# Patient Record
Sex: Female | Born: 1935 | Race: White | Hispanic: No | Marital: Married | Smoking: Former smoker
Health system: Southern US, Community
[De-identification: ages and names within clinical notes are randomized; demographics above are authoritative.]

## PROBLEM LIST (undated history)

## (undated) DIAGNOSIS — K589 Irritable bowel syndrome without diarrhea: Secondary | ICD-10-CM

## (undated) DIAGNOSIS — M353 Polymyalgia rheumatica: Secondary | ICD-10-CM

## (undated) DIAGNOSIS — R002 Palpitations: Secondary | ICD-10-CM

## (undated) DIAGNOSIS — G8929 Other chronic pain: Secondary | ICD-10-CM

## (undated) DIAGNOSIS — I493 Ventricular premature depolarization: Secondary | ICD-10-CM

## (undated) DIAGNOSIS — K5792 Diverticulitis of intestine, part unspecified, without perforation or abscess without bleeding: Secondary | ICD-10-CM

## (undated) DIAGNOSIS — F17200 Nicotine dependence, unspecified, uncomplicated: Secondary | ICD-10-CM

## (undated) DIAGNOSIS — E785 Hyperlipidemia, unspecified: Secondary | ICD-10-CM

## (undated) DIAGNOSIS — I471 Supraventricular tachycardia, unspecified: Secondary | ICD-10-CM

## (undated) DIAGNOSIS — I10 Essential (primary) hypertension: Secondary | ICD-10-CM

## (undated) HISTORY — DX: Irritable bowel syndrome, unspecified: K58.9

## (undated) HISTORY — DX: Palpitations: R00.2

## (undated) HISTORY — PX: REPLACEMENT TOTAL KNEE: SUR1224

## (undated) HISTORY — DX: Polymyalgia rheumatica: M35.3

## (undated) HISTORY — PX: TONSILLECTOMY: SUR1361

## (undated) HISTORY — DX: Essential (primary) hypertension: I10

## (undated) HISTORY — DX: Hyperlipidemia, unspecified: E78.5

## (undated) HISTORY — DX: Nicotine dependence, unspecified, uncomplicated: F17.200

## (undated) HISTORY — DX: Supraventricular tachycardia: I47.1

## (undated) HISTORY — DX: Diverticulitis of intestine, part unspecified, without perforation or abscess without bleeding: K57.92

## (undated) HISTORY — PX: WRIST FRACTURE SURGERY: SHX121

## (undated) HISTORY — DX: Supraventricular tachycardia, unspecified: I47.10

## (undated) HISTORY — PX: NASAL SINUS SURGERY: SHX719

## (undated) HISTORY — DX: Other chronic pain: G89.29

## (undated) HISTORY — DX: Ventricular premature depolarization: I49.3

## (undated) HISTORY — PX: ABDOMINAL HYSTERECTOMY: SHX81

---

## 1999-06-12 ENCOUNTER — Other Ambulatory Visit: Admission: RE | Admit: 1999-06-12 | Discharge: 1999-06-12 | Payer: Self-pay | Admitting: Family Medicine

## 1999-06-15 ENCOUNTER — Encounter: Payer: Self-pay | Admitting: Family Medicine

## 1999-06-15 LAB — CONVERTED CEMR LAB: Pap Smear: NORMAL

## 1999-06-23 ENCOUNTER — Encounter: Payer: Self-pay | Admitting: Family Medicine

## 1999-06-23 ENCOUNTER — Encounter: Admission: RE | Admit: 1999-06-23 | Discharge: 1999-06-23 | Payer: Self-pay | Admitting: Family Medicine

## 2000-06-24 ENCOUNTER — Encounter: Payer: Self-pay | Admitting: Family Medicine

## 2000-06-24 ENCOUNTER — Encounter: Admission: RE | Admit: 2000-06-24 | Discharge: 2000-06-24 | Payer: Self-pay | Admitting: Family Medicine

## 2000-09-21 ENCOUNTER — Encounter: Payer: Self-pay | Admitting: Family Medicine

## 2000-09-21 ENCOUNTER — Encounter: Admission: RE | Admit: 2000-09-21 | Discharge: 2000-09-21 | Payer: Self-pay | Admitting: Family Medicine

## 2001-02-15 ENCOUNTER — Emergency Department (HOSPITAL_COMMUNITY): Admission: EM | Admit: 2001-02-15 | Discharge: 2001-02-15 | Payer: Self-pay | Admitting: *Deleted

## 2001-06-30 ENCOUNTER — Encounter: Admission: RE | Admit: 2001-06-30 | Discharge: 2001-06-30 | Payer: Self-pay | Admitting: Family Medicine

## 2001-06-30 ENCOUNTER — Encounter: Payer: Self-pay | Admitting: Family Medicine

## 2002-07-05 ENCOUNTER — Encounter: Admission: RE | Admit: 2002-07-05 | Discharge: 2002-07-05 | Payer: Self-pay | Admitting: Family Medicine

## 2002-07-05 ENCOUNTER — Encounter: Payer: Self-pay | Admitting: Family Medicine

## 2003-07-09 ENCOUNTER — Encounter: Admission: RE | Admit: 2003-07-09 | Discharge: 2003-07-09 | Payer: Self-pay | Admitting: Family Medicine

## 2003-11-29 ENCOUNTER — Encounter: Admission: RE | Admit: 2003-11-29 | Discharge: 2003-11-29 | Payer: Self-pay | Admitting: Gastroenterology

## 2004-01-08 ENCOUNTER — Encounter: Admission: RE | Admit: 2004-01-08 | Discharge: 2004-01-08 | Payer: Self-pay | Admitting: Family Medicine

## 2004-05-30 ENCOUNTER — Ambulatory Visit (HOSPITAL_COMMUNITY): Admission: RE | Admit: 2004-05-30 | Discharge: 2004-05-30 | Payer: Self-pay | Admitting: Orthopaedic Surgery

## 2004-07-08 ENCOUNTER — Ambulatory Visit: Payer: Self-pay | Admitting: Family Medicine

## 2004-09-02 ENCOUNTER — Encounter: Admission: RE | Admit: 2004-09-02 | Discharge: 2004-09-02 | Payer: Self-pay | Admitting: Family Medicine

## 2004-09-02 ENCOUNTER — Ambulatory Visit: Payer: Self-pay | Admitting: Family Medicine

## 2005-09-30 ENCOUNTER — Emergency Department (HOSPITAL_COMMUNITY): Admission: EM | Admit: 2005-09-30 | Discharge: 2005-09-30 | Payer: Self-pay | Admitting: Family Medicine

## 2005-10-06 ENCOUNTER — Ambulatory Visit: Payer: Self-pay | Admitting: Family Medicine

## 2005-11-24 ENCOUNTER — Ambulatory Visit: Payer: Self-pay | Admitting: Family Medicine

## 2005-12-13 ENCOUNTER — Ambulatory Visit: Payer: Self-pay | Admitting: Gastroenterology

## 2006-01-26 ENCOUNTER — Ambulatory Visit (HOSPITAL_COMMUNITY): Admission: RE | Admit: 2006-01-26 | Discharge: 2006-01-27 | Payer: Self-pay | Admitting: General Surgery

## 2006-03-01 ENCOUNTER — Ambulatory Visit: Payer: Self-pay | Admitting: Family Medicine

## 2006-04-12 ENCOUNTER — Ambulatory Visit: Payer: Self-pay | Admitting: Family Medicine

## 2006-05-02 ENCOUNTER — Encounter: Admission: RE | Admit: 2006-05-02 | Discharge: 2006-05-02 | Payer: Self-pay | Admitting: General Surgery

## 2006-05-16 ENCOUNTER — Ambulatory Visit: Payer: Self-pay | Admitting: Family Medicine

## 2006-05-18 ENCOUNTER — Ambulatory Visit: Payer: Self-pay | Admitting: Family Medicine

## 2006-06-21 ENCOUNTER — Inpatient Hospital Stay (HOSPITAL_COMMUNITY): Admission: RE | Admit: 2006-06-21 | Discharge: 2006-06-24 | Payer: Self-pay | Admitting: Orthopedic Surgery

## 2006-06-22 ENCOUNTER — Ambulatory Visit: Payer: Self-pay | Admitting: Physical Medicine & Rehabilitation

## 2006-09-07 ENCOUNTER — Ambulatory Visit: Payer: Self-pay | Admitting: Family Medicine

## 2006-10-17 ENCOUNTER — Ambulatory Visit: Payer: Self-pay | Admitting: Family Medicine

## 2006-12-27 ENCOUNTER — Ambulatory Visit: Payer: Self-pay | Admitting: Family Medicine

## 2006-12-27 ENCOUNTER — Encounter: Payer: Self-pay | Admitting: Family Medicine

## 2006-12-27 DIAGNOSIS — I10 Essential (primary) hypertension: Secondary | ICD-10-CM

## 2006-12-27 DIAGNOSIS — F17200 Nicotine dependence, unspecified, uncomplicated: Secondary | ICD-10-CM

## 2006-12-27 LAB — CONVERTED CEMR LAB
Ketones, urine, test strip: NEGATIVE
Nitrite: NEGATIVE
Urobilinogen, UA: 0.2

## 2007-01-04 ENCOUNTER — Telehealth: Payer: Self-pay | Admitting: Family Medicine

## 2007-01-06 ENCOUNTER — Encounter: Admission: RE | Admit: 2007-01-06 | Discharge: 2007-01-06 | Payer: Self-pay | Admitting: Family Medicine

## 2007-01-18 ENCOUNTER — Ambulatory Visit: Payer: Self-pay | Admitting: Family Medicine

## 2007-01-19 LAB — CONVERTED CEMR LAB: Creatinine, Ser: 0.8 mg/dL (ref 0.4–1.2)

## 2007-01-23 ENCOUNTER — Ambulatory Visit: Payer: Self-pay | Admitting: Cardiovascular Disease

## 2007-04-06 ENCOUNTER — Telehealth (INDEPENDENT_AMBULATORY_CARE_PROVIDER_SITE_OTHER): Payer: Self-pay | Admitting: *Deleted

## 2007-04-11 ENCOUNTER — Ambulatory Visit: Payer: Self-pay | Admitting: Family Medicine

## 2007-04-12 LAB — CONVERTED CEMR LAB: TSH: 0.47 microintl units/mL (ref 0.35–5.50)

## 2007-05-02 ENCOUNTER — Ambulatory Visit: Payer: Self-pay | Admitting: Family Medicine

## 2007-05-15 ENCOUNTER — Encounter (INDEPENDENT_AMBULATORY_CARE_PROVIDER_SITE_OTHER): Payer: Self-pay | Admitting: *Deleted

## 2007-05-18 ENCOUNTER — Encounter: Payer: Self-pay | Admitting: Family Medicine

## 2007-06-01 ENCOUNTER — Telehealth: Payer: Self-pay | Admitting: Family Medicine

## 2007-06-21 ENCOUNTER — Ambulatory Visit: Payer: Self-pay | Admitting: Family Medicine

## 2007-08-03 ENCOUNTER — Telehealth: Payer: Self-pay | Admitting: Family Medicine

## 2007-09-13 ENCOUNTER — Telehealth: Payer: Self-pay | Admitting: Family Medicine

## 2007-10-03 ENCOUNTER — Ambulatory Visit: Payer: Self-pay | Admitting: Family Medicine

## 2007-10-12 ENCOUNTER — Telehealth: Payer: Self-pay | Admitting: Family Medicine

## 2007-10-13 ENCOUNTER — Ambulatory Visit: Payer: Self-pay | Admitting: Family Medicine

## 2007-10-16 LAB — CONVERTED CEMR LAB
Basophils Relative: 0.3 % (ref 0.0–1.0)
Eosinophils Absolute: 0.1 10*3/uL (ref 0.0–0.6)
Eosinophils Relative: 1 % (ref 0.0–5.0)
HCT: 39.2 % (ref 36.0–46.0)
Lymphocytes Relative: 41.3 % (ref 12.0–46.0)
MCV: 94.6 fL (ref 78.0–100.0)
Neutro Abs: 3 10*3/uL (ref 1.4–7.7)
WBC: 6.2 10*3/uL (ref 4.5–10.5)

## 2007-10-25 ENCOUNTER — Telehealth: Payer: Self-pay | Admitting: Family Medicine

## 2007-11-07 ENCOUNTER — Encounter: Admission: RE | Admit: 2007-11-07 | Discharge: 2007-11-07 | Payer: Self-pay | Admitting: Family Medicine

## 2007-11-07 ENCOUNTER — Ambulatory Visit: Payer: Self-pay | Admitting: Family Medicine

## 2007-11-09 LAB — CONVERTED CEMR LAB
Free T4: 0.6 ng/dL (ref 0.6–1.6)
TSH: 4.76 microintl units/mL (ref 0.35–5.50)

## 2007-12-13 ENCOUNTER — Telehealth: Payer: Self-pay | Admitting: Family Medicine

## 2007-12-26 ENCOUNTER — Ambulatory Visit: Payer: Self-pay | Admitting: Family Medicine

## 2007-12-28 ENCOUNTER — Telehealth: Payer: Self-pay | Admitting: Family Medicine

## 2008-01-26 ENCOUNTER — Telehealth: Payer: Self-pay | Admitting: Family Medicine

## 2008-01-26 ENCOUNTER — Emergency Department (HOSPITAL_COMMUNITY): Admission: EM | Admit: 2008-01-26 | Discharge: 2008-01-26 | Payer: Self-pay | Admitting: Emergency Medicine

## 2008-01-26 ENCOUNTER — Encounter: Payer: Self-pay | Admitting: Family Medicine

## 2008-01-30 ENCOUNTER — Encounter: Payer: Self-pay | Admitting: Family Medicine

## 2008-02-13 ENCOUNTER — Telehealth (INDEPENDENT_AMBULATORY_CARE_PROVIDER_SITE_OTHER): Payer: Self-pay | Admitting: Internal Medicine

## 2008-02-19 ENCOUNTER — Encounter: Payer: Self-pay | Admitting: Family Medicine

## 2008-02-20 ENCOUNTER — Telehealth (INDEPENDENT_AMBULATORY_CARE_PROVIDER_SITE_OTHER): Payer: Self-pay | Admitting: *Deleted

## 2008-02-29 ENCOUNTER — Encounter: Payer: Self-pay | Admitting: Family Medicine

## 2008-03-28 ENCOUNTER — Telehealth (INDEPENDENT_AMBULATORY_CARE_PROVIDER_SITE_OTHER): Payer: Self-pay | Admitting: *Deleted

## 2008-04-11 ENCOUNTER — Telehealth (INDEPENDENT_AMBULATORY_CARE_PROVIDER_SITE_OTHER): Payer: Self-pay | Admitting: *Deleted

## 2008-05-31 ENCOUNTER — Ambulatory Visit: Payer: Self-pay | Admitting: Family Medicine

## 2008-06-17 ENCOUNTER — Telehealth: Payer: Self-pay | Admitting: Family Medicine

## 2008-06-20 ENCOUNTER — Telehealth: Payer: Self-pay | Admitting: Family Medicine

## 2008-07-03 ENCOUNTER — Telehealth: Payer: Self-pay | Admitting: Family Medicine

## 2008-07-10 ENCOUNTER — Telehealth: Payer: Self-pay | Admitting: Family Medicine

## 2008-08-05 ENCOUNTER — Telehealth: Payer: Self-pay | Admitting: Family Medicine

## 2008-10-02 ENCOUNTER — Telehealth (INDEPENDENT_AMBULATORY_CARE_PROVIDER_SITE_OTHER): Payer: Self-pay | Admitting: Internal Medicine

## 2008-10-10 ENCOUNTER — Ambulatory Visit: Payer: Self-pay | Admitting: Family Medicine

## 2008-10-11 ENCOUNTER — Telehealth (INDEPENDENT_AMBULATORY_CARE_PROVIDER_SITE_OTHER): Payer: Self-pay | Admitting: Internal Medicine

## 2008-10-22 ENCOUNTER — Telehealth: Payer: Self-pay | Admitting: Family Medicine

## 2008-11-07 ENCOUNTER — Telehealth: Payer: Self-pay | Admitting: Family Medicine

## 2008-11-11 ENCOUNTER — Ambulatory Visit: Payer: Self-pay | Admitting: Family Medicine

## 2008-11-12 ENCOUNTER — Telehealth: Payer: Self-pay | Admitting: Family Medicine

## 2008-11-12 LAB — CONVERTED CEMR LAB
Free T4: 0.5 ng/dL — ABNORMAL LOW (ref 0.6–1.6)
Glucose, Bld: 92 mg/dL (ref 70–99)
Potassium: 4.3 meq/L (ref 3.5–5.1)
Sodium: 143 meq/L (ref 135–145)
TSH: 3.59 microintl units/mL (ref 0.35–5.50)

## 2008-11-25 ENCOUNTER — Telehealth: Payer: Self-pay | Admitting: Family Medicine

## 2008-12-10 ENCOUNTER — Ambulatory Visit: Payer: Self-pay | Admitting: Family Medicine

## 2008-12-20 ENCOUNTER — Encounter: Payer: Self-pay | Admitting: Family Medicine

## 2008-12-31 ENCOUNTER — Ambulatory Visit: Payer: Self-pay | Admitting: Family Medicine

## 2009-01-04 ENCOUNTER — Encounter: Payer: Self-pay | Admitting: Family Medicine

## 2009-01-09 ENCOUNTER — Telehealth: Payer: Self-pay | Admitting: Family Medicine

## 2009-01-13 ENCOUNTER — Ambulatory Visit: Payer: Self-pay | Admitting: Family Medicine

## 2009-01-14 ENCOUNTER — Telehealth: Payer: Self-pay | Admitting: Family Medicine

## 2009-01-22 ENCOUNTER — Ambulatory Visit: Payer: Self-pay | Admitting: Family Medicine

## 2009-02-17 ENCOUNTER — Telehealth: Payer: Self-pay | Admitting: Family Medicine

## 2009-03-19 ENCOUNTER — Telehealth: Payer: Self-pay | Admitting: Family Medicine

## 2009-04-18 ENCOUNTER — Telehealth: Payer: Self-pay | Admitting: Family Medicine

## 2009-04-23 ENCOUNTER — Telehealth: Payer: Self-pay | Admitting: Family Medicine

## 2009-05-20 ENCOUNTER — Telehealth: Payer: Self-pay | Admitting: Family Medicine

## 2009-05-26 ENCOUNTER — Telehealth: Payer: Self-pay | Admitting: Family Medicine

## 2009-06-04 ENCOUNTER — Ambulatory Visit: Payer: Self-pay | Admitting: Family Medicine

## 2009-06-20 ENCOUNTER — Encounter: Payer: Self-pay | Admitting: Family Medicine

## 2009-06-24 ENCOUNTER — Telehealth: Payer: Self-pay | Admitting: Family Medicine

## 2009-07-07 ENCOUNTER — Ambulatory Visit: Payer: Self-pay | Admitting: Family Medicine

## 2009-08-01 ENCOUNTER — Telehealth: Payer: Self-pay | Admitting: Family Medicine

## 2009-08-08 ENCOUNTER — Telehealth: Payer: Self-pay | Admitting: Family Medicine

## 2009-08-26 ENCOUNTER — Ambulatory Visit: Payer: Self-pay | Admitting: Family Medicine

## 2009-09-10 ENCOUNTER — Ambulatory Visit: Payer: Self-pay | Admitting: Family Medicine

## 2009-09-11 LAB — CONVERTED CEMR LAB
Albumin: 3.9 g/dL (ref 3.5–5.2)
Alkaline Phosphatase: 59 units/L (ref 39–117)
Bilirubin, Direct: 0 mg/dL (ref 0.0–0.3)
Calcium: 9.4 mg/dL (ref 8.4–10.5)
Cholesterol: 207 mg/dL — ABNORMAL HIGH (ref 0–200)
Creatinine, Ser: 0.9 mg/dL (ref 0.4–1.2)
Glucose, Bld: 87 mg/dL (ref 70–99)
HDL: 50.7 mg/dL (ref >39.00)
Phosphorus: 3.8 mg/dL (ref 2.3–4.6)
TSH: 0.11 microintl units/mL — ABNORMAL LOW (ref 0.35–5.50)
Triglycerides: 151 mg/dL — ABNORMAL HIGH (ref 0.0–149.0)

## 2009-09-12 ENCOUNTER — Telehealth: Payer: Self-pay | Admitting: Family Medicine

## 2009-10-01 ENCOUNTER — Telehealth: Payer: Self-pay | Admitting: Family Medicine

## 2009-10-07 ENCOUNTER — Ambulatory Visit: Payer: Self-pay | Admitting: Family Medicine

## 2009-10-10 LAB — CONVERTED CEMR LAB
Free T4: 0.6 ng/dL (ref 0.6–1.6)
TSH: 0.83 microintl units/mL (ref 0.35–5.50)

## 2009-10-27 ENCOUNTER — Telehealth: Payer: Self-pay | Admitting: Family Medicine

## 2009-11-25 ENCOUNTER — Telehealth: Payer: Self-pay | Admitting: Family Medicine

## 2009-12-23 ENCOUNTER — Emergency Department (HOSPITAL_COMMUNITY): Admission: EM | Admit: 2009-12-23 | Discharge: 2009-12-23 | Payer: Self-pay | Admitting: Emergency Medicine

## 2009-12-24 ENCOUNTER — Telehealth: Payer: Self-pay | Admitting: Family Medicine

## 2010-01-02 ENCOUNTER — Encounter: Payer: Self-pay | Admitting: Family Medicine

## 2010-01-15 ENCOUNTER — Telehealth: Payer: Self-pay | Admitting: Family Medicine

## 2010-01-20 ENCOUNTER — Telehealth: Payer: Self-pay | Admitting: Family Medicine

## 2010-02-02 ENCOUNTER — Telehealth: Payer: Self-pay | Admitting: Family Medicine

## 2010-02-03 ENCOUNTER — Telehealth: Payer: Self-pay | Admitting: Family Medicine

## 2010-02-12 ENCOUNTER — Telehealth: Payer: Self-pay | Admitting: Family Medicine

## 2010-03-04 ENCOUNTER — Telehealth: Payer: Self-pay | Admitting: Family Medicine

## 2010-03-24 ENCOUNTER — Telehealth: Payer: Self-pay | Admitting: Family Medicine

## 2010-04-03 ENCOUNTER — Telehealth: Payer: Self-pay | Admitting: Family Medicine

## 2010-04-06 ENCOUNTER — Ambulatory Visit: Payer: Self-pay | Admitting: Family Medicine

## 2010-04-21 ENCOUNTER — Inpatient Hospital Stay (HOSPITAL_COMMUNITY): Admission: RE | Admit: 2010-04-21 | Discharge: 2010-04-23 | Payer: Self-pay | Admitting: Orthopedic Surgery

## 2010-07-06 ENCOUNTER — Ambulatory Visit: Payer: Self-pay | Admitting: Cardiology

## 2010-07-30 ENCOUNTER — Encounter
Admission: RE | Admit: 2010-07-30 | Discharge: 2010-07-30 | Payer: Self-pay | Source: Home / Self Care | Admitting: Orthopedic Surgery

## 2010-08-19 ENCOUNTER — Encounter: Payer: Self-pay | Admitting: Family Medicine

## 2010-08-28 ENCOUNTER — Ambulatory Visit
Admission: RE | Admit: 2010-08-28 | Discharge: 2010-08-28 | Payer: Self-pay | Source: Home / Self Care | Attending: Family Medicine | Admitting: Family Medicine

## 2010-08-28 ENCOUNTER — Other Ambulatory Visit: Payer: Self-pay | Admitting: Family Medicine

## 2010-08-28 DIAGNOSIS — M79609 Pain in unspecified limb: Secondary | ICD-10-CM | POA: Insufficient documentation

## 2010-08-28 LAB — RENAL FUNCTION PANEL
Albumin: 3.8 g/dL (ref 3.5–5.2)
BUN: 16 mg/dL (ref 6–23)
CO2: 29 mEq/L (ref 19–32)
Calcium: 9.6 mg/dL (ref 8.4–10.5)
Chloride: 105 mEq/L (ref 96–112)
Creatinine, Ser: 0.9 mg/dL (ref 0.4–1.2)
GFR: 64.54 mL/min (ref >60.00)
Glucose, Bld: 94 mg/dL (ref 70–99)
Phosphorus: 3.8 mg/dL (ref 2.3–4.6)
Potassium: 5 mEq/L (ref 3.5–5.1)
Sodium: 140 mEq/L (ref 135–145)

## 2010-08-28 LAB — LIPID PANEL
Cholesterol: 250 mg/dL — ABNORMAL HIGH (ref 0–200)
HDL: 55.8 mg/dL (ref >39.00)
Total CHOL/HDL Ratio: 4
Triglycerides: 139 mg/dL (ref 0.0–149.0)
VLDL: 27.8 mg/dL (ref 0.0–40.0)

## 2010-08-28 LAB — T4, FREE: Free T4: 0.54 ng/dL — ABNORMAL LOW (ref 0.60–1.60)

## 2010-08-28 LAB — TSH: TSH: 1.22 u[IU]/mL (ref 0.35–5.50)

## 2010-08-28 LAB — LDL CHOLESTEROL, DIRECT: Direct LDL: 169 mg/dL

## 2010-08-28 LAB — ALT: ALT: 22 U/L (ref 0–35)

## 2010-08-28 LAB — T3, FREE: T3, Free: 3.1 pg/mL (ref 2.3–4.2)

## 2010-08-28 LAB — AST: AST: 16 U/L (ref 0–37)

## 2010-09-22 NOTE — Progress Notes (Signed)
Summary: refill request for meclizine  Phone Note Refill Request Message from:  Fax from Pharmacy  Refills Requested: Medication #1:  ANTIVERT 25 MG  TABS 1/2 to 1 by mouth three times a day as needed dizziness   Last Refilled: 04/23/2009 Faxed request from [address] [phone]  Initial call taken by: Lowella Petties CMA,  October 01, 2009 8:57 AM  Follow-up for Phone Call        px written on EMR for call in  Follow-up by: Judith Part MD,  October 01, 2009 9:20 AM  Additional Follow-up for Phone Call Additional follow up Details #1::        Called to [facility] [address]. Additional Follow-up by: Lowella Petties CMA,  October 01, 2009 11:10 AM    Prescriptions: ANTIVERT 25 MG  TABS (MECLIZINE HCL) 1/2 to 1 by mouth three times a day as needed dizziness  #30 x 5   Entered and Authorized by:   Judith Part MD   Signed by:   Judith Part MD on 10/01/2009   Method used:   Telephoned to ...       [facility]* (retail)       [address]       [place], [place]  [address]       Ph: [phone]       Fax: [phone]   RxID:   [id number]

## 2010-09-22 NOTE — Progress Notes (Signed)
Summary: Hydrocodone APAP 5-500 rx  Phone Note Refill Request Call back at [phone] Message from:  [facility] [phone] on April 03, 2010 12:41 PM  Refills Requested: Medication #1:  VICODIN 5-500 MG TABS 1 -2 by mouth up to three times a day as needed pain.   Last Refilled: 03/04/2010 [facility] electronically request refill on Hydrocodone-APAP 5-500. Last refill date 03/04/10. Dr Milinda Antis is out of office today. Please advise.    Method Requested: Telephone to Pharmacy Initial call taken by: Lewanda Rife LPN,  April 03, 2010 12:42 PM  Follow-up for Phone Call        Rx called to pharmacy, [facility] Follow-up by: Linde Gillis CMA Duncan Dull),  April 03, 2010 1:47 PM    Prescriptions: VICODIN 5-500 MG TABS (HYDROCODONE-ACETAMINOPHEN) 1 -2 by mouth up to three times a day as needed pain  #180 x 0   Entered and Authorized by:   Ruthe Mannan MD   Signed by:   Ruthe Mannan MD on 04/03/2010   Method used:   Telephoned to ...       [facility] (retail)       [address]       [place], [place]  [address]       Ph: [phone]       Fax: [phone]   RxID:   [id number]

## 2010-09-22 NOTE — Progress Notes (Signed)
Summary: Vicodin  Phone Note Refill Request Message from:  [facility] on February 02, 2010 2:36 PM  Refills Requested: Medication #1:  VICODIN 5-500 MG TABS 1 -2 by mouth up to three times a day as needed pain. [facility]  [address] [phone]*   Last Lenox Ahr Date:  01/15/2010   Pharmacy Phone:  [phone]     Method Requested: Telephone to Pharmacy Initial call taken by: Delilah Shan CMA Duncan Dull),  February 02, 2010 2:36 PM

## 2010-09-22 NOTE — Assessment & Plan Note (Signed)
Summary: FEET ARE SWELLING   Vital Signs:  Patient profile:   75 year old female Weight:      239 pounds BMI:     41.17 Temp:     97.5 degrees F oral Pulse rate:   64 / minute Pulse rhythm:   regular BP sitting:   158 / 90  (left arm) Cuff size:   large  Vitals Entered By: Lowella Petties CMA (August 26, 2009 10:10 AM) CC: Feet are swelling   History of Present Illness: here for new leg and foot swelling they started on christmas day  ? what caused it  not eating more salt or salty foods  did eat a lot of sweets   wt is up 14 lb from last visit   bp high at 158/90 today   thyroid has been stable -- no changes in skin or hair or energy level   was stung by a bee 2 weeks ago- on the hand - this is better   no shortness of breath at all and no chest pain   occ palpitation - from anxiety  had a good holiday overall      Allergies: 1)  ! Prilosec 2)  ! * Vicodin  5-500 3)  ! * Hctz 4)  Codeine 5)  Cortisone 6)  * Aleve 7)  Accupril 8)  * Vytorin 9)  Codeine Phosphate (Codeine Phosphate) 10)  Vioxx 11)  Lipitor 12)  * Synthroid 13)  Soma 14)  Augmentin  Past History:  Past Medical History: Last updated: 01/22/2009 Allergic rhinitis COPD Headache Hypothyroidism Osteoarthritis- knees Hypertension obesity smoker intermittent vertigo GERD/dyspepsia pneumonia  - treated outpt 5/10  ortho-- Dr Charlann Boxer cardiol --Dr [place] ENT-- neurol-- Dr Terrace Arabia  Past Surgical History: Last updated: 2007/01/24 Hemorrhoidectomy Hysterectomy Sinus surgery EGD- neg, (10/2000) Colonoscopy-diverticulosis, int. hemorrhoids (10/2000) Abd/ pelvic CT- neg.: cardiomegaly (11/2003) Adenosine cardiolite (12/2003) 2D Echo- inferior wall hypokinesia (12/2003) Deg. disk changes -LS Dexa- osteopenic hip, normal spine (05/2004) Right abd. 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 Eyes:  Denies blurring and eye pain. CV:  Denies chest pain or discomfort, difficulty breathing while lying down, leg cramps with exertion, lightheadness, and palpitations. Resp:  Denies cough and shortness of breath. GI:  Denies abdominal pain and change in bowel habits. MS:  Complains of joint pain and stiffness; denies joint redness and joint swelling. Derm:  Denies itching, lesion(s), poor wound healing, and rash. Neuro:  Denies tingling. Endo:  Denies cold intolerance, excessive thirst, excessive urination, and heat intolerance. Heme:  Denies abnormal bruising and bleeding.  Physical Exam  General:  overweight but generally well appearing  Head:  normocephalic, atraumatic, and no abnormalities observed.   Eyes:  vision grossly intact, pupils equal, pupils round, pupils reactive to light, and no injection.   Neck:  supple with full rom and no masses or thyromegally, no JVD or carotid bruit  Lungs:  diffusely distant bs without crackles or rales   Heart:  Normal rate and regular rhythm. S1 and S2 normal without gallop, murmur, click, rub or other extra sounds. Abdomen:  soft and non-tender.  no renal bruits  Msk:  prominent medial bunions bilat Extremities:  1 plus edema to mid shin bilaterally  no tenderness or palp cords Neurologic:  sensation intact to light touch, gait normal, and DTRs symmetrical and normal.   Skin:  Intact without suspicious lesions or rashes Cervical Nodes:  No lymphadenopathy noted Psych:  normal affect, talkative and pleasant    Impression & Recommendations:  Problem # 1:  ANKLE EDEMA (ICD-782.3) Assessment  New worsened recently without other symptoms , and inc bp  disc watching sodium in diet and leg elevation  will add aldactone to meds -- disc poss side eff f/u 2 weeks for visit and labs  Her updated medication list for this problem includes:    Aldactone 25 Mg Tabs (Spironolactone) .[name]... 1 by mouth each am  Problem # 2:  HYPERTENSION (ICD-401.9) Assessment: Deteriorated worse today- also with swelling  adding aldactone- see above f/u 2 wk rev good lifestyle habits  Her updated medication list for this problem includes:    Bystolic 10 Mg Tabs (Nebivolol hcl) .[name]... Take one tablet by mouth once daily    Aldactone 25 Mg Tabs (Spironolactone) .[name]... 1 by mouth each am  Complete Medication List: 1)  Zoloft 50 Mg Tabs (Sertraline hcl) .... Take one by mouth daily 2)  Benefiber Tabs (Wheat dextrin) .... Take as directed as needed 3)  Tums Chew (Calcium carbonate antacid chew) .... Take 2-4 by mouth daily as needed 4)  Antivert 25 Mg Tabs (Meclizine hcl) .... 1/2 to 1 by mouth three times a day as needed dizziness 5)  Bystolic 10 Mg Tabs (Nebivolol hcl) .... Take one tablet by mouth once daily 6)  Armour Thyroid 60 Mg Tabs (Thyroid) .... Take 1 tablet by mouth every morning 30 minutes before breakfast with 30mg  tablet 7)  Protonix 40 Mg Tbec (Pantoprazole sodium) .[name].. 1 by mouth each am 8)  Armour Thyroid 30 Mg Tabs (Thyroid) .... Take 1 tablet by mouth every morning 30 minutes before breakfast-take with 60mg  tab 9)  Ultram 50 Mg Tabs (Tramadol hcl) .[name].. 1 by mouth up to three times a day as needed pain 10)  Aldactone 25 Mg Tabs (Spironolactone) .[name].. 1 by mouth each am  Patient Instructions: 1)  start aldactone (spironolactone) 1 pill daily in am- this is for swelling and high blood pressure 2)  if any side effects - stop it and update me  3)  watch salt in diet and make sure to drink enough water  4)  elevate your feet when ever you can  5)  follow up with me in 2 weeks (visit and labs )   Prescriptions: ALDACTONE 25 MG TABS (SPIRONOLACTONE) 1 by mouth each am  #30 x 5   Entered and Authorized by:   Judith Part MD   Signed by:   Judith Part MD on 08/26/2009   Method used:   Print then Give to Patient   RxID:   [id number]   Prior Medications (reviewed today): ZOLOFT 50 MG TABS (SERTRALINE HCL) Take one by mouth daily BENEFIBER  TABS (WHEAT DEXTRIN) Take as directed as needed TUMS  CHEW (CALCIUM CARBONATE ANTACID CHEW) Take 2-4 by mouth daily as needed ANTIVERT 25 MG  TABS (MECLIZINE HCL) 1/2 to 1 by mouth three times a day as needed dizziness BYSTOLIC 10 MG TABS (NEBIVOLOL HCL) take one tablet by mouth once daily ARMOUR THYROID 60 MG TABS (THYROID) TAKE 1 TABLET BY MOUTH EVERY MORNING 30 MINUTES BEFORE BREAKFAST WITH 30MG  TABLET PROTONIX  40 MG TBEC (PANTOPRAZOLE SODIUM) 1 by mouth each am ARMOUR THYROID 30 MG TABS (THYROID) TAKE 1 TABLET BY MOUTH EVERY MORNING 30 MINUTES BEFORE BREAKFAST-TAKE WITH 60MG  TAB ULTRAM 50 MG TABS (TRAMADOL HCL) 1 by mouth up to three times a day as needed pain ALDACTONE 25 MG TABS (SPIRONOLACTONE) 1 by mouth each am Current Allergies (reviewed today): ! PRILOSEC ! * VICODIN  5-500 ! * HCTZ CODEINE CORTISONE * ALEVE ACCUPRIL * VYTORIN CODEINE PHOSPHATE (CODEINE PHOSPHATE) VIOXX LIPITOR * SYNTHROID SOMA AUGMENTIN

## 2010-09-22 NOTE — Progress Notes (Signed)
Summary: needs new scripts  Phone Note Refill Request Call back at Home Phone [phone] Message from:  Patient on February 12, 2010 8:37 AM  Refills Requested: Medication #1:  ARMOUR THYROID 60 MG TABS TAKE 1 TABLET BY MOUTH EVERY MORNING 30 MINUTES BEFORE BREAKFAST WITH 30MG  TABLET  Medication #2:  ALDACTONE 25 MG TABS 1 by mouth each am  Medication #3:  PROTONIX 40 MG TBEC 1 by mouth each am Patient says that she does not need refill at this time, but needs new script [facility] [address] w/ quantity of 90. Her insurance is requiring her to do this.   Initial call taken by: Melody Comas,  February 12, 2010 8:40 AM    Prescriptions: ALDACTONE 25 MG TABS (SPIRONOLACTONE) 1 by mouth each am  #90 x 4   Entered by:   Delilah Shan CMA (AAMA)   Authorized by:   Judith Part MD   Signed by:   Delilah Shan CMA (AAMA) on 02/13/2010   Method used:   Electronically to        [facility] [address] [id number] (retail)       [address]       [place], [place]  [address]       Ph: [phone] or [phone]       Fax: [phone]   RxID:   [id number] PROTONIX 40 MG TBEC (PANTOPRAZOLE SODIUM) 1 by mouth each am  #90 x 4   Entered by:   Delilah Shan CMA (AAMA)   Authorized by:   Judith Part MD   Signed by:   Delilah Shan CMA (AAMA) on 02/13/2010   Method used:   Electronically to        [facility] [address] [id number] (retail)       [address]       [place], [place]  [address]       Ph: [phone] or [phone]       Fax: [phone]   RxID:   [id number] ARMOUR THYROID 60 MG TABS (THYROID) TAKE 1 TABLET BY MOUTH EVERY MORNING 30 MINUTES BEFORE BREAKFAST WITH 30MG  TABLET  #90 tablets x 4   Entered by:   Delilah Shan CMA (AAMA)   Authorized by:   Judith Part MD   Signed by:   Delilah Shan CMA (AAMA) on 02/13/2010   Method used:   Electronically to        [id number] (retail)       [address]       [place], [place]  [address]       Ph: [phone] or [phone]  Fax: [phone]   RxID:   [id number]

## 2010-09-22 NOTE — Progress Notes (Signed)
Summary: refill request for tramadol  Phone Note Refill Request Message from:  [facility]  Refills Requested: Medication #1:  ULTRAM 50 MG TABS 2 by mouth up to three times a day as needed pain   Last Refilled: 10/27/2009 Electronic refill [facility] [address]  Initial call taken by: Lowella Petties CMA,  November 25, 2009 12:53 PM  Follow-up for Phone Call        px written on EMR for call in  Follow-up by: Judith Part MD,  November 25, 2009 1:34 PM  Additional Follow-up for Phone Call Additional follow up Details #1::        Sent electronically. Additional Follow-up by: Lowella Petties CMA,  November 25, 2009 2:24 PM    New/Updated Medications: ULTRAM 50 MG TABS (TRAMADOL HCL) 2 by mouth up to three times a day as needed pain Prescriptions: ULTRAM 50 MG TABS (TRAMADOL HCL) 2 by mouth up to three times a day as needed pain  #180 x 0   Entered and Authorized by:   Judith Part MD   Signed by:   Lowella Petties CMA on 11/25/2009   Method used:   Telephoned to ...       [facility] (retail)       [address]       [place], [place]  [address]       Ph: [phone]       Fax: [phone]   RxID:   [id number]

## 2010-09-22 NOTE — Progress Notes (Signed)
Summary: refill request for vicodin  Phone Note Refill Request Message from:  Fax from Pharmacy  Refills Requested: Medication #1:  VICODIN 5-500 MG TABS 1 -2 by mouth up to three times a day as needed pain.   Last Refilled: 01/15/2010 [facility]ed [facility] [address], phone [phone].  This was requested yesterday but not completed.  Initial call taken by: Lowella Petties CMA,  February 03, 2010 12:36 PM  Follow-up for Phone Call        px written on EMR for call in I increased monthly amount due to dosage change   Follow-up by: Judith Part MD,  February 03, 2010 1:18 PM  Additional Follow-up for Phone Call Additional follow up Details #1::        Rx called to pharmacy Additional Follow-up by: Linde Gillis CMA Duncan Dull),  February 03, 2010 2:04 PM    Prescriptions: VICODIN 5-500 MG TABS (HYDROCODONE-ACETAMINOPHEN) 1 -2 by mouth up to three times a day as needed pain  #180 x 0   Entered and Authorized by:   Judith Part MD   Signed by:   Linde Gillis CMA (AAMA) on 02/03/2010   Method used:   Telephoned to ...       [facility] (retail)       [address]       [place], [place]  [address]       Ph: [phone]       Fax: [phone]   RxID:   [id number]

## 2010-09-22 NOTE — Progress Notes (Signed)
Summary: Fx wrist  Phone Note Call from Patient Call back at Home Phone [phone]   Caller: Patient Call For: Dr.Abagale Boulos Summary of Call: Pt called to cancel her f/u appt. w/ you on Monday.  She fell and broke her wrist yesterday and Dr.Ortmann will be doing surgery on Monday.  She'll call back to r/s her appt..  She was suppose to have knee replacement surgery done by Dr.Olin the end of this month,but she'll have to put that off until her wrist heals. Initial call taken by: Beau Fanny,  Dec 24, 2009 4:20 PM  Follow-up for Phone Call        I'm so sorry to hear that- good luck with the surgery and I will see her after  Follow-up by: Judith Part MD,  Dec 24, 2009 5:04 PM  Additional Follow-up for Phone Call Additional follow up Details #1::        Patient notified as instructed by telephone. Lewanda Rife LPN  Dec 26, 9379 10:55 AM

## 2010-09-22 NOTE — Assessment & Plan Note (Signed)
Summary: MEDICAL CLEARANCE FOR SURGERY/RI   Vital Signs:  Patient profile:   75 year old female Weight:      199 pounds BMI:     34.28 Temp:     98.0 degrees F oral Pulse rate:   60 / minute Pulse rhythm:   regular BP sitting:   124 / 80  (right arm) Cuff size:   large  Vitals Entered By: Sydell Axon LPN (April 06, 2010 12:06 PM) CC: Medical clearance for surgery/knee replacement   History of Present Illness: here for a visit for med clearance for upcoming knee surgery - L TKA- med and lat with patella resurfacing-- Dr Charlann Boxer  august 30th  is excited to get it over with - is in a lot of pain   wt is down 4 lb--is working on it   HTN in good control 124/80- spironolactone helping this and swelling   cardiol visit in may - all was well hx of palpitations   copd - is about the same is back to smoking 1 pack per day  will be having general anethesia-- did fine with that in may with wrist surg   breathing is ok  has not used inhaler at all in 1-2 months   no anethesia problems --or nausea  has had R knee replaced before - knows what to expect  will be going back there for rehab -- at therapy dept  sister will help her at home     some trouble with artifact on EKG machine today- but not acute changes     Allergies: 1)  ! Prilosec 2)  ! * Hctz 3)  Codeine 4)  Cortisone 5)  * Aleve 6)  Accupril 7)  Vioxx 8)  * Vytorin 9)  Codeine Phosphate (Codeine Phosphate) 10)  Lipitor 11)  * Synthroid 12)  Soma 13)  Augmentin  Past History:  Past Medical History: Last updated: 01/22/2009 Allergic rhinitis COPD Headache Hypothyroidism Osteoarthritis- knees Hypertension obesity smoker intermittent vertigo GERD/dyspepsia pneumonia  - treated outpt 5/10  ortho-- Dr Charlann Boxer cardiol --Dr [place] ENT-- neurol-- Dr Terrace Arabia  Past Surgical History: Last updated: Jan 16, 2007 Hemorrhoidectomy Hysterectomy Sinus surgery EGD- neg, (10/2000) Colonoscopy-diverticulosis,  int. hemorrhoids (10/2000) Abd/ pelvic CT- neg.: cardiomegaly (11/2003) Adenosine cardiolite (12/2003) 2D Echo- inferior wall hypokinesia (12/2003) Deg. disk changes -LS Dexa- osteopenic hip, normal spine (05/2004) Right abd. 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 Eyes:  Denies blurring and eye irritation. ENT:  Denies nasal congestion and sore throat. CV:  Denies chest pain or discomfort and palpitations. Resp:  Denies cough, shortness of breath, and wheezing. GI:  Denies abdominal pain and change in bowel habits. GU:  Denies dysuria and urinary frequency. MS:  Complains of joint pain and stiffness; denies muscle weakness. Derm:  Denies itching, lesion(s), poor wound healing, and rash. Neuro:  Denies numbness and tingling. Psych:  mood is ok . Endo:  Denies cold intolerance, excessive thirst, excessive urination, and heat intolerance. Heme:  Denies abnormal bruising and bleeding.  Physical Exam  General:  overweight but generally well appearing  Head:  normocephalic, atraumatic, and no abnormalities observed.   Eyes:  vision grossly intact, pupils equal, pupils round, and pupils reactive to light.  no conjunctival pallor, injection or icterus  Ears:  R ear normal and L ear normal.   Nose:  nares boggy but clear Mouth:  pharynx pink and moist.   Neck:  supple with full rom and no masses or thyromegally, no JVD or carotid bruit  Lungs:  diffusely distant bs - baseline no wheeze- even on forced exp  no sob  Heart:  Normal rate and regular rhythm. S1  and S2 normal without gallop, murmur, click, rub or other extra sounds. Abdomen:  Bowel sounds positive,abdomen soft and non-tender without masses, organomegaly or hernias noted. no renal bruits Msk:  No deformity or scoliosis noted of thoracic or lumbar spine.  no acute joint changes  Pulses:  R and L carotid,radial,femoral,dorsalis pedis and posterior tibial pulses are full and equal bilaterally Extremities:  no cce Neurologic:  sensation intact to light touch, gait normal, and DTRs symmetrical and normal.   Skin:  Intact without suspicious lesions or rashes Cervical Nodes:  No lymphadenopathy noted Inguinal Nodes:  No significant adenopathy Psych:  normal affect, talkative and pleasant    Impression & Recommendations:  Problem # 1:  PREOPERATIVE EXAMINATION (ICD-V72.84) Assessment Comment Only  in pt with hx of copd and pvcs- both stable no prior anesthesia problems (as early as this may-- general anesthesia for wrist surgery) had another long discussion about smoking and risks for surgery/ healing with that  pt is aware this inc her risks and is trying to quit  reviewed cardiology note from may  some artifact on EKGs but overall stable - will send several  do recommend she has cxr baseline at her hosp pre op visit   Orders: EKG w/ Interpretation (93000)  Problem # 2:  HYPERTENSION (ICD-401.9) Assessment: Unchanged  good control on current meds  Her updated medication list for this problem includes:    Bystolic 10 Mg Tabs (Nebivolol hcl) .[name]... Take one tablet by mouth once daily    Aldactone 25 Mg Tabs (Spironolactone) .[name]... 1 by mouth each am  BP today: 124/80 Prior BP: 128/80 (10/07/2009)  Labs Reviewed: K+: 3.9 (09/10/2009) Creat: : 0.9 (09/10/2009)   Chol: 207 (09/10/2009)   HDL: 50.70 (09/10/2009)   TG: 151.0 (09/10/2009)  Orders: EKG w/ Interpretation (93000)  Problem # 3:  COPD (ICD-496) Assessment: Unchanged overall in good control with infrequent need for  proair  again adv strongly to quit smoking  Her updated medication list for this problem includes:    Proair Hfa 108 (90 Base) Mcg/act Aers (Albuterol sulfate) .[name]... 2 puffs up to every 4 hours as needed wheezing  Complete Medication List: 1)  Zoloft 50 Mg Tabs (Sertraline hcl) .... Take one by mouth daily 2)  Benefiber Tabs (Wheat dextrin) .... Take as directed as needed 3)  Tums Chew (Calcium carbonate antacid chew) .... Take 2-4 by mouth daily as needed 4)  Antivert 25 Mg Tabs (Meclizine hcl) .... 1/2 to 1 by mouth three times a day as needed dizziness 5)  Bystolic 10 Mg Tabs (Nebivolol hcl) .... Take one tablet by mouth once daily 6)  Armour Thyroid 60 Mg Tabs (Thyroid) .... Take 1 tablet by mouth every morning 30 minutes before breakfast with 30mg  tablet 7)  Protonix 40 Mg Tbec (Pantoprazole sodium) .[name].. 1 by mouth each am 8)  Aldactone 25 Mg Tabs (Spironolactone) .[name].. 1 by mouth each am 9)  Proair Hfa 108 (90 Base) Mcg/act Aers (Albuterol sulfate) .[name] KitchenMarland KitchenMarland Kitchen  2 puffs up to every 4 hours as needed wheezing 10)  Vicodin 5-500 Mg Tabs (Hydrocodone-acetaminophen) .[name].. 1 -2 by mouth up to three times a day as needed pain  Patient Instructions: 1)  go ahead with your surgery  2)  I will send info to Dr Charlann Boxer along with [facility] 3)  please work as hard as you can to quit smoking  4)  I do not think celebrex is a safe medication for you  5)  I do want them to do a chest x ray on you at preop visit at the hospital  Prescriptions: ANTIVERT 25 MG  TABS (MECLIZINE HCL) 1/2 to 1 by mouth three times a day as needed dizziness  #30 x 5   Entered and Authorized by:   Judith Part MD   Signed by:   Judith Part MD on 04/06/2010   Method used:   Print then Give to Patient   RxID:   [id number]   Current Allergies (reviewed today): ! PRILOSEC ! * HCTZ CODEINE CORTISONE * ALEVE ACCUPRIL VIOXX * VYTORIN CODEINE PHOSPHATE (CODEINE PHOSPHATE) LIPITOR * SYNTHROID SOMA AUGMENTIN

## 2010-09-22 NOTE — Progress Notes (Signed)
Summary: Scratchy throat and runny nose  Phone Note Call from Patient Call back at [phone]   Caller: Patient Call For: Judith Part MD Summary of Call: Pt has scratchy throat and runny nose. No fever, or chest congestion, no cough. Pt wondered if Dr. Milinda Antis could suggest and OTC that would be OK to take with pt's other meds or could she call something in. Pt uses [facility] [address] [phone]  if pharmacy is needed. Pt can be reached at [phone]. Please advise.  Initial call taken by: Lewanda Rife LPN,  September 12, 2009 9:40 AM  Follow-up for Phone Call        chloraseptic throat spray as needed  fluids/ salt water gargle  claritin 10 mg for runny nose f/u if not imp Follow-up by: Judith Part MD,  September 12, 2009 10:09 AM  Additional Follow-up for Phone Call Additional follow up Details #1::        Advised pt. Additional Follow-up by: Lowella Petties CMA,  September 12, 2009 11:06 AM

## 2010-09-22 NOTE — Letter (Signed)
Summary: [facility]   Imported By: Lanelle Bal 01/14/2010 13:12:00  _____________________________________________________________________  External Attachment:    Type:   Image     Comment:   External Document

## 2010-09-22 NOTE — Progress Notes (Signed)
Summary: darvocet  Phone Note From Pharmacy   Caller: [facility] Call For: dr Casten Floren  Summary of Call: electronic refill request for darvocet Initial call taken by: Lowella Petties,  September 13, 2007 11:01 AM  Follow-up for Phone Call        px written on EMR for call in  Follow-up by: Judith Part MD,  September 13, 2007 12:56 PM  Additional Follow-up for Phone Call Additional follow up Details #1::        [facility] Additional Follow-up by: Lowella Petties,  September 13, 2007 2:04 PM    New/Updated Medications: DARVOCET-N 100  TABS (PROPOXYPHENE N-APAP TABS) take 1 by mouth q 6 hours as needed pain   Prescriptions: DARVOCET-N 100  TABS (PROPOXYPHENE N-APAP TABS) take 1 by mouth q 6 hours as needed pain  #60 x 0   Entered and Authorized by:   Judith Part MD   Signed by:   Lowella Petties on 09/13/2007   Method used:   Telephoned to ...         RxID:   [id number]

## 2010-09-22 NOTE — Assessment & Plan Note (Signed)
Summary: ROA BLOOD PRESSURE AND FEET SWELLING CYD   Vital Signs:  Patient profile:   75 year old female Height:      64 inches Weight:      223.50 pounds BMI:     38.50 Temp:     98.7 degrees F oral Pulse rate:   64 / minute Pulse rhythm:   regular BP sitting:   128 / 80  (left arm) Cuff size:   large  Vitals Entered By: Lewanda Rife LPN (October 07, 2009 12:15 PM)  History of Present Illness: here for f/u of HTN and edema and hypothyroidism   swelling continues to be in good control   did dec her armor thyroid for very low tsh checking that today feeling a lot better overall- will check that today   is doing good with her breathing  has cut down a lot overall with smoking -- about 8-9 per day  has not set a quit date    wt is down 4 lb  bp 128/80- very good today   ultram is not working well - and wants to know if she can increase dose  is thinking about knee replacement this summer  knows she will have to quit smoking      Allergies: 1)  ! Prilosec 2)  ! * Vicodin  5-500 3)  ! * Hctz 4)  Codeine 5)  Cortisone 6)  * Aleve 7)  Accupril 8)  * Vytorin 9)  Codeine Phosphate (Codeine Phosphate) 10)  Vioxx 11)  Lipitor 12)  * Synthroid 13)  Soma 14)  Augmentin  Past History:  Past Medical History: Last updated: 01/22/2009 Allergic rhinitis COPD Headache Hypothyroidism Osteoarthritis- knees Hypertension obesity smoker intermittent vertigo GERD/dyspepsia pneumonia  - treated outpt 5/10  ortho-- Dr Charlann Boxer cardiol --Dr [place] [facility]-- neurol-- Dr Terrace Arabia  Past Surgical History: Last updated: Jan 23, 2007 Hemorrhoidectomy Hysterectomy Sinus surgery EGD- neg, (10/2000) Colonoscopy-diverticulosis, int. hemorrhoids (10/2000) Abd/ pelvic CT- neg.: cardiomegaly (11/2003) Adenosine cardiolite (12/2003) 2D Echo- inferior wall hypokinesia (12/2003) Deg. disk changes -LS Dexa- osteopenic hip, normal spine (05/2004) Right abd. 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 Eyes:  Denies blurring and eye irritation. CV:  Denies chest pain or discomfort and palpitations. Resp:  Denies chest pain with inspiration, pleuritic, shortness of breath, and wheezing. GI:  Denies abdominal pain, bloody stools, change in bowel habits, and indigestion. GU:  Complains of incontinence; denies dysuria. MS:  Complains of joint pain, low back pain, mid back pain, and stiffness; denies muscle aches, cramps, and muscle weakness. Derm:  Denies itching, lesion(s), poor wound healing, and rash. Neuro:  Denies numbness and tingling. Psych:  mood is fair. Endo:  Denies cold intolerance, excessive thirst, excessive urination, and heat intolerance. Heme:  Denies abnormal bruising, bleeding, and enlarge lymph nodes.  Physical Exam  General:  overweight but generally well appearing  Head:  normocephalic, atraumatic, and no abnormalities observed.   Eyes:  vision grossly intact, pupils equal, pupils round, and pupils reactive to light.  no conjunctival pallor, injection or icterus  Mouth:  pharynx pink and moist.   Neck:  supple with  full rom and no masses or thyromegally, no JVD or carotid bruit  Lungs:  diffusely distant bs without crackles or rales  scant wheeze on forced exp only  Heart:  Normal rate and regular rhythm. S1 and S2 normal without gallop, murmur, click, rub or other extra sounds. Abdomen:  soft, non-tender, and normal bowel sounds.  no renal bruits  Msk:  prominent medial bunions bilat poor rom  spine and knees with labored gait Pulses:  R and L carotid,radial,femoral,dorsalis pedis and posterior tibial pulses are full and equal bilaterally Extremities:  much imp in edema- none at all today   Neurologic:  sensation intact to light touch, gait normal, and DTRs symmetrical and normal.   Skin:  Intact without suspicious lesions or rashes Cervical Nodes:  No lymphadenopathy noted Psych:  normal affect, talkative and pleasant    Impression & Recommendations:  Problem # 1:  ANKLE EDEMA (ICD-782.3) Assessment Unchanged overall much improved with aldactone - rev labs today commended on good diet and low sodium as well  f/u may as planned  Her updated medication list for this problem includes:    Aldactone 25 Mg Tabs (Spironolactone) .[name]... 1 by mouth each am  Problem # 2:  HYPERTENSION (ICD-401.9) Assessment: Improved  bp in very good control now with addn of aldactone  urged to keep watching salt in diet f/u ma Her updated medication list for this problem includes:    Bystolic 10 Mg Tabs (Nebivolol hcl) .[name]... Take one tablet by mouth once daily    Aldactone 25 Mg Tabs (Spironolactone) .[name]... 1 by mouth each am  BP today: 128/80 Prior BP: 140/92 (09/10/2009)  Labs Reviewed: K+: 3.9 (09/10/2009) Creat: : 0.9 (09/10/2009)   Chol: 207 (09/10/2009)   HDL: 50.70 (09/10/2009)   TG: 151.0 (09/10/2009)  Problem # 3:  HYPOTHYROIDISM (ICD-244.9) Assessment: Improved  feels better after cutting dose of armor thyroid recently lab today and advise  The following medications were removed from the medication list:    Armour Thyroid 30 Mg Tabs (Thyroid) .[name]... Take 1 tablet by mouth every morning 30 minutes before breakfast-take with 60mg  tab Her updated medication list for this problem includes:    Armour Thyroid 60 Mg Tabs (Thyroid) .[name]... Take 1 tablet by mouth every morning 30 minutes before breakfast with 30mg  tablet  Orders: Venipuncture (78295) TLB-TSH (Thyroid Stimulating  Hormone) (84443-TSH) TLB-T3 Uptake (84479-T3UP) TLB-T4 (Thyrox), Free [phone])  Labs Reviewed: TSH: 0.11 (09/10/2009)    Chol: 207 (09/10/2009)   HDL: 50.70 (09/10/2009)   TG: 151.0 (09/10/2009)  Problem # 4:  OSTEOARTHRITIS (ICD-715.90) Assessment: Deteriorated severe chronic pain - in need of knee repl  was ok on darvocet until removed from market  now on ultram - not as effective  will inc dose to 2 three times a day if needed - watching for sedation for plan to have surg this summer Her updated medication list for this problem includes:    Ultram 50 Mg Tabs (Tramadol hcl) .[name]... 1 by mouth up to three times a day as needed pain  Problem # 5:  Hx of TOBACCO ABUSE (ICD-305.1) Assessment: Improved discussed in detail risks of smoking, and possible outcomes including COPD, vascular dz, cancer and also respiratory infections/sinus problems  pt aware and voiced understanding  has done well cutting down and plans to quit before summer/ knee repl  Complete Medication List: 1)  Zoloft 50 Mg Tabs (Sertraline hcl) .... Take one by mouth daily 2)  Benefiber Tabs (Wheat dextrin) .... Take  as directed as needed 3)  Tums Chew (Calcium carbonate antacid chew) .... Take 2-4 by mouth daily as needed 4)  Antivert 25 Mg Tabs (Meclizine hcl) .... 1/2 to 1 by mouth three times a day as needed dizziness 5)  Bystolic 10 Mg Tabs (Nebivolol hcl) .... Take one tablet by mouth once daily 6)  Armour Thyroid 60 Mg Tabs (Thyroid) .... Take 1 tablet by mouth every morning 30 minutes before breakfast with 30mg  tablet 7)  Protonix 40 Mg Tbec (Pantoprazole sodium) .[name].. 1 by mouth each am 8)  Ultram 50 Mg Tabs (Tramadol hcl) .[name].. 1 by mouth up to three times a day as needed pain 9)  Aldactone 25 Mg Tabs (Spironolactone) .[name].. 1 by mouth each am 10)  Proair Hfa 108 (90 Base) Mcg/act Aers (Albuterol sulfate) .... 2 puffs up to every 4 hours as needed wheezing  Patient Instructions: 1)  you can increase the  ultram (tramadol) pain medicine to 2 pills up to three times a day as needed pain  2)  be careful of sedation with this  3)  please use only the amount you need  4)  keep working on quitting smoking 5)  labs today for thyroid - will advise you furthe when labs return  6)  keep appt with me in may Prescriptions: ULTRAM 50 MG TABS (TRAMADOL HCL) 1 by mouth up to three times a day as needed pain  #180 x 3   Entered and Authorized by:   Judith Part MD   Signed by:   Judith Part MD on 10/07/2009   Method used:   Print then Give to Patient   RxID:   [id number]   Current Allergies (reviewed today): ! PRILOSEC ! * VICODIN  5-500 ! * HCTZ CODEINE CORTISONE * ALEVE ACCUPRIL * VYTORIN CODEINE PHOSPHATE (CODEINE PHOSPHATE) VIOXX LIPITOR * SYNTHROID SOMA AUGMENTIN

## 2010-09-22 NOTE — Progress Notes (Signed)
Summary: vicodin dose isnt helping  Phone Note Call from Patient Call back at Home Phone [phone]   Caller: Patient Call For: Judith Part MD Summary of Call: Pt was given vicodin last week for leg pain and she says this is not helping.  She is asking if she can increase the dose.  If not, can she take the tramadol and stop the zoloft?   Initial call taken by: Lowella Petties CMA,  Jan 20, 2010 9:56 AM  Follow-up for Phone Call        can increase the vicodin from 1-2 pills per dose -- but use great caution-- I do not want to make her dizzy or woozy update me on how this works and I wll be aware will need refil earlier  Follow-up by: Judith Part MD,  Jan 20, 2010 11:20 AM  Additional Follow-up for Phone Call Additional follow up Details #1::        Patient notified as instructed by telephone. Lewanda Rife LPN  Jan 20, 2010 1:14 PM     New/Updated Medications: VICODIN 5-500 MG TABS (HYDROCODONE-ACETAMINOPHEN) 1 -2 by mouth up to three times a day as needed pain

## 2010-09-22 NOTE — Assessment & Plan Note (Signed)
Summary: 2 WK FU/CLE   Vital Signs:  Patient profile:   75 year old female Weight:      227 pounds BMI:     39.11 Temp:     97.7 degrees F oral Pulse rate:   60 / minute Pulse rhythm:   regular BP sitting:   140 / 92  (left arm) Cuff size:   large  Vitals Entered By: Lowella Petties CMA (September 10, 2009 11:47 AM)  Serial Vital Signs/Assessments:  Time      Position  BP       Pulse  Resp  Temp     By                     135/80                         Judith Part MD  CC: 2 week follow up   History of Present Illness: here for f/u of edema and HTN  started aldactone last visit  is overall much better than it was (still has a bit of it)  lost 12 lb   is having some more leg cramps since starting the medicine - need to check K   feels a little wheezy / tight  - several hours after she takes it (this is brief) no rash or anything else  it is brief and tolerable   breathing is generally stable - does not generally need an inhaler  had cut down smoking- now depends on the day wants to quit but cannot seem to do it   wt is down 12 lb in 2 weeks   bp better today also 140.92 on first check  thyroid stable in spring  due for check  flu shot--already had one   Allergies: 1)  ! Prilosec 2)  ! * Vicodin  5-500 3)  ! * Hctz 4)  Codeine 5)  Cortisone 6)  * Aleve 7)  Accupril 8)  * Vytorin 9)  Codeine Phosphate (Codeine Phosphate) 10)  Vioxx 11)  Lipitor 12)  * Synthroid 13)  Soma 14)  Augmentin  Past History:  Past Medical History: Last updated: 01/22/2009 Allergic rhinitis COPD Headache Hypothyroidism Osteoarthritis- knees Hypertension obesity smoker intermittent vertigo GERD/dyspepsia pneumonia  - treated outpt 5/10  ortho-- Dr Charlann Boxer cardiol --Dr [place] ENT-- neurol-- Dr Terrace Arabia  Past Surgical History: Last updated: 01/25/2007 Hemorrhoidectomy Hysterectomy Sinus surgery EGD- neg, (10/2000) Colonoscopy-diverticulosis, int. hemorrhoids  (10/2000) Abd/ pelvic CT- neg.: cardiomegaly (11/2003) Adenosine cardiolite (12/2003) 2D Echo- inferior wall hypokinesia (12/2003) Deg. disk changes -LS Dexa- osteopenic hip, normal spine (05/2004) Right abd. 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 Eyes:  Denies blurring and eye irritation. CV:  Denies chest pain or discomfort, lightheadness, palpitations, and shortness of breath with exertion. Resp:  Complains of wheezing; denies cough, pleuritic, and sputum productive. GI:  Denies abdominal pain and change in bowel habits. MS:  Complains of joint pain and stiffness; denies joint redness and joint swelling. Derm:  Denies itching, lesion(s), poor wound healing, and rash. Neuro:  Denies numbness and tingling. Endo:  Denies cold  intolerance, excessive thirst, excessive urination, and heat intolerance. Heme:  Denies abnormal bruising and bleeding.  Physical Exam  General:  overweight but generally well appearing  Head:  normocephalic, atraumatic, and no abnormalities observed.   Eyes:  vision grossly intact, pupils equal, pupils round, pupils reactive to light, and no injection.   Mouth:  pharynx pink and moist.   Neck:  supple with full rom and no masses or thyromegally, no JVD or carotid bruit  Lungs:  diffusely distant bs without crackles or rales  scant wheeze on forced exp only  Heart:  Normal rate and regular rhythm. S1 and S2 normal without gallop, murmur, click, rub or other extra sounds. Abdomen:  soft and non-tender.  no renal bruits  Msk:  prominent  medial bunions bilat Extremities:  much improved edema -- is trace to none today Neurologic:  sensation intact to light touch, gait normal, and DTRs symmetrical and normal.   Skin:  Intact without suspicious lesions or rashes mild exzema in patches on arms and legs - baseline Cervical Nodes:  No lymphadenopathy noted Psych:  normal affect, talkative and pleasant    Impression & Recommendations:  Problem # 1:  ANKLE EDEMA (ICD-782.3) Assessment Improved  much imp with 12 lb wt loss on aldactone  more wheeze lately- ? if related- will watch this carefully  also lab incl renal panel today f/u in 3 mo or earlier if needed  Her updated medication list for this problem includes:    Aldactone 25 Mg Tabs (Spironolactone) .[name]... 1 by mouth each am  Orders: Venipuncture (16109) TLB-Renal Function Panel (80069-RENAL) TLB-Lipid Panel (80061-LIPID) TLB-Hepatic/Liver Function Pnl (80076-HEPATIC) TLB-TSH (Thyroid Stimulating Hormone) (84443-TSH) TLB-BNP (B-Natriuretic Peptide) (83880-BNPR)  Discussed elevation of the legs, use of compression stockings, sodium restiction, and medication use.   Problem # 2:  HYPERTENSION (ICD-401.9) Assessment: Improved  bp significantly imp with addn of aldactone lab today disc low sodium diet and exercise  f/u in 3 mo  Her updated medication list for this problem includes:    Bystolic 10 Mg Tabs (Nebivolol hcl) .[name]... Take one tablet by mouth once daily    Aldactone 25 Mg Tabs (Spironolactone) .[name]... 1 by mouth each am  Orders: Venipuncture (60454) TLB-Renal Function Panel (80069-RENAL) TLB-Lipid Panel (80061-LIPID) TLB-Hepatic/Liver Function Pnl (80076-HEPATIC) TLB-TSH (Thyroid Stimulating Hormone) (84443-TSH)  BP today: 140/92-- re check 135/80 Prior BP: 158/90 (08/26/2009)  Labs Reviewed: K+: 4.3 (11/11/2008) Creat: : 0.8 (11/11/2008)     Problem # 3:  COPD (ICD-496) Assessment: Deteriorated more wheezing lately- will use proair as needed  and keep updated  talked about cutting back down on smoking and eventual quitting  urged to update if worse sob or cough  Her updated medication list for this problem includes:    Proair Hfa 108 (90 Base) Mcg/act Aers (Albuterol sulfate) .[name]... 2 puffs up to every 4 hours as needed wheezing  Complete Medication List: 1)  Zoloft 50 Mg Tabs (Sertraline hcl) .... Take one by mouth daily 2)  Benefiber Tabs (Wheat dextrin) .... Take as directed as needed 3)  Tums Chew (Calcium carbonate antacid chew) .... Take 2-4 by mouth daily as needed 4)  Antivert 25 Mg Tabs (Meclizine hcl) .... 1/2 to 1 by mouth three times a day as needed dizziness 5)  Bystolic 10 Mg Tabs (Nebivolol hcl) .... Take one tablet by mouth once daily 6)  Armour Thyroid 60 Mg Tabs (Thyroid) .... Take 1 tablet by mouth every morning 30 minutes before breakfast with 30mg  tablet  7)  Protonix 40 Mg Tbec (Pantoprazole sodium) .[name].. 1 by mouth each am 8)  Armour Thyroid 30 Mg Tabs (Thyroid) .... Take 1 tablet by mouth every morning 30 minutes before breakfast-take with 60mg  tab 9)  Ultram 50 Mg Tabs (Tramadol hcl) .[name].. 1 by mouth up to three times a day as needed pain 10)  Aldactone 25 Mg Tabs (Spironolactone) .[name].. 1 by mouth each am 11)  Proair Hfa 108 (90 Base) Mcg/act Aers (Albuterol sulfate) .... 2 puffs up to every 4 hours as needed wheezing  Patient Instructions: 1)  continue the aldactone  2)  update me if wheezing increases - and keep working on quitting smoking  3)  use the pro air inhaler as needed for wheezing or shortness of breath  4)  labs today  5)  follow up in about 3 months  Prescriptions: PROAIR HFA 108 (90 BASE) MCG/ACT AERS (ALBUTEROL SULFATE) 2 puffs up to every 4 hours as needed wheezing  #1 mdi x 1   Entered and Authorized by:   Judith Part MD   Signed by:   Judith Part MD on 09/10/2009   Method used:   Print then Give to Patient   RxID:   [id number]

## 2010-09-22 NOTE — Progress Notes (Signed)
Summary: ??? about appt.   Phone Note Call from Patient Call back at Home Phone [phone]   Caller: Patient Call For: Judith Part MD Summary of Call: Patient is having surgery on 04-21-10 and she is asking if she needs to come in for a office visit before her surgery. Please advise.  Initial call taken by: Melody Comas,  March 24, 2010 1:51 PM  Follow-up for Phone Call        I think that is a good idea  bring any papers from her surgeon reqiring medical clearance please Follow-up by: Judith Part MD,  March 24, 2010 2:00 PM  Additional Follow-up for Phone Call Additional follow up Details #1::        I called [phone] (only contact # for pt) no answer and no v/m. Will try again.Lewanda Rife LPN  March 24, 2010 4:58 PM   Patient notified as instructed by telephone. Pt has a 30 min appt for medical clearance on 04/06/10 at 12:15pm. Clydie Braun from Surgeons office is to fax necessary paperwork to Dr Milinda Antis per pt. Pt will ck with Clydie Braun to make sure she sends info. Pt to call back to make sure we received faxed info also.Lewanda Rife LPN  March 25, 2010 9:42 AM

## 2010-09-22 NOTE — Progress Notes (Signed)
Summary: Hydrocodone/Acetaminophen 5/500mg  refill  Phone Note Refill Request Call back at [phone] Message from:  [facility] on March 04, 2010 1:12 PM  Refills Requested: Medication #1:  VICODIN 5-500 MG TABS 1 -2 by mouth up to three times a day as needed pain.   Last Refilled: 02/03/2010 [facility] [phone] electronically request refill for Hydrocodone-Acetaminophen 5-500. Last date refilled was 02/03/10.Please advise.    Method Requested: Telephone to Pharmacy Initial call taken by: Lewanda Rife LPN,  March 04, 2010 1:17 PM  Follow-up for Phone Call        px written on EMR for call in  Follow-up by: Judith Part MD,  March 04, 2010 1:57 PM  Additional Follow-up for Phone Call Additional follow up Details #1::        Rx called to pharmacy, [facility] [address]. Additional Follow-up by: Linde Gillis CMA Duncan Dull),  March 04, 2010 3:20 PM    Prescriptions: VICODIN 5-500 MG TABS (HYDROCODONE-ACETAMINOPHEN) 1 -2 by mouth up to three times a day as needed pain  #180 x 0   Entered and Authorized by:   Judith Part MD   Signed by:   Judith Part MD on 03/04/2010   Method used:   Telephoned to ...       [facility] (retail)       [address]       [place], [place]  [address]       Ph: [phone]       Fax: [phone]   RxID:   [id number]

## 2010-09-22 NOTE — Progress Notes (Signed)
Summary: Needs new script for tramadol  Phone Note Refill Request Call back at Home Phone [phone] Message from:  Patient  Refills Requested: Medication #1:  ULTRAM 50 MG TABS 1 by mouth up to three times a day as needed pain Pt needs new script called to [facility] [address], [phone], saying that pt is to take 2 three times a day.  This was changed at her last office visit.  Initial call taken by: Lowella Petties CMA,  October 27, 2009 10:03 AM  Follow-up for Phone Call        px written on EMR for call in be careful of sedation with this   Follow-up by: Judith Part MD,  October 27, 2009 10:23 AM  Additional Follow-up for Phone Call Additional follow up Details #1::        Patient notified as instructed by telephone. [facility] as instructed. Lewanda Rife LPN  October 27, 4780 1:01 PM     New/Updated Medications: ULTRAM 50 MG TABS (TRAMADOL HCL) 2 by mouth up to three times a day as needed pain Prescriptions: ULTRAM 50 MG TABS (TRAMADOL HCL) 2 by mouth up to three times a day as needed pain  #180 x 0   Entered and Authorized by:   Judith Part MD   Signed by:   Mervin Hack CMA (AAMA) on 10/27/2009   Method used:   Telephoned to ...       [facility] (retail)       [address]       [place], [place]  [address]       Ph: [phone]       Fax: [phone]   RxID:   [id number]

## 2010-09-22 NOTE — Progress Notes (Signed)
Summary: Tramadol with anti-depressant  Phone Note Call from Patient Call back at Home Phone [phone]   Caller: Patient Call For: Judith Part MD Summary of Call: Patient says she has been on Tramadol in the past but has not taken it in some time.  She thinks she is going to have to start taking it again because of her pain level but she is concerned about the warnings of taking Tramadol with an anti-depressant.  Should she be worried or does her medication need to be changed because of the warning? Initial call taken by: Delilah Shan CMA Duncan Dull),  Jan 15, 2010 10:28 AM  Follow-up for Phone Call        the reaction from ultram (tramadol ) and zoloft is very rare (called seretonin syndrome )  is optimal not to take them together - but she has so many med intolerances - I do not know what else to give for pain is there anything else that she knows that works for pain with her? Follow-up by: Judith Part MD,  Jan 15, 2010 12:26 PM  Additional Follow-up for Phone Call Additional follow up Details #1::        Patient Advised.   She says you have given her Vicodin in the past.  However, she will do whatever you think is best.  Delilah Shan CMA Duncan Dull)  Jan 15, 2010 2:11 PM     Additional Follow-up for Phone Call Additional follow up Details #2::    we can try vidocin again -- careful of sedation and habit forming potential  px written on EMR for call in  Follow-up by: Judith Part MD,  Jan 15, 2010 2:17 PM  Additional Follow-up for Phone Call Additional follow up Details #3:: Details for Additional Follow-up Action Taken: Medication phoned to pharmacy. Patient Advised.  Additional Follow-up by: Delilah Shan CMA (AAMA),  Jan 15, 2010 2:18 PM  New/Updated Medications: VICODIN 5-500 MG TABS (HYDROCODONE-ACETAMINOPHEN) 1 by mouth up to three times a day as needed pain Prescriptions: VICODIN 5-500 MG TABS (HYDROCODONE-ACETAMINOPHEN) 1 by mouth up to three times a day as needed  pain  #90 x 0   Entered and Authorized by:   Judith Part MD   Signed by:   Delilah Shan CMA (AAMA) on 01/15/2010   Method used:   Telephoned to ...       [facility]* (retail)       [address]       [place], [place]  [address]       Ph: [phone]       Fax: [phone]   RxID:   [id number]

## 2010-09-24 NOTE — Assessment & Plan Note (Signed)
Summary: FILL OUT FORM FOR INSURANCE/CLE   Vital Signs:  Patient profile:   75 year old female Weight:      198 pounds Temp:     98 degrees F oral Pulse rate:   68 / minute Pulse rhythm:   regular BP sitting:   120 / 70  (right arm) Cuff size:   large  Vitals Entered By: Lowella Petties CMA, AAMA (August 28, 2010 12:13 PM) CC: Complete insurance form   History of Present Illness: here to fill out insurance form  also some questions  has problem with pain in side of leg -- did not imp after knee surgery  Dr Ethelene Hal is trying shots in her back to see if it is radiculopathy will be sending me some records soon   wants me to check her circulation   form is for life insurance -- too fill out  unfortunately - she started smoking again -lightly  her way of dealing with pain and stress    Allergies: 1)  ! Prilosec 2)  ! * Hctz 3)  Codeine 4)  Cortisone 5)  * Aleve 6)  Accupril 7)  Vioxx 8)  * Vytorin 9)  Codeine Phosphate (Codeine Phosphate) 10)  Lipitor 11)  * Synthroid 12)  Soma 13)  Augmentin  Past History:  Past Surgical History: Last updated: 01/25/2007 Hemorrhoidectomy Hysterectomy Sinus surgery EGD- neg, (10/2000) Colonoscopy-diverticulosis, int. hemorrhoids (10/2000) Abd/ pelvic CT- neg.: cardiomegaly (11/2003) Adenosine cardiolite (12/2003) 2D Echo- inferior wall hypokinesia (12/2003) Deg. disk changes -LS Dexa- osteopenic hip, normal spine (05/2004) Right abd. wall hernia- surgery (01/2006)  Family History: Last updated: January 25, 2007 Father: Deceased, MI age 2 Mother:  biological mother unknown Siblings: Half sister with heart problems  Social History: Last updated: 11/11/2008 Marital Status: Married Children: 2 Occupation: Futures trader current smoker  cares for husb- poor health/ amputations  Risk Factors: Smoking Status: current (01/25/07)  Past Medical History: Allergic rhinitis COPD Headache Hypothyroidism Osteoarthritis-  knees Hypertension obesity smoker intermittent vertigo GERD/dyspepsia pneumonia  - treated outpt 5/10 OA-- Dr Charlann Boxer px pain med now 2012  ortho-- Dr Charlann Boxer cardiol --Dr [place] [facility]-- neurol-- Dr Terrace Arabia  Review of Systems General:  Denies fatigue and malaise. Eyes:  Denies blurring and eye irritation. CV:  Denies chest pain or discomfort, palpitations, shortness of breath with exertion, and swelling of feet. Resp:  Denies cough and shortness of breath. GI:  Denies abdominal pain, change in bowel habits, and nausea. GU:  Denies dysuria and urinary frequency. MS:  Complains of joint pain, low back pain, and stiffness; denies joint redness and joint swelling. Derm:  Denies itching, lesion(s), poor wound healing, and rash. Neuro:  Denies numbness and tingling. Psych:  mood is fairly good . Endo:  Denies cold intolerance, excessive thirst, and heat intolerance. Heme:  Denies abnormal bruising and bleeding.  Physical Exam  General:  overweight but generally well appearing  Head:  normocephalic, atraumatic, and no abnormalities observed.   Eyes:  vision grossly intact, pupils equal, pupils round, and pupils reactive to light.  no conjunctival pallor, injection or icterus  Mouth:  pharynx pink and moist.   Neck:  supple with full rom and no masses or thyromegally, no JVD or carotid bruit  Chest Wall:  No deformities, masses, or tenderness noted. Lungs:  diffusely distant bs - baseline no wheeze- even on forced exp  no sob  Heart:  Normal rate and regular rhythm. S1 and S2 normal without gallop, murmur, click, rub or other extra  sounds. Abdomen:  Bowel sounds positive,abdomen soft and non-tender without masses, organomegaly or hernias noted. no renal bruits Msk:  tender over L3 to S1 today with poor lumbar rom  no leg or knee tenderness knee incision healing well  gait is ok  Pulses:  R and L carotid,radial,femoral,dorsalis pedis and posterior tibial pulses are full and equal  bilaterally Extremities:  some varicosities no cce  Neurologic:  sensation intact to light touch, gait normal, and DTRs symmetrical and normal.   Skin:  Intact without suspicious lesions or rashes Cervical Nodes:  No lymphadenopathy noted Inguinal Nodes:  No significant adenopathy Psych:  normal affect, talkative and pleasant    Impression & Recommendations:  Problem # 1:  HYPERTENSION (ICD-401.9)  Her updated medication list for this problem includes:    Bystolic 10 Mg Tabs (Nebivolol hcl) .[name]... Take one tablet by mouth once daily    Aldactone 25 Mg Tabs (Spironolactone) .[name]... 1 by mouth each am  Orders: Venipuncture (16109) TLB-Lipid Panel (80061-LIPID) TLB-Renal Function Panel (80069-RENAL) TLB-TSH (Thyroid Stimulating Hormone) (84443-TSH) TLB-ALT (SGPT) (84460-ALT) TLB-AST (SGOT) (84450-SGOT) TLB-T3, Free (Triiodothyronine) (84481-T3FREE) TLB-T4 (Thyrox), Free [phone])  Problem # 2:  Hx of TOBACCO ABUSE (ICD-305.1) Assessment: Deteriorated unfortunately back to smoking disc strategy for quitting again  disc need for other outlet during stress and coping techniques   Problem # 3:  Hx of HYPERCHOLESTEROLEMIA (ICD-272.0) Assessment: Unchanged  checking that today disc low sat fat diet- and imp of good control  Orders: Venipuncture (19147) TLB-Lipid Panel (80061-LIPID) TLB-Renal Function Panel (80069-RENAL) TLB-TSH (Thyroid Stimulating Hormone) (84443-TSH) TLB-ALT (SGPT) (84460-ALT) TLB-AST (SGOT) (84450-SGOT) TLB-T3, Free (Triiodothyronine) (84481-T3FREE) TLB-T4 (Thyrox), Free [phone])  Labs Reviewed: SGOT: 22 (09/10/2009)   SGPT: 15 (09/10/2009)   HDL:50.70 (09/10/2009)  Chol:207 (09/10/2009)  Trig:151.0 (09/10/2009)  Problem # 4:  LEG PAIN, LEFT (ICD-729.5) Assessment: New chronic L leg pain -- ? poss from radiculopathy or tendonitis not helped by knee replacement  will f/u ortho soon  arterial circulaiton intact some varicosities noted    Complete Medication List: 1)  Zoloft 50 Mg Tabs (Sertraline hcl) .... Take one by mouth daily 2)  Benefiber Tabs (Wheat dextrin) .... Take as directed as needed 3)  Tums Chew (Calcium carbonate antacid chew) .... Take 2-4 by mouth daily as needed 4)  Antivert 25 Mg Tabs (Meclizine hcl) .... 1/2 to 1 by mouth three times a day as needed dizziness 5)  Bystolic 10 Mg Tabs (Nebivolol hcl) .... Take one tablet by mouth once daily 6)  Armour Thyroid 60 Mg Tabs (Thyroid) .... Take 1 tablet by mouth every morning 30 minutes before breakfast 7)  Protonix 40 Mg Tbec (Pantoprazole sodium) .[name].. 1 by mouth each am 8)  Aldactone 25 Mg Tabs (Spironolactone) .[name].. 1 by mouth each am 9)  Proair Hfa 108 (90 Base) Mcg/act Aers (Albuterol sulfate) .... 2 puffs up to every 4 hours as needed wheezing 10)  Vicodin 5-500 Mg Tabs (Hydrocodone-acetaminophen) .[name].. 1 -2 by mouth up to three times a day as needed pain  Patient Instructions: 1)  follow up with Dr Charlann Boxer for leg pain/ tendonitis  2)  I'm glad knee is doing well  3)  keep working on quitting smoking  4)  I will fax your insurance form in  5)  labs today 6)  follow up with me in july    Orders Added: 1)  Venipuncture [36415] 2)  TLB-Lipid Panel [80061-LIPID] 3)  TLB-Renal Function Panel [80069-RENAL] 4)  TLB-TSH (Thyroid Stimulating Hormone) [84443-TSH] 5)  TLB-ALT (SGPT) [84460-ALT] 6)  TLB-AST (SGOT) [84450-SGOT] 7)  TLB-T3, Free (Triiodothyronine) [16109-U0AVWU] 8)  TLB-T4 (Thyrox), Free [98119-JY7W] 9)  Est. Patient Level IV [29562]    Prior Medications: ZOLOFT 50 MG TABS (SERTRALINE HCL) Take one by mouth daily BENEFIBER  TABS (WHEAT DEXTRIN) Take as directed as needed TUMS  CHEW (CALCIUM CARBONATE ANTACID CHEW) Take 2-4 by mouth daily as needed ANTIVERT 25 MG  TABS (MECLIZINE HCL) 1/2 to 1 by mouth three times a day as needed dizziness BYSTOLIC 10 MG TABS (NEBIVOLOL HCL) take one tablet by mouth once daily ARMOUR THYROID 60 MG TABS  (THYROID) TAKE 1 TABLET BY MOUTH EVERY MORNING 30 MINUTES BEFORE BREAKFAST PROTONIX 40 MG TBEC (PANTOPRAZOLE SODIUM) 1 by mouth each am ALDACTONE 25 MG TABS (SPIRONOLACTONE) 1 by mouth each am PROAIR HFA 108 (90 BASE) MCG/ACT AERS (ALBUTEROL SULFATE) 2 puffs up to every 4 hours as needed wheezing VICODIN 5-500 MG TABS (HYDROCODONE-ACETAMINOPHEN) 1 -2 by mouth up to three times a day as needed pain Current Allergies: ! PRILOSEC ! * HCTZ CODEINE CORTISONE * ALEVE ACCUPRIL VIOXX * VYTORIN CODEINE PHOSPHATE (CODEINE PHOSPHATE) LIPITOR * SYNTHROID SOMA AUGMENTIN

## 2010-09-24 NOTE — Op Note (Signed)
Summary: Left L4, L5 Selective nerve root block/[facility]  Left L4, L5 Selective nerve root block/[facility]   Imported By: Maryln Gottron 08/26/2010 09:06:36  _____________________________________________________________________  External Attachment:    Type:   Image     Comment:   External Document

## 2010-09-25 NOTE — Letter (Signed)
Summary: [facility] Pre-op Clearance Form  [facility] Pre-op Clearance Form   Imported By: Beau Fanny 04/06/2010 15:24:38  _____________________________________________________________________  External Attachment:    Type:   Image     Comment:   External Document

## 2010-11-05 LAB — BASIC METABOLIC PANEL
CO2: 24 mEq/L (ref 19–32)
Calcium: 8.4 mg/dL (ref 8.4–10.5)
Creatinine, Ser: 0.64 mg/dL (ref 0.4–1.2)
GFR calc Af Amer: >60 mL/min (ref >60)
GFR calc non Af Amer: >60 mL/min (ref >60)
Glucose, Bld: 111 mg/dL — ABNORMAL HIGH (ref 70–99)

## 2010-11-05 LAB — CBC
MCH: 32 pg (ref 26.0–34.0)
MCHC: 34.3 g/dL (ref 30.0–36.0)
Platelets: 166 10*3/uL (ref 150–400)

## 2010-11-06 LAB — URINALYSIS, ROUTINE W REFLEX MICROSCOPIC
Ketones, ur: NEGATIVE mg/dL
Nitrite: NEGATIVE
Protein, ur: NEGATIVE mg/dL
Urobilinogen, UA: 1 mg/dL (ref 0.0–1.0)

## 2010-11-06 LAB — CBC
HCT: 37.1 % (ref 36.0–46.0)
Hemoglobin: 12.5 g/dL (ref 12.0–15.0)
MCV: 93.2 fL (ref 78.0–100.0)
MCV: 93.7 fL (ref 78.0–100.0)
Platelets: 180 10*3/uL (ref 150–400)
RBC: 3.96 MIL/uL (ref 3.87–5.11)
RDW: 15.5 % (ref 11.5–15.5)
WBC: 6.8 10*3/uL (ref 4.0–10.5)
WBC: 7.3 10*3/uL (ref 4.0–10.5)

## 2010-11-06 LAB — DIFFERENTIAL
Eosinophils Relative: 1 % (ref 0–5)
Lymphocytes Relative: 29 % (ref 12–46)
Lymphs Abs: 2.1 10*3/uL (ref 0.7–4.0)
Monocytes Absolute: 0.4 10*3/uL (ref 0.1–1.0)
Monocytes Relative: 6 % (ref 3–12)

## 2010-11-06 LAB — SURGICAL PCR SCREEN
MRSA, PCR: NEGATIVE
Staphylococcus aureus: NEGATIVE

## 2010-11-06 LAB — BASIC METABOLIC PANEL
BUN: 11 mg/dL (ref 6–23)
BUN: 16 mg/dL (ref 6–23)
Calcium: 9.6 mg/dL (ref 8.4–10.5)
Chloride: 109 mEq/L (ref 96–112)
Creatinine, Ser: 0.6 mg/dL (ref 0.4–1.2)
Creatinine, Ser: 0.61 mg/dL (ref 0.4–1.2)
GFR calc non Af Amer: >60 mL/min (ref >60)

## 2010-11-06 LAB — TYPE AND SCREEN
ABO/RH(D): O NEG
Antibody Screen: NEGATIVE

## 2010-11-06 LAB — PROTIME-INR: Prothrombin Time: 12.7 seconds (ref 11.6–15.2)

## 2010-11-10 LAB — CBC
HCT: 40.3 % (ref 36.0–46.0)
Platelets: 207 10*3/uL (ref 150–400)
WBC: 7.5 10*3/uL (ref 4.0–10.5)

## 2010-11-10 LAB — BASIC METABOLIC PANEL
BUN: 11 mg/dL (ref 6–23)
Creatinine, Ser: 0.9 mg/dL (ref 0.4–1.2)
GFR calc non Af Amer: >60 mL/min (ref >60)
Potassium: 4 mEq/L (ref 3.5–5.1)

## 2010-11-10 LAB — DIFFERENTIAL
Lymphocytes Relative: 27 % (ref 12–46)
Lymphs Abs: 2 10*3/uL (ref 0.7–4.0)
Neutrophils Relative %: 68 % (ref 43–77)

## 2010-12-10 ENCOUNTER — Telehealth: Payer: Self-pay

## 2010-12-10 ENCOUNTER — Telehealth: Payer: Self-pay | Admitting: Cardiology

## 2010-12-10 ENCOUNTER — Inpatient Hospital Stay (HOSPITAL_COMMUNITY)
Admission: AD | Admit: 2010-12-10 | Discharge: 2010-12-11 | DRG: 572 | Disposition: A | Discharge status: Home / Self Care | Payer: 59 | Source: Ambulatory Visit | Attending: Orthopedic Surgery | Admitting: Orthopedic Surgery

## 2010-12-10 DIAGNOSIS — Y92009 Unspecified place in unspecified non-institutional (private) residence as the place of occurrence of the external cause: Secondary | ICD-10-CM

## 2010-12-10 DIAGNOSIS — L02419 Cutaneous abscess of limb, unspecified: Principal | ICD-10-CM | POA: Diagnosis present

## 2010-12-10 DIAGNOSIS — L03119 Cellulitis of unspecified part of limb: Principal | ICD-10-CM | POA: Diagnosis present

## 2010-12-10 DIAGNOSIS — W010XXA Fall on same level from slipping, tripping and stumbling without subsequent striking against object, initial encounter: Secondary | ICD-10-CM | POA: Diagnosis present

## 2010-12-10 DIAGNOSIS — I10 Essential (primary) hypertension: Secondary | ICD-10-CM | POA: Diagnosis present

## 2010-12-10 DIAGNOSIS — IMO0002 Reserved for concepts with insufficient information to code with codable children: Secondary | ICD-10-CM | POA: Diagnosis present

## 2010-12-10 DIAGNOSIS — Z96649 Presence of unspecified artificial hip joint: Secondary | ICD-10-CM

## 2010-12-10 DIAGNOSIS — K219 Gastro-esophageal reflux disease without esophagitis: Secondary | ICD-10-CM | POA: Diagnosis present

## 2010-12-10 LAB — BASIC METABOLIC PANEL
CO2: 24 mEq/L (ref 19–32)
Calcium: 9.5 mg/dL (ref 8.4–10.5)
Chloride: 105 mEq/L (ref 96–112)
GFR calc Af Amer: >60 mL/min (ref >60)
Sodium: 138 mEq/L (ref 135–145)

## 2010-12-10 LAB — SURGICAL PCR SCREEN: MRSA, PCR: NEGATIVE

## 2010-12-10 LAB — CBC
HCT: 40.5 % (ref 36.0–46.0)
MCV: 90 fL (ref 78.0–100.0)
RBC: 4.5 MIL/uL (ref 3.87–5.11)
WBC: 8.7 10*3/uL (ref 4.0–10.5)

## 2010-12-10 NOTE — Telephone Encounter (Signed)
Pam with short stay surgery at [facility] called to get last EKG and chest xray done. Faxed EKG done 04/06/10 and chest xray done08/22/11 to [phone]. Pt having surgery today.

## 2010-12-10 NOTE — Telephone Encounter (Signed)
Needs office note ekg chest xray stress test  Surg.   Fax [phone]

## 2010-12-11 LAB — BASIC METABOLIC PANEL
GFR calc Af Amer: >60 mL/min (ref >60)
GFR calc non Af Amer: >60 mL/min (ref >60)
Glucose, Bld: 89 mg/dL (ref 70–99)
Potassium: 3.5 mEq/L (ref 3.5–5.1)
Sodium: 140 mEq/L (ref 135–145)

## 2010-12-11 LAB — CBC
HCT: 35.9 % — ABNORMAL LOW (ref 36.0–46.0)
Hemoglobin: 11.8 g/dL — ABNORMAL LOW (ref 12.0–15.0)
RDW: 13.6 % (ref 11.5–15.5)
WBC: 8.9 10*3/uL (ref 4.0–10.5)

## 2010-12-11 NOTE — H&P (Signed)
NAMEPALMER, SHOREY             ACCOUNT NO.:  [id number]  MEDICAL RECORD NO.:  [id number]           PATIENT TYPE:  O  LOCATION:  1615                         FACILITY:  [facility]  PHYSICIAN:  Madlyn Frankel. Charlann Boxer, M.D.  DATE OF BIRTH:  01-Aug-1936  DATE OF ADMISSION:  12/10/2010 DATE OF DISCHARGE:                             HISTORY & PHYSICAL   ADMISSION DIAGNOSIS:  Right knee abrasion with prepatellar swelling and cellulitis.  HISTORY OF PRESENT ILLNESS:  Ms. Pernice is seen in the office today. This is a 75 year old female with a history of bilateral total knee replacements.  She had a right knee replaced a while ago.  She is currently about 7 months out from left total knee replacement.  She unfortunately stumbled and fell on her front porch landed on her right knee predominantly and had an abrasion last Friday prior to admission. She did not notice any significant swelling or redness until about Tuesday but came into the office today for evaluation.  She has had no fevers, chills, or night sweats.  She is here mainly to make certain that things were okay.  She has had no other upper extremity trauma or injuries with a fall and no left knee issues, no real pain in the knee, just the swelling and erythema noted.  PAST MEDICAL HISTORY: 1. History of bilateral total knee replacements as noted the HPI. 2. She does have some reflux issues and does not take any anti-     inflammatories. 3. Hypertension. 4. Hypothyroidism. 5. Some depression.  DRUG ALLERGIES:  Sensitivity to the ANTI-INFLAMMATORIES.  CURRENT MEDICATIONS: 1. Protonix 40 mg daily. 2. Aldactone 25 mg daily. 3. Zoloft 50 mg daily. 4. Armour 60 mg daily for thyroid issues. 5. Bystolic 10 mg daily.  SOCIAL HISTORY:  She is married and retired.  Smokes about a pack a day for over 30 years.  She does not use alcoholic beverages.  FAMILY HISTORY:  Coronary artery disease and Alzheimer's.  PHYSICAL EXAMINATION:   GENERAL:  Ms. Heidel was seen and evaluated in the office today.  She walks in today without assistive device.  No wheezing and no shortness of breath on exertion. VITAL SIGNS:  Stable and she did not have a temperature. EXTREMITIES:  Examination of her right knee revealed no swelling in the knee joint but the anterior distal aspect of incision had abrasion with about 3-cm area of erythema around it.  There is no expressible purulence but definitely a widened area about 1 cm x 1 cm in diameter. This area had some exudative types eschar over top of it.  Again, no expressible purulence at this point.  There is a minor warmth around the knee.  She still has full active knee extension.  No palpable defects, full flexion.  Stable ligaments on bilateral knees.  No bruising on the left knee at this point.  RADIOLOGY DATA:  Radiographs in the office, AP standing and standing lateral both of her knees reveal stable implants bilaterally.  No evidence of any patella fracture or injury.  ASSESSMENT:  Status post fall with a right knee abrasion and surrounding erythema with exudative  eschar over top of it.  PLAN:  After reviewing Ms. Jeffreys's current situation, I am going to admit her to hospital where she will get lab workup, EKG, chest x-ray. She is going to be taken to the operating room tonight for an I and D of this right knee.  I discussed the procedure with her.  I will extend her incision proximally and distally and try to close her predominantly after I and D and I will have her stay in the hospital for 1-2 days for IV antibiotics and I will keep her on antibiotics orally until her wound heals.  This is not only goal to clear this current condition but also protect underlying knee replacement.  Risks and benefits and proposed admission were all discussed with her and consent will be obtained based on her discussion today.     Madlyn Frankel Charlann Boxer, M.D.     MDO/MEDQ  D:   12/10/2010  T:  12/10/2010  Job:  [id number]  Electronically Signed by Durene Romans M.D. on 12/11/2010 03:44:43 PM

## 2010-12-11 NOTE — Op Note (Signed)
[name], [name]             ACCOUNT NO.:  [id number]  MEDICAL RECORD NO.:  [id number]           PATIENT TYPE:  O  LOCATION:  1615                         FACILITY[facility]  [facility]  PHYSICIAN:  Madlyn Frankel. Charlann Boxer, M.D.  DATE OF BIRTH:  09/16/1935  DATE OF PROCEDURE:  12/10/2010 DATE OF DISCHARGE:                              OPERATIVE REPORT   PREOPERATIVE DIAGNOSIS:  Status post fall with a right knee abrasion that has resulted in a large anterior knee wound with some associated cellulitis, no evidence of obvious purulence or any infection.  POSTOPERATIVE DIAGNOSIS:  Status post fall with a right knee abrasion that has resulted in a large anterior knee wound with some associated cellulitis, no evidence of obvious purulence or any infection.  FINDINGS: 1. I and D of right knee with sharp incisional debridement of the skin     and subcutaneous tissue followed by a pulse lavage, debridement of     the subcutaneous tissues. 2. Reapproximation of right knee wound primarily.  SURGEON:  Madlyn Frankel. Charlann Boxer, MD  ASSISTANT:  Surgical team.  ANESTHESIA:  General.  SPECIMENS:  None.  COMPLICATIONS:  None.  DRAINS:  None.  TOURNIQUET TIME:  20 minutes at 250 mmHg.  INDICATIONS FOR PROCEDURE:  [name] is a 75 year old patient of mine with a history of bilateral total knee replacement, the most recent on the left knee, who stumbled and fell over a matt on her front portion and landed on her right knee, predominantly she had no other associated injury.  She was seen and evaluated a week after this injury with a 3 x 1.5 cm area over the anterior aspect of the knee with an eschar with minor exudate and associated cellulitis and some underlying swelling versus hematoma.  Given the fact, she had a right total knee replacement.  I felt the best course of action was to address this surgically as opposed to continue to observe it with a concern for nonhealing wound and  subsequent infection leading to infection in her right knee.  After reviewing these risks and benefits and indications of procedure, consent was obtained for above.  PROCEDURE IN DETAIL:  The patient was brought to the operative theater. Once adequate anesthesia was established, she was positioned supine with the right thigh tourniquet placed, preoperative antibiotics, 2 g of Ancef administered, the right lower extremity was prepped and draped in sterile fashion with DuraPrep.  The leg was exsanguinated, tourniquet elevated to 250 mmHg.  Time-out was performed identifying the patient, planned procedure, and extremity.  I identified her previously used anterior midline incision which was a little bit just to lateral side of this larger lesion.  Given the mobility of her soft tissues, I was able to make a elliptical incision over this area and was able to then excise the entire portion of the eschar abraded area.  Once this large skin area was removed, I sharply debrided some of the soft tissues of this area and evacuated underlying hematoma.  There was no signs of infection deep.  At this point, about 1 L to 1.5 L of fluid was pulse lavaged in the  wound further adding to the debridement of the soft tissues and evacuation of hematoma.  Once this was completed, the wound edges appeared to be very stable and clean.  I, thus used 3-0 Vicryl and reapproximated the subcutaneous tissues.  I then used 2-0 nylon on the central area where the skin had been involved with the abrasion and then used staples on the remainder of her skin due to the continued laxity.  The wound was then cleaned, dried, and dressed sterilely with Xeroform and a bulky sterile wrap.  She was then brought to recovery room in stable condition, extubated, tolerating the procedure well.     Madlyn Frankel Charlann Boxer, M.D.     MDO/MEDQ  D:  12/10/2010  T:  12/11/2010  Job:  [id number]  Electronically Signed by Durene Romans M.D.  on 12/11/2010 03:44:47 PM

## 2010-12-14 NOTE — Discharge Summary (Signed)
[name], [name]             ACCOUNT NO.:  [id number]  MEDICAL RECORD NO.:  [id number]           PATIENT TYPE:  O  LOCATION:  1615                         FACILITY:  [facility]  PHYSICIAN:  Madlyn Frankel. Charlann Boxer, M.D.  DATE OF BIRTH:  05-Nov-1935  DATE OF ADMISSION:  12/10/2010 DATE OF DISCHARGE:  12/11/2010                              DISCHARGE SUMMARY   ADMITTING DIAGNOSIS:  Right knee abrasion with surrounding cellulitis with underlying total knee replacement with concern for infection superficially.  DISCHARGE DIAGNOSES: 1. Right knee superficial abrasion with nonhealing wound with concern     for underlying infection based on some surrounding erythema. 2. Status post bilateral total hip replacement. 3. History of hypothyroidism. 4. Hypertension. 5. History of some depression. 6. Reflux disease.  HISTORY OF PRESENT ILLNESS:  [name] is a 75 year old patient of mine who has now had a history of bilateral total hip replacement, the most recent one being her left knee about 7 months out.  She presented after a fall on a brick patio, landing on her right knee.  She had had small abrasion and was watching this in the house for few days but by Tuesday began to notice increase swelling in this larger eschar over the anterior aspect of the knee.  She presented to the office now about a week out from the injury with a 4 cm area of surrounding erythema which appeared to be more of that of an underlying hematoma as well as a 5 x 1.5 cm type abrasion, nonhealing with eschar over top of it.  After reviewing with her the concerns of underlying arthroplasty, concern that the wound continue to break down and if she would develop infection this could inseed into the knee implant and cause significant complications. After reviewing this, I suggested and recommended that she consider I and D of the right knee.  She was subsequently admitted to the hospital.  HOSPITAL COURSE:  She had  measures on December 10, 2010 and subsequently taken to the operating room that afternoon.  She underwent an I and D of the right knee.  Please see dictated operative note for details of the procedure.  At the time the operation, she was noted to have no signs of infection at all.  I had hoped for her to be in the hospital for least 24 hours.  She was transferred to the orthopedic ward where she remained on 24 hours of antibiotics.  Her wound and knee were clean, dry, intact on day #1 with an Ace wrap for pressure.  By postop day #1, she was ready to be discharged home.  DISCHARGE INSTRUCTION:  She is stable at time of discharge.  Discharge instructions written on the pink sheet for dressing change.  She is to keep her Ace wrap on until Sunday and then cover wound with dry gauze. She may shower but she needs to keep her wound completely dry.  She will return to see Dr. Durene Romans at [facility] at [phone] in a 2-week period of time for wound evaluation.  DISCHARGE MEDICATIONS:  As for her discharge medications, she will go home basically  on her home medications of: 1. Aldactone 25 mg q.a.m. 2. Armour Thyroid 60 mg q.a.m. 3. Bystolic 10 mg q.a.m. 4. Colace 100 mg p.o. b.i.d. as needed for constipation. 5. Lidoderm patches as needed. 6. Meclizine 25 mg daily as needed. 7. Protonix 40 mg q.a.m. 8. Tramadol 50 mg 1 to 2 tablets every 6 hours as needed for pain. 9. Zoloft 50 mg q.a.m.  In addition, she is going to go home on Keflex 500 mg q.6 h x10 days for a total of #40.  I will see her back in the office to reassess her wound in a 2-week time period.     Madlyn Frankel Charlann Boxer, M.D.     MDO/MEDQ  D:  12/11/2010  T:  12/12/2010  Job:  [id number]  Electronically Signed by Durene Romans M.D. on 12/14/2010 09:54:56 AM

## 2010-12-15 NOTE — Op Note (Signed)
  NAMETAMBER, BURTCH             ACCOUNT NO.:  [id number]  MEDICAL RECORD NO.:  [id number]           PATIENT TYPE:  I  LOCATION:  1615                         FACILITY:  [facility]  PHYSICIAN:  Madlyn Frankel. Charlann Boxer, M.D.  DATE OF BIRTH:  03-10-1936  DATE OF PROCEDURE:  12/10/2010 DATE OF DISCHARGE:  12/11/2010                              OPERATIVE REPORT   Operative Clarification Note  Dictation is clarification, it is regarding the primary wound closure length.  In order to perform the procedure as outlined above and to excise the noted eschar over the anterior aspect of knee, an incision was made that was 6 cm to 8 cm in length.  Following the excision of the overlying eschar and debridement sharply of the skin and subcutaneous tissues, the 6 cm to 8 cm incision was closed primarily without any remaining openings.     Madlyn Frankel Charlann Boxer, M.D.     MDO/MEDQ  D:  12/14/2010  T:  12/15/2010  Job:  [id number]  Electronically Signed by Durene Romans M.D. on 12/15/2010 04:48:53 PM

## 2010-12-18 ENCOUNTER — Telehealth: Payer: Self-pay | Admitting: *Deleted

## 2010-12-18 MED ORDER — FLUCONAZOLE 150 MG PO TABS: 150.0000 mg | ORAL_TABLET | Freq: Once | ORAL | Status: AC

## 2010-12-18 NOTE — Telephone Encounter (Signed)
Patient says that she fell and hurt her leg and has been taking an antibiotic and she says that now she has a yeast infection. She is asking if she can get something called in to [facility] on [address].

## 2010-12-18 NOTE — Telephone Encounter (Signed)
Patient notified as instructed by telephone. Medication phoned to [facility] [address] [phone] pharmacy as instructed.

## 2010-12-18 NOTE — Telephone Encounter (Signed)
Px written for call in  Please f/u if not improved

## 2011-01-08 NOTE — Op Note (Signed)
[name], Lynn             ACCOUNT NO.:  [id number]   MEDICAL RECORD NO.:  [id number]          PATIENT TYPE:  OIB   LOCATION:  1302                         FACILITY:  [facility]   PHYSICIAN:  Sharlet Salina T. Hoxworth, M.D.DATE OF BIRTH:  [birth year]   DATE OF PROCEDURE:  01/26/2006  DATE OF DISCHARGE:                                 OPERATIVE REPORT   PREOPERATIVE DIAGNOSIS:  Right lower quadrant primary ventral hernia.   POSTOPERATIVE DIAGNOSIS:  Right lower quadrant primary ventral hernia.   SURGICAL PROCEDURE:  Laparoscopy and open repair of right lower quadrant  ventral hernia with mesh.   SURGEON:  Lorne Skeens. Hoxworth, M.D.   ASSISTANT:  None.   ANESTHESIA:  General.   BRIEF HISTORY:  [name] is a 75 year old white female who presents  with an increasingly painful and enlarging bulge in her lower right side of  her abdomen.  CT scan was obtained by her primary physician which revealed a  ventral hernia which was felt likely to be spigelian presenting in the right  lower quadrant containing terminal ileum.  Examination reveals a reducible  hernia in the right lower quadrant just above the inguinal area that did  contain bowel but was reducible.  I have recommended proceeding with repair.  Laparoscopic repair was discussed and accepted.  The indications for the  procedure, its nature, the risks of bleeding, infection, recurrence and  possible need for open procedure were discussed and understood.  She is now  brought to the operating room for this procedure.   DESCRIPTION OF PROCEDURE:  The patient was taken to the operating room and  placed in the supine position on the operating table.  General endotracheal  anesthesia was induced.  She received broad spectrum antibiotics  preoperatively.  The abdomen was widely sterilely prepped and draped.  The  correct patient and procedure were verified preoperatively.  Abdominal  access was obtained with an 11 mm OptiVu trocar  in the left upper quadrant  without difficulty and pneumoperitoneum established.  Under direct vision, 5  mm trocars were placed in the left mid abdomen and left lower quadrant.  With the patient in Trendelenburg and rotated toward the left, the hernia  defect was obvious in the right lower quadrant.  It actually was quite low  and lateral in the abdomen.  There were some adhesions of the cecum up to  the edge of the hernia which were filmy and easily taken down with sharp  dissection.  The hernia laterally was seen to extend quite lateral in the  abdomen really running up adjacent to the external iliac vessels.  I did  clear some of the peritoneum and retroperitoneal fascia off the lateral and  inferior hernia margin and examination of these margins and tried to palpate  a trocar through the abdominal wall lateral and inferior to the defect.  It  was apparent that this was really down so close to the inguinal area that  there was no opportunity for a transabdominal fixation laterally or  inferiorly.  I felt that without this, we really could not securely affix a  piece of mesh to this good sized hernia defect and that open repair was much  more likely to be successful.  Therefore, the CO2 was evacuated and I made  an oblique incision along the right groin extending over the inguinal area  and then out more superior and lateral.  The hernia defect intra-abdominal  laparoscopically could be seen to be just superior and lateral to the  internal ring which was clearly identified and was separate from the  internal ring which was just slightly dilated.  Dissection was carried down  through subcutaneous tissue and the external oblique was identified and  divided along the lines of its fibers through the external ring and then up  toward the iliac crest.  At this point, there was a large hernia sac which  was distended from the previous laparoscopy.  The hernia sac was dissected  free from  surrounding structures.  I opened the hernia sac and it contained  no adherent bowel as had been confirmed laparoscopically.  The round  ligament was identified at the pubic tubercle and it was divided at this  point between clamps and tied with 2-0 Vicryl.  The remnant was then traced  back to the internal ring which was actually separate from the hernia defect  and was just medial and inferior to the large hernia defect but clearly the  defect was separate from the internal ring.  The round ligament was clamped,  divided and ligated at the internal ring.  The hernia sac was then opened,  was excised down to the level of the internal oblique, and then was closed  with a running 2-0 Prolene suture.  I chose a piece of Prolene mesh, 15 x 15  cm, then trimmed this to about 8-10 x 15 cm and this was used as an onlay  over the defect deep to the external oblique similar to typical repair for  an inguinal hernia but this extended quite a bit the superior and lateral to  this all the way out to the anterior superior iliac spine to allow coverage  of the defect.  The mesh was sutured initially to the tubercle and then to  the inguinal ligament with running 2-0 Prolene working this all the way out  to the anterior superior iliac spine.  The medial and superior borders of  the mesh were then sutured with interrupted 2-0 Prolene to the edge of the  rectus sheath and internal oblique, apparent nice broad coverage of the  defect.  The wound was irrigated and complete hemostasis assured.  The  external oblique was closed over the mesh with running 3-0 Vicryl.  Scarpa's  fascia was closed with running 3-0 Vicryl.  The skin was closed with running  subcuticular 4-0 Monocryl and Steri-Strips.  The trocar sites were closed  with subcuticular 4-0 Monocryl and Steri-Strips.  Sponge and needle counts were correct.  Dry, sterile dressings were applied.  The patient was taken  to the recovery room in stable  condition.      Lorne Skeens. Hoxworth, M.D.  Electronically Signed     BTH/MEDQ  D:  01/26/2006  T:  01/26/2006  Job:  [id number]

## 2011-01-08 NOTE — H&P (Signed)
[name], [name]             ACCOUNT NO.:  [id number]   MEDICAL RECORD NO.:  [id number]          PATIENT TYPE:  INP   LOCATION:  NA                           FACILITY:  [facility]   PHYSICIAN:  Madlyn Frankel. Charlann Boxer, M.D.  DATE OF BIRTH:  [birth year]   DATE OF ADMISSION:  06/21/2006  DATE OF DISCHARGE:                                HISTORY & PHYSICAL   CHIEF COMPLAINT:  Right knee pain.   HISTORY OF PRESENT ILLNESS:  The patient is a 75 year old female who has  pain in both knees.  The pain has been greater in the right versus the left  and states that she has had the problem over the past two years.  She has  had increasing discomfort and has found that her knee is beginning to give  out, lock, and catch.  She has a difficult time straightening her knee.  She  has tried some anti-inflammatories without significant relief and cannot  tolerate a lot due to stomach sensitivity.   PAST MEDICAL HISTORY:  1. Osteoarthritis.  2. Hypertension.  3. Hypothyroidism.  4. Reflux disease.   PAST SURGICAL HISTORY:  Hysterectomy in 1970, tonsillectomy in 1980, and  inguinal hernia repair in June 2007.   SOCIAL HISTORY:  The patient is married to husband, [name].  She states she  smokes one pack cigarettes per day.   ALLERGIES:  She is allergic to CODEINE.  The patient cannot take ASPIRIN.   MEDICATIONS:  Atenolol 25 mg, Zoloft 50 mg, HCTZ 12.5 mg, Omeprazole 20 mg,  and Armour Thyroid 90 mg.   REVIEW OF SYSTEMS:  In the past two weeks, the patient has had no new onset  of any chest pain, shortness of breath, abdominal pain, diarrhea,  constipation, genitourinary symptoms, headache, syncope, or musculoskeletal  pains.   PHYSICAL EXAMINATION:  VITAL SIGNS:  Temperature 98.1, pulse 76, respirations 18, blood pressure  128/92.  GENERAL:  The patient is awake, alert, oriented, uses a walker for  ambulation.  She is well developed, well nourished.  NECK:  Supple, no lymphadenopathy, no carotid  bruits noted.  CHEST:  Lungs clear to auscultation bilaterally.  No rhonchi, rales, or  wheezes.  BREASTS:  Deferred.  HEART:  Regular rate and rhythm, no gallops, clicks, rubs, or murmurs.  ABDOMEN:  Soft, nontender, nondistended, bowel sounds present in all four  quadrants.  GENITOURINARY:  Deferred.  EXTREMITIES:  The patient uses a walker.  She has a valgus deformity and an  antalgic gait.  Limited range of motion due to bony deformity.  It is  globally tender.  SKIN:  No rashes, no sores, no open wounds.  NEUROLOGICAL:  Distal sensibility is intact as well as dorsalis pedes pulse  positive.   LABORATORY DATA:  Labs at preoperative appointment at [facility] on October  25.  X-rays have been taken of the right knee, AP and lateral.   IMPRESSION:  She has right knee end stage osteoarthritis with bone on bone  deformity, lateral greater than medial.   PLAN:  Right total knee arthroplasty to be performed by Dr. Durene Romans.  The risks and complications were discussed and reviewed with the patient.  Questions were encouraged, answered, and reviewed with the patient.  The  patient is planning on returning to her home after surgery and will require  home health care at that time.     ______________________________  [name] [address], [place]      Madlyn Frankel. Charlann Boxer, M.D.  Electronically Signed    BLM/MEDQ  D:  06/08/2006  T:  06/09/2006  Job:  [id number]

## 2011-01-08 NOTE — Discharge Summary (Signed)
[name], [name]             ACCOUNT NO.:  [id number]   MEDICAL RECORD NO.:  [id number]          PATIENT TYPE:  INP   LOCATION:  1508                         FACILITY:  [facility]   PHYSICIAN:  Madlyn Frankel. Charlann Boxer, M.D.  DATE OF BIRTH:  02/05/1936   DATE OF ADMISSION:  06/21/2006  DATE OF DISCHARGE:  06/24/2006                               DISCHARGE SUMMARY   ADMITTING DIAGNOSES:  1. Osteoarthritis.  2. Hypertension.  3. Hypothyroidism.  4. Hypokalemia.   DISCHARGE DIAGNOSES:  1. Osteoarthritis.  2. Hypertension.  3. Hypothyroidism.  4. Hypokalemia.   PROCEDURE:  Right total knee replacement by surgeon, Dr. Durene Romans.   FIRST ASSISTANT:  Dwyane Luo, PA-C.   CONSULTS:  None.   BRIEF HISTORY:  [name] is a very pleasant 75 year old female who  is well-known in our office, seen for end-stage degenerative changes in  her right knee.  She has failed conservative measures of injections,  anti-inflammatories, activity modification, and her quality of life has  significantly diminished.  She has elected to go ahead with a total knee  replacement.   LABS:  Admission labs show a complete blood count on October 25:  hemoglobin 14.4, hematocrit 43.1.  October 31 hemoglobin 10.5,  hematocrit 30.8.  On November 1 hemoglobin 9.9, hematocrit 29.5.  White  cell differential preadmission within normal limits, upon discharge  within normal limits.  Preadmission COAG tests within normal limits.  Routine chemistries preadmission on October 25 showed sodium 138,  potassium 4, glucose 109 tracked serially throughout her course  prior  to discharge June 24, 2006, her sodium was 133, her potassium was  2.9, glucose 105.  Preadmission urinalysis negative.   CHEST X-RAY:  Chest 2-view shows one minimal linear atelectasis.  There  is scar in left lung base.  Otherwise no active disease.   EKG shows a borderline ECG.   HOSPITAL COURSE:  Patient tolerated procedure well.  Was admitted  to  orthopedic floor.  patient was well controlled with analgesia.  The  patient remained neuromuscularly and vascularly intact throughout the  course of stay.  Hemovac was pulled on day 1.  Dressing was changed on  postop day 2.  Patient was able to ambulate with use of a rolling walker  by postop day 1, at which point she was able to walk 150 feet.  She  progressed nicely throughout her stay.  Was able to walk over 200 feet  by postop day number 2.  During her course of stay, she did have a  course of hypotension, at which time we withheld her blood pressure  medication so her systolic rose up above 100.  Which it did so by postop  day number 2.  She did have a low potassium reading which we replenished  with K-Dur 40 mEq.  We continued this course throughout her stay.  Upon  discharge she was stable, ready to go home, and we did ask that  __________ recheck her potassium 7 days from her date of discharge.   DISCHARGE/DISPOSITION:  Stable, improved.  Discharged home.   DISCHARGE DIET:  Regular as  tolerated by patient.   DISCHARGE MEDICATIONS:  1. Atenolol 25 mg p.o. q.a.m.  2. Hydrochlorothiazide 12.5 mg p.o. q.a.m.  3. Sertraline 50 mg p.o. q.a.m.  4. Omeprazole 20 mg p.o. q.a.m.  5. Armour Thyroid 60 mg 1-1/2 tabs q.a.m.  6. Lovenox 30 mg 1 subcu q.12 x11 days.  7. Robaxin 500 mg 1 to 2 p.o. q.4 to 6 p.r.n. muscle spasm pain.  8. Percocet 5/325 one to two p.o. q.4 to 6 p.r.n. pain.  9. Norco 5/325 one to two p.o. q.4 to 6 p.r.n. pain after course of      Percocet runs out.  10.Colace 100 mg 1 p.o. b.i.d.  11.Iron 325 x3 daily x3 weeks as tolerated by patient.  12.Reglan 10 mg 1 p.o. q.30 minutes before meals q.i.d.  13.K-Dur 40 mEq p.o. daily x7 days.   DISCHARGE PLAN:  1. Physical therapy.  Please continue physical therapy to maximize      range of motion, minimize pain, improve muscle strength __________      perform activities of daily living.  We will have to make sure  we      help work on upper body strength, proprioception as well as gait      training.  2. Wound care.  Keep wound dry.  May shower, however, keep it      bandaged.  Replace the dressing on a daily basis as needed.  3. In the event of acute shortness of breath and severe calf pain,      please call emergency services immediately.  4. Followup appointment with Dr. Charlann Boxer, [phone], in approximately 10      days for standard postop check.     ______________________________  [name]. [address], [place]      Madlyn Frankel. Charlann Boxer, M.D.  Electronically Signed    BLM/MEDQ  D:  07/21/2006  T:  07/21/2006  Job:  [id number]

## 2011-01-08 NOTE — Op Note (Signed)
[name], [name]             ACCOUNT NO.:  [id number]   MEDICAL RECORD NO.:  [id number]          PATIENT TYPE:  INP   LOCATION:  1508                         FACILITY:  [facility]   PHYSICIAN:  [name], M.D.  DATE OF BIRTH:  1935/10/02   DATE OF PROCEDURE:  06/21/2006  DATE OF DISCHARGE:                                 OPERATIVE REPORT   PREOPERATIVE DIAGNOSIS:  Right knee end-stage osteoarthritis.   POSTOPERATIVE DIAGNOSIS:  Right knee end-stage osteoarthritis.   PROCEDURE:  Right total knee replacement.   COMPONENTS USED:  A DePuy rotating platform, posterior stabilized knee  system with a size 3 femur, 2-1/2 tibia, size 10 x 3 rotating platform  posterior stabilized insert and size 35 patellar button.   SURGEON:  [name], M.D.   ASSISTANT:  Dwyane Luo, PA-C   ANESTHESIA:  Duramorph spinal.   DRAINS:  Times one.   TOURNIQUET TIME:  50 minutes at 250 mmHg.   COMPLICATIONS:  None.   INDICATIONS:  [name] is a very pleasant 75 year old female seen in  the office for end-stage degenerative changes.  She had failed conservative  measures of injections, anti-inflammatories, activity modification, quality  of life had significantly diminished.  Reviewed the risks and benefits of  infection, DVT, component failure, need for revision, instability.  She  wished to proceed with the right total knee replacement first given the fact  that both knees were very bad.  Consent was obtained.   PROCEDURE IN DETAIL:  The patient was brought to the operative theater.  Once adequate anesthesia and preoperative antibiotics, 1 gram of Ancef, were  administered the patient was positioned supine with a proximal thigh  tourniquet placed on the right thigh.  The right lower extremity prepped and  draped in sterile fashion following pre scrub.  Midline incision was made  followed by median parapatellar arthrotomy.  This allowed for patella  subluxation rather than  eversion.   Knee exposure obtained.  The patient noted to have a valgus knee.  She had  about a 15 degree valgus, 5 degrees flexion deformity.   Following exposure of the knee which was relatively easy due the soft tissue  laxity and mobility, attention was directed to the femur.  Intramedullary  canal was opened with the drill, irrigated, and suctioned dried in order to  prevent fat emboli.  Intramedullary rod was then passed and at 5 degrees of  valgus I resected 11 mm of bone off the distal femur.  This did allow for a  small cut on the distal femur.  No augments necessary.   I then sized the femur to be a size three.  It was slightly hypoplastic  posterior lateral and for that reason there was slight internal rotation, so  I externally rotated by dropping lateral hole down posterior little bit.   The anterior-posterior and chamfer cuts were then made.  This was done  without complication.  No notching.   The trochlear box cut was then made based off the lateral aspect the distal  femur.  Attention now was directed the tibia.  Tibia  exposure was obtained  with tibial subluxation not dislocation.  Meniscectomies carried out.   Given the patient's laxity and the valgus nature of her knee, I chose to  resect only 10 mm of bone off the medial proximal tibia as her tibia was  relatively symmetric with no significant deficit.  This cut was made without  difficulty, debridement carried out as necessary.   A size 2-1/2 tibial tray fit best in order to externally rotate the  component on the surface of the tibia to match the medial third of the  tibial tubercle.   An alignment rod was passed and passed to the center of the ankle in AP and  lateral planes.  Given this cut I was satisfied, I went ahead and tried the  spacer block.  Spacer block with 10 mm spacer block came out with the knee  in full extension with no laxity medially.   Given these findings, I went ahead and prepared the  proximal tibia with the  size 2-1/2  tibia.  The tibia was drilled, keel punched in the correct  orientation.   Trial components were then carried out with 2-1/2 tibia, 3 femur and 10  spacer.  The knee came out to full extension, was able in extension and  flexion even on the medial side with a size 10 spacer.  Trial components  were left in place, attention was directed to the patella.  Precut  measurement patella was 21 mm.  I resected down to 14 mm and used a 35  patella button.  Post cutting it was about 24 mm.   The patella tracked without application of any thumb pressure.  The knee was  stable.  At this point trial components were removed.  The knee was further  debrided, particularly on the posterior lateral aspect the knee with some  remaining osteophytes and loose bodies with the use of laminar spreaders.  The knee was irrigated normal saline solution pulse lavage, injected with 60  mL of Marcaine with epinephrine and Toradol 1 mL.   Following this, the final components were opened brought to the field  holding insert.   The cement was prepared and when it was ready, the components were cemented  in position with a 2 mm spacer placed.  I was very happy the knee stability  so we went ahead and opened a 10 spacer.   Once cement had cured, excess cement was debrided around the knee  components.   I then placed onto the clean and dried tray the final posterior stabilized  insert.  The knee was reduced and came out to full extension, was stable.  The MCL, LCL were intact and taut with what appeared to be in good balance  the knee.   At this point the wound was irrigated again.  A medium Hemovac drain was  placed deep.  I reapproximated the extensor mechanism with a #1 Vicryl.  I  then reapproximated and the remaining portion of the knee with 2-0 Vicryl  and staples.  I chose to use staples on the skin based on the fact that the  skin was very thin, epidermal layer was very  thin.   The knee was cleaned, dried and dressed sterilely with Adaptic dressing  sponge, tapes and then a bulky dressing.  She was brought to recovery in  stable condition.      Lynn Frankel Charlann Hunter, M.D.  Electronically Signed     MDO/MEDQ  D:  06/21/2006  T:  06/22/2006  Job:  [id number]

## 2011-02-24 ENCOUNTER — Other Ambulatory Visit: Payer: Self-pay | Admitting: Family Medicine

## 2011-02-25 ENCOUNTER — Telehealth: Payer: Self-pay | Admitting: Cardiology

## 2011-02-25 NOTE — Telephone Encounter (Signed)
Received call from patient, she has appt with pain mgmt clinic on 03/22/2011.  Stated she is having palpitations from time to time, but not bad, but would like to come in sooner to see Dr. Swaziland and go over her medications to see if any of them could be interferring with her heart.  Please call.

## 2011-02-26 ENCOUNTER — Telehealth: Payer: Self-pay | Admitting: *Deleted

## 2011-02-26 ENCOUNTER — Other Ambulatory Visit: Payer: Self-pay | Admitting: *Deleted

## 2011-02-26 NOTE — Telephone Encounter (Signed)
Called stating has been having more palp recently. Has started taking oxycodone. Offered her an app w/Lori when Dr. Swaziland is here but says she feels better now and will wait until OV 8/1. Will call us back if has persistent palp.

## 2011-03-08 ENCOUNTER — Encounter: Payer: Self-pay | Admitting: Nurse Practitioner

## 2011-03-08 ENCOUNTER — Ambulatory Visit (INDEPENDENT_AMBULATORY_CARE_PROVIDER_SITE_OTHER): Payer: 59 | Admitting: Nurse Practitioner

## 2011-03-08 ENCOUNTER — Telehealth: Payer: Self-pay | Admitting: Nurse Practitioner

## 2011-03-08 VITALS — BP 106/82 | HR 72 | Ht 67.0 in | Wt 199.4 lb

## 2011-03-08 DIAGNOSIS — R55 Syncope and collapse: Secondary | ICD-10-CM

## 2011-03-08 LAB — BASIC METABOLIC PANEL
BUN: 18 mg/dL (ref 6–23)
CO2: 23 mEq/L (ref 19–32)
Calcium: 9.6 mg/dL (ref 8.4–10.5)
Chloride: 99 mEq/L (ref 96–112)
Creatinine, Ser: 0.9 mg/dL (ref 0.4–1.2)
GFR: 67.88 mL/min (ref >60.00)
Glucose, Bld: 93 mg/dL (ref 70–99)
Potassium: 3.9 mEq/L (ref 3.5–5.1)
Sodium: 134 mEq/L — ABNORMAL LOW (ref 135–145)

## 2011-03-08 LAB — CBC WITH DIFFERENTIAL/PLATELET
Basophils Absolute: 0 K/uL (ref 0.0–0.1)
Basophils Relative: 0.2 % (ref 0.0–3.0)
Eosinophils Absolute: 0 K/uL (ref 0.0–0.7)
Eosinophils Relative: 0.2 % (ref 0.0–5.0)
HCT: 43.7 % (ref 36.0–46.0)
Hemoglobin: 15 g/dL (ref 12.0–15.0)
Lymphocytes Relative: 27.4 % (ref 12.0–46.0)
Lymphs Abs: 1.9 K/uL (ref 0.7–4.0)
MCHC: 34.3 g/dL (ref 30.0–36.0)
MCV: 88.9 fl (ref 78.0–100.0)
Monocytes Absolute: 0.3 K/uL (ref 0.1–1.0)
Monocytes Relative: 4.8 % (ref 3.0–12.0)
Neutro Abs: 4.6 K/uL (ref 1.4–7.7)
Neutrophils Relative %: 67.4 % (ref 43.0–77.0)
Platelets: 230 K/uL (ref 150.0–400.0)
RBC: 4.91 Mil/uL (ref 3.87–5.11)
RDW: 15.6 % — ABNORMAL HIGH (ref 11.5–14.6)
WBC: 6.9 K/uL (ref 4.5–10.5)

## 2011-03-08 LAB — TSH: TSH: 0.53 u[IU]/mL (ref 0.35–5.50)

## 2011-03-08 NOTE — Patient Instructions (Addendum)
We are going to place a monitor to look at your heart rhythm for the next 4 weeks We are going to check your labs today Call for any problems. Call and give Synetta Fail an update on how you are doing before August 1st. We may need to reschedule your appointment with Dr. Swaziland

## 2011-03-08 NOTE — Assessment & Plan Note (Signed)
Her spells are not every day. I have placed a 30 day event monitor. We will also check some labs on her. She has an appointment with Dr. Swaziland for August 1st. If her monitor does not demonstrate any abnormality by then, we will move her appointment out. I explained to her that we need for her to have a recurrent "spell" in order for the monitor to catch it. I am not convinced this is from the Oxycontin. I think she would then be having more spells every day since she takes the drug BID. We will see what the monitor shows.

## 2011-03-08 NOTE — Progress Notes (Signed)
[name] Date of Birth: 01-29-1936   History of Present Illness: [name] is seen today for a work in visit. She is seen for Dr. Swaziland. She is have presyncopal spells for the past 2 weeks. She has a history of PVC's but says this is different. She will feel a flutter in her chest that goes to her head and makes her feel hot and dizzy. She feels like she is going to pass out. She has not had syncope. She was started on OxyContin about 3 weeks ago for her chronic pain. She has "muscle damage" in her left leg. She is cared for by Dr. Vear Clock. These presyncopal spells started 2 weeks ago. She does not have them every day but every few days. They get more intense but not more frequent. She is worried that is is the narcotic causing it but it does not happen with her regular administration of the narcotic. She does continue to smoke but says she is cutting back.   Current Outpatient Prescriptions on File Prior to Visit  Medication Sig Dispense Refill  . ARMOUR THYROID 60 MG tablet TAKE 1 TABLET BY MOUTH EVERY MORNING 30 MINUTES BEFORE BREAKFAST WITH 30MG  TABLET  90 tablet  0  . nebivolol (BYSTOLIC) 10 MG tablet Take 10 mg by mouth daily.        [name] oxyCODONE (OXYCONTIN) 15 MG TB12 Take 15 mg by mouth every 12 (twelve) hours.        . pantoprazole (PROTONIX) 40 MG tablet Take 40 mg by mouth daily.        . sertraline (ZOLOFT) 50 MG tablet Take 50 mg by mouth daily.        [name] spironolactone (ALDACTONE) 25 MG tablet Take 25 mg by mouth daily.        [name] oxycodone-acetaminophen (ROXICET) 5-500 MG per tablet Take 1 tablet by mouth every 4 (four) hours as needed.          Allergies  Allergen Reactions  . Aspirin   . Atorvastatin     REACTION: feel weak  . Carisoprodol     REACTION: intolerant  . Codeine     REACTION: pt. reports allergy to codeine, reaction not known  . Codeine Phosphate     REACTION: UNKNOWN  . Cortisone     REACTION: pt. reports allergy, reaction not known  .  Ezetimibe-Simvastatin     REACTION: feels weak  . NWG:NFAOZHYQMVH+QIONGEXBM+WUXLKGMWNU Acid+Aspartame     REACTION: diarrhea  . Levothyroxine Sodium     REACTION: reaction not known  . Naproxen Sodium     REACTION: GI symptoms  . Omeprazole     REACTION: abd pain  . Quinapril Hcl     REACTION: reports allergy, reaction not known  . Rofecoxib     REACTION: reports allergy, reaction not known    Past Medical History  Diagnosis Date  . Hypertension   . Hyperlipidemia   . PVC's (premature ventricular contractions)   . Tobacco dependence   . IBS (irritable bowel syndrome)   . Diverticulitis   . Palpitations   . Chronic pain     Managed by Dr. Vear Clock    Past Surgical History  Procedure Date  . Tonsillectomy   . Nasal sinus surgery   . Wrist fracture surgery   . Replacement total knee     History  Smoking status  . Current Everyday Smoker -- 0.5 packs/day  Smokeless tobacco  . Not on file  History  Alcohol Use No    Family History  Problem Relation Age of Onset  . Diabetes Mother   . Heart attack Father   . Heart disease Sister     Review of Systems: The review of systems is as above. She also has chills and diarrhea. No actual chest pain. Last nuclear in 2005.  All other systems were reviewed and are negative.  Physical Exam: BP 106/82  Pulse 72  Ht 5\' 7"  (1.702 m)  Wt 199 lb 6.4 oz (90.447 kg)  BMI 31.23 kg/m2 Patient is in no acute distress. She looks chronically ill to me. She is obese. Skin is warm and dry. Color is normal.  HEENT is unremarkable. Normocephalic/atraumatic. PERRL. Sclera are nonicteric. Neck is supple. No masses. No JVD. Lungs are clear. Cardiac exam shows a regular rate and rhythm. She does have an occasional ectopic.  Abdomen is soft. Extremities are without edema. Gait and ROM are intact. No gross neurologic deficits noted.  LABORATORY DATA:  EKG shows sinus with nonspecific ST and T wave changes.   Assessment / Plan:

## 2011-03-08 NOTE — Telephone Encounter (Signed)
Patient requests last visit notes be sent to Dr. Vear Clock at the pain management center.  Dr. Vear Clock [phone], fax [phone].

## 2011-03-09 ENCOUNTER — Encounter: Payer: Self-pay | Admitting: Nurse Practitioner

## 2011-03-11 ENCOUNTER — Encounter: Payer: Self-pay | Admitting: Cardiology

## 2011-03-11 ENCOUNTER — Telehealth: Payer: Self-pay | Admitting: *Deleted

## 2011-03-11 NOTE — Telephone Encounter (Signed)
Notified of lab results. She states she still wants to see Dr. Swaziland 8/1. Will send results to Dr. Milinda Antis.

## 2011-03-11 NOTE — Telephone Encounter (Signed)
Message copied by Lorayne Bender on Thu Mar 11, 2011  4:49 PM ------      Message from: Rosalio Macadamia      Created: Tue Mar 09, 2011 11:09 AM       Ok to report. Labs are satisfactory.

## 2011-03-12 ENCOUNTER — Other Ambulatory Visit: Payer: Self-pay | Admitting: Family Medicine

## 2011-03-15 NOTE — Telephone Encounter (Signed)
[facility] [address] electronically request refill on Protonix. #90 x1 with note pt needs to call for appt.

## 2011-03-23 ENCOUNTER — Telehealth: Payer: Self-pay | Admitting: Cardiology

## 2011-03-23 NOTE — Telephone Encounter (Signed)
Calling about an abnormal EKG, please call Mehgan back

## 2011-03-24 ENCOUNTER — Encounter: Payer: Self-pay | Admitting: Cardiology

## 2011-03-24 ENCOUNTER — Ambulatory Visit (INDEPENDENT_AMBULATORY_CARE_PROVIDER_SITE_OTHER): Payer: 59 | Admitting: Cardiology

## 2011-03-24 DIAGNOSIS — I498 Other specified cardiac arrhythmias: Secondary | ICD-10-CM

## 2011-03-24 MED ORDER — METOPROLOL TARTRATE 25 MG PO TABS: 25.0000 mg | ORAL_TABLET | Freq: Two times a day (BID) | ORAL | Status: DC

## 2011-03-24 NOTE — Progress Notes (Signed)
[name] Date of Birth: April 13, 1936   History of Present Illness: Cora is seen today for followup after her event monitor. She has had recurrent symptoms of palpitation not associated with near syncope. Her event monitor yesterday showed episodes of supraventricular tachycardia initiated by [facility] with heart rate of 160-170 beats per minute. She notes that eating chocolate seems to stimulate these episodes. She avoids caffeine. She does continue to smoke.  Current Outpatient Prescriptions on File Prior to Visit  Medication Sig Dispense Refill  . ARMOUR THYROID 60 MG tablet TAKE 1 TABLET BY MOUTH EVERY MORNING 30 MINUTES BEFORE BREAKFAST WITH 30MG  TABLET  90 tablet  0  . oxyCODONE (OXYCONTIN) 15 MG TB12 Take 15 mg by mouth every 12 (twelve) hours.        . pantoprazole (PROTONIX) 40 MG tablet TAKE 1 TABLET EVERY MORNING  90 tablet  1  . sertraline (ZOLOFT) 50 MG tablet Take 50 mg by mouth daily.        [name] spironolactone (ALDACTONE) 25 MG tablet Take 25 mg by mouth daily.          Allergies  Allergen Reactions  . Aspirin   . Atorvastatin     REACTION: feel weak  . Carisoprodol     REACTION: intolerant  . Codeine     REACTION: pt. reports allergy to codeine, reaction not known  . Codeine Phosphate     REACTION: UNKNOWN  . Cortisone     REACTION: pt. reports allergy, reaction not known  . Ezetimibe-Simvastatin     REACTION: feels weak  . YQM:VHQIONGEXBM+WUXLKGMWN+UUVOZDGUYQ Acid+Aspartame     REACTION: diarrhea  . Levothyroxine Sodium     REACTION: reaction not known  . Naproxen Sodium     REACTION: GI symptoms  . Omeprazole     REACTION: abd pain  . Quinapril Hcl     REACTION: reports allergy, reaction not known  . Rofecoxib     REACTION: reports allergy, reaction not known    Past Medical History  Diagnosis Date  . Hypertension   . Hyperlipidemia   . PVC's (premature ventricular contractions)   . Tobacco dependence   . IBS (irritable bowel syndrome)   .  Diverticulitis   . Palpitations   . Chronic pain     Managed by Dr. Vear Clock    Past Surgical History  Procedure Date  . Tonsillectomy   . Nasal sinus surgery   . Wrist fracture surgery   . Replacement total knee     History  Smoking status  . Current Everyday Smoker -- 0.5 packs/day  Smokeless tobacco  . Not on file    History  Alcohol Use No    Family History  Problem Relation Age of Onset  . Diabetes Mother   . Heart attack Father   . Heart disease Sister     Review of Systems: The review of systems is as above.   All other systems were reviewed and are negative.  Physical Exam: BP 128/80  Pulse 64  Ht 5\' 7"  (1.702 m)  Wt 204 lb 12.8 oz (92.897 kg)  BMI 32.08 kg/m2 Patient is in no acute distress. She looks chronically ill to me. She is obese. Skin is warm and dry. Color is normal.  HEENT is unremarkable. Normocephalic/atraumatic. PERRL. Sclera are nonicteric. Neck is supple. No masses. No JVD. Lungs are clear. Cardiac exam shows a regular rate and rhythm. She does have an occasional ectopic.  Abdomen is soft. Extremities are  without edema. Gait and ROM are intact. No gross neurologic deficits noted.  LABORATORY DATA:  EKG shows sinus with nonspecific ST and T wave changes. Event monitor is as noted with episodes of supraventricular tachycardia. Recent basic metabolic panel was normal except for a sodium level of 134. TSH and CBC were normal.  Assessment / Plan:

## 2011-03-24 NOTE — Assessment & Plan Note (Addendum)
Documented SVT by event monitor. I have recommended stopping her bystolic. We will start her on metoprolol 25 mg b.i.d. If she continues to have episodes we will increase his dose as tolerated. I've recommended that she avoid caffeine and chocolate. I have strongly recommended smoking cessation. She has been able to quit in the past cold [place]. She reports intolerance to Wellbutrin in the past. If she continues to have breakthrough despite optimal medical therapy radiofrequency ablation can be considered. Depending on her response to therapy we'll consider an echocardiogram on her next visit. I will see her back again in one month.

## 2011-03-24 NOTE — Patient Instructions (Addendum)
Stop Bystolic   Take Metoprolol 25 mg twice a day.  I will see you again in one month.  Avoid caffeine and chocholate.  Quit smoking!

## 2011-03-25 ENCOUNTER — Telehealth: Payer: Self-pay | Admitting: Cardiology

## 2011-03-25 NOTE — Telephone Encounter (Signed)
Was started on Metoprolol yest and wanted to know if could take with other meds. Advised was ok to take both aldactone and Metoprolol in am together but she will need to take Metoprolol also at HS. States she is afraid to take the med before going to bed. Advised was OK to take.

## 2011-03-25 NOTE — Telephone Encounter (Signed)
Pt is now taking 2 blood pressure meds She wants to know how far apart to take them

## 2011-03-26 ENCOUNTER — Encounter: Payer: Self-pay | Admitting: Cardiology

## 2011-03-31 ENCOUNTER — Telehealth: Payer: Self-pay | Admitting: Cardiology

## 2011-03-31 NOTE — Telephone Encounter (Signed)
Pt called saying her ankles are swelling and shes been having headache a lot. She is thinking it might be one of her meds. Please call

## 2011-03-31 NOTE — Telephone Encounter (Signed)
Will call back after talking to Dr. Swaziland

## 2011-04-01 MED ORDER — FUROSEMIDE 20 MG PO TABS: ORAL_TABLET | ORAL | Status: DC

## 2011-04-01 NOTE — Telephone Encounter (Signed)
Called stating she has had some swelling in ankles x 3-4 days. Dr. Swaziland started her on Metoprolol and wondered if that was causing. BP is 126/88. Per Dr. Swaziland does not feel like medication causing swelling. She is watching her salt intake. Per Dr. Sw[place]iland will give some Lasix 20 mg to take prn when has swelling. Advised to keep legs elevated, also c/o headaches but that maybe allergy related. Again Dr. Swaziland does not feel that is meds. Advised to continue to monitor BP and call if elevated.

## 2011-04-12 ENCOUNTER — Telehealth: Payer: Self-pay | Admitting: *Deleted

## 2011-04-12 MED ORDER — MECLIZINE HCL 25 MG PO TABS: 25.0000 mg | ORAL_TABLET | Freq: Three times a day (TID) | ORAL | Status: DC | PRN

## 2011-04-12 NOTE — Telephone Encounter (Signed)
Refill request from [facility] [address] for meclizine 25 mg's.  Last filled on 05/06/1938.  This is no longer on med list.

## 2011-04-12 NOTE — Telephone Encounter (Signed)
She has taken this in past refil is ok  I will send electronically to pharmacy

## 2011-04-14 ENCOUNTER — Encounter: Payer: Self-pay | Admitting: Cardiology

## 2011-04-19 ENCOUNTER — Ambulatory Visit (INDEPENDENT_AMBULATORY_CARE_PROVIDER_SITE_OTHER): Payer: 59 | Admitting: Family Medicine

## 2011-04-19 ENCOUNTER — Ambulatory Visit (INDEPENDENT_AMBULATORY_CARE_PROVIDER_SITE_OTHER)
Admission: RE | Admit: 2011-04-19 | Discharge: 2011-04-19 | Disposition: A | Discharge status: Home / Self Care | Payer: 59 | Source: Ambulatory Visit | Attending: Family Medicine | Admitting: Family Medicine

## 2011-04-19 ENCOUNTER — Encounter: Payer: Self-pay | Admitting: Family Medicine

## 2011-04-19 ENCOUNTER — Encounter: Payer: Self-pay | Admitting: Cardiology

## 2011-04-19 VITALS — BP 120/64 | HR 60 | Temp 98.5°F | Ht 67.0 in | Wt 207.2 lb

## 2011-04-19 DIAGNOSIS — I10 Essential (primary) hypertension: Secondary | ICD-10-CM

## 2011-04-19 DIAGNOSIS — M79672 Pain in left foot: Secondary | ICD-10-CM

## 2011-04-19 DIAGNOSIS — F329 Major depressive disorder, single episode, unspecified: Secondary | ICD-10-CM

## 2011-04-19 DIAGNOSIS — F32A Depression, unspecified: Secondary | ICD-10-CM | POA: Insufficient documentation

## 2011-04-19 DIAGNOSIS — M79609 Pain in unspecified limb: Secondary | ICD-10-CM

## 2011-04-19 MED ORDER — SERTRALINE HCL 50 MG PO TABS: 50.0000 mg | ORAL_TABLET | Freq: Every day | ORAL | Status: DC

## 2011-04-19 NOTE — Assessment & Plan Note (Signed)
New L foot pain with lateral swelling under malleolus  Is tender, but with nl rom and gait / no redness or heat  No hx of trauma- but looks sprained X ray today Pt also sees Dr Vear Clock today about L leg pain - chronic

## 2011-04-19 NOTE — Progress Notes (Signed)
Subjective:    Patient ID: [name], female    DOB: Oct 23, 1935, 75 y.o.   MRN: [id number]  HPI Here for pain and swelling in foot L  Does not think she injured it  Started hurting about a week ago  Swelling then too  Redness - at lateral malleolus and down into foot - and achilles tendon  The swelling is improved and pain improved also at this time  Took her pain pills that she usually takes for her leg  Hurts whether she walks on it or not  Not severe pain   Otherwise doing about the same  Is taking shots in her leg in her calf muscule -- thinks it is a damaged muscle  Last shot was 3-4 weeks ago -- goes back today and sees Dr Vear Clock  Did not try ice or heat   Labs in July were ok  bp good at 120/64 No cp or palpitations   Is up 3 lb  Needs refil of her zoloft - it works well for depression and anx  Has been on it for a while  No side effects or problems   Patient Active Problem List  Diagnoses  . HYPOTHYROIDISM  . HYPERCHOLESTEROLEMIA  . OBESITY  . TOBACCO ABUSE  . HYPERTENSION  . SUPRAVENTRICULAR TACHYCARDIA  . ALLERGIC RHINITIS  . COPD  . OSTEOARTHRITIS  . BACK PAIN, CHRONIC  . VERTIGO  . LEG PAIN, LEFT  . Pre-syncope  . Left foot pain  . Depression   Past Medical History  Diagnosis Date  . Hypertension   . Hyperlipidemia   . PVC's (premature ventricular contractions)   . Tobacco dependence   . IBS (irritable bowel syndrome)   . Diverticulitis   . Palpitations   . Chronic pain     Managed by Dr. Vear Clock   Past Surgical History  Procedure Date  . Tonsillectomy   . Nasal sinus surgery   . Wrist fracture surgery   . Replacement total knee    History  Substance Use Topics  . Smoking status: Current Everyday Smoker -- 0.5 packs/day  . Smokeless tobacco: Not on file  . Alcohol Use: No   Family History  Problem Relation Age of Onset  . Diabetes Mother   . Heart attack Father   . Heart disease Sister    Allergies  Allergen  Reactions  . Aspirin   . Atorvastatin     REACTION: feel weak  . Carisoprodol     REACTION: intolerant  . Codeine     REACTION: pt. reports allergy to codeine, reaction not known  . Codeine Phosphate     REACTION: UNKNOWN  . Cortisone     REACTION: pt. reports allergy, reaction not known  . Ezetimibe-Simvastatin     REACTION: feels weak  . JYN:WGNFAOZHYQM+VHQIONGEX+BMWUXLKGMW Acid+Aspartame     REACTION: diarrhea  . Levothyroxine Sodium     REACTION: reaction not known  . Naproxen Sodium     REACTION: GI symptoms  . Omeprazole     REACTION: abd pain  . Quinapril Hcl     REACTION: reports allergy, reaction not known  . Rofecoxib     REACTION: reports allergy, reaction not known   Current Outpatient Prescriptions on File Prior to Visit  Medication Sig Dispense Refill  . furosemide (LASIX) 20 MG tablet Take one tablet daily as needed for swelling in ankles  30 tablet  3  . meclizine (ANTIVERT) 25 MG tablet Take 1 tablet (25 mg total)  by mouth 3 (three) times daily as needed for dizziness or nausea (warning- can sedate).  30 tablet  0  . metoprolol tartrate (LOPRESSOR) 25 MG tablet Take 1 tablet (25 mg total) by mouth 2 (two) times daily.  60 tablet  11  . oxyCODONE (OXYCONTIN) 15 MG TB12 Take 15 mg by mouth every 12 (twelve) hours.        . pantoprazole (PROTONIX) 40 MG tablet TAKE 1 TABLET EVERY MORNING  90 tablet  1  . spironolactone (ALDACTONE) 25 MG tablet Take 25 mg by mouth daily.        [name] DISCONTD: ARMOUR THYROID 60 MG tablet TAKE 1 TABLET BY MOUTH EVERY MORNING 30 MINUTES BEFORE BREAKFAST WITH 30MG  TABLET  90 tablet  0  . DISCONTD: sertraline (ZOLOFT) 50 MG tablet Take 50 mg by mouth daily.           Review of Systems Review of Systems  Constitutional: Negative for fever, appetite change, fatigue and unexpected weight change.  Eyes: Negative for pain and visual disturbance.  Respiratory: Negative for cough and shortness of breath.   Cardiovascular: Negative.  for cp  or palpitations  Gastrointestinal: Negative for nausea, diarrhea and constipation.  Genitourinary: Negative for urgency and frequency.  Skin: Negative for pallor.or rash  MSK pos for chronic L leg pain , pos for edema   Neurological: Negative for weakness, light-headedness, numbness and headaches.  Hematological: Negative for adenopathy. Does not bruise/bleed easily.  Psychiatric/Behavioral: Negative for dysphoric mood. The patient is not nervous/anxious.          Objective:   Physical Exam  Constitutional: She appears well-developed and well-nourished. No distress.       overwt and well appearing   HENT:  Head: Normocephalic and atraumatic.  Mouth/Throat: Oropharynx is clear and moist.  Eyes: Conjunctivae and EOM are normal. Pupils are equal, round, and reactive to light.  Neck: Normal range of motion. Neck supple. No JVD present. Carotid bruit is not present. No thyromegaly present.  Cardiovascular: Normal rate, regular rhythm and normal heart sounds.   Pulmonary/Chest: Effort normal and breath sounds normal. No respiratory distress. She has no wheezes.  Abdominal: Soft. Bowel sounds are normal. She exhibits no distension, no abdominal bruit and no mass. There is no tenderness.  Musculoskeletal: Normal range of motion. She exhibits edema and tenderness.       Focal edema around L malleolus and under it with some tenderness  No bruising  Some pain on plantar flexion of foot and also inversion  Gait is unaffected Calf is not swollen , no palpable cords  Nl perfusion    Lymphadenopathy:    She has no cervical adenopathy.  Neurological: She is alert. She has normal reflexes. No cranial nerve deficit. Coordination normal.  Skin: Skin is warm and dry. No rash noted. No erythema. No pallor.  Psychiatric: She has a normal mood and affect.          Assessment & Plan:

## 2011-04-19 NOTE — Assessment & Plan Note (Signed)
Is newly on aldactone from Dr Swaziland Doing well/ no problems Takes lasix very infrequently

## 2011-04-19 NOTE — Patient Instructions (Signed)
X rays today We will update you tomorrow with results Elevate foot and use warm compress if it helps Will make plan after we gets results

## 2011-04-30 ENCOUNTER — Ambulatory Visit (INDEPENDENT_AMBULATORY_CARE_PROVIDER_SITE_OTHER): Payer: 59 | Admitting: Cardiology

## 2011-04-30 ENCOUNTER — Encounter: Payer: Self-pay | Admitting: Cardiology

## 2011-04-30 DIAGNOSIS — I498 Other specified cardiac arrhythmias: Secondary | ICD-10-CM

## 2011-04-30 NOTE — Assessment & Plan Note (Signed)
Her SVT is very well controlled on a low dose of metoprolol. I recommended continued avoidance of stimulants particularly caffeine. I've recommended that she stop smoking. She may take an extra metoprolol at needed for breakthrough symptoms. Otherwise I will followup again in 6 months.

## 2011-04-30 NOTE — Progress Notes (Signed)
[name] Date of Birth: March 08, 1936   History of Present Illness: [name] is seen today for followup today. She has had only one episode of tachycardia since August 1. This lasts only 5 seconds. She is tolerating her metoprolol well. She denies any fatigue or dizziness. She avoids caffeine. She does continue to smoke.  Current Outpatient Prescriptions on File Prior to Visit  Medication Sig Dispense Refill  . meclizine (ANTIVERT) 25 MG tablet Take 1 tablet (25 mg total) by mouth 3 (three) times daily as needed for dizziness or nausea (warning- can sedate).  30 tablet  0  . metoprolol tartrate (LOPRESSOR) 25 MG tablet Take 1 tablet (25 mg total) by mouth 2 (two) times daily.  60 tablet  11  . oxyCODONE (OXYCONTIN) 15 MG TB12 Take 15 mg by mouth every 12 (twelve) hours.        . pantoprazole (PROTONIX) 40 MG tablet TAKE 1 TABLET EVERY MORNING  90 tablet  1  . sertraline (ZOLOFT) 50 MG tablet Take 1 tablet (50 mg total) by mouth daily.  90 tablet  3  . spironolactone (ALDACTONE) 25 MG tablet Take 25 mg by mouth daily.        [name] thyroid (ARMOUR THYROID) 60 MG tablet          Allergies  Allergen Reactions  . Aspirin   . Atorvastatin     REACTION: feel weak  . Carisoprodol     REACTION: intolerant  . Codeine     REACTION: pt. reports allergy to codeine, reaction not known  . Codeine Phosphate     REACTION: UNKNOWN  . Cortisone     REACTION: pt. reports allergy, reaction not known  . Ezetimibe-Simvastatin     REACTION: feels weak  . WUJ:WJXBJYNWGNF+AOZHYQMVH+QIONGEXBMW Acid+Aspartame     REACTION: diarrhea  . Levothyroxine Sodium     REACTION: reaction not known  . Naproxen Sodium     REACTION: GI symptoms  . Omeprazole     REACTION: abd pain  . Quinapril Hcl     REACTION: reports allergy, reaction not known  . Rofecoxib     REACTION: reports allergy, reaction not known    Past Medical History  Diagnosis Date  . Hypertension   . Hyperlipidemia   . PVC's (premature  ventricular contractions)   . Tobacco dependence   . IBS (irritable bowel syndrome)   . Diverticulitis   . Palpitations   . Chronic pain     Managed by Dr. Vear Clock  . SVT (supraventricular tachycardia)     Past Surgical History  Procedure Date  . Tonsillectomy   . Nasal sinus surgery   . Wrist fracture surgery   . Replacement total knee     History  Smoking status  . Current Everyday Smoker -- 0.5 packs/day  Smokeless tobacco  . Not on file    History  Alcohol Use No    Family History  Problem Relation Age of Onset  . Diabetes Mother   . Heart attack Father   . Heart disease Sister     Review of Systems: The review of systems is as above.   All other systems were reviewed and are negative.  Physical Exam: BP 120/80  Pulse 62  Ht 5\' 7"  (1.702 m)  Wt 208 lb 6.4 oz (94.53 kg)  BMI 32.64 kg/m2 Patient is in no acute distress.  She is obese. Skin is warm and dry. Color is normal.  HEENT is unremarkable. Normocephalic/atraumatic. PERRL. Sclera  are nonicteric. Neck is supple. No masses. No JVD. Lungs are clear. Cardiac exam shows a regular rate and rhythm. She does have an occasional ectopic.  Abdomen is soft. Extremities are without edema. Gait and ROM are intact. No gross neurologic deficits noted.  LABORATORY DATA:  EKG shows sinus with nonspecific ST and T wave changes.  Assessment / Plan:

## 2011-04-30 NOTE — Patient Instructions (Signed)
Continue your current medication.  Stop smoking.  I will see you again in 6 months.

## 2011-05-11 ENCOUNTER — Other Ambulatory Visit: Payer: Self-pay | Admitting: *Deleted

## 2011-05-11 MED ORDER — SPIRONOLACTONE 25 MG PO TABS: 25.0000 mg | ORAL_TABLET | Freq: Every day | ORAL | Status: DC

## 2011-05-20 LAB — DIFFERENTIAL
Basophils Absolute: 0
Basophils Relative: 1
Eosinophils Absolute: 0
Monocytes Absolute: 0.6
Monocytes Relative: 8
Neutro Abs: 3.9
Neutrophils Relative %: 47

## 2011-05-20 LAB — CBC
Hemoglobin: 14.6
MCHC: 34
MCV: 94
RBC: 4.57
RDW: 14.8

## 2011-05-20 LAB — POCT I-STAT, CHEM 8
Calcium, Ion: 1.11 — ABNORMAL LOW
Glucose, Bld: 108 — ABNORMAL HIGH
HCT: 45
Hemoglobin: 15.3 — ABNORMAL HIGH

## 2011-05-20 LAB — POCT CARDIAC MARKERS: Operator id: 264031

## 2011-05-24 ENCOUNTER — Telehealth: Payer: Self-pay | Admitting: Cardiology

## 2011-05-24 NOTE — Telephone Encounter (Signed)
Called wanting to know if increase dose of pain medication could cause heart to flutter. Per Dr. [place] should not cause increase of fluttering. States she did have some caffeine yest. Advised that probably triggered the episodes. Will try to avoid all caffeine. States it just comes and goes. Will call if continues to bother her.

## 2011-05-24 NOTE — Telephone Encounter (Signed)
Pt calling needing to speak w/ Synetta Fail. Dr. Vear Clock pt pain management MD went up on pt pain medication, oxycontin from 15mg  up to 20 mg;   and pt wants to know if it could cause pt to have heart flutters. Please return pt call to discuss further.

## 2011-05-25 ENCOUNTER — Other Ambulatory Visit: Payer: Self-pay | Admitting: Family Medicine

## 2011-05-27 NOTE — Telephone Encounter (Signed)
Will refill electronically  

## 2011-06-11 ENCOUNTER — Other Ambulatory Visit: Payer: Self-pay | Admitting: *Deleted

## 2011-06-11 MED ORDER — SERTRALINE HCL 50 MG PO TABS: 50.0000 mg | ORAL_TABLET | Freq: Every day | ORAL | Status: DC

## 2011-06-11 NOTE — Telephone Encounter (Signed)
Will refill electronically  

## 2011-09-07 ENCOUNTER — Other Ambulatory Visit: Payer: Self-pay | Admitting: Family Medicine

## 2011-11-09 ENCOUNTER — Ambulatory Visit (INDEPENDENT_AMBULATORY_CARE_PROVIDER_SITE_OTHER): Payer: 59 | Admitting: Cardiology

## 2011-11-09 ENCOUNTER — Encounter: Payer: Self-pay | Admitting: Cardiology

## 2011-11-09 VITALS — BP 132/88 | HR 64 | Ht 67.0 in | Wt 216.0 lb

## 2011-11-09 DIAGNOSIS — E785 Hyperlipidemia, unspecified: Secondary | ICD-10-CM | POA: Diagnosis not present

## 2011-11-09 DIAGNOSIS — I4949 Other premature depolarization: Secondary | ICD-10-CM

## 2011-11-09 DIAGNOSIS — I10 Essential (primary) hypertension: Secondary | ICD-10-CM | POA: Diagnosis not present

## 2011-11-09 DIAGNOSIS — E7800 Pure hypercholesterolemia, unspecified: Secondary | ICD-10-CM

## 2011-11-09 DIAGNOSIS — I493 Ventricular premature depolarization: Secondary | ICD-10-CM

## 2011-11-09 DIAGNOSIS — I498 Other specified cardiac arrhythmias: Secondary | ICD-10-CM

## 2011-11-09 DIAGNOSIS — M1990 Unspecified osteoarthritis, unspecified site: Secondary | ICD-10-CM

## 2011-11-09 DIAGNOSIS — F17200 Nicotine dependence, unspecified, uncomplicated: Secondary | ICD-10-CM

## 2011-11-09 DIAGNOSIS — I471 Supraventricular tachycardia: Secondary | ICD-10-CM

## 2011-11-09 MED ORDER — METOPROLOL TARTRATE 25 MG PO TABS: 25.0000 mg | ORAL_TABLET | Freq: Two times a day (BID) | ORAL | Status: DC

## 2011-11-09 NOTE — Progress Notes (Signed)
[name] Date of Birth: 08/28/1935   History of Present Illness: [name] is seen today for followup today. She denies any recurrent symptoms of palpitations. She continues to smoke. She denies any shortness of breath or chest pain. She did suffer a fall after tripping but suffered no fractures. She complains of left shoulder pain and immobility.  Current Outpatient Prescriptions on File Prior to Visit  Medication Sig Dispense Refill  . ARMOUR THYROID 60 MG tablet TAKE 1 TABLET BY MOUTH EVERY MORNING 30 MINUTES BEFORE BREAKFAST WITH 30MG  TABLET  90 tablet  2  . lidocaine (LIDODERM) 5 % Place 1 patch onto the skin daily. Remove & Discard patch within 12 hours or as directed by MD       . meclizine (ANTIVERT) 25 MG tablet TAKE 1 TABLET 3 TIMES A DAY AS NEEDED FOR DIZZINESS OR NAUSEA  30 tablet  3  . oxyCODONE (OXYCONTIN) 15 MG TB12 Take 20 mg by mouth every 12 (twelve) hours.       . pantoprazole (PROTONIX) 40 MG tablet TAKE 1 TABLET EVERY MORNING  90 tablet  1  . sertraline (ZOLOFT) 50 MG tablet Take 1 tablet (50 mg total) by mouth daily.  90 tablet  3  . spironolactone (ALDACTONE) 25 MG tablet Take 1 tablet (25 mg total) by mouth daily.  90 tablet  4  . DISCONTD: metoprolol tartrate (LOPRESSOR) 25 MG tablet Take 1 tablet (25 mg total) by mouth 2 (two) times daily.  60 tablet  11    Allergies  Allergen Reactions  . Aspirin   . Atorvastatin     REACTION: feel weak  . Carisoprodol     REACTION: intolerant  . Codeine     REACTION: pt. reports allergy to codeine, reaction not known  . Codeine Phosphate     REACTION: UNKNOWN  . Cortisone     REACTION: pt. reports allergy, reaction not known  . Ezetimibe-Simvastatin     REACTION: feels weak  . WUJ:[id number]+AOZHYQMVH+QIONGEXBMW Acid+Aspartame     REACTION: diarrhea  . Levothyroxine Sodium     REACTION: reaction not known  . Naproxen Sodium     REACTION: GI symptoms  . Omeprazole     REACTION: abd pain  . Quinapril Hcl      REACTION: reports allergy, reaction not known  . Rofecoxib     REACTION: reports allergy, reaction not known    Past Medical History  Diagnosis Date  . Hypertension   . Hyperlipidemia   . PVC's (premature ventricular contractions)   . Tobacco dependence   . IBS (irritable bowel syndrome)   . Diverticulitis   . Palpitations   . Chronic pain     Managed by Dr. Vear Clock  . SVT (supraventricular tachycardia)     Past Surgical History  Procedure Date  . Tonsillectomy   . Nasal sinus surgery   . Wrist fracture surgery   . Replacement total knee     History  Smoking status  . Current Everyday Smoker -- 0.5 packs/day  Smokeless tobacco  . Not on file    History  Alcohol Use No    Family History  Problem Relation Age of Onset  . Diabetes Mother   . Heart attack Father   . Heart disease Sister     Review of Systems: The review of systems is as above.   All other systems were reviewed and are negative.  Physical Exam: BP 132/88  Pulse 64  Ht 5\' 7"  (  1.702 m)  Wt 97.977 kg (216 lb)  BMI 33.83 kg/m2 Patient is in no acute distress.  She is obese. Skin is warm and dry. Color is normal.  HEENT is unremarkable. Normocephalic/atraumatic. PERRL. Sclera are nonicteric. Neck is supple. No masses. No JVD. Lungs are clear. Cardiac exam shows a regular rate and rhythm.  Abdomen is soft. Extremities are without edema. She walks with a cane. Her left shoulder demonstrates decreased range of motion in rotation and abduction. No gross neurologic deficits noted.  LABORATORY DATA:    Assessment / Plan:

## 2011-11-09 NOTE — Patient Instructions (Signed)
Continue your current medications  I will see you again in 6 month with fasting lab

## 2011-11-09 NOTE — Assessment & Plan Note (Signed)
She has had no recurrent tachycardia with adjustment in her beta blocker dose. We will continue on her current dose of metoprolol.

## 2011-11-09 NOTE — Assessment & Plan Note (Signed)
She has significant pain and limitation of motion in her left shoulder. She has been offered a steroid injection at the pain clinic. I recommended she have it evaluated with her orthopedic surgeon first. She sees Dr. Charlann Boxer regularly.

## 2011-11-09 NOTE — Assessment & Plan Note (Signed)
I recommended smoking cessation. Patient states she is trying to quit but doesn't really have a plan. We discussed what was available for help with smoking cessation.

## 2011-11-09 NOTE — Assessment & Plan Note (Signed)
Her blood pressure is acceptable today

## 2011-11-09 NOTE — Assessment & Plan Note (Signed)
She has multiple drug intolerances. We will plan on checking fasting lab work on her followup visit in 6 months.

## 2011-11-12 ENCOUNTER — Other Ambulatory Visit: Payer: Self-pay | Admitting: Dermatology

## 2011-11-12 DIAGNOSIS — M1710 Unilateral primary osteoarthritis, unspecified knee: Secondary | ICD-10-CM | POA: Diagnosis not present

## 2011-11-12 DIAGNOSIS — D485 Neoplasm of uncertain behavior of skin: Secondary | ICD-10-CM | POA: Diagnosis not present

## 2011-11-26 DIAGNOSIS — M1710 Unilateral primary osteoarthritis, unspecified knee: Secondary | ICD-10-CM | POA: Diagnosis not present

## 2011-12-07 ENCOUNTER — Ambulatory Visit (INDEPENDENT_AMBULATORY_CARE_PROVIDER_SITE_OTHER): Payer: 59 | Admitting: Family Medicine

## 2011-12-07 ENCOUNTER — Encounter: Payer: Self-pay | Admitting: Family Medicine

## 2011-12-07 VITALS — BP 120/60 | HR 88 | Temp 98.0°F | Wt 212.0 lb

## 2011-12-07 DIAGNOSIS — J449 Chronic obstructive pulmonary disease, unspecified: Secondary | ICD-10-CM

## 2011-12-07 DIAGNOSIS — J069 Acute upper respiratory infection, unspecified: Secondary | ICD-10-CM

## 2011-12-07 MED ORDER — ALBUTEROL SULFATE HFA 108 (90 BASE) MCG/ACT IN AERS: 2.0000 | INHALATION_SPRAY | Freq: Four times a day (QID) | RESPIRATORY_TRACT | Status: DC | PRN

## 2011-12-07 MED ORDER — DOXYCYCLINE HYCLATE 100 MG PO TABS: 100.0000 mg | ORAL_TABLET | Freq: Two times a day (BID) | ORAL | Status: DC

## 2011-12-07 NOTE — Patient Instructions (Addendum)
It was good to see you. Please take antibiotic (doxycycline) if your symptoms do not improve over next few days. Ok to take over the counter Mucinex, claritin or Careers adviser. Use proair inhaler as needed for shortness of breath. Please call us if your symptoms worsen or you develop a fever.

## 2011-12-07 NOTE — Progress Notes (Signed)
SUBJECTIVE:  [name] is a 76 y.o. female who complains of coryza, congestion and myalgias for 3 days. She denies a history of anorexia, chest pain, vomiting, weakness and weight loss and admits to a history of asthma. Patient denies smoke cigarettes.  Recently quit smoking (11/22/2011).   Patient Active Problem List  Diagnoses  . HYPOTHYROIDISM  . HYPERCHOLESTEROLEMIA  . OBESITY  . TOBACCO ABUSE  . HYPERTENSION  . SUPRAVENTRICULAR TACHYCARDIA  . ALLERGIC RHINITIS  . COPD  . OSTEOARTHRITIS  . BACK PAIN, CHRONIC  . VERTIGO  . LEG PAIN, LEFT  . Pre-syncope  . Left foot pain  . Depression   Past Medical History  Diagnosis Date  . Hypertension   . Hyperlipidemia   . PVC's (premature ventricular contractions)   . Tobacco dependence   . IBS (irritable bowel syndrome)   . Diverticulitis   . Palpitations   . Chronic pain     Managed by Dr. Vear Clock  . SVT (supraventricular tachycardia)    Past Surgical History  Procedure Date  . Tonsillectomy   . Nasal sinus surgery   . Wrist fracture surgery   . Replacement total knee    History  Substance Use Topics  . Smoking status: Former Smoker -- 0.5 packs/day  . Smokeless tobacco: Not on file   Comment: Quit 11/22/11.  [name] Alcohol Use: No   Family History  Problem Relation Age of Onset  . Diabetes Mother   . Heart attack Father   . Heart disease Sister    Allergies  Allergen Reactions  . Aspirin   . Atorvastatin     REACTION: feel weak  . Carisoprodol     REACTION: intolerant  . Codeine     REACTION: pt. reports allergy to codeine, reaction not known  . Codeine Phosphate     REACTION: UNKNOWN  . Cortisone     REACTION: pt. reports allergy, reaction not known  . Ezetimibe-Simvastatin     REACTION: feels weak  . ZOX:WRUEAVWUJWJ+XBJYNWGNF+AOZHYQMVHQ Acid+Aspartame     REACTION: diarrhea  . Levothyroxine Sodium     REACTION: reaction not known  . Naproxen Sodium     REACTION: GI symptoms  . Omeprazole    REACTION: abd pain  . Quinapril Hcl     REACTION: reports allergy, reaction not known  . Rofecoxib     REACTION: reports allergy, reaction not known   Current Outpatient Prescriptions on File Prior to Visit  Medication Sig Dispense Refill  . ARMOUR THYROID 60 MG tablet TAKE 1 TABLET BY MOUTH EVERY MORNING 30 MINUTES BEFORE BREAKFAST WITH 30MG  TABLET  90 tablet  2  . lidocaine (LIDODERM) 5 % Place 1 patch onto the skin daily. Remove & Discard patch within 12 hours or as directed by MD       . meclizine (ANTIVERT) 25 MG tablet TAKE 1 TABLET 3 TIMES A DAY AS NEEDED FOR DIZZINESS OR NAUSEA  30 tablet  3  . metoprolol tartrate (LOPRESSOR) 25 MG tablet Take 1 tablet (25 mg total) by mouth 2 (two) times daily.  180 tablet  3  . pantoprazole (PROTONIX) 40 MG tablet TAKE 1 TABLET EVERY MORNING  90 tablet  1  . sertraline (ZOLOFT) 50 MG tablet Take 1 tablet (50 mg total) by mouth daily.  90 tablet  3  . spironolactone (ALDACTONE) 25 MG tablet Take 1 tablet (25 mg total) by mouth daily.  90 tablet  4   The PMH, PSH, Social History, Family History, Medications,  and allergies have been reviewed in [facility], and have been updated if relevant.  OBJECTIVE: BP 120/60  Pulse 88  Temp(Src) 98 F (36.7 C) (Oral)  Wt 212 lb (96.163 kg)  SpO2 96%  She appears well, vital signs are as noted. Ears normal.  Throat and pharynx normal.  Neck supple. No adenopathy in the neck. Nose is congested. Sinuses non tender. No rales, faint, scattered exp wheeze with prolonged exp phase, right >left.  ASSESSMENT:  viral upper respiratory illness  PLAN: Symptomatic therapy suggested: push fluids, rest and return office visit prn if symptoms persist or worsen. Lack of antibiotic effectiveness discussed with her but rx given for doxycycline to fill if symptoms progress.  Sample of proair given. Call or return to clinic prn if these symptoms worsen or fail to improve as anticipated.

## 2011-12-09 ENCOUNTER — Telehealth: Payer: Self-pay

## 2011-12-09 NOTE — Telephone Encounter (Signed)
Like MSK strain from cough- she is on Doxy and proair ( I saw her two days ago).  Needs to follow up with Dr. Milinda Antis if symptoms do not improve but it takes time.

## 2011-12-09 NOTE — Telephone Encounter (Signed)
Patient notified as instructed by telephone. Pt scheduled appt with Dr Milinda Antis 12/10/11 at 2:15pm. Pt instructed if condition changes or worsens pt will call back or if at night go to [facility] or ER.

## 2011-12-09 NOTE — Telephone Encounter (Signed)
Pt seen 12/07/11 condition same as when seen; productive cough with clear phlegm, wheezing, SOB.; except pt developed pain right side under breast when coughs(pt not sure when started). No fever. Pt still taking Doxycycline and Proair. Pt does not feel like coming to office wants med to help with wheezing. Pt uses [facility] [address] and can be reached at [phone].

## 2011-12-10 ENCOUNTER — Encounter: Payer: Self-pay | Admitting: Family Medicine

## 2011-12-10 ENCOUNTER — Ambulatory Visit (INDEPENDENT_AMBULATORY_CARE_PROVIDER_SITE_OTHER): Payer: 59 | Admitting: Family Medicine

## 2011-12-10 VITALS — BP 110/70 | HR 60 | Temp 98.0°F | Wt 213.0 lb

## 2011-12-10 DIAGNOSIS — J209 Acute bronchitis, unspecified: Secondary | ICD-10-CM

## 2011-12-10 DIAGNOSIS — J449 Chronic obstructive pulmonary disease, unspecified: Secondary | ICD-10-CM | POA: Insufficient documentation

## 2011-12-10 MED ORDER — BENZONATATE 200 MG PO CAPS: 200.0000 mg | ORAL_CAPSULE | Freq: Three times a day (TID) | ORAL | Status: AC | PRN

## 2011-12-10 MED ORDER — PREDNISONE 10 MG PO TABS: ORAL_TABLET | ORAL | Status: DC

## 2011-12-10 MED ORDER — ALBUTEROL SULFATE HFA 108 (90 BASE) MCG/ACT IN AERS: 2.0000 | INHALATION_SPRAY | RESPIRATORY_TRACT | Status: DC | PRN

## 2011-12-10 NOTE — Progress Notes (Signed)
Subjective:    Patient ID: [name], female    DOB: Feb 17, 1936, 76 y.o.   MRN: [id number]  HPI Is coughing a lot  Hurts under her breast when she coughs  Does not hurt in between  No rash   Getting over bronchitis- still finishing doxycycline   All clear mucous so far  Wheezing on and off   Quit smoking on the first of the month  Not craving really bad  Has a proventil inhaler --needs a refil  Does help   No fever  No ear pain or throat pain   Runny nose too - is allergy season   Patient Active Problem List  Diagnoses  . HYPOTHYROIDISM  . HYPERCHOLESTEROLEMIA  . OBESITY  . HYPERTENSION  . SUPRAVENTRICULAR TACHYCARDIA  . ALLERGIC RHINITIS  . COPD  . OSTEOARTHRITIS  . BACK PAIN, CHRONIC  . VERTIGO  . LEG PAIN, LEFT  . Pre-syncope  . Left foot pain  . Depression  . Bronchitis with obstruction   Past Medical History  Diagnosis Date  . Hypertension   . Hyperlipidemia   . PVC's (premature ventricular contractions)   . Tobacco dependence   . IBS (irritable bowel syndrome)   . Diverticulitis   . Palpitations   . Chronic pain     Managed by Dr. Vear Clock  . SVT (supraventricular tachycardia)    Past Surgical History  Procedure Date  . Tonsillectomy   . Nasal sinus surgery   . Wrist fracture surgery   . Replacement total knee    History  Substance Use Topics  . Smoking status: Former Smoker -- 0.5 packs/day  . Smokeless tobacco: Never Used   Comment: Quit 11/22/11.  [name] Alcohol Use: No   Family History  Problem Relation Age of Onset  . Diabetes Mother   . Heart attack Father   . Heart disease Sister    Allergies  Allergen Reactions  . Aspirin   . Atorvastatin     REACTION: feel weak  . Carisoprodol     REACTION: intolerant  . Codeine     REACTION: pt. reports allergy to codeine, reaction not known  . Codeine Phosphate     REACTION: UNKNOWN  . Cortisone     REACTION: pt. reports allergy, reaction not known  . Ezetimibe-Simvastatin    REACTION: feels weak  . QMV:HQIONGEXBMW+UXLKGMWNU+UVOZDGUYQI Acid+Aspartame     REACTION: diarrhea  . Levothyroxine Sodium     REACTION: reaction not known  . Naproxen Sodium     REACTION: GI symptoms  . Omeprazole     REACTION: abd pain  . Quinapril Hcl     REACTION: reports allergy, reaction not known  . Rofecoxib     REACTION: reports allergy, reaction not known   Current Outpatient Prescriptions on File Prior to Visit  Medication Sig Dispense Refill  . albuterol (PROVENTIL HFA;VENTOLIN HFA) 108 (90 BASE) MCG/ACT inhaler Inhale 2 puffs into the lungs every 4 (four) hours as needed for wheezing.  1 Inhaler  1  . ARMOUR THYROID 60 MG tablet TAKE 1 TABLET BY MOUTH EVERY MORNING 30 MINUTES BEFORE BREAKFAST WITH 30MG  TABLET  90 tablet  2  . doxycycline (VIBRA-TABS) 100 MG tablet Take 1 tablet (100 mg total) by mouth 2 (two) times daily.  20 tablet  0  . lidocaine (LIDODERM) 5 % Place 1 patch onto the skin daily. Remove & Discard patch within 12 hours or as directed by MD       . meclizine (ANTIVERT)  25 MG tablet TAKE 1 TABLET 3 TIMES A DAY AS NEEDED FOR DIZZINESS OR NAUSEA  30 tablet  3  . metoprolol tartrate (LOPRESSOR) 25 MG tablet Take 1 tablet (25 mg total) by mouth 2 (two) times daily.  180 tablet  3  . Oxycodone HCl 20 MG TABS Take by mouth 2 (two) times daily.      . pantoprazole (PROTONIX) 40 MG tablet TAKE 1 TABLET EVERY MORNING  90 tablet  1  . sertraline (ZOLOFT) 50 MG tablet Take 1 tablet (50 mg total) by mouth daily.  90 tablet  3  . spironolactone (ALDACTONE) 25 MG tablet Take 1 tablet (25 mg total) by mouth daily.  90 tablet  4         Review of Systems Review of Systems  Constitutional: Negative for fever, appetite change, and unexpected weight change.  Eyes: Negative for pain and visual disturbance.  ENT pos for runny and stuffy nose , neg for ST Respiratory: pos for cough and wheeze, neg for purulent sputum  Cardiovascular: Negative for cp or palpitations      Gastrointestinal: Negative for nausea, diarrhea and constipation.  Genitourinary: Negative for urgency and frequency.  Skin: Negative for pallor or rash   Neurological: Negative for weakness, light-headedness, numbness and headaches.  Hematological: Negative for adenopathy. Does not bruise/bleed easily.  Psychiatric/Behavioral: Negative for dysphoric mood. The patient is not nervous/anxious.         Objective:   Physical Exam  Constitutional: She appears well-developed and well-nourished. No distress.       overwt and nl appearing   HENT:  Head: Normocephalic and atraumatic.  Right Ear: External ear normal.  Left Ear: External ear normal.  Mouth/Throat: Oropharynx is clear and moist. No oropharyngeal exudate.       Nares are injected and congested  No sinus tenderness  Eyes: Conjunctivae and EOM are normal. Pupils are equal, round, and reactive to light. Right eye exhibits no discharge. Left eye exhibits no discharge.  Neck: Normal range of motion. Neck supple. No thyromegaly present.  Cardiovascular: Normal rate, regular rhythm and normal heart sounds.  Exam reveals no gallop.   Pulmonary/Chest: Effort normal. No respiratory distress. She has wheezes. She has no rales. She exhibits tenderness.       Mid/ L / lateral chest tenderness without crepitus or skin change Diffuse exp wheeze Fair air exch- slt prolonged exp phase Overall distant bs No rales or rhonchi  Lymphadenopathy:    She has no cervical adenopathy.  Neurological: She is alert.  Skin: Skin is warm and dry. No rash noted. No erythema. No pallor.  Psychiatric: She has a normal mood and affect.          Assessment & Plan:

## 2011-12-10 NOTE — Patient Instructions (Signed)
Take the tessalon as directed for cough (swallow whole)  Take the prednisone as directed Finish your antibiotic  Get lots of fluids and rest Update if not starting to improve in a week or if worsening   If worse , especially if short of breath- call or seek care immediately

## 2011-12-10 NOTE — Assessment & Plan Note (Signed)
In former smoker with copd   (clear mucous and no fever at this time )  Also chest wall pain from coughing  Will tx with prednisone for bronchoconstriction  Finish doxycycline  Albuterol prn  Disc symptomatic care - see instructions on AVS  Update if not starting to improve in a week or if worsening

## 2012-01-27 DIAGNOSIS — G5730 Lesion of lateral popliteal nerve, unspecified lower limb: Secondary | ICD-10-CM | POA: Diagnosis not present

## 2012-01-27 DIAGNOSIS — M25519 Pain in unspecified shoulder: Secondary | ICD-10-CM | POA: Diagnosis not present

## 2012-01-27 DIAGNOSIS — M47817 Spondylosis without myelopathy or radiculopathy, lumbosacral region: Secondary | ICD-10-CM | POA: Diagnosis not present

## 2012-02-21 ENCOUNTER — Other Ambulatory Visit: Payer: Self-pay | Admitting: Family Medicine

## 2012-02-21 NOTE — Telephone Encounter (Signed)
OK to refill? No recent labs. 

## 2012-02-21 NOTE — Telephone Encounter (Signed)
Is due for visit -please schedule  Will refill electronically

## 2012-02-22 NOTE — Telephone Encounter (Signed)
Called in Rx as directed.  

## 2012-02-28 DIAGNOSIS — M47817 Spondylosis without myelopathy or radiculopathy, lumbosacral region: Secondary | ICD-10-CM | POA: Diagnosis not present

## 2012-02-28 DIAGNOSIS — M1990 Unspecified osteoarthritis, unspecified site: Secondary | ICD-10-CM | POA: Diagnosis not present

## 2012-02-28 DIAGNOSIS — G5730 Lesion of lateral popliteal nerve, unspecified lower limb: Secondary | ICD-10-CM | POA: Diagnosis not present

## 2012-02-28 DIAGNOSIS — M25519 Pain in unspecified shoulder: Secondary | ICD-10-CM | POA: Diagnosis not present

## 2012-03-14 ENCOUNTER — Other Ambulatory Visit: Payer: Self-pay | Admitting: Family Medicine

## 2012-03-14 NOTE — Telephone Encounter (Signed)
Will refill electronically  

## 2012-03-14 NOTE — Telephone Encounter (Signed)
Received refill request electronically from pharmacy. See allergy/contraindication. Is it okay to refill medication? 

## 2012-03-27 DIAGNOSIS — M47817 Spondylosis without myelopathy or radiculopathy, lumbosacral region: Secondary | ICD-10-CM | POA: Diagnosis not present

## 2012-03-27 DIAGNOSIS — M25519 Pain in unspecified shoulder: Secondary | ICD-10-CM | POA: Diagnosis not present

## 2012-03-27 DIAGNOSIS — G5730 Lesion of lateral popliteal nerve, unspecified lower limb: Secondary | ICD-10-CM | POA: Diagnosis not present

## 2012-04-10 ENCOUNTER — Other Ambulatory Visit: Payer: Self-pay

## 2012-04-10 MED ORDER — METOPROLOL TARTRATE 25 MG PO TABS: 25.0000 mg | ORAL_TABLET | Freq: Two times a day (BID) | ORAL | Status: DC

## 2012-04-25 DIAGNOSIS — M1990 Unspecified osteoarthritis, unspecified site: Secondary | ICD-10-CM | POA: Diagnosis not present

## 2012-04-25 DIAGNOSIS — G5730 Lesion of lateral popliteal nerve, unspecified lower limb: Secondary | ICD-10-CM | POA: Diagnosis not present

## 2012-04-25 DIAGNOSIS — M47817 Spondylosis without myelopathy or radiculopathy, lumbosacral region: Secondary | ICD-10-CM | POA: Diagnosis not present

## 2012-04-25 DIAGNOSIS — M25519 Pain in unspecified shoulder: Secondary | ICD-10-CM | POA: Diagnosis not present

## 2012-05-16 ENCOUNTER — Other Ambulatory Visit: Payer: Self-pay | Admitting: Family Medicine

## 2012-05-24 ENCOUNTER — Ambulatory Visit (INDEPENDENT_AMBULATORY_CARE_PROVIDER_SITE_OTHER): Payer: 59 | Admitting: Cardiology

## 2012-05-24 ENCOUNTER — Encounter: Payer: Self-pay | Admitting: Cardiology

## 2012-05-24 VITALS — BP 112/76 | HR 72 | Ht 67.0 in | Wt 234.8 lb

## 2012-05-24 DIAGNOSIS — I10 Essential (primary) hypertension: Secondary | ICD-10-CM

## 2012-05-24 DIAGNOSIS — E7800 Pure hypercholesterolemia, unspecified: Secondary | ICD-10-CM

## 2012-05-24 DIAGNOSIS — I498 Other specified cardiac arrhythmias: Secondary | ICD-10-CM | POA: Diagnosis not present

## 2012-05-24 DIAGNOSIS — G5730 Lesion of lateral popliteal nerve, unspecified lower limb: Secondary | ICD-10-CM | POA: Diagnosis not present

## 2012-05-24 DIAGNOSIS — E039 Hypothyroidism, unspecified: Secondary | ICD-10-CM

## 2012-05-24 DIAGNOSIS — M47817 Spondylosis without myelopathy or radiculopathy, lumbosacral region: Secondary | ICD-10-CM | POA: Diagnosis not present

## 2012-05-24 DIAGNOSIS — M1990 Unspecified osteoarthritis, unspecified site: Secondary | ICD-10-CM | POA: Diagnosis not present

## 2012-05-24 LAB — CBC WITH DIFFERENTIAL/PLATELET
Basophils Absolute: 0 10*3/uL (ref 0.0–0.1)
Eosinophils Absolute: 0.2 10*3/uL (ref 0.0–0.7)
HCT: 38 % (ref 36.0–46.0)
Hemoglobin: 12.5 g/dL (ref 12.0–15.0)
Lymphs Abs: 2.3 10*3/uL (ref 0.7–4.0)
MCHC: 32.8 g/dL (ref 30.0–36.0)
MCV: 92 fl (ref 78.0–100.0)
Monocytes Absolute: 0.6 10*3/uL (ref 0.1–1.0)
Neutro Abs: 3.6 10*3/uL (ref 1.4–7.7)
Platelets: 203 10*3/uL (ref 150.0–400.0)
RDW: 13.9 % (ref 11.5–14.6)

## 2012-05-24 LAB — BASIC METABOLIC PANEL
BUN: 21 mg/dL (ref 6–23)
Creatinine, Ser: 0.9 mg/dL (ref 0.4–1.2)
GFR: 66.76 mL/min (ref >60.00)
Glucose, Bld: 97 mg/dL (ref 70–99)
Potassium: 3.9 mEq/L (ref 3.5–5.1)

## 2012-05-24 LAB — HEPATIC FUNCTION PANEL
Albumin: 3.7 g/dL (ref 3.5–5.2)
Alkaline Phosphatase: 66 U/L (ref 39–117)
Total Protein: 7 g/dL (ref 6.0–8.3)

## 2012-05-24 LAB — LIPID PANEL
Cholesterol: 213 mg/dL — ABNORMAL HIGH (ref 0–200)
HDL: 50.5 mg/dL (ref >39.00)
Triglycerides: 158 mg/dL — ABNORMAL HIGH (ref 0.0–149.0)

## 2012-05-24 NOTE — Progress Notes (Signed)
[name] Date of Birth: [birth year]   History of Present Illness: [name] is seen today for followup today. She reports that about 2 weeks ago she had symptoms of palpitations off and on for 2-3 days. The symptoms then resolved. There were no clear aggravating factors. She remains on her metoprolol. She does report quitting smoking in March. She denies any chest pain or shortness of breath.  Current Outpatient Prescriptions on File Prior to Visit  Medication Sig Dispense Refill  . ARMOUR THYROID 60 MG tablet TAKE 1 TABLET BY MOUTH EVERY MORNING 30 MINUTES BEFORE BREAKFAST WITH 30MG  TABLET  90 tablet  0  . meclizine (ANTIVERT) 25 MG tablet TAKE 1 TABLET 3 TIMES A DAY AS NEEDED FOR DIZZINESS OR NAUSEA  30 tablet  3  . metoprolol tartrate (LOPRESSOR) 25 MG tablet Take 1 tablet (25 mg total) by mouth 2 (two) times daily.  60 tablet  5  . Oxycodone HCl 20 MG TABS Take by mouth 2 (two) times daily.      . sertraline (ZOLOFT) 50 MG tablet Take 1 tablet (50 mg total) by mouth daily.  90 tablet  3  . spironolactone (ALDACTONE) 25 MG tablet Take 1 tablet (25 mg total) by mouth daily.  90 tablet  4    Allergies  Allergen Reactions  . Amoxicillin-Pot Clavulanate     REACTION: diarrhea  . Aspirin   . Atorvastatin     REACTION: feel weak  . Carisoprodol     REACTION: intolerant  . Codeine     REACTION: pt. reports allergy to codeine, reaction not known  . Codeine Phosphate     REACTION: UNKNOWN  . Cortisone     REACTION: pt. reports allergy, reaction not known  . Ezetimibe-Simvastatin     REACTION: feels weak  . Levothyroxine Sodium     REACTION: reaction not known  . Naproxen Sodium     REACTION: GI symptoms  . Omeprazole     REACTION: abd pain  . Quinapril Hcl     REACTION: reports allergy, reaction not known  . Rofecoxib     REACTION: reports allergy, reaction not known    Past Medical History  Diagnosis Date  . Hypertension   . Hyperlipidemia   . PVC's (premature  ventricular contractions)   . Tobacco dependence   . IBS (irritable bowel syndrome)   . Diverticulitis   . Palpitations   . Chronic pain     Managed by Dr. Vear Clock  . SVT (supraventricular tachycardia)     Past Surgical History  Procedure Date  . Tonsillectomy   . Nasal sinus surgery   . Wrist fracture surgery   . Replacement total knee     History  Smoking status  . Former Smoker -- 0.5 packs/day  . Quit date: 11/11/2011  Smokeless tobacco  . Never Used  Comment: Quit 11/22/11.    History  Alcohol Use No    Family History  Problem Relation Age of Onset  . Diabetes Mother   . Heart attack Father   . Heart disease Sister     Review of Systems: The review of systems is as above.   All other systems were reviewed and are negative.  Physical Exam: BP 112/76  Pulse 72  Ht 5\' 7"  (1.702 m)  Wt 234 lb 12.8 oz (106.505 kg)  BMI 36.78 kg/m2 Patient is in no acute distress.  She is obese. Skin is warm and dry. Color is normal.  HEENT  is unremarkable. Normocephalic/atraumatic. PERRL. Sclera are nonicteric. Neck is supple. No masses. No JVD. Lungs are clear. Cardiac exam shows a regular rate and rhythm.  Abdomen is soft and obese. Extremities are without edema. She walks with a cane.  No gross neurologic deficits noted.  LABORATORY DATA:   ECG today demonstrates normal sinus rhythm with a normal ECG.  Assessment / Plan: 1. Supraventricular tachycardia. I think this is been fairly well controlled on her current dose of metoprolol. We will continue same dose unless she were to have more frequent breakthrough. We will check lab work today including chemistries, CBC, and TSH.  2. PVCs.  3. Hypertension, controlled.  4. History of tobacco abuse now quit.  5. Hypothyroidism

## 2012-05-24 NOTE — Patient Instructions (Signed)
Continue your current medication  We will check lab work today.  I will see you in 6 months.

## 2012-06-14 ENCOUNTER — Other Ambulatory Visit: Payer: Self-pay | Admitting: Family Medicine

## 2012-06-14 NOTE — Telephone Encounter (Signed)
Ok to refill 

## 2012-06-14 NOTE — Telephone Encounter (Signed)
Please give her refils for 12 months, thanks

## 2012-07-12 ENCOUNTER — Ambulatory Visit (INDEPENDENT_AMBULATORY_CARE_PROVIDER_SITE_OTHER): Payer: 59 | Admitting: Family Medicine

## 2012-07-12 ENCOUNTER — Encounter: Payer: Self-pay | Admitting: Family Medicine

## 2012-07-12 VITALS — BP 126/74 | HR 66 | Temp 97.7°F | Ht 67.0 in | Wt 235.5 lb

## 2012-07-12 DIAGNOSIS — R252 Cramp and spasm: Secondary | ICD-10-CM

## 2012-07-12 DIAGNOSIS — Z23 Encounter for immunization: Secondary | ICD-10-CM

## 2012-07-12 NOTE — Assessment & Plan Note (Signed)
Recurrent in thighs and hands  Reassuring exam today- likely multifactorial  Will try inc her tonic water - see AVS Also magnesium supplement may help Discussed staying active and demonstrated some stretches also today

## 2012-07-12 NOTE — Progress Notes (Signed)
Subjective:    Patient ID: [name], female    DOB: [birth year], 76 y.o.   MRN: [id number]  HPI Here with cramps in her legs and hands lately   Gets them both day and night  Is drinking tonic water with quinine - helped for a while  Drinks about 2 oz once per day   In thighs - a cramp will last a minute or less - and then back up - then leaves them sore  In her fingers - about the same  2-3 times per week  ? If differs with activity - perhaps worse after activity  Can walk for exercise  Does not do any strething     Chemistry      Component Value Date/Time   NA 139 05/24/2012 0954   K 3.9 05/24/2012 0954   CL 105 05/24/2012 0954   CO2 26 05/24/2012 0954   BUN 21 05/24/2012 0954   CREATININE 0.9 05/24/2012 0954      Component Value Date/Time   CALCIUM 8.9 05/24/2012 0954   ALKPHOS 66 05/24/2012 0954   AST 22 05/24/2012 0954   ALT 17 05/24/2012 0954   BILITOT 0.4 05/24/2012 0954      No reason for cramps   Lab Results  Component Value Date   TSH 0.78 05/24/2012    Patient Active Problem List  Diagnosis  . HYPOTHYROIDISM  . HYPERCHOLESTEROLEMIA  . OBESITY  . HYPERTENSION  . SUPRAVENTRICULAR TACHYCARDIA  . ALLERGIC RHINITIS  . COPD  . OSTEOARTHRITIS  . BACK PAIN, CHRONIC  . VERTIGO  . LEG PAIN, LEFT  . Pre-syncope  . Left foot pain  . Depression  . Bronchitis with obstruction   Past Medical History  Diagnosis Date  . Hypertension   . Hyperlipidemia   . PVC's (premature ventricular contractions)   . Tobacco dependence   . IBS (irritable bowel syndrome)   . Diverticulitis   . Palpitations   . Chronic pain     Managed by Dr. Vear Clock  . SVT (supraventricular tachycardia)    Past Surgical History  Procedure Date  . Tonsillectomy   . Nasal sinus surgery   . Wrist fracture surgery   . Replacement total knee    History  Substance Use Topics  . Smoking status: Former Smoker -- 0.5 packs/day    Quit date: 11/11/2011  . Smokeless tobacco: Never Used       Comment: Quit 11/22/11.  [name] Alcohol Use: No   Family History  Problem Relation Age of Onset  . Diabetes Mother   . Heart attack Father   . Heart disease Sister    Allergies  Allergen Reactions  . Amoxicillin-Pot Clavulanate     REACTION: diarrhea  . Aspirin   . Atorvastatin     REACTION: feel weak  . Carisoprodol     REACTION: intolerant  . Codeine     REACTION: pt. reports allergy to codeine, reaction not known  . Codeine Phosphate     REACTION: UNKNOWN  . Cortisone     REACTION: pt. reports allergy, reaction not known  . Ezetimibe-Simvastatin     REACTION: feels weak  . Levothyroxine Sodium     REACTION: reaction not known  . Naproxen Sodium     REACTION: GI symptoms  . Omeprazole     REACTION: abd pain  . Quinapril Hcl     REACTION: reports allergy, reaction not known  . Rofecoxib     REACTION: reports allergy, reaction not known  Current Outpatient Prescriptions on File Prior to Visit  Medication Sig Dispense Refill  . ARMOUR THYROID 60 MG tablet TAKE 1 TABLET BY MOUTH EVERY MORNING 30 MINUTES BEFORE BREAKFAST WITH 30MG  TABLET  90 tablet  0  . meclizine (ANTIVERT) 25 MG tablet TAKE 1 TABLET 3 TIMES A DAY AS NEEDED FOR DIZZINESS OR NAUSEA  30 tablet  3  . metoprolol tartrate (LOPRESSOR) 25 MG tablet Take 1 tablet (25 mg total) by mouth 2 (two) times daily.  60 tablet  5  . Oxycodone HCl 20 MG TABS Take by mouth 2 (two) times daily.      . sertraline (ZOLOFT) 50 MG tablet TAKE 1 TABLET BY MOUTH EVERY DAY  90 tablet  3  . spironolactone (ALDACTONE) 25 MG tablet Take 1 tablet (25 mg total) by mouth daily.  90 tablet  4    Review of Systems Review of Systems  Constitutional: Negative for fever, appetite change, fatigue and unexpected weight change.  Eyes: Negative for pain and visual disturbance.  Respiratory: Negative for cough and shortness of breath.   Cardiovascular: Negative for cp or palpitations    Gastrointestinal: Negative for nausea, diarrhea and  constipation.  Genitourinary: Negative for urgency and frequency.  Skin: Negative for pallor or rash   MSK pos for cramps in hands and feet  Neurological: Negative for weakness, light-headedness, numbness and headaches.  Hematological: Negative for adenopathy. Does not bruise/bleed easily.  Psychiatric/Behavioral: Negative for dysphoric mood. The patient is not nervous/anxious.         Objective:   Physical Exam  Constitutional: She appears well-developed and well-nourished. No distress.       obese and well appearing   HENT:  Head: Normocephalic and atraumatic.  Mouth/Throat: Oropharynx is clear and moist.  Eyes: Conjunctivae normal and EOM are normal. Pupils are equal, round, and reactive to light.  Neck: Normal range of motion. Neck supple. No thyromegaly present.  Cardiovascular: Normal rate, regular rhythm and intact distal pulses.  Exam reveals no gallop.   Pulmonary/Chest: Effort normal and breath sounds normal. No respiratory distress. She has no wheezes.  Abdominal: Soft. Bowel sounds are normal. She exhibits no distension and no mass. There is no tenderness.  Musculoskeletal: She exhibits no edema and no tenderness.       Changes of OA noted  No muscle atrophy  Lymphadenopathy:    She has no cervical adenopathy.  Neurological: She is alert. She has normal reflexes. She displays no atrophy and no tremor. No cranial nerve deficit. She exhibits normal muscle tone. Coordination normal.  Skin: Skin is warm and dry. No rash noted. No erythema. No pallor.  Psychiatric: She has a normal mood and affect.          Assessment & Plan:

## 2012-07-12 NOTE — Patient Instructions (Addendum)
For muscle cramps in hands and thighs try to keep your muscles warm and also stretch-- try bending knees and bringing leg to the body to stretch your thigh and for hands- keep very busy - (keep them moving)  Increase your tonic water to 4 ounces in am and pm (8 oz total) - if this does not help, do not continue it  Magnesium helps some people - you can buy a supplement over the counter and take it daily  Flu shot today  Follow up in 6 months for annual exam with labs prior

## 2012-07-19 DIAGNOSIS — M47817 Spondylosis without myelopathy or radiculopathy, lumbosacral region: Secondary | ICD-10-CM | POA: Diagnosis not present

## 2012-07-24 ENCOUNTER — Other Ambulatory Visit: Payer: Self-pay | Admitting: Family Medicine

## 2012-08-03 ENCOUNTER — Other Ambulatory Visit: Payer: Self-pay | Admitting: Family Medicine

## 2012-08-14 DIAGNOSIS — M1990 Unspecified osteoarthritis, unspecified site: Secondary | ICD-10-CM | POA: Diagnosis not present

## 2012-08-14 DIAGNOSIS — G5730 Lesion of lateral popliteal nerve, unspecified lower limb: Secondary | ICD-10-CM | POA: Diagnosis not present

## 2012-08-14 DIAGNOSIS — M47817 Spondylosis without myelopathy or radiculopathy, lumbosacral region: Secondary | ICD-10-CM | POA: Diagnosis not present

## 2012-09-11 DIAGNOSIS — M1990 Unspecified osteoarthritis, unspecified site: Secondary | ICD-10-CM | POA: Diagnosis not present

## 2012-09-11 DIAGNOSIS — M47817 Spondylosis without myelopathy or radiculopathy, lumbosacral region: Secondary | ICD-10-CM | POA: Diagnosis not present

## 2012-09-18 ENCOUNTER — Other Ambulatory Visit: Payer: Self-pay | Admitting: Family Medicine

## 2012-10-02 ENCOUNTER — Telehealth: Payer: Self-pay

## 2012-10-02 NOTE — Telephone Encounter (Signed)
Pt received letter from [facility] that pt will need a prior auth for Armour thyroid before can be refilled again. Refill not due until March; Pt has misplaced the letter and pharmacy said they will not get a letter and cannot get PA info until send in refill in March. Pt wanted to know if Dr Milinda Antis received the same letter pt received and if so please call # on letter to begin PA process. If Dr Milinda Antis did not receive letter from [facility] pt will wait until refill in March to get PA.Please advise.

## 2012-10-02 NOTE — Telephone Encounter (Signed)
I did not get the letter - so let us know when you need to initiate the prior auth

## 2012-10-03 NOTE — Telephone Encounter (Signed)
Pt.notified

## 2012-10-13 ENCOUNTER — Other Ambulatory Visit: Payer: Self-pay

## 2012-10-13 MED ORDER — METOPROLOL TARTRATE 25 MG PO TABS: 25.0000 mg | ORAL_TABLET | Freq: Two times a day (BID) | ORAL | Status: DC

## 2012-11-16 ENCOUNTER — Ambulatory Visit: Payer: 59 | Admitting: Cardiology

## 2012-11-16 DIAGNOSIS — M47817 Spondylosis without myelopathy or radiculopathy, lumbosacral region: Secondary | ICD-10-CM | POA: Diagnosis not present

## 2012-11-16 DIAGNOSIS — Z79899 Other long term (current) drug therapy: Secondary | ICD-10-CM | POA: Diagnosis not present

## 2012-11-16 DIAGNOSIS — M25519 Pain in unspecified shoulder: Secondary | ICD-10-CM | POA: Diagnosis not present

## 2012-11-16 DIAGNOSIS — G5730 Lesion of lateral popliteal nerve, unspecified lower limb: Secondary | ICD-10-CM | POA: Diagnosis not present

## 2012-11-23 ENCOUNTER — Telehealth: Payer: Self-pay | Admitting: *Deleted

## 2012-11-23 NOTE — Telephone Encounter (Signed)
Ok- sounds good - will do when back in the office

## 2012-11-23 NOTE — Telephone Encounter (Signed)
We received letter with PA form saying they are no longer covering pt's thyroid med, you left note on form requesting pt to come in for an appt to discuss alt. medications, when I called pt she refused to scheduled an appt and said she doesn't want to change medication. Pt said that she has tried a few thyroid medications before starting the armour thyroid med and she doesn't want to change it, pt said if she has too she will pay out of pocket if insurance will not cover it but would like [place] to try to do a Prio Auth on med, form placed back in your inbox

## 2012-11-29 NOTE — Telephone Encounter (Signed)
She will have to pay out of pocket-I will not be able to get it covered for her

## 2012-11-29 NOTE — Telephone Encounter (Signed)
Received letter from ins. Saying they need additional info before they would approve pt's med, form placed in your inbox

## 2012-11-30 DIAGNOSIS — M25519 Pain in unspecified shoulder: Secondary | ICD-10-CM | POA: Diagnosis not present

## 2012-11-30 NOTE — Telephone Encounter (Signed)
Left voicemail letting pt know PA was declined and will have to pay out of pocket and if she changes her mind and wants to discuss alt meds then give [place] a call and scheduled f/u with Dr. Milinda Antis

## 2012-12-07 ENCOUNTER — Telehealth: Payer: Self-pay | Admitting: Family Medicine

## 2012-12-07 DIAGNOSIS — Z87891 Personal history of nicotine dependence: Secondary | ICD-10-CM

## 2012-12-07 NOTE — Telephone Encounter (Signed)
I don't remember seeing it - but if she quit that is great!

## 2012-12-07 NOTE — Telephone Encounter (Signed)
Pt said her insurance faxed over a form verifying that she has stopped smoking, I didn't see anything documented in her chart, did you received a form?  *I already advise pt to have ins re-fax form and gave her my direct fax #, I just wanted to make sure you didn't already have it

## 2012-12-12 NOTE — Telephone Encounter (Signed)
Letter in IN box-thanks

## 2012-12-12 NOTE — Telephone Encounter (Signed)
Letter faxed.

## 2012-12-12 NOTE — Telephone Encounter (Signed)
Pt quit smoking on 11/11/11, which is the date that we have in our system, pt request [place] to fax over the letter to the insurance company so they can get that info back asap, letter placed back in your inbox

## 2012-12-14 DIAGNOSIS — M25519 Pain in unspecified shoulder: Secondary | ICD-10-CM | POA: Diagnosis not present

## 2012-12-14 DIAGNOSIS — M47817 Spondylosis without myelopathy or radiculopathy, lumbosacral region: Secondary | ICD-10-CM | POA: Diagnosis not present

## 2012-12-14 DIAGNOSIS — G5730 Lesion of lateral popliteal nerve, unspecified lower limb: Secondary | ICD-10-CM | POA: Diagnosis not present

## 2012-12-18 ENCOUNTER — Telehealth: Payer: Self-pay | Admitting: Family Medicine

## 2012-12-18 ENCOUNTER — Encounter: Payer: Self-pay | Admitting: *Deleted

## 2012-12-18 NOTE — Telephone Encounter (Signed)
Pt's insurance declined her non-tobacco discount because they said we put the wrong quit date on her form, we put 11/10/12 and it should have been 11/11/11, please re-write letter and i will re-fax it to [phone], insurance said we just need to put on cover sheet that it was our error and the 11/11/11 date is correct and put her policy # [id number] on the cover sheet as well, I notified pt what insurance said and advise her we will re-send letter

## 2012-12-18 NOTE — Telephone Encounter (Signed)
Thanks- I will put the letter in the IN box

## 2012-12-19 NOTE — Telephone Encounter (Signed)
New letter faxed

## 2013-01-11 ENCOUNTER — Ambulatory Visit (INDEPENDENT_AMBULATORY_CARE_PROVIDER_SITE_OTHER): Payer: 59 | Admitting: Cardiology

## 2013-01-11 ENCOUNTER — Encounter: Payer: Self-pay | Admitting: Cardiology

## 2013-01-11 VITALS — BP 126/62 | HR 58 | Ht 67.0 in | Wt 235.8 lb

## 2013-01-11 DIAGNOSIS — I498 Other specified cardiac arrhythmias: Secondary | ICD-10-CM | POA: Diagnosis not present

## 2013-01-11 DIAGNOSIS — E039 Hypothyroidism, unspecified: Secondary | ICD-10-CM

## 2013-01-11 DIAGNOSIS — E7800 Pure hypercholesterolemia, unspecified: Secondary | ICD-10-CM

## 2013-01-11 DIAGNOSIS — I10 Essential (primary) hypertension: Secondary | ICD-10-CM | POA: Diagnosis not present

## 2013-01-11 NOTE — Patient Instructions (Signed)
Continue your current therapy  I will see you in 6 months.   

## 2013-01-11 NOTE — Progress Notes (Signed)
[name] Date of Birth: Apr 03, 1936   History of Present Illness: [name] is seen today for followup today. She is doing very well from a cardiac standpoint. She denies any symptoms of chest pain, palpitations, or shortness of breath. She quit smoking in March 2013. She does complain of some cramps in her hands, feet, and legs.  Current Outpatient Prescriptions on File Prior to Visit  Medication Sig Dispense Refill  . ARMOUR THYROID 60 MG tablet TAKE 1 TABLET BY MOUTH EVERY MORNING 30 MINUTES BEFORE BREAKFAST WITH 30MG  TABLET  90 tablet  1  . meclizine (ANTIVERT) 25 MG tablet TAKE 1 TABLET 3 TIMES A DAY AS NEEDED FOR DIZZINESS OR NAUSEA  30 tablet  2  . metoprolol tartrate (LOPRESSOR) 25 MG tablet Take 1 tablet (25 mg total) by mouth 2 (two) times daily.  60 tablet  5  . Oxycodone HCl 20 MG TABS Take by mouth 2 (two) times daily.      . pantoprazole (PROTONIX) 40 MG tablet daily.       . sertraline (ZOLOFT) 50 MG tablet TAKE 1 TABLET BY MOUTH EVERY DAY  90 tablet  3  . spironolactone (ALDACTONE) 25 MG tablet TAKE 1 TABLET EVERY DAY  90 tablet  1   No current facility-administered medications on file prior to visit.    Allergies  Allergen Reactions  . Amoxicillin-Pot Clavulanate     REACTION: diarrhea  . Aspirin   . Atorvastatin     REACTION: feel weak  . Carisoprodol     REACTION: intolerant  . Codeine     REACTION: pt. reports allergy to codeine, reaction not known  . Codeine Phosphate     REACTION: UNKNOWN  . Cortisone     REACTION: pt. reports allergy, reaction not known  . Ezetimibe-Simvastatin     REACTION: feels weak  . Levothyroxine Sodium     REACTION: reaction not known  . Naproxen Sodium     REACTION: GI symptoms  . Omeprazole     REACTION: abd pain  . Quinapril Hcl     REACTION: reports allergy, reaction not known  . Rofecoxib     REACTION: reports allergy, reaction not known    Past Medical History  Diagnosis Date  . Hypertension   .  Hyperlipidemia   . PVC's (premature ventricular contractions)   . Tobacco dependence   . IBS (irritable bowel syndrome)   . Diverticulitis   . Palpitations   . Chronic pain     Managed by Dr. Vear Clock  . SVT (supraventricular tachycardia)     Past Surgical History  Procedure Laterality Date  . Tonsillectomy    . Nasal sinus surgery    . Wrist fracture surgery    . Replacement total knee      History  Smoking status  . Former Smoker -- 0.50 packs/day  . Quit date: 11/11/2011  Smokeless tobacco  . Never Used    Comment: Quit 11/22/11.    History  Alcohol Use No    Family History  Problem Relation Age of Onset  . Diabetes Mother   . Heart attack Father   . Heart disease Sister     Review of Systems: The review of systems is as above.   All other systems were reviewed and are negative.  Physical Exam: BP 126/62  Pulse 58  Ht 5\' 7"  (1.702 m)  Wt 235 lb 12.8 oz (106.958 kg)  BMI 36.92 kg/m2  SpO2 96% Patient is  in no acute distress.  She is obese. Skin is warm and dry. Color is normal.  HEENT is unremarkable. Normocephalic/atraumatic. PERRL. Sclera are nonicteric. Neck is supple. No masses. No JVD. Lungs are clear. Cardiac exam shows a regular rate and rhythm.  Abdomen is soft and obese. Extremities are without edema. She walks with a cane.  No gross neurologic deficits noted.  LABORATORY DATA:   Lab Results  Component Value Date   WBC 6.6 05/24/2012   HGB 12.5 05/24/2012   HCT 38.0 05/24/2012   PLT 203.0 05/24/2012   GLUCOSE 97 05/24/2012   CHOL 213* 05/24/2012   TRIG 158.0* 05/24/2012   HDL 50.50 05/24/2012   LDLDIRECT 136.4 05/24/2012   ALT 17 05/24/2012   AST 22 05/24/2012   NA 139 05/24/2012   K 3.9 05/24/2012   CL 105 05/24/2012   CREATININE 0.9 05/24/2012   BUN 21 05/24/2012   CO2 26 05/24/2012   TSH 0.78 05/24/2012   INR 1.00 12/10/2010     Assessment / Plan: 1. Supraventricular tachycardia. Well controlled on her current dose of metoprolol. We will continue  same dose unless she were to have more frequent breakthrough.   2. PVCs.  3. Hypertension, controlled.  4. History of tobacco abuse now quit.  5. Hypothyroidism  6. Cramps. I think this is mostly related to her arthritis. Previous lab work was unremarkable.

## 2013-01-29 ENCOUNTER — Other Ambulatory Visit: Payer: Self-pay | Admitting: Family Medicine

## 2013-01-29 NOTE — Telephone Encounter (Signed)
Letter mailed with pt's appt time per pt request

## 2013-01-29 NOTE — Telephone Encounter (Signed)
Please schedule f/u appt for November and refill until then

## 2013-01-29 NOTE — Telephone Encounter (Signed)
Electronic refill request, no recent appt and no future appt, please advise 

## 2013-01-29 NOTE — Telephone Encounter (Signed)
appt scheduled and med refilled 

## 2013-01-30 DIAGNOSIS — B351 Tinea unguium: Secondary | ICD-10-CM | POA: Diagnosis not present

## 2013-01-30 DIAGNOSIS — M2040 Other hammer toe(s) (acquired), unspecified foot: Secondary | ICD-10-CM | POA: Diagnosis not present

## 2013-01-30 DIAGNOSIS — M216X9 Other acquired deformities of unspecified foot: Secondary | ICD-10-CM | POA: Diagnosis not present

## 2013-01-30 DIAGNOSIS — M79609 Pain in unspecified limb: Secondary | ICD-10-CM | POA: Diagnosis not present

## 2013-02-09 DIAGNOSIS — M47817 Spondylosis without myelopathy or radiculopathy, lumbosacral region: Secondary | ICD-10-CM | POA: Diagnosis not present

## 2013-02-09 DIAGNOSIS — Z79899 Other long term (current) drug therapy: Secondary | ICD-10-CM | POA: Diagnosis not present

## 2013-02-09 DIAGNOSIS — M25519 Pain in unspecified shoulder: Secondary | ICD-10-CM | POA: Diagnosis not present

## 2013-02-13 ENCOUNTER — Other Ambulatory Visit: Payer: Self-pay | Admitting: Family Medicine

## 2013-02-21 ENCOUNTER — Other Ambulatory Visit: Payer: Self-pay | Admitting: *Deleted

## 2013-02-21 MED ORDER — THYROID 60 MG PO TABS: ORAL_TABLET | ORAL | Status: DC

## 2013-02-21 NOTE — Telephone Encounter (Signed)
[facility] called and verified that pt is only taking Armour Thyroid 60 mg once a day and isn't taking it with a 30 mg cap too as the directions said. I advise pt is only taking the 60 mg and not a 30mg  Cap too

## 2013-03-06 ENCOUNTER — Ambulatory Visit (INDEPENDENT_AMBULATORY_CARE_PROVIDER_SITE_OTHER): Payer: 59 | Admitting: Family Medicine

## 2013-03-06 ENCOUNTER — Encounter: Payer: Self-pay | Admitting: Family Medicine

## 2013-03-06 VITALS — BP 114/72 | HR 62 | Temp 98.4°F | Ht 67.0 in | Wt 231.5 lb

## 2013-03-06 DIAGNOSIS — N390 Urinary tract infection, site not specified: Secondary | ICD-10-CM | POA: Diagnosis not present

## 2013-03-06 DIAGNOSIS — I10 Essential (primary) hypertension: Secondary | ICD-10-CM

## 2013-03-06 DIAGNOSIS — E7800 Pure hypercholesterolemia, unspecified: Secondary | ICD-10-CM | POA: Diagnosis not present

## 2013-03-06 DIAGNOSIS — Z79899 Other long term (current) drug therapy: Secondary | ICD-10-CM | POA: Diagnosis not present

## 2013-03-06 DIAGNOSIS — E039 Hypothyroidism, unspecified: Secondary | ICD-10-CM

## 2013-03-06 DIAGNOSIS — R109 Unspecified abdominal pain: Secondary | ICD-10-CM | POA: Diagnosis not present

## 2013-03-06 DIAGNOSIS — M47817 Spondylosis without myelopathy or radiculopathy, lumbosacral region: Secondary | ICD-10-CM | POA: Diagnosis not present

## 2013-03-06 DIAGNOSIS — G5730 Lesion of lateral popliteal nerve, unspecified lower limb: Secondary | ICD-10-CM | POA: Diagnosis not present

## 2013-03-06 LAB — CBC WITH DIFFERENTIAL/PLATELET
Basophils Relative: 0.5 % (ref 0.0–3.0)
Eosinophils Absolute: 0.1 10*3/uL (ref 0.0–0.7)
Eosinophils Relative: 1.6 % (ref 0.0–5.0)
HCT: 40.4 % (ref 36.0–46.0)
Lymphocytes Relative: 41.4 % (ref 12.0–46.0)
Neutro Abs: 3.8 10*3/uL (ref 1.4–7.7)

## 2013-03-06 LAB — POCT URINALYSIS DIPSTICK
Nitrite, UA: NEGATIVE
Urobilinogen, UA: 0.2
pH, UA: 6

## 2013-03-06 LAB — T3, FREE: T3, Free: 2.8 pg/mL (ref 2.3–4.2)

## 2013-03-06 LAB — LIPASE: Lipase: 22 U/L (ref 11.0–59.0)

## 2013-03-06 LAB — COMPREHENSIVE METABOLIC PANEL
AST: 23 U/L (ref 0–37)
Albumin: 3.9 g/dL (ref 3.5–5.2)
BUN: 11 mg/dL (ref 6–23)
Calcium: 9.4 mg/dL (ref 8.4–10.5)
Chloride: 105 mEq/L (ref 96–112)
Creatinine, Ser: 1 mg/dL (ref 0.4–1.2)
GFR: 60.99 mL/min (ref >60.00)
Glucose, Bld: 101 mg/dL — ABNORMAL HIGH (ref 70–99)
Potassium: 4.7 mEq/L (ref 3.5–5.1)

## 2013-03-06 LAB — POCT UA - MICROSCOPIC ONLY
Casts, Ur, LPF, POC: 0
Yeast, UA: 0

## 2013-03-06 LAB — LIPID PANEL
Cholesterol: 184 mg/dL (ref 0–200)
VLDL: 27.6 mg/dL (ref 0.0–40.0)

## 2013-03-06 LAB — TSH: TSH: 0.78 u[IU]/mL (ref 0.35–5.50)

## 2013-03-06 MED ORDER — CIPROFLOXACIN HCL 250 MG PO TABS: 250.0000 mg | ORAL_TABLET | Freq: Two times a day (BID) | ORAL | Status: DC

## 2013-03-06 NOTE — Patient Instructions (Addendum)
Drink lots of water  No more cranberry juice  Take cipro as directed for urinary tract infection We will call you about about urine culture  Labs today for other medical conditions Update if not starting to improve in a week or if worsening

## 2013-03-06 NOTE — Assessment & Plan Note (Signed)
bp in fair control at this time  No changes needed  Disc lifstyle change with low sodium diet and exercise   Labs today 

## 2013-03-06 NOTE — Assessment & Plan Note (Signed)
Lipid today Disc goals for lipids and reasons to control them Rev low sat fat diet in detail

## 2013-03-06 NOTE — Assessment & Plan Note (Signed)
Suspect this is cause of her abdominal/ flank pain  tx with cipro Enc good water intake  Will update if worse or no improvement Urine cx is pending

## 2013-03-06 NOTE — Progress Notes (Signed)
Subjective:    Patient ID: [name], female    DOB: 1936-07-13, 77 y.o.   MRN: [id number]  HPI Here with stomach discomfort - "on the sides" - not all the time Worse when she bends over  No worse on either sides Some burning to urinate - for several days at least No bladder pain Eating - may make symptoms worse - if she eats a lot of lettuce or nuts  (daughter has diverticulosis )  No heartburn  No v/d occ gets a little nauseated  She still has her gallbladder  Does not generally drink enough water  Has been drinking cranberry juice for several days-no improvement   Also frequency and urgency  No fever   She wants to loose weight - cannot get good exercise due to chronic pain     Her last colonoscopy was about 10 years ago -no memory of diverticulosis on that    Patient Active Problem List   Diagnosis Date Noted  . UTI (urinary tract infection) 03/06/2013  . Abdominal  pain, other specified site 03/06/2013  . Former smoker 12/12/2012  . Muscle cramps 07/12/2012  . Left foot pain 04/19/2011  . Depression 04/19/2011  . Pre-syncope 03/08/2011  . LEG PAIN, LEFT 08/28/2010  . OBESITY 10/03/2007  . VERTIGO 04/11/2007  . HYPOTHYROIDISM 12/27/2006  . HYPERCHOLESTEROLEMIA 12/27/2006  . HYPERTENSION 12/27/2006  . SUPRAVENTRICULAR TACHYCARDIA 12/27/2006  . ALLERGIC RHINITIS 12/27/2006  . COPD 12/27/2006  . OSTEOARTHRITIS 12/27/2006  . BACK PAIN, CHRONIC 12/27/2006   Past Medical History  Diagnosis Date  . Hypertension   . Hyperlipidemia   . PVC's (premature ventricular contractions)   . Tobacco dependence   . IBS (irritable bowel syndrome)   . Diverticulitis   . Palpitations   . Chronic pain     Managed by Dr. Vear Clock  . SVT (supraventricular tachycardia)    Past Surgical History  Procedure Laterality Date  . Tonsillectomy    . Nasal sinus surgery    . Wrist fracture surgery    . Replacement total knee     History  Substance Use Topics  .  Smoking status: Former Smoker -- 0.50 packs/day    Quit date: 11/11/2011  . Smokeless tobacco: Never Used     Comment: Quit 11/22/11.  [name] Alcohol Use: No   Family History  Problem Relation Age of Onset  . Diabetes Mother   . Heart attack Father   . Heart disease Sister    Allergies  Allergen Reactions  . Amoxicillin-Pot Clavulanate     REACTION: diarrhea  . Aspirin   . Atorvastatin     REACTION: feel weak  . Carisoprodol     REACTION: intolerant  . Codeine     REACTION: pt. reports allergy to codeine, reaction not known  . Codeine Phosphate     REACTION: UNKNOWN  . Cortisone     REACTION: pt. reports allergy, reaction not known  . Ezetimibe-Simvastatin     REACTION: feels weak  . Levothyroxine Sodium     REACTION: reaction not known  . Naproxen Sodium     REACTION: GI symptoms  . Omeprazole     REACTION: abd pain  . Quinapril Hcl     REACTION: reports allergy, reaction not known  . Rofecoxib     REACTION: reports allergy, reaction not known   Current Outpatient Prescriptions on File Prior to Visit  Medication Sig Dispense Refill  . meclizine (ANTIVERT) 25 MG tablet TAKE 1 TABLET 3 TIMES  A DAY AS NEEDED FOR DIZZINESS OR NAUSEA  30 tablet  2  . metoprolol tartrate (LOPRESSOR) 25 MG tablet Take 1 tablet (25 mg total) by mouth 2 (two) times daily.  60 tablet  5  . Oxycodone HCl 20 MG TABS Take by mouth 2 (two) times daily.      . pantoprazole (PROTONIX) 40 MG tablet daily.       . sertraline (ZOLOFT) 50 MG tablet TAKE 1 TABLET BY MOUTH EVERY DAY  90 tablet  3  . spironolactone (ALDACTONE) 25 MG tablet TAKE 1 TABLET EVERY DAY  90 tablet  1  . thyroid (ARMOUR THYROID) 60 MG tablet TAKE 1 TABLET BY MOUTH EVERY MORNING 30 MINUTES BEFORE BREAKFAST  90 tablet  1   No current facility-administered medications on file prior to visit.    Review of Systems Review of Systems  Constitutional: Negative for fever, appetite change, fatigue and unexpected weight change.  Eyes:  Negative for pain and visual disturbance.  Respiratory: Negative for cough and shortness of breath.   Cardiovascular: Negative for cp or palpitations    Gastrointestinal: Negative for nausea, diarrhea and constipation.  Genitourinary: pos for urgency and frequency. pos for flank pain  Skin: Negative for pallor or rash   MSK pos for chronic back pain with radiation  Neurological: Negative for weakness, light-headedness, numbness and headaches.  Hematological: Negative for adenopathy. Does not bruise/bleed easily.  Psychiatric/Behavioral: Negative for dysphoric mood. The patient is not nervous/anxious.         Objective:   Physical Exam  Constitutional: She appears well-developed and well-nourished. No distress.  obese and well appearing   HENT:  Head: Normocephalic and atraumatic.  Mouth/Throat: Oropharynx is clear and moist.  Eyes: Conjunctivae and EOM are normal. Pupils are equal, round, and reactive to light. Right eye exhibits no discharge. Left eye exhibits no discharge. No scleral icterus.  Neck: Normal range of motion. Neck supple. No JVD present. Carotid bruit is not present. No thyromegaly present.  Cardiovascular: Normal rate, regular rhythm and normal heart sounds.  Exam reveals no gallop.   Pulmonary/Chest: Breath sounds normal. No respiratory distress. She has no wheezes. She has no rales.  Diffusely distant bs   Abdominal: Soft. Bowel sounds are normal. She exhibits no distension, no abdominal bruit and no mass. There is tenderness. There is no rebound and no guarding.  Mild suprapubic tenderness Mild epigastric and RUQ tenderness  Mild R flank and CVA tenderness  Musculoskeletal: She exhibits no edema.  Lymphadenopathy:    She has no cervical adenopathy.  Neurological: She is alert. She has normal reflexes. No cranial nerve deficit. She exhibits normal muscle tone. Coordination normal.  Skin: Skin is warm and dry. No rash noted. No erythema. No pallor.  Psychiatric:  She has a normal mood and affect.          Assessment & Plan:

## 2013-03-06 NOTE — Assessment & Plan Note (Signed)
On armour thyroid T3  And tsh today Pt is having difficulty loosing wt

## 2013-03-06 NOTE — Assessment & Plan Note (Signed)
Suspect this is due to uti -but if no imp would consider [place] to assess gallbladder  Also lab today incl pancreas and liver enzymes Inst to continue her PPI and watch diet

## 2013-03-07 ENCOUNTER — Encounter: Payer: Self-pay | Admitting: *Deleted

## 2013-03-09 LAB — URINE CULTURE: Colony Count: 30000

## 2013-03-13 ENCOUNTER — Other Ambulatory Visit: Payer: Self-pay | Admitting: Family Medicine

## 2013-03-30 ENCOUNTER — Other Ambulatory Visit: Payer: Self-pay | Admitting: Family Medicine

## 2013-04-17 ENCOUNTER — Other Ambulatory Visit: Payer: Self-pay

## 2013-04-17 MED ORDER — METOPROLOL TARTRATE 25 MG PO TABS: 25.0000 mg | ORAL_TABLET | Freq: Two times a day (BID) | ORAL | Status: DC

## 2013-05-04 DIAGNOSIS — M47817 Spondylosis without myelopathy or radiculopathy, lumbosacral region: Secondary | ICD-10-CM | POA: Diagnosis not present

## 2013-05-04 DIAGNOSIS — M1990 Unspecified osteoarthritis, unspecified site: Secondary | ICD-10-CM | POA: Diagnosis not present

## 2013-05-04 DIAGNOSIS — Z79899 Other long term (current) drug therapy: Secondary | ICD-10-CM | POA: Diagnosis not present

## 2013-06-01 DIAGNOSIS — Z79899 Other long term (current) drug therapy: Secondary | ICD-10-CM | POA: Diagnosis not present

## 2013-06-01 DIAGNOSIS — M47817 Spondylosis without myelopathy or radiculopathy, lumbosacral region: Secondary | ICD-10-CM | POA: Diagnosis not present

## 2013-06-13 ENCOUNTER — Other Ambulatory Visit: Payer: Self-pay | Admitting: Family Medicine

## 2013-06-13 MED ORDER — SERTRALINE HCL 50 MG PO TABS: ORAL_TABLET | ORAL | Status: DC

## 2013-06-13 NOTE — Telephone Encounter (Signed)
Fax refill request, please advise  

## 2013-06-13 NOTE — Telephone Encounter (Signed)
Please refill for a year  

## 2013-06-13 NOTE — Telephone Encounter (Signed)
done

## 2013-06-26 ENCOUNTER — Ambulatory Visit: Payer: Self-pay | Admitting: Podiatry

## 2013-07-01 ENCOUNTER — Other Ambulatory Visit: Payer: Self-pay | Admitting: Family Medicine

## 2013-07-02 ENCOUNTER — Other Ambulatory Visit: Payer: Self-pay | Admitting: Anesthesiology

## 2013-07-02 DIAGNOSIS — R109 Unspecified abdominal pain: Secondary | ICD-10-CM

## 2013-07-02 DIAGNOSIS — M47817 Spondylosis without myelopathy or radiculopathy, lumbosacral region: Secondary | ICD-10-CM | POA: Diagnosis not present

## 2013-07-02 DIAGNOSIS — Z79899 Other long term (current) drug therapy: Secondary | ICD-10-CM | POA: Diagnosis not present

## 2013-07-02 DIAGNOSIS — M1990 Unspecified osteoarthritis, unspecified site: Secondary | ICD-10-CM | POA: Diagnosis not present

## 2013-07-03 ENCOUNTER — Ambulatory Visit (INDEPENDENT_AMBULATORY_CARE_PROVIDER_SITE_OTHER): Payer: 59 | Admitting: Family Medicine

## 2013-07-03 ENCOUNTER — Encounter: Payer: Self-pay | Admitting: Family Medicine

## 2013-07-03 VITALS — BP 126/82 | HR 64 | Temp 98.3°F | Ht 67.0 in | Wt 233.5 lb

## 2013-07-03 DIAGNOSIS — E7800 Pure hypercholesterolemia, unspecified: Secondary | ICD-10-CM

## 2013-07-03 DIAGNOSIS — E039 Hypothyroidism, unspecified: Secondary | ICD-10-CM | POA: Diagnosis not present

## 2013-07-03 DIAGNOSIS — E669 Obesity, unspecified: Secondary | ICD-10-CM

## 2013-07-03 DIAGNOSIS — I10 Essential (primary) hypertension: Secondary | ICD-10-CM | POA: Diagnosis not present

## 2013-07-03 DIAGNOSIS — Z23 Encounter for immunization: Secondary | ICD-10-CM | POA: Diagnosis not present

## 2013-07-03 DIAGNOSIS — R350 Frequency of micturition: Secondary | ICD-10-CM | POA: Diagnosis not present

## 2013-07-03 DIAGNOSIS — M549 Dorsalgia, unspecified: Secondary | ICD-10-CM

## 2013-07-03 LAB — POCT URINALYSIS DIPSTICK
Glucose, UA: NEGATIVE
Nitrite, UA: NEGATIVE
Spec Grav, UA: >=1.03
Urobilinogen, UA: 0.2
pH, UA: 6

## 2013-07-03 MED ORDER — MECLIZINE HCL 25 MG PO TABS: 25.0000 mg | ORAL_TABLET | Freq: Three times a day (TID) | ORAL | Status: DC | PRN

## 2013-07-03 MED ORDER — PANTOPRAZOLE SODIUM 40 MG PO TBEC: 40.0000 mg | DELAYED_RELEASE_TABLET | Freq: Every day | ORAL | Status: DC

## 2013-07-03 MED ORDER — SPIRONOLACTONE 25 MG PO TABS: 25.0000 mg | ORAL_TABLET | Freq: Every day | ORAL | Status: DC

## 2013-07-03 MED ORDER — METOPROLOL TARTRATE 25 MG PO TABS: 25.0000 mg | ORAL_TABLET | Freq: Two times a day (BID) | ORAL | Status: DC

## 2013-07-03 MED ORDER — SERTRALINE HCL 50 MG PO TABS: ORAL_TABLET | ORAL | Status: DC

## 2013-07-03 MED ORDER — THYROID 60 MG PO TABS: 60.0000 mg | ORAL_TABLET | Freq: Every day | ORAL | Status: DC

## 2013-07-03 NOTE — Progress Notes (Signed)
Subjective:    Patient ID: [name], female    DOB: 08-04-1936, 77 y.o.   MRN: [id number]  HPI Here for f/u of chronic health problems and med management  Having more bladder problems  She leaks all the time - not much  Wears a pad  May consider seeing urologist in the future  This goes on and off - as bad as wetting 3 times per night  ? If she may have infection  Some urgency - without a full bladder  occ dysuria  Some odor and no blood   Wt is up 2 lb   Mobility is an issue with chronic pain Takes oxycodone now bid and it is helpful  Epidural helped some recently   Flu vaccine - got that today   Mood- on zoloft and that is still working well   Her dizziness is not too bad -she manages with that     bp is stable today  No cp or palpitations or headaches or edema  No side effects to medicines  BP Readings from Last 3 Encounters:  07/03/13 126/82  03/06/13 114/72  01/11/13 126/62      Hyperlipidemia Lab Results  Component Value Date   CHOL 184 03/06/2013   HDL 41.80 03/06/2013   LDLCALC 115* 03/06/2013   LDLDIRECT 136.4 05/24/2012   TRIG 138.0 03/06/2013   CHOLHDL 4 03/06/2013     Hypothyroidism Lab Results  Component Value Date   TSH 0.78 03/06/2013   on armour thyroid- seems clinically stable  Though -has a hard time loosing wt  She is cutting calories Not very mobile re: exercise Would like to check in every 3 mo for visit and wt to keep her accountable Disc sugar cravings   Takes chronic pain medication   Patient Active Problem List   Diagnosis Date Noted  . UTI (urinary tract infection) 03/06/2013  . Abdominal  pain, other specified site 03/06/2013  . Former smoker 12/12/2012  . Muscle cramps 07/12/2012  . Left foot pain 04/19/2011  . Depression 04/19/2011  . Pre-syncope 03/08/2011  . LEG PAIN, LEFT 08/28/2010  . OBESITY 10/03/2007  . VERTIGO 04/11/2007  . HYPOTHYROIDISM 12/27/2006  . HYPERCHOLESTEROLEMIA 12/27/2006  . HYPERTENSION  12/27/2006  . SUPRAVENTRICULAR TACHYCARDIA 12/27/2006  . ALLERGIC RHINITIS 12/27/2006  . COPD 12/27/2006  . OSTEOARTHRITIS 12/27/2006  . BACK PAIN, CHRONIC 12/27/2006   Past Medical History  Diagnosis Date  . Hypertension   . Hyperlipidemia   . PVC's (premature ventricular contractions)   . Tobacco dependence   . IBS (irritable bowel syndrome)   . Diverticulitis   . Palpitations   . Chronic pain     Managed by Dr. Vear Clock  . SVT (supraventricular tachycardia)    Past Surgical History  Procedure Laterality Date  . Tonsillectomy    . Nasal sinus surgery    . Wrist fracture surgery    . Replacement total knee     History  Substance Use Topics  . Smoking status: Former Smoker -- 0.50 packs/day    Quit date: 11/11/2011  . Smokeless tobacco: Never Used     Comment: Quit 11/22/11.  [name] Alcohol Use: No   Family History  Problem Relation Age of Onset  . Diabetes Mother   . Heart attack Father   . Heart disease Sister    Allergies  Allergen Reactions  . Amoxicillin-Pot Clavulanate     REACTION: diarrhea  . Aspirin   . Atorvastatin     REACTION: feel  weak  . Carisoprodol     REACTION: intolerant  . Codeine     REACTION: pt. reports allergy to codeine, reaction not known  . Codeine Phosphate     REACTION: UNKNOWN  . Cortisone     REACTION: pt. reports allergy, reaction not known  . Ezetimibe-Simvastatin     REACTION: feels weak  . Levothyroxine Sodium     REACTION: reaction not known  . Naproxen Sodium     REACTION: GI symptoms  . Omeprazole     REACTION: abd pain  . Quinapril Hcl     REACTION: reports allergy, reaction not known  . Rofecoxib     REACTION: reports allergy, reaction not known   Current Outpatient Prescriptions on File Prior to Visit  Medication Sig Dispense Refill  . ARMOUR THYROID 60 MG tablet TAKE 1 TABLET BY MOUTH EVERY MORNING 30 MINUTES BEFORE BREAKFAST  90 tablet  1  . meclizine (ANTIVERT) 25 MG tablet TAKE 1 TABLET 3 TIMES A DAY AS  NEEDED FOR DIZZINESS OR NAUSEA  30 tablet  2  . metoprolol tartrate (LOPRESSOR) 25 MG tablet Take 1 tablet (25 mg total) by mouth 2 (two) times daily.  180 tablet  4  . Oxycodone HCl 20 MG TABS Take by mouth 2 (two) times daily.      . pantoprazole (PROTONIX) 40 MG tablet TAKE 1 TABLET EVERY MORNING  90 tablet  1  . sertraline (ZOLOFT) 50 MG tablet TAKE 1 TABLET BY MOUTH EVERY DAY  90 tablet  3  . spironolactone (ALDACTONE) 25 MG tablet TAKE 1 TABLET EVERY DAY  90 tablet  1   No current facility-administered medications on file prior to visit.    Review of Systems Review of Systems  Constitutional: Negative for fever, appetite change, fatigue and unexpected weight change.  Eyes: Negative for pain and visual disturbance.  Respiratory: Negative for cough and shortness of breath.   Cardiovascular: Negative for cp or palpitations    Gastrointestinal: Negative for nausea, diarrhea and constipation.  Genitourinary: Negative for urgency and frequency.  Skin: Negative for pallor or rash   MSK pos for mod to severe chronic pain and OA Neurological: Negative for weakness, light-headedness, numbness and headaches.  Hematological: Negative for adenopathy. Does not bruise/bleed easily.  Psychiatric/Behavioral: Negative for dysphoric mood. The patient is not nervous/anxious.         Objective:   Physical Exam  Constitutional: She appears well-developed and well-nourished. No distress.  obese and well appearing   HENT:  Head: Normocephalic and atraumatic.  Right Ear: External ear normal.  Left Ear: External ear normal.  Mouth/Throat: Oropharynx is clear and moist.  Eyes: Conjunctivae and EOM are normal. Pupils are equal, round, and reactive to light. No scleral icterus.  Neck: Normal range of motion. Neck supple. No JVD present. Carotid bruit is not present. No thyromegaly present.  Cardiovascular: Normal rate, regular rhythm, normal heart sounds and intact distal pulses.  Exam reveals no gallop.    Pulmonary/Chest: Effort normal and breath sounds normal. No respiratory distress. She has no wheezes. She exhibits no tenderness.  Diffusely distant bs   Abdominal: Soft. Bowel sounds are normal. She exhibits no distension, no abdominal bruit and no mass. There is no tenderness.  Genitourinary: No breast swelling, tenderness, discharge or bleeding.  Musculoskeletal: Normal range of motion. She exhibits tenderness. She exhibits no edema.  Poor rom spine and knees Generally poor mobility  Lymphadenopathy:    She has no cervical adenopathy.  Neurological:  She is alert. She has normal reflexes. No cranial nerve deficit. She exhibits normal muscle tone. Coordination normal.  Skin: Skin is warm and dry. No rash noted. No erythema. No pallor.  Psychiatric: She has a normal mood and affect.          Assessment & Plan:

## 2013-07-03 NOTE — Patient Instructions (Signed)
Keep working on weight loss  Follow up in 3 months so we can check in on progress with weight loss  Water exercise and chair exercise are good options for you  Leave a urine specimen on the way out I sent your px to the pharmacy

## 2013-07-03 NOTE — Progress Notes (Signed)
Pre-visit discussion using our clinic review tool. No additional management support is needed unless otherwise documented below in the visit note.  

## 2013-07-04 ENCOUNTER — Ambulatory Visit (INDEPENDENT_AMBULATORY_CARE_PROVIDER_SITE_OTHER): Payer: 59 | Admitting: Cardiology

## 2013-07-04 ENCOUNTER — Encounter: Payer: Self-pay | Admitting: Cardiology

## 2013-07-04 VITALS — BP 116/80 | HR 59 | Ht 67.0 in | Wt 233.0 lb

## 2013-07-04 DIAGNOSIS — E7800 Pure hypercholesterolemia, unspecified: Secondary | ICD-10-CM | POA: Diagnosis not present

## 2013-07-04 DIAGNOSIS — I10 Essential (primary) hypertension: Secondary | ICD-10-CM | POA: Diagnosis not present

## 2013-07-04 DIAGNOSIS — I498 Other specified cardiac arrhythmias: Secondary | ICD-10-CM

## 2013-07-04 NOTE — Patient Instructions (Signed)
Continue your current therapy  I will see you in 6 months.   

## 2013-07-04 NOTE — Assessment & Plan Note (Signed)
Stable on armour thyroid

## 2013-07-04 NOTE — Assessment & Plan Note (Signed)
Discussed how this problem influences overall health and the risks it imposes  Reviewed plan for weight loss with lower calorie diet (via better food choices and also portion control or program like weight watchers) and exercise building up to or more than 30 minutes 5 days per week including some aerobic activity   Is challenged due to chronic pain - I suggested chair or water exercise F/u 3 mo

## 2013-07-04 NOTE — Assessment & Plan Note (Signed)
On oxycodone chronically at this point  [facility] with cane Disc fall prec  Working on wt loss to lessen pain as well  Unable to exercise - suggested water/ chair exercise

## 2013-07-04 NOTE — Assessment & Plan Note (Signed)
ua clear- but pt needs to drink more water - this may help  Adv to let us know if she becomes interested in [facility] ref

## 2013-07-04 NOTE — Assessment & Plan Note (Signed)
BP: 126/82 mmHg   bp in fair control at this time  No changes needed  Disc lifstyle change with low sodium diet and exercise   Enc to keep working on wt loss

## 2013-07-04 NOTE — Assessment & Plan Note (Signed)
Disc goals for lipids and reasons to control them Rev labs with pt Rev low sat fat diet in detail  Statin med intolerant

## 2013-07-04 NOTE — Progress Notes (Signed)
[name] Date of Birth: 11-18-1935   History of Present Illness: [name] is seen today for followup today. She is doing very well from a cardiac standpoint. She denies any symptoms of chest pain, palpitations, or shortness of breath. She quit smoking in March 2013. She denies any significant palpitations.  Current Outpatient Prescriptions on File Prior to Visit  Medication Sig Dispense Refill  . meclizine (ANTIVERT) 25 MG tablet Take 1 tablet (25 mg total) by mouth 3 (three) times daily as needed for dizziness.  30 tablet  3  . metoprolol tartrate (LOPRESSOR) 25 MG tablet Take 1 tablet (25 mg total) by mouth 2 (two) times daily.  180 tablet  3  . Oxycodone HCl 20 MG TABS Take by mouth 2 (two) times daily.      . pantoprazole (PROTONIX) 40 MG tablet Take 1 tablet (40 mg total) by mouth daily.  90 tablet  3  . sertraline (ZOLOFT) 50 MG tablet TAKE 1 TABLET BY MOUTH EVERY DAY  90 tablet  3  . spironolactone (ALDACTONE) 25 MG tablet Take 1 tablet (25 mg total) by mouth daily.  90 tablet  3  . thyroid (ARMOUR THYROID) 60 MG tablet Take 1 tablet (60 mg total) by mouth daily before breakfast.  90 tablet  3   No current facility-administered medications on file prior to visit.    Allergies  Allergen Reactions  . Amoxicillin-Pot Clavulanate     REACTION: diarrhea  . Aspirin   . Atorvastatin     REACTION: feel weak  . Carisoprodol     REACTION: intolerant  . Codeine     REACTION: pt. reports allergy to codeine, reaction not known  . Codeine Phosphate     REACTION: UNKNOWN  . Cortisone     REACTION: pt. reports allergy, reaction not known  . Ezetimibe-Simvastatin     REACTION: feels weak  . Levothyroxine Sodium     REACTION: reaction not known  . Naproxen Sodium     REACTION: GI symptoms  . Omeprazole     REACTION: abd pain  . Quinapril Hcl     REACTION: reports allergy, reaction not known  . Rofecoxib     REACTION: reports allergy, reaction not known    Past Medical  History  Diagnosis Date  . Hypertension   . Hyperlipidemia   . PVC's (premature ventricular contractions)   . Tobacco dependence   . IBS (irritable bowel syndrome)   . Diverticulitis   . Palpitations   . Chronic pain     Managed by Dr. Vear Clock  . SVT (supraventricular tachycardia)     Past Surgical History  Procedure Laterality Date  . Tonsillectomy    . Nasal sinus surgery    . Wrist fracture surgery    . Replacement total knee      History  Smoking status  . Former Smoker -- 0.50 packs/day  . Quit date: 11/11/2011  Smokeless tobacco  . Never Used    Comment: Quit 11/22/11.    History  Alcohol Use No    Family History  Problem Relation Age of Onset  . Diabetes Mother   . Heart attack Father   . Heart disease Sister     Review of Systems: The review of systems is as above.   All other systems were reviewed and are negative.  Physical Exam: BP 116/80  Pulse 59  Ht 5\' 7"  (1.702 m)  Wt 233 lb (105.688 kg)  BMI 36.48 kg/m2 Patient  is in no acute distress.  She is obese. Skin is warm and dry. Color is normal.  HEENT is unremarkable. Normocephalic/atraumatic. PERRL. Sclera are nonicteric. Neck is supple. No masses. No JVD. Lungs are clear. Cardiac exam shows a regular rate and rhythm.  Abdomen is soft and obese. Extremities are without edema. She walks with a cane.  No gross neurologic deficits noted.  LABORATORY DATA:   Lab Results  Component Value Date   WBC 7.9 03/06/2013   HGB 13.2 03/06/2013   HCT 40.4 03/06/2013   PLT 248.0 03/06/2013   GLUCOSE 101* 03/06/2013   CHOL 184 03/06/2013   TRIG 138.0 03/06/2013   HDL 41.80 03/06/2013   LDLDIRECT 136.4 05/24/2012   LDLCALC 115* 03/06/2013   ALT 23 03/06/2013   AST 23 03/06/2013   NA 138 03/06/2013   K 4.7 03/06/2013   CL 105 03/06/2013   CREATININE 1.0 03/06/2013   BUN 11 03/06/2013   CO2 29 03/06/2013   TSH 0.78 03/06/2013   INR 1.00 12/10/2010   ECG today demonstrates sinus bradycardia with a rate of 59 beats per  minute and nonspecific ST abnormality.  Assessment / Plan: 1. Supraventricular tachycardia. Well controlled on her current dose of metoprolol. We will continue same dose and followup again in 6 months.  2. PVCs.  3. Hypertension, controlled.  4. History of tobacco abuse now quit.  5. Hypothyroidism

## 2013-07-09 ENCOUNTER — Telehealth: Payer: Self-pay

## 2013-07-09 NOTE — Telephone Encounter (Signed)
Sounds like a viral cold I recommend mucinex plain (or DM if cough)- for congestion/ nasal saline and tylenol for aches  F/u if not imp

## 2013-07-09 NOTE — Telephone Encounter (Signed)
Pt notified of Dr. Tower's recommendations and verbalized understanding  

## 2013-07-09 NOTE — Telephone Encounter (Signed)
Pt received flu shot on 07/03/2013; three days after got flu shot pt began with S/T, non prod cough, achy all over and stuffy nose,no fever, no head or chest congestion, no SOB or wheezing.pt has been using nasal wash.[facility] [address] Pt wants to know if can get med called in to pharmacy.Pt request cb.

## 2013-07-10 ENCOUNTER — Telehealth: Payer: Self-pay | Admitting: *Deleted

## 2013-07-10 NOTE — Telephone Encounter (Signed)
Please ask pt whether she would rather pay for the armour thyroid out of pocket or go to levothyroxine (generic synthroid)

## 2013-07-10 NOTE — Telephone Encounter (Signed)
They are 2 different products (the levothyroxine is T4 and the armour is T3) -- and armour thyroid is generally not px except in certain circumstances where pt feel better with it (not unusual for insurance companies to decline paying for it)  I think she will feel much better to continue the armour-given her past hx - but if she cannot continue to afford it I will px the levothyroxine (in that case we will need labs 6 wk later also)

## 2013-07-10 NOTE — Telephone Encounter (Signed)
Received notice from pharmacy that Armour Thyroid was not on pt's formulary and not covered by insurance. Several prior auth's have been attempted this year and were all denied.Per insurance pt needs use generic levothyroxine. Is this change appropriate?

## 2013-07-10 NOTE — Telephone Encounter (Signed)
Pt wanted your opinion Dr. Milinda Antis, she said she has had problems in the past with different Thyroid medications, but not the Armour Thyroid, but if you think that the levothyroxine is really close to her Armour Thyroid then she is willing to try it, pt has already paid for Armour Thyroid this month but is willing to try the levothyroxine next month if you think that's okay, please advise

## 2013-07-13 ENCOUNTER — Other Ambulatory Visit: Payer: 59

## 2013-07-13 NOTE — Telephone Encounter (Signed)
Thanks for letting me know -refill if she needs it please

## 2013-07-13 NOTE — Telephone Encounter (Signed)
Pt said as of right now she can afford her med so she doesn't want to change but if she has a problem if the future paying for it pt will call and request we change it  Left voicemail letting pharmacy know pt wants to keep paying out of pocket for the armour thyroid

## 2013-07-31 ENCOUNTER — Ambulatory Visit
Admission: RE | Admit: 2013-07-31 | Discharge: 2013-07-31 | Disposition: A | Discharge status: Home / Self Care | Payer: 59 | Source: Ambulatory Visit | Attending: Anesthesiology | Admitting: Anesthesiology

## 2013-07-31 DIAGNOSIS — Z79899 Other long term (current) drug therapy: Secondary | ICD-10-CM | POA: Diagnosis not present

## 2013-07-31 DIAGNOSIS — M47817 Spondylosis without myelopathy or radiculopathy, lumbosacral region: Secondary | ICD-10-CM | POA: Diagnosis not present

## 2013-07-31 DIAGNOSIS — N281 Cyst of kidney, acquired: Secondary | ICD-10-CM | POA: Diagnosis not present

## 2013-07-31 DIAGNOSIS — R109 Unspecified abdominal pain: Secondary | ICD-10-CM

## 2013-07-31 DIAGNOSIS — G894 Chronic pain syndrome: Secondary | ICD-10-CM | POA: Diagnosis not present

## 2013-07-31 DIAGNOSIS — K7689 Other specified diseases of liver: Secondary | ICD-10-CM | POA: Diagnosis not present

## 2013-08-31 DIAGNOSIS — M47817 Spondylosis without myelopathy or radiculopathy, lumbosacral region: Secondary | ICD-10-CM | POA: Diagnosis not present

## 2013-08-31 DIAGNOSIS — Z79899 Other long term (current) drug therapy: Secondary | ICD-10-CM | POA: Diagnosis not present

## 2013-08-31 DIAGNOSIS — G894 Chronic pain syndrome: Secondary | ICD-10-CM | POA: Diagnosis not present

## 2013-10-02 ENCOUNTER — Encounter: Payer: Self-pay | Admitting: Family Medicine

## 2013-10-02 ENCOUNTER — Ambulatory Visit (INDEPENDENT_AMBULATORY_CARE_PROVIDER_SITE_OTHER): Payer: 59 | Admitting: Family Medicine

## 2013-10-02 VITALS — BP 126/82 | HR 55 | Temp 97.6°F | Ht 67.0 in | Wt 231.0 lb

## 2013-10-02 DIAGNOSIS — I10 Essential (primary) hypertension: Secondary | ICD-10-CM

## 2013-10-02 DIAGNOSIS — Z79899 Other long term (current) drug therapy: Secondary | ICD-10-CM | POA: Diagnosis not present

## 2013-10-02 DIAGNOSIS — G894 Chronic pain syndrome: Secondary | ICD-10-CM | POA: Diagnosis not present

## 2013-10-02 DIAGNOSIS — E669 Obesity, unspecified: Secondary | ICD-10-CM | POA: Diagnosis not present

## 2013-10-02 DIAGNOSIS — M461 Sacroiliitis, not elsewhere classified: Secondary | ICD-10-CM | POA: Diagnosis not present

## 2013-10-02 DIAGNOSIS — M47817 Spondylosis without myelopathy or radiculopathy, lumbosacral region: Secondary | ICD-10-CM | POA: Diagnosis not present

## 2013-10-02 NOTE — Progress Notes (Signed)
Pre-visit discussion using our clinic review tool. No additional management support is needed unless otherwise documented below in the visit note.  

## 2013-10-02 NOTE — Patient Instructions (Signed)
Blood pressure control is good  Your ultrasound showed some fat in your liver- from extra weight  Keep working on weight loss  Cut portions by another third and avoid junk food and sugar  Get on line on a computer and do a search for "chair exercise" -there are different types of programs - optimally I want you to exercise 5 days per week Water exercise would also be helpful

## 2013-10-02 NOTE — Progress Notes (Signed)
Subjective:    Patient ID: [name], female    DOB: 1936-08-23, 78 y.o.   MRN: [id number]  HPI Here for f/u of chronic medical problems   Had an abd ultrasound - tiny renal cyst and also hepatic steatosis Ordered by her pain med   Found out that her insurance charges more for armor thyroid - but she does better on that so she wants to stay with it   bp is stable today  No cp or palpitations or headaches or edema  No side effects to medicines  BP Readings from Last 3 Encounters:  10/02/13 126/82  07/04/13 116/80  07/03/13 126/82     Wt is down 2-3 lb with bmi of 36   Wanted to see if she could have progress with wt loss  Is eating less sweets - than she used to / wants to cut it out  Cut portions also -but still not loosing   Has not investigated any chair exercise  Still severe pain in R back and    Patient Active Problem List   Diagnosis Date Noted  . Urine frequency 07/03/2013  . UTI (urinary tract infection) 03/06/2013  . Abdominal  pain, other specified site 03/06/2013  . Former smoker 12/12/2012  . Muscle cramps 07/12/2012  . Left foot pain 04/19/2011  . Depression 04/19/2011  . Pre-syncope 03/08/2011  . LEG PAIN, LEFT 08/28/2010  . OBESITY 10/03/2007  . VERTIGO 04/11/2007  . HYPOTHYROIDISM 12/27/2006  . HYPERCHOLESTEROLEMIA 12/27/2006  . HYPERTENSION 12/27/2006  . SUPRAVENTRICULAR TACHYCARDIA 12/27/2006  . ALLERGIC RHINITIS 12/27/2006  . COPD 12/27/2006  . OSTEOARTHRITIS 12/27/2006  . BACK PAIN, CHRONIC 12/27/2006   Past Medical History  Diagnosis Date  . Hypertension   . Hyperlipidemia   . PVC's (premature ventricular contractions)   . Tobacco dependence   . IBS (irritable bowel syndrome)   . Diverticulitis   . Palpitations   . Chronic pain     Managed by Dr. Hardin Negus  . SVT (supraventricular tachycardia)    Past Surgical History  Procedure Laterality Date  . Tonsillectomy    . Nasal sinus surgery    . Wrist fracture surgery    .  Replacement total knee     History  Substance Use Topics  . Smoking status: Former Smoker -- 0.50 packs/day    Quit date: 11/11/2011  . Smokeless tobacco: Never Used     Comment: Quit 11/22/11.  [name] Alcohol Use: No   Family History  Problem Relation Age of Onset  . Diabetes Mother   . Heart attack Father   . Heart disease Sister    Allergies  Allergen Reactions  . Amoxicillin-Pot Clavulanate     REACTION: diarrhea  . Aspirin Nausea And Vomiting  . Atorvastatin     REACTION: feel weak  . Carisoprodol     REACTION: intolerant  . Codeine     REACTION: pt. reports allergy to codeine, reaction not known  . Codeine Phosphate     REACTION: UNKNOWN  . Cortisone     REACTION: pt. reports allergy, reaction not known  . Ezetimibe-Simvastatin     REACTION: feels weak  . Levothyroxine Sodium     REACTION: reaction not known  . Naproxen Sodium     REACTION: GI symptoms  . Omeprazole     REACTION: abd pain  . Quinapril Hcl     REACTION: reports allergy, reaction not known  . Rofecoxib     REACTION: reports allergy, reaction not  known   Current Outpatient Prescriptions on File Prior to Visit  Medication Sig Dispense Refill  . meclizine (ANTIVERT) 25 MG tablet Take 1 tablet (25 mg total) by mouth 3 (three) times daily as needed for dizziness.  30 tablet  3  . metoprolol tartrate (LOPRESSOR) 25 MG tablet Take 1 tablet (25 mg total) by mouth 2 (two) times daily.  180 tablet  3  . Oxycodone HCl 20 MG TABS Take by mouth 2 (two) times daily.      . pantoprazole (PROTONIX) 40 MG tablet Take 1 tablet (40 mg total) by mouth daily.  90 tablet  3  . sertraline (ZOLOFT) 50 MG tablet TAKE 1 TABLET BY MOUTH EVERY DAY  90 tablet  3  . spironolactone (ALDACTONE) 25 MG tablet Take 1 tablet (25 mg total) by mouth daily.  90 tablet  3  . thyroid (ARMOUR THYROID) 60 MG tablet Take 1 tablet (60 mg total) by mouth daily before breakfast.  90 tablet  3   No current facility-administered medications on  file prior to visit.      Review of Systems Review of Systems  Constitutional: Negative for fever, appetite change, fatigue and unexpected weight change.  Eyes: Negative for pain and visual disturbance.  Respiratory: Negative for cough and shortness of breath.   Cardiovascular: Negative for cp or palpitations    Gastrointestinal: Negative for nausea, diarrhea and constipation.  Genitourinary: Negative for urgency and frequency.  Skin: Negative for pallor or rash  MSK pos for chronic back pain   Neurological: Negative for weakness, light-headedness, numbness and headaches.  Hematological: Negative for adenopathy. Does not bruise/bleed easily.  Psychiatric/Behavioral: Negative for dysphoric mood. The patient is not nervous/anxious.         Objective:   Physical Exam  Constitutional: She appears well-developed and well-nourished. No distress.  obese and well appearing   HENT:  Head: Normocephalic and atraumatic.  Mouth/Throat: Oropharynx is clear and moist.  Eyes: Conjunctivae and EOM are normal. Pupils are equal, round, and reactive to light. No scleral icterus.  Neck: Normal range of motion. Neck supple. No thyromegaly present.  Cardiovascular: Normal rate and regular rhythm.   Pulmonary/Chest: Breath sounds normal. No respiratory distress. She has no wheezes. She has no rales.  Diffusely distant bs   Musculoskeletal: She exhibits no edema.  Lymphadenopathy:    She has no cervical adenopathy.  Neurological: She is alert. She has normal reflexes. No cranial nerve deficit. She exhibits normal muscle tone. Coordination normal.  Skin: Skin is warm and dry. No rash noted. No erythema.  Psychiatric: She has a normal mood and affect.          Assessment & Plan:

## 2013-10-03 ENCOUNTER — Telehealth: Payer: Self-pay | Admitting: Family Medicine

## 2013-10-03 ENCOUNTER — Ambulatory Visit: Payer: 59 | Admitting: Family Medicine

## 2013-10-03 NOTE — Assessment & Plan Note (Signed)
BP: 126/82 mmHg  bp in fair control at this time  No changes needed Disc lifstyle change with low sodium diet and exercise

## 2013-10-03 NOTE — Assessment & Plan Note (Signed)
Discussed how this problem influences overall health and the risks it imposes  Reviewed plan for weight loss with lower calorie diet (via better food choices and also portion control or program like weight watchers) and exercise building up to or more than 30 minutes 5 days per week including some aerobic activity   she would need to consider chair or water exercise  She seems to have lost some motivation since her last visit

## 2013-10-03 NOTE — Telephone Encounter (Signed)
Relevant patient education mailed to patient.  

## 2013-10-30 DIAGNOSIS — Z79899 Other long term (current) drug therapy: Secondary | ICD-10-CM | POA: Diagnosis not present

## 2013-10-30 DIAGNOSIS — G894 Chronic pain syndrome: Secondary | ICD-10-CM | POA: Diagnosis not present

## 2013-10-30 DIAGNOSIS — M47817 Spondylosis without myelopathy or radiculopathy, lumbosacral region: Secondary | ICD-10-CM | POA: Diagnosis not present

## 2013-10-30 DIAGNOSIS — M1990 Unspecified osteoarthritis, unspecified site: Secondary | ICD-10-CM | POA: Diagnosis not present

## 2013-11-28 DIAGNOSIS — Z79899 Other long term (current) drug therapy: Secondary | ICD-10-CM | POA: Diagnosis not present

## 2013-11-28 DIAGNOSIS — G894 Chronic pain syndrome: Secondary | ICD-10-CM | POA: Diagnosis not present

## 2013-11-28 DIAGNOSIS — G5730 Lesion of lateral popliteal nerve, unspecified lower limb: Secondary | ICD-10-CM | POA: Diagnosis not present

## 2013-11-28 DIAGNOSIS — M47817 Spondylosis without myelopathy or radiculopathy, lumbosacral region: Secondary | ICD-10-CM | POA: Diagnosis not present

## 2013-12-26 DIAGNOSIS — Z79899 Other long term (current) drug therapy: Secondary | ICD-10-CM | POA: Diagnosis not present

## 2013-12-26 DIAGNOSIS — G894 Chronic pain syndrome: Secondary | ICD-10-CM | POA: Diagnosis not present

## 2013-12-26 DIAGNOSIS — M47817 Spondylosis without myelopathy or radiculopathy, lumbosacral region: Secondary | ICD-10-CM | POA: Diagnosis not present

## 2013-12-26 DIAGNOSIS — G5730 Lesion of lateral popliteal nerve, unspecified lower limb: Secondary | ICD-10-CM | POA: Diagnosis not present

## 2014-01-07 ENCOUNTER — Other Ambulatory Visit: Payer: Self-pay | Admitting: *Deleted

## 2014-01-08 MED ORDER — THYROID 60 MG PO TABS: 60.0000 mg | ORAL_TABLET | Freq: Every day | ORAL | Status: DC

## 2014-01-28 DIAGNOSIS — G894 Chronic pain syndrome: Secondary | ICD-10-CM | POA: Diagnosis not present

## 2014-01-28 DIAGNOSIS — Z79899 Other long term (current) drug therapy: Secondary | ICD-10-CM | POA: Diagnosis not present

## 2014-01-28 DIAGNOSIS — M47817 Spondylosis without myelopathy or radiculopathy, lumbosacral region: Secondary | ICD-10-CM | POA: Diagnosis not present

## 2014-01-28 DIAGNOSIS — G5730 Lesion of lateral popliteal nerve, unspecified lower limb: Secondary | ICD-10-CM | POA: Diagnosis not present

## 2014-02-04 ENCOUNTER — Ambulatory Visit: Payer: 59 | Admitting: Cardiology

## 2014-02-27 DIAGNOSIS — Z79899 Other long term (current) drug therapy: Secondary | ICD-10-CM | POA: Diagnosis not present

## 2014-02-27 DIAGNOSIS — M1990 Unspecified osteoarthritis, unspecified site: Secondary | ICD-10-CM | POA: Diagnosis not present

## 2014-02-27 DIAGNOSIS — G894 Chronic pain syndrome: Secondary | ICD-10-CM | POA: Diagnosis not present

## 2014-02-27 DIAGNOSIS — M47817 Spondylosis without myelopathy or radiculopathy, lumbosacral region: Secondary | ICD-10-CM | POA: Diagnosis not present

## 2014-03-19 ENCOUNTER — Ambulatory Visit (INDEPENDENT_AMBULATORY_CARE_PROVIDER_SITE_OTHER): Payer: 59 | Admitting: Cardiology

## 2014-03-19 ENCOUNTER — Encounter: Payer: Self-pay | Admitting: Cardiology

## 2014-03-19 VITALS — BP 142/76 | HR 60 | Ht 67.0 in | Wt 236.6 lb

## 2014-03-19 DIAGNOSIS — I10 Essential (primary) hypertension: Secondary | ICD-10-CM

## 2014-03-19 DIAGNOSIS — E7800 Pure hypercholesterolemia, unspecified: Secondary | ICD-10-CM | POA: Diagnosis not present

## 2014-03-19 DIAGNOSIS — I498 Other specified cardiac arrhythmias: Secondary | ICD-10-CM | POA: Diagnosis not present

## 2014-03-19 NOTE — Patient Instructions (Signed)
Continue your current therapy  I will see you in about 6 months   

## 2014-03-19 NOTE — Progress Notes (Signed)
[name] Date of Birth: 12/09/1935   History of Present Illness: [name] is seen today for followup today. She has a history of SVT and PVCs. She is doing very well from a cardiac standpoint. She denies any symptoms of chest pain, palpitations, or shortness of breath. She denies any significant palpitations.  Current Outpatient Prescriptions on File Prior to Visit  Medication Sig Dispense Refill  . meclizine (ANTIVERT) 25 MG tablet Take 1 tablet (25 mg total) by mouth 3 (three) times daily as needed for dizziness.  30 tablet  3  . metoprolol tartrate (LOPRESSOR) 25 MG tablet Take 1 tablet (25 mg total) by mouth 2 (two) times daily.  180 tablet  3  . Oxycodone HCl 20 MG TABS Take by mouth 2 (two) times daily.      . pantoprazole (PROTONIX) 40 MG tablet Take 1 tablet (40 mg total) by mouth daily.  90 tablet  3  . sertraline (ZOLOFT) 50 MG tablet TAKE 1 TABLET BY MOUTH EVERY DAY  90 tablet  3  . spironolactone (ALDACTONE) 25 MG tablet Take 1 tablet (25 mg total) by mouth daily.  90 tablet  3  . thyroid (ARMOUR THYROID) 60 MG tablet Take 1 tablet (60 mg total) by mouth daily before breakfast. Please make an appointment with Dr, with fasting labs prior, for more refills.  90 tablet  0   No current facility-administered medications on file prior to visit.    Allergies  Allergen Reactions  . Amoxicillin-Pot Clavulanate     REACTION: diarrhea  . Aspirin Nausea And Vomiting  . Atorvastatin     REACTION: feel weak  . Carisoprodol     REACTION: intolerant  . Codeine     REACTION: pt. reports allergy to codeine, reaction not known  . Codeine Phosphate     REACTION: UNKNOWN  . Cortisone     REACTION: pt. reports allergy, reaction not known  . Ezetimibe-Simvastatin     REACTION: feels weak  . Levothyroxine Sodium     REACTION: reaction not known  . Naproxen Sodium     REACTION: GI symptoms  . Omeprazole     REACTION: abd pain  . Quinapril Hcl     REACTION: reports allergy,  reaction not known  . Rofecoxib     REACTION: reports allergy, reaction not known    Past Medical History  Diagnosis Date  . Hypertension   . Hyperlipidemia   . PVC's (premature ventricular contractions)   . Tobacco dependence   . IBS (irritable bowel syndrome)   . Diverticulitis   . Palpitations   . Chronic pain     Managed by Dr. Hardin Negus  . SVT (supraventricular tachycardia)     Past Surgical History  Procedure Laterality Date  . Tonsillectomy    . Nasal sinus surgery    . Wrist fracture surgery    . Replacement total knee      History  Smoking status  . Former Smoker -- 0.50 packs/day  . Quit date: 12/12/2011  Smokeless tobacco  . Never Used    History  Alcohol Use No    Family History  Problem Relation Age of Onset  . Diabetes Mother   . Heart attack Father   . Heart disease Sister     Review of Systems: The review of systems is as above.   All other systems were reviewed and are negative.  Physical Exam: BP 142/76  Pulse 60  Ht 5\' 7"  (1.702 m)  Wt 236 lb 9.6 oz (107.321 kg)  BMI 37.05 kg/m2 Patient is in no acute distress.  She is obese. Skin is warm and dry. Color is normal.  HEENT is unremarkable. Normocephalic/atraumatic. PERRL. Sclera are nonicteric. Neck is supple. No masses. No JVD. Lungs are clear. Cardiac exam shows a regular rate and rhythm.  Abdomen is soft and obese. Extremities are without edema. She walks with a cane.  No gross neurologic deficits noted.  LABORATORY DATA:   Lab Results  Component Value Date   WBC 7.9 03/06/2013   HGB 13.2 03/06/2013   HCT 40.4 03/06/2013   PLT 248.0 03/06/2013   GLUCOSE 101* 03/06/2013   CHOL 184 03/06/2013   TRIG 138.0 03/06/2013   HDL 41.80 03/06/2013   LDLDIRECT 136.4 05/24/2012   LDLCALC 115* 03/06/2013   ALT 23 03/06/2013   AST 23 03/06/2013   NA 138 03/06/2013   K 4.7 03/06/2013   CL 105 03/06/2013   CREATININE 1.0 03/06/2013   BUN 11 03/06/2013   CO2 29 03/06/2013   TSH 0.78 03/06/2013   INR 1.00  12/10/2010     Assessment / Plan: 1. Supraventricular tachycardia. Well controlled on her current dose of metoprolol. We will continue same dose and followup again in 6 months.  2. PVCs.  3. Hypertension, controlled.  4. History of tobacco abuse now quit.  5. Hypothyroidism

## 2014-04-15 ENCOUNTER — Telehealth: Payer: Self-pay | Admitting: Family Medicine

## 2014-04-15 DIAGNOSIS — E039 Hypothyroidism, unspecified: Secondary | ICD-10-CM

## 2014-04-15 NOTE — Telephone Encounter (Signed)
Message copied by Abner Greenspan on Mon Apr 15, 2014  9:43 PM ------      Message from: Ellamae Sia      Created: Tue Apr 09, 2014  4:07 PM      Regarding: Lab orders for Tuesday, 8.25.15       Lab orders, no f/u appt ------

## 2014-04-15 NOTE — Telephone Encounter (Signed)
I ordered thyroid labs -but it looks like she had them in July  Please ask her when she gets in if there were other labs we were supposed to check  I think she ended up staying on her armour thyroid after some debate about changing it

## 2014-04-17 ENCOUNTER — Encounter: Payer: Self-pay | Admitting: Family Medicine

## 2014-04-17 ENCOUNTER — Ambulatory Visit (INDEPENDENT_AMBULATORY_CARE_PROVIDER_SITE_OTHER): Payer: 59 | Admitting: Family Medicine

## 2014-04-17 ENCOUNTER — Other Ambulatory Visit: Payer: 59

## 2014-04-17 VITALS — BP 142/84 | HR 91 | Temp 98.5°F | Ht 67.0 in | Wt 230.0 lb

## 2014-04-17 DIAGNOSIS — R5381 Other malaise: Secondary | ICD-10-CM

## 2014-04-17 DIAGNOSIS — E039 Hypothyroidism, unspecified: Secondary | ICD-10-CM | POA: Diagnosis not present

## 2014-04-17 DIAGNOSIS — I10 Essential (primary) hypertension: Secondary | ICD-10-CM | POA: Diagnosis not present

## 2014-04-17 DIAGNOSIS — K5730 Diverticulosis of large intestine without perforation or abscess without bleeding: Secondary | ICD-10-CM

## 2014-04-17 DIAGNOSIS — E7800 Pure hypercholesterolemia, unspecified: Secondary | ICD-10-CM | POA: Diagnosis not present

## 2014-04-17 DIAGNOSIS — R5383 Other fatigue: Secondary | ICD-10-CM

## 2014-04-17 DIAGNOSIS — M79609 Pain in unspecified limb: Secondary | ICD-10-CM | POA: Diagnosis not present

## 2014-04-17 DIAGNOSIS — M79601 Pain in right arm: Secondary | ICD-10-CM | POA: Insufficient documentation

## 2014-04-17 DIAGNOSIS — M79602 Pain in left arm: Secondary | ICD-10-CM

## 2014-04-17 DIAGNOSIS — K5790 Diverticulosis of intestine, part unspecified, without perforation or abscess without bleeding: Secondary | ICD-10-CM

## 2014-04-17 DIAGNOSIS — R1032 Left lower quadrant pain: Secondary | ICD-10-CM | POA: Diagnosis not present

## 2014-04-17 NOTE — Patient Instructions (Addendum)
Labs today for thyroid and also for fatigue/ abdominal pain and arm pain and thyroid  Drink lots of fluids  I will let you decide what to do with your pain medicine  If your abdominal pain worsens or if you develop a fever-update me as soon as possbile

## 2014-04-17 NOTE — Assessment & Plan Note (Signed)
bp in fair control at this time -stable  BP Readings from Last 1 Encounters:  04/17/14 142/84   No changes needed Disc lifstyle change with low sodium diet and exercise   Lab today

## 2014-04-17 NOTE — Assessment & Plan Note (Addendum)
?   If diverticular or other Overall improved but still tender  Cbc today  Consider emp tx for diverticulitis Doubt ischemic bowel due to exam today

## 2014-04-17 NOTE — Progress Notes (Signed)
Pre visit review using our clinic review tool, if applicable. No additional management support is needed unless otherwise documented below in the visit note. 

## 2014-04-17 NOTE — Progress Notes (Signed)
Subjective:    Patient ID: [name], female    DOB: 1936/04/13, 78 y.o.   MRN: [id number]  HPI Here for chronic medical problems and also GI issues   Last weekend she woke up with stomach pain - mostly on the sides/ mid abdomen Her arms hurt also  Pain went away the next day- just felt poorly ever since  Is generally weak and tired and does not want to get out of bed  No n/v/d  No fever  Has had some sweats   No cp  Thought her heart was beating too slow  Not dizzy   Had a sore throat for a few days-that is gone now   Is taking oxy IR 5 mg for chronic pain from Dr Hardin Negus to replace the oxycodone 20 because it did not last long enough  Stopped the oxy IR 5 mg 2 days ago to see if that was causing her symptom - and went back to the 20  Overall does not think it made any difference   Still taking armour thyroid -no missed doses  No missed doses   Has not needed meclizine lately   Eating ok - cut back on that  Lost 6 lb - happy with that   bp is stable today  No cp or palpitations or headaches or edema  No side effects to medicines  BP Readings from Last 3 Encounters:  04/17/14 142/84  03/19/14 142/76  10/02/13 126/82     Due for labs for her thyroid   Also has arm tenderness - it hurts for the bp cuff to squeeze    Patient Active Problem List   Diagnosis Date Noted  . Other malaise and fatigue 04/17/2014  . Diverticulosis 04/17/2014  . Bilateral arm pain 04/17/2014  . Abdominal pain, LLQ 04/17/2014  . Former smoker 12/12/2012  . Muscle cramps 07/12/2012  . Depression 04/19/2011  . Pre-syncope 03/08/2011  . OBESITY 10/03/2007  . VERTIGO 04/11/2007  . HYPOTHYROIDISM 12/27/2006  . HYPERCHOLESTEROLEMIA 12/27/2006  . HYPERTENSION 12/27/2006  . SUPRAVENTRICULAR TACHYCARDIA 12/27/2006  . ALLERGIC RHINITIS 12/27/2006  . COPD 12/27/2006  . OSTEOARTHRITIS 12/27/2006  . BACK PAIN, CHRONIC 12/27/2006   Past Medical History  Diagnosis Date  .  Hypertension   . Hyperlipidemia   . PVC's (premature ventricular contractions)   . Tobacco dependence   . IBS (irritable bowel syndrome)   . Diverticulitis   . Palpitations   . Chronic pain     Managed by Dr. Hardin Negus  . SVT (supraventricular tachycardia)    Past Surgical History  Procedure Laterality Date  . Tonsillectomy    . Nasal sinus surgery    . Wrist fracture surgery    . Replacement total knee     History  Substance Use Topics  . Smoking status: Former Smoker -- 0.50 packs/day    Quit date: 12/12/2011  . Smokeless tobacco: Never Used  . Alcohol Use: No   Family History  Problem Relation Age of Onset  . Diabetes Mother   . Heart attack Father   . Heart disease Sister    Allergies  Allergen Reactions  . Amoxicillin-Pot Clavulanate     REACTION: diarrhea  . Aspirin Nausea And Vomiting  . Atorvastatin     REACTION: feel weak  . Carisoprodol     REACTION: intolerant  . Codeine     REACTION: pt. reports allergy to codeine, reaction not known  . Codeine Phosphate     REACTION: UNKNOWN  .  Cortisone     REACTION: pt. reports allergy, reaction not known  . Ezetimibe-Simvastatin     REACTION: feels weak  . Levothyroxine Sodium     REACTION: reaction not known  . Naproxen Sodium     REACTION: GI symptoms  . Omeprazole     REACTION: abd pain  . Quinapril Hcl     REACTION: reports allergy, reaction not known  . Rofecoxib     REACTION: reports allergy, reaction not known   Current Outpatient Prescriptions on File Prior to Visit  Medication Sig Dispense Refill  . meclizine (ANTIVERT) 25 MG tablet Take 1 tablet (25 mg total) by mouth 3 (three) times daily as needed for dizziness.  30 tablet  3  . metoprolol tartrate (LOPRESSOR) 25 MG tablet Take 1 tablet (25 mg total) by mouth 2 (two) times daily.  180 tablet  3  . oxycodone (OXY-IR) 5 MG capsule Take 5 mg by mouth every 4 (four) hours as needed.      . Oxycodone HCl 20 MG TABS Take by mouth 2 (two) times  daily.      . pantoprazole (PROTONIX) 40 MG tablet Take 1 tablet (40 mg total) by mouth daily.  90 tablet  3  . sertraline (ZOLOFT) 50 MG tablet TAKE 1 TABLET BY MOUTH EVERY DAY  90 tablet  3  . spironolactone (ALDACTONE) 25 MG tablet Take 1 tablet (25 mg total) by mouth daily.  90 tablet  3  . thyroid (ARMOUR THYROID) 60 MG tablet Take 1 tablet (60 mg total) by mouth daily before breakfast. Please make an appointment with Dr, with fasting labs prior, for more refills.  90 tablet  0   No current facility-administered medications on file prior to visit.    Review of Systems Review of Systems  Constitutional: Negative for fever, appetite change,  and unexpected weight change.pos for general fatigue and malaise   Eyes: Negative for pain and visual disturbance.  Respiratory: Negative for cough and shortness of breath.   Cardiovascular: Negative for cp or palpitations    Gastrointestinal: Negative for nausea, diarrhea and constipation. neg for blood in stool or dark stool Genitourinary: Negative for urgency and frequency. neg for dysuria  Skin: Negative for pallor or rash   Neurological: Negative for weakness, numbness and headaches.  Hematological: Negative for adenopathy. Does not bruise/bleed easily.  Psychiatric/Behavioral: Negative for dysphoric mood. The patient is  nervous/anxious.         Objective:   Physical Exam  Constitutional: She appears well-developed and well-nourished. No distress.  obese and well appearing   HENT:  Head: Normocephalic and atraumatic.  Mouth/Throat: Oropharynx is clear and moist.  Eyes: Conjunctivae and EOM are normal. Pupils are equal, round, and reactive to light. Right eye exhibits no discharge. Left eye exhibits no discharge. No scleral icterus.  Neck: Normal range of motion. Neck supple. No JVD present. Carotid bruit is not present. No thyromegaly present.  Cardiovascular: Normal rate, regular rhythm, normal heart sounds and intact distal pulses.   Exam reveals no gallop.   Pulmonary/Chest: Effort normal and breath sounds normal. No respiratory distress. She has no wheezes. She has no rales.  Abdominal: Soft. Bowel sounds are normal. She exhibits no distension and no mass. There is no hepatosplenomegaly. There is tenderness in the left upper quadrant and left lower quadrant. There is no rebound, no guarding and no CVA tenderness.  Musculoskeletal: She exhibits no edema and no tenderness.  Soft tissue in arms/ upper body are  diffusely tender  Lymphadenopathy:    She has no cervical adenopathy.  Neurological: She is alert. She has normal reflexes. No cranial nerve deficit. She exhibits normal muscle tone. Coordination normal.  Skin: Skin is warm and dry. No rash noted. No erythema. No pallor.  Psychiatric: She has a normal mood and affect.          Assessment & Plan:   Problem List Items Addressed This Visit     Cardiovascular and Mediastinum   HYPERTENSION - Primary      bp in fair control at this time -stable  BP Readings from Last 1 Encounters:  04/17/14 142/84   No changes needed Disc lifstyle change with low sodium diet and exercise   Lab today    Relevant Orders      CBC with Differential      Comprehensive metabolic panel      Lipid panel     Digestive   Diverticulosis     ? If poss diverticular inflammation or infection causing her abd pain  Cbc with diff today  She thinks last colonosc was about 10 years ago       Endocrine   HYPOTHYROIDISM     On armour thyroid-which she prefers tsh and free T3 today  Feeling tired and weak       Other   HYPERCHOLESTEROLEMIA     Lipid check today Pt has been watching diet and loosing wt  Rev low sat fat diet     Relevant Orders      Lipid panel   Other malaise and fatigue     Unsure etiol Has abd and arm soreness  Lab today and update     Relevant Orders      CBC with Differential      Vitamin B12   Bilateral arm pain     Now tenderness Check sed rate  for PMR      Relevant Orders      Sedimentation Rate   Abdominal pain, LLQ     ? If diverticular or other Overall improved but still tender  Cbc today  Consider emp tx for diverticulitis Doubt ischemic bowel due to exam today

## 2014-04-17 NOTE — Assessment & Plan Note (Signed)
Unsure etiol Has abd and arm soreness  Lab today and update

## 2014-04-17 NOTE — Assessment & Plan Note (Signed)
Now tenderness Check sed rate for PMR

## 2014-04-17 NOTE — Assessment & Plan Note (Signed)
Lipid check today Pt has been watching diet and loosing wt  Rev low sat fat diet

## 2014-04-17 NOTE — Assessment & Plan Note (Signed)
On armour thyroid-which she prefers tsh and free T3 today  Feeling tired and weak

## 2014-04-17 NOTE — Assessment & Plan Note (Signed)
?   If poss diverticular inflammation or infection causing her abd pain  Cbc with diff today  She thinks last colonosc was about 10 years ago

## 2014-04-18 ENCOUNTER — Telehealth: Payer: Self-pay | Admitting: *Deleted

## 2014-04-18 ENCOUNTER — Other Ambulatory Visit: Payer: 59

## 2014-04-18 LAB — COMPREHENSIVE METABOLIC PANEL
ALT: 16 U/L (ref 0–35)
AST: 26 U/L (ref 0–37)
Albumin: 3.6 g/dL (ref 3.5–5.2)
Alkaline Phosphatase: 65 U/L (ref 39–117)
BUN: 19 mg/dL (ref 6–23)
CALCIUM: 9.5 mg/dL (ref 8.4–10.5)
CO2: 26 meq/L (ref 19–32)
CREATININE: 1.1 mg/dL (ref 0.4–1.2)
Chloride: 103 mEq/L (ref 96–112)
GFR: 50.28 mL/min — ABNORMAL LOW (ref >60.00)
GLUCOSE: 103 mg/dL — AB (ref 70–99)
Potassium: 4.5 mEq/L (ref 3.5–5.1)
Sodium: 138 mEq/L (ref 135–145)
Total Bilirubin: 0.6 mg/dL (ref 0.2–1.2)
Total Protein: 8.2 g/dL (ref 6.0–8.3)

## 2014-04-18 LAB — CBC WITH DIFFERENTIAL/PLATELET
BASOS ABS: 0.1 10*3/uL (ref 0.0–0.1)
BASOS PCT: 0.9 % (ref 0.0–3.0)
Eosinophils Absolute: 0.1 10*3/uL (ref 0.0–0.7)
Eosinophils Relative: 1.2 % (ref 0.0–5.0)
HCT: 40.9 % (ref 36.0–46.0)
HEMOGLOBIN: 13.4 g/dL (ref 12.0–15.0)
Lymphocytes Relative: 29.7 % (ref 12.0–46.0)
Lymphs Abs: 2.6 10*3/uL (ref 0.7–4.0)
MCHC: 32.8 g/dL (ref 30.0–36.0)
MCV: 89.8 fl (ref 78.0–100.0)
MONOS PCT: 9.3 % (ref 3.0–12.0)
Monocytes Absolute: 0.8 10*3/uL (ref 0.1–1.0)
NEUTROS ABS: 5.1 10*3/uL (ref 1.4–7.7)
Neutrophils Relative %: 58.9 % (ref 43.0–77.0)
Platelets: 198 10*3/uL (ref 150.0–400.0)
RBC: 4.56 Mil/uL (ref 3.87–5.11)
RDW: 14.4 % (ref 11.5–15.5)
WBC: 8.6 10*3/uL (ref 4.0–10.5)

## 2014-04-18 LAB — LIPID PANEL
Cholesterol: 190 mg/dL (ref 0–200)
HDL: 37.6 mg/dL — AB (ref >39.00)
LDL Cholesterol: 128 mg/dL — ABNORMAL HIGH (ref 0–99)
NONHDL: 152.4
Total CHOL/HDL Ratio: 5
Triglycerides: 124 mg/dL (ref 0.0–149.0)
VLDL: 24.8 mg/dL (ref 0.0–40.0)

## 2014-04-18 LAB — VITAMIN B12: Vitamin B-12: 246 pg/mL (ref 211–911)

## 2014-04-18 LAB — SEDIMENTATION RATE: Sed Rate: 120 mm/hr — ABNORMAL HIGH (ref 0–22)

## 2014-04-18 NOTE — Telephone Encounter (Signed)
Good question but I doubt it is related  Still waiting on labs at this point

## 2014-04-18 NOTE — Telephone Encounter (Signed)
Pt notified of Dr. Tower's comments  

## 2014-04-18 NOTE — Telephone Encounter (Signed)
When pt was in the office yesterday she forgot to tell you that for over a year she has had a "leaky bladder" , but when her abd pain started she hasn't had that problem anymore and she wanted to know if you think that's related and should she be concerned

## 2014-04-30 ENCOUNTER — Encounter: Payer: Self-pay | Admitting: Family Medicine

## 2014-04-30 ENCOUNTER — Ambulatory Visit (INDEPENDENT_AMBULATORY_CARE_PROVIDER_SITE_OTHER): Payer: 59 | Admitting: Family Medicine

## 2014-04-30 ENCOUNTER — Ambulatory Visit: Payer: 59 | Admitting: Family Medicine

## 2014-04-30 VITALS — BP 124/78 | HR 61 | Temp 97.6°F | Ht 67.0 in | Wt 235.5 lb

## 2014-04-30 DIAGNOSIS — N3000 Acute cystitis without hematuria: Secondary | ICD-10-CM | POA: Diagnosis not present

## 2014-04-30 DIAGNOSIS — M25519 Pain in unspecified shoulder: Secondary | ICD-10-CM | POA: Diagnosis not present

## 2014-04-30 DIAGNOSIS — M461 Sacroiliitis, not elsewhere classified: Secondary | ICD-10-CM | POA: Diagnosis not present

## 2014-04-30 DIAGNOSIS — G5730 Lesion of lateral popliteal nerve, unspecified lower limb: Secondary | ICD-10-CM | POA: Diagnosis not present

## 2014-04-30 DIAGNOSIS — M47817 Spondylosis without myelopathy or radiculopathy, lumbosacral region: Secondary | ICD-10-CM | POA: Diagnosis not present

## 2014-04-30 DIAGNOSIS — R109 Unspecified abdominal pain: Secondary | ICD-10-CM | POA: Diagnosis not present

## 2014-04-30 DIAGNOSIS — R700 Elevated erythrocyte sedimentation rate: Secondary | ICD-10-CM | POA: Diagnosis not present

## 2014-04-30 LAB — POCT URINALYSIS DIPSTICK
Bilirubin, UA: NEGATIVE
Glucose, UA: NEGATIVE
KETONES UA: NEGATIVE
Nitrite, UA: NEGATIVE
PROTEIN UA: NEGATIVE
Spec Grav, UA: 1.025
Urobilinogen, UA: 0.2
pH, UA: 6

## 2014-04-30 MED ORDER — CIPROFLOXACIN HCL 250 MG PO TABS: 250.0000 mg | ORAL_TABLET | Freq: Two times a day (BID) | ORAL | Status: DC

## 2014-04-30 NOTE — Progress Notes (Signed)
Pre visit review using our clinic review tool, if applicable. No additional management support is needed unless otherwise documented below in the visit note. 

## 2014-04-30 NOTE — Progress Notes (Signed)
Subjective:    Patient ID: [name], female    DOB: 1936-08-05, 78 y.o.   MRN: [id number]  HPI Here for f/u of abd pain and inc sed rate   Since last visit her bladder has felt funny ua today tr leuk and mod blood Started having more incontinence also  No burning to urinate  No blood in urine No fever or n/v  Her LLQ abd pain from last visit - is improved   She had a sed rate of 120 Arms hurt on and off - better than they were  She did see Dr Hardin Negus today-and will holding the oxycodone 5 mg for a few days    Over the weekend -had some back pain also - low L side  That happened after sitting too long  Is improved now    Office Visit on 04/17/2014  Component Date Value Ref Range Status  . WBC 04/17/2014 8.6  4.0 - 10.5 K/uL Final  . RBC 04/17/2014 4.56  3.87 - 5.11 Mil/uL Final  . Hemoglobin 04/17/2014 13.4  12.0 - 15.0 g/dL Final  . HCT 04/17/2014 40.9  36.0 - 46.0 % Final  . MCV 04/17/2014 89.8  78.0 - 100.0 fl Final  . MCHC 04/17/2014 32.8  30.0 - 36.0 g/dL Final  . RDW 04/17/2014 14.4  11.5 - 15.5 % Final  . Platelets 04/17/2014 198.0  150.0 - 400.0 K/uL Final  . Neutrophils Relative % 04/17/2014 58.9  43.0 - 77.0 % Final  . Lymphocytes Relative 04/17/2014 29.7  12.0 - 46.0 % Final  . Monocytes Relative 04/17/2014 9.3  3.0 - 12.0 % Final  . Eosinophils Relative 04/17/2014 1.2  0.0 - 5.0 % Final  . Basophils Relative 04/17/2014 0.9  0.0 - 3.0 % Final  . Neutro Abs 04/17/2014 5.1  1.4 - 7.7 K/uL Final  . Lymphs Abs 04/17/2014 2.6  0.7 - 4.0 K/uL Final  . Monocytes Absolute 04/17/2014 0.8  0.1 - 1.0 K/uL Final  . Eosinophils Absolute 04/17/2014 0.1  0.0 - 0.7 K/uL Final  . Basophils Absolute 04/17/2014 0.1  0.0 - 0.1 K/uL Final  . Sodium 04/17/2014 138  135 - 145 mEq/L Final  . Potassium 04/17/2014 4.5  3.5 - 5.1 mEq/L Final  . Chloride 04/17/2014 103  96 - 112 mEq/L Final  . CO2 04/17/2014 26  19 - 32 mEq/L Final  . Glucose, Bld 04/17/2014 103* 70 - 99  mg/dL Final  . BUN 04/17/2014 19  6 - 23 mg/dL Final  . Creatinine, Ser 04/17/2014 1.1  0.4 - 1.2 mg/dL Final  . Total Bilirubin 04/17/2014 0.6  0.2 - 1.2 mg/dL Final  . Alkaline Phosphatase 04/17/2014 65  39 - 117 U/L Final  . AST 04/17/2014 26  0 - 37 U/L Final  . ALT 04/17/2014 16  0 - 35 U/L Final  . Total Protein 04/17/2014 8.2  6.0 - 8.3 g/dL Final  . Albumin 04/17/2014 3.6  3.5 - 5.2 g/dL Final  . Calcium 04/17/2014 9.5  8.4 - 10.5 mg/dL Final  . GFR 04/17/2014 50.28* >60.00 mL/min Final  . Cholesterol 04/17/2014 190  0 - 200 mg/dL Final   ATP III Classification       Desirable:  < 200 mg/dL               Borderline High:  200 - 239 mg/dL          High:  > = 240 mg/dL  . Triglycerides 04/17/2014  124.0  0.0 - 149.0 mg/dL Final   Normal:  <150 mg/dLBorderline High:  150 - 199 mg/dL  . HDL 04/17/2014 37.60* >39.00 mg/dL Final  . VLDL 04/17/2014 24.8  0.0 - 40.0 mg/dL Final  . LDL Cholesterol 04/17/2014 128* 0 - 99 mg/dL Final  . Total CHOL/HDL Ratio 04/17/2014 5   Final                  Men          Women1/2 Average Risk     3.4          3.3Average Risk          5.0          4.42X Average Risk          9.6          7.13X Average Risk          15.0          11.0                      . NonHDL 04/17/2014 152.40   Final   NOTE:  Non-HDL goal should be 30 mg/dL higher than patient's LDL goal (i.e. LDL goal of < 70 mg/dL, would have non-HDL goal of < 100 mg/dL)  . Vitamin B-12 04/17/2014 246  211 - 911 pg/mL Final  . Sed Rate 04/17/2014 120* 0 - 22 mm/hr Final      Review of Systems Review of Systems  Constitutional: Negative for fever, appetite change, fatigue and unexpected weight change.  Eyes: Negative for pain and visual disturbance.  Respiratory: Negative for cough and shortness of breath.   Cardiovascular: Negative for cp or palpitations    Gastrointestinal: Negative for nausea, diarrhea and constipation. pos for low abd/bladder pain  Genitourinary: pos for urgency and  frequency. neg for blood in urine  Skin: Negative for pallor or rash   Neurological: Negative for weakness, light-headedness, numbness and headaches.  Hematological: Negative for adenopathy. Does not bruise/bleed easily.  Psychiatric/Behavioral: Negative for dysphoric mood. The patient is not nervous/anxious.         Objective:   Physical Exam  Constitutional: She appears well-developed and well-nourished. No distress.  HENT:  Head: Normocephalic and atraumatic.  Mouth/Throat: Oropharynx is clear and moist.  Eyes: Conjunctivae and EOM are normal. Pupils are equal, round, and reactive to light. Right eye exhibits no discharge. Left eye exhibits no discharge. No scleral icterus.  Neck: Normal range of motion. Neck supple.  Cardiovascular: Normal rate and regular rhythm.   Pulmonary/Chest: Effort normal and breath sounds normal. No respiratory distress. She has no wheezes. She has no rales.  Diffusely distant bs   Abdominal: Soft. Bowel sounds are normal. She exhibits no distension and no mass. There is no rebound and no guarding.  Musculoskeletal: She exhibits tenderness.  Less myofasical tenderness in arms and shoulders today  Lymphadenopathy:    She has no cervical adenopathy.  Neurological: She is alert. She has normal reflexes. She exhibits normal muscle tone.  Skin: Skin is warm and dry. No rash noted.  Psychiatric: She has a normal mood and affect.          Assessment & Plan:   Problem List Items Addressed This Visit     Genitourinary   UTI (urinary tract infection) - Primary     Suspect this may be the cause of low abd/bladder discomfort and also inc sed rate tx with cipro  Enc water Culture urine  Update     Relevant Orders      Urine culture     Other   Elevated sed rate     May poss causes-did originally for arm pain-but this is improved If PMR-pt does not tolerate prednisone well -she is worried about this Will tx uti-this may be the cause- and re check  sed rate after tx  Pt also has widespread arthritis and deg disc dz as well      Other Visit Diagnoses   Abdominal pain, unspecified site        Relevant Orders       POCT urinalysis dipstick (Completed)

## 2014-04-30 NOTE — Patient Instructions (Addendum)
I think you may have a uti - we will treat this (urinary tract infection) We will also do a urine culture and call you when that result returns  Drink lots of water   Once we have a culture result and see how you feel - we will repeat the sed rate blood test

## 2014-04-30 NOTE — Assessment & Plan Note (Signed)
Suspect this may be the cause of low abd/bladder discomfort and also inc sed rate tx with cipro  Enc water Culture urine  Update

## 2014-04-30 NOTE — Assessment & Plan Note (Signed)
May poss causes-did originally for arm pain-but this is improved If PMR-pt does not tolerate prednisone well -she is worried about this Will tx uti-this may be the cause- and re check sed rate after tx  Pt also has widespread arthritis and deg disc dz as well

## 2014-05-01 ENCOUNTER — Ambulatory Visit: Payer: 59 | Admitting: Family Medicine

## 2014-05-01 LAB — URINE CULTURE: Colony Count: 30000

## 2014-05-15 ENCOUNTER — Encounter: Payer: Self-pay | Admitting: Family Medicine

## 2014-05-15 ENCOUNTER — Ambulatory Visit (INDEPENDENT_AMBULATORY_CARE_PROVIDER_SITE_OTHER): Payer: 59 | Admitting: Family Medicine

## 2014-05-15 VITALS — BP 130/70 | HR 67 | Temp 98.1°F | Ht 67.0 in | Wt 233.8 lb

## 2014-05-15 DIAGNOSIS — N3000 Acute cystitis without hematuria: Secondary | ICD-10-CM

## 2014-05-15 DIAGNOSIS — M353 Polymyalgia rheumatica: Secondary | ICD-10-CM | POA: Diagnosis not present

## 2014-05-15 DIAGNOSIS — Z23 Encounter for immunization: Secondary | ICD-10-CM | POA: Diagnosis not present

## 2014-05-15 LAB — POCT URINALYSIS DIPSTICK
BILIRUBIN UA: NEGATIVE
Glucose, UA: NEGATIVE
Ketones, UA: NEGATIVE
Leukocytes, UA: NEGATIVE
Nitrite, UA: NEGATIVE
SPEC GRAV UA: 1.015
UROBILINOGEN UA: 1
pH, UA: 8.5

## 2014-05-15 MED ORDER — PREDNISONE 20 MG PO TABS: 20.0000 mg | ORAL_TABLET | Freq: Every day | ORAL | Status: DC

## 2014-05-15 NOTE — Progress Notes (Signed)
Pre visit review using our clinic review tool, if applicable. No additional management support is needed unless otherwise documented below in the visit note. 

## 2014-05-15 NOTE — Progress Notes (Signed)
Subjective:    Patient ID: [name], female    DOB: 1935/12/02, 78 y.o.   MRN: [id number]  HPI Here for pain in both arms- worse on the R side  She was better last time she was here-then worse again for the past week   R handed No trauma Pain to use it to make up bed and do other chores Most of pain is in deltiod areas of both arms (she points to that area on both sides) Also pain when she wakes up - ? Stiffness Neck bothers her a bit   Re checking ua for last uti  ucx - was pos for multiple types of bacteria  Took abx and abd pain is better completely  Now she also has a "spot" of rectal bleeding  Not more than that  A spot on the toilet tissue- pink  No straining lately - has nl bms   Hx of diverticulosis -wondered if that could cause it  No nuts or popcorn since last visit    Patient Active Problem List   Diagnosis Date Noted  . Elevated sed rate 04/30/2014  . UTI (urinary tract infection) 04/30/2014  . Other malaise and fatigue 04/17/2014  . Diverticulosis 04/17/2014  . Bilateral arm pain 04/17/2014  . Abdominal pain, LLQ 04/17/2014  . Former smoker 12/12/2012  . Muscle cramps 07/12/2012  . Depression 04/19/2011  . Pre-syncope 03/08/2011  . OBESITY 10/03/2007  . VERTIGO 04/11/2007  . HYPOTHYROIDISM 12/27/2006  . HYPERCHOLESTEROLEMIA 12/27/2006  . HYPERTENSION 12/27/2006  . SUPRAVENTRICULAR TACHYCARDIA 12/27/2006  . ALLERGIC RHINITIS 12/27/2006  . COPD 12/27/2006  . OSTEOARTHRITIS 12/27/2006  . BACK PAIN, CHRONIC 12/27/2006   Past Medical History  Diagnosis Date  . Hypertension   . Hyperlipidemia   . PVC's (premature ventricular contractions)   . Tobacco dependence   . IBS (irritable bowel syndrome)   . Diverticulitis   . Palpitations   . Chronic pain     Managed by Dr. Hardin Negus  . SVT (supraventricular tachycardia)    Past Surgical History  Procedure Laterality Date  . Tonsillectomy    . Nasal sinus surgery    . Wrist fracture surgery     . Replacement total knee     History  Substance Use Topics  . Smoking status: Former Smoker -- 0.50 packs/day    Quit date: 12/12/2011  . Smokeless tobacco: Never Used  . Alcohol Use: No   Family History  Problem Relation Age of Onset  . Diabetes Mother   . Heart attack Father   . Heart disease Sister    Allergies  Allergen Reactions  . Amoxicillin-Pot Clavulanate     REACTION: diarrhea  . Aspirin Nausea And Vomiting  . Atorvastatin     REACTION: feel weak  . Carisoprodol     REACTION: intolerant  . Codeine     REACTION: pt. reports allergy to codeine, reaction not known  . Codeine Phosphate     REACTION: UNKNOWN  . Cortisone     REACTION: pt. reports allergy, reaction not known  . Ezetimibe-Simvastatin     REACTION: feels weak  . Levothyroxine Sodium     REACTION: reaction not known  . Naproxen Sodium     REACTION: GI symptoms  . Omeprazole     REACTION: abd pain  . Quinapril Hcl     REACTION: reports allergy, reaction not known  . Rofecoxib     REACTION: reports allergy, reaction not known   Current Outpatient Prescriptions  on File Prior to Visit  Medication Sig Dispense Refill  . ciprofloxacin (CIPRO) 250 MG tablet Take 1 tablet (250 mg total) by mouth 2 (two) times daily.  10 tablet  0  . meclizine (ANTIVERT) 25 MG tablet Take 1 tablet (25 mg total) by mouth 3 (three) times daily as needed for dizziness.  30 tablet  3  . metoprolol tartrate (LOPRESSOR) 25 MG tablet Take 1 tablet (25 mg total) by mouth 2 (two) times daily.  180 tablet  3  . oxycodone (OXY-IR) 5 MG capsule Take 5 mg by mouth every 4 (four) hours as needed.      . Oxycodone HCl 20 MG TABS Take by mouth 2 (two) times daily.      . pantoprazole (PROTONIX) 40 MG tablet Take 1 tablet (40 mg total) by mouth daily.  90 tablet  3  . sertraline (ZOLOFT) 50 MG tablet TAKE 1 TABLET BY MOUTH EVERY DAY  90 tablet  3  . spironolactone (ALDACTONE) 25 MG tablet Take 1 tablet (25 mg total) by mouth daily.  90  tablet  3  . thyroid (ARMOUR THYROID) 60 MG tablet Take 1 tablet (60 mg total) by mouth daily before breakfast. Please make an appointment with Dr, with fasting labs prior, for more refills.  90 tablet  0   No current facility-administered medications on file prior to visit.    Review of Systems Review of Systems  Constitutional: Negative for fever, appetite change,  and unexpected weight change.  Eyes: Negative for pain and visual disturbance.  Respiratory: Negative for cough and shortness of breath.   Cardiovascular: Negative for cp or palpitations    Gastrointestinal: Negative for nausea, diarrhea and constipation. neg for abd pain  Genitourinary: Negative for urgency and frequency. neg for dysuria  Skin: Negative for pallor or rash   MSK pos for bilat arm/ shoulder pain  Neurological: Negative for weakness, light-headedness, numbness and headaches.  Hematological: Negative for adenopathy. Does not bruise/bleed easily.  Psychiatric/Behavioral: Negative for dysphoric mood. The patient is not nervous/anxious.         Objective:   Physical Exam  Constitutional: She appears well-developed and well-nourished. No distress.  obese and well appearing   HENT:  Head: Normocephalic and atraumatic.  Mouth/Throat: Oropharynx is clear and moist.  Eyes: Conjunctivae and EOM are normal. Pupils are equal, round, and reactive to light. No scleral icterus.  Neck: Normal range of motion. Neck supple.  Cardiovascular: Normal rate, regular rhythm, normal heart sounds and intact distal pulses.   Pulmonary/Chest: Effort normal and breath sounds normal. No respiratory distress. She has no wheezes. She has no rales.  Diffusely distant bs   Musculoskeletal: She exhibits tenderness. She exhibits no edema.  Tender neck and shoulder girdle  Tender deltoid area of bilat arms  No joint swelling     Neurological: She is alert.  Skin: Skin is warm and dry. No rash noted. No erythema. No pallor.    Psychiatric: She has a normal mood and affect.          Assessment & Plan:   Problem List Items Addressed This Visit     Genitourinary   UTI (urinary tract infection) - Primary     Symptoms are resolved after abx  ua still dips for blood -will send for another cx and update  Will disc further at f/u   Also ? Of rectal blood on toilet tissue once since last visit-we will re visit this at f/u as well  Relevant Orders      Urine culture      POCT urinalysis dipstick (Completed)     Other   PMR (polymyalgia rheumatica)     bilat upper arm/shoulder girdle pain with high ESR Suspect PMR  Disc what it is  tx with 20 mg of prednisone - disc poss side eff - she thinks it may have given her a HA In the past-will watch this F/u 2 wk     Other Visit Diagnoses   Need for prophylactic vaccination and inoculation against influenza        Relevant Orders       Flu Vaccine QUAD 36+ mos PF IM (Fluarix Quad PF) (Completed)

## 2014-05-15 NOTE — Assessment & Plan Note (Signed)
Symptoms are resolved after abx  ua still dips for blood -will send for another cx and update  Will disc further at f/u   Also ? Of rectal blood on toilet tissue once since last visit-we will re visit this at f/u as well

## 2014-05-15 NOTE — Assessment & Plan Note (Signed)
bilat upper arm/shoulder girdle pain with high ESR Suspect PMR  Disc what it is  tx with 20 mg of prednisone - disc poss side eff - she thinks it may have given her a HA In the past-will watch this F/u 2 wk

## 2014-05-15 NOTE — Patient Instructions (Signed)
You may have a condition called PMR (polymyalgia rheumatica)  Try the prednisone 20 mg daily  It will make you hyper and hungry -hopefully at this small dose you will not have a problem- but keep me posted  Follow up with me in 2 weeks  Drink lots of water and eat fiber to keep stools soft so you don't strain  We will culture urine again

## 2014-05-17 LAB — URINE CULTURE: Colony Count: 30000

## 2014-05-21 ENCOUNTER — Telehealth: Payer: Self-pay | Admitting: Family Medicine

## 2014-05-21 DIAGNOSIS — R3129 Other microscopic hematuria: Secondary | ICD-10-CM

## 2014-05-21 NOTE — Telephone Encounter (Signed)
Message copied by Abner Greenspan on Tue May 21, 2014  1:35 PM ------      Message from: Tammi Sou      Created: Tue May 21, 2014 12:57 PM       Pt notified of urine cx results and Dr. Marliss Coots comments. Pt agrees to see urology, please put in referral, I advise pt Marion/Linda will call to get appt scheduled ------

## 2014-05-29 ENCOUNTER — Ambulatory Visit (INDEPENDENT_AMBULATORY_CARE_PROVIDER_SITE_OTHER): Payer: 59 | Admitting: Family Medicine

## 2014-05-29 ENCOUNTER — Encounter: Payer: Self-pay | Admitting: Family Medicine

## 2014-05-29 VITALS — BP 134/72 | HR 71 | Temp 98.5°F | Ht 67.0 in | Wt 238.2 lb

## 2014-05-29 DIAGNOSIS — M353 Polymyalgia rheumatica: Secondary | ICD-10-CM | POA: Diagnosis not present

## 2014-05-29 NOTE — Progress Notes (Signed)
Pre visit review using our clinic review tool, if applicable. No additional management support is needed unless otherwise documented below in the visit note. 

## 2014-05-29 NOTE — Patient Instructions (Signed)
I'm glad your pain is improved  Stay on the prednisone 20 mg for now - and we will check a labs today for your sed rate  Based on that result we may be able to start cutting down the prednisone dose   Try to choose healthy foods  Stay active

## 2014-05-29 NOTE — Assessment & Plan Note (Signed)
Much improved upper body and shoulder girdle pain on prednisone 20  Some side effects- sleeplessness and hunger ESR today - if improved will cut dose to 10 mg and go from there Stressed imp of staying active

## 2014-05-29 NOTE — Progress Notes (Signed)
Subjective:    Patient ID: [name], female    DOB: 1935-08-27, 78 y.o.   MRN: [id number]  HPI Here for f/u of PMR  Not hurting at all like she was  No more arm pain and shoulder pain - esp the R arm  Not a change in knees or back   bp is stable   Is feeling better overall   She is having a hard time sleeping  Also hungry  Allergies are not much better   Patient Active Problem List   Diagnosis Date Noted  . Microscopic hematuria 05/21/2014  . PMR (polymyalgia rheumatica) 05/15/2014  . Elevated sed rate 04/30/2014  . UTI (urinary tract infection) 04/30/2014  . Other malaise and fatigue 04/17/2014  . Diverticulosis 04/17/2014  . Bilateral arm pain 04/17/2014  . Abdominal pain, LLQ 04/17/2014  . Former smoker 12/12/2012  . Muscle cramps 07/12/2012  . Depression 04/19/2011  . Pre-syncope 03/08/2011  . OBESITY 10/03/2007  . VERTIGO 04/11/2007  . HYPOTHYROIDISM 12/27/2006  . HYPERCHOLESTEROLEMIA 12/27/2006  . HYPERTENSION 12/27/2006  . SUPRAVENTRICULAR TACHYCARDIA 12/27/2006  . ALLERGIC RHINITIS 12/27/2006  . COPD 12/27/2006  . OSTEOARTHRITIS 12/27/2006  . BACK PAIN, CHRONIC 12/27/2006   Past Medical History  Diagnosis Date  . Hypertension   . Hyperlipidemia   . PVC's (premature ventricular contractions)   . Tobacco dependence   . IBS (irritable bowel syndrome)   . Diverticulitis   . Palpitations   . Chronic pain     Managed by Dr. Hardin Negus  . SVT (supraventricular tachycardia)    Past Surgical History  Procedure Laterality Date  . Tonsillectomy    . Nasal sinus surgery    . Wrist fracture surgery    . Replacement total knee     History  Substance Use Topics  . Smoking status: Former Smoker -- 0.50 packs/day    Quit date: 12/12/2011  . Smokeless tobacco: Never Used  . Alcohol Use: No   Family History  Problem Relation Age of Onset  . Diabetes Mother   . Heart attack Father   . Heart disease Sister    Allergies  Allergen Reactions  .  Amoxicillin-Pot Clavulanate     REACTION: diarrhea  . Aspirin Nausea And Vomiting  . Atorvastatin     REACTION: feel weak  . Carisoprodol     REACTION: intolerant  . Codeine     REACTION: pt. reports allergy to codeine, reaction not known  . Codeine Phosphate     REACTION: UNKNOWN  . Cortisone     REACTION: pt. reports allergy, reaction not known  . Ezetimibe-Simvastatin     REACTION: feels weak  . Levothyroxine Sodium     REACTION: reaction not known  . Naproxen Sodium     REACTION: GI symptoms  . Omeprazole     REACTION: abd pain  . Quinapril Hcl     REACTION: reports allergy, reaction not known  . Rofecoxib     REACTION: reports allergy, reaction not known   Current Outpatient Prescriptions on File Prior to Visit  Medication Sig Dispense Refill  . ciprofloxacin (CIPRO) 250 MG tablet Take 1 tablet (250 mg total) by mouth 2 (two) times daily.  10 tablet  0  . meclizine (ANTIVERT) 25 MG tablet Take 1 tablet (25 mg total) by mouth 3 (three) times daily as needed for dizziness.  30 tablet  3  . metoprolol tartrate (LOPRESSOR) 25 MG tablet Take 1 tablet (25 mg total) by mouth 2 (two)  times daily.  180 tablet  3  . oxycodone (OXY-IR) 5 MG capsule Take 5 mg by mouth every 4 (four) hours as needed.      . Oxycodone HCl 20 MG TABS Take by mouth 2 (two) times daily.      . pantoprazole (PROTONIX) 40 MG tablet Take 1 tablet (40 mg total) by mouth daily.  90 tablet  3  . predniSONE (DELTASONE) 20 MG tablet Take 1 tablet (20 mg total) by mouth daily with breakfast.  30 tablet  1  . sertraline (ZOLOFT) 50 MG tablet TAKE 1 TABLET BY MOUTH EVERY DAY  90 tablet  3  . spironolactone (ALDACTONE) 25 MG tablet Take 1 tablet (25 mg total) by mouth daily.  90 tablet  3  . thyroid (ARMOUR THYROID) 60 MG tablet Take 1 tablet (60 mg total) by mouth daily before breakfast. Please make an appointment with Dr, with fasting labs prior, for more refills.  90 tablet  0   No current facility-administered  medications on file prior to visit.       Review of Systems Review of Systems  Constitutional: Negative for fever, appetite change, fatigue and unexpected weight change.  Eyes: Negative for pain and visual disturbance.  Respiratory: Negative for cough and shortness of breath.   Cardiovascular: Negative for cp or palpitations    Gastrointestinal: Negative for nausea, diarrhea and constipation.  Genitourinary: Negative for urgency and frequency.  Skin: Negative for pallor or rash   MSK pos for back and knee pain from OA , neg for arm pain  Neurological: Negative for weakness, light-headedness, numbness and headaches.  Hematological: Negative for adenopathy. Does not bruise/bleed easily.  Psychiatric/Behavioral: Negative for dysphoric mood. The patient is not nervous/anxious.         Objective:   Physical Exam  Constitutional: She appears well-developed and well-nourished. No distress.  obese and well appearing   HENT:  Head: Normocephalic and atraumatic.  Eyes: Conjunctivae and EOM are normal. Pupils are equal, round, and reactive to light. No scleral icterus.  Neck: Normal range of motion. Neck supple.  Cardiovascular: Normal rate and regular rhythm.   Pulmonary/Chest: Effort normal and breath sounds normal. No respiratory distress. She has no wheezes. She has no rales.  Diffusely distant bs   Musculoskeletal: She exhibits no edema.  Improved arm/ hand and shoulder girdle tenderness   Lymphadenopathy:    She has no cervical adenopathy.  Neurological: She is alert. She has normal reflexes. No cranial nerve deficit. She exhibits normal muscle tone. Coordination normal.  Skin: Skin is warm and dry. No rash noted. No erythema. No pallor.  Psychiatric: She has a normal mood and affect.          Assessment & Plan:   Problem List Items Addressed This Visit     Other   PMR (polymyalgia rheumatica) - Primary     Much improved upper body and shoulder girdle pain on prednisone  20  Some side effects- sleeplessness and hunger ESR today - if improved will cut dose to 10 mg and go from there Stressed imp of staying active     Relevant Orders      Sedimentation Rate (Completed)

## 2014-05-30 DIAGNOSIS — S8412XS Injury of peroneal nerve at lower leg level, left leg, sequela: Secondary | ICD-10-CM | POA: Diagnosis not present

## 2014-05-30 DIAGNOSIS — Z79891 Long term (current) use of opiate analgesic: Secondary | ICD-10-CM | POA: Diagnosis not present

## 2014-05-30 DIAGNOSIS — M4726 Other spondylosis with radiculopathy, lumbar region: Secondary | ICD-10-CM | POA: Diagnosis not present

## 2014-05-30 DIAGNOSIS — G894 Chronic pain syndrome: Secondary | ICD-10-CM | POA: Diagnosis not present

## 2014-05-30 LAB — SEDIMENTATION RATE: SED RATE: 51 mm/h — AB (ref 0–22)

## 2014-07-01 ENCOUNTER — Ambulatory Visit (INDEPENDENT_AMBULATORY_CARE_PROVIDER_SITE_OTHER): Payer: 59 | Admitting: Family Medicine

## 2014-07-01 ENCOUNTER — Encounter: Payer: Self-pay | Admitting: Family Medicine

## 2014-07-01 VITALS — BP 132/86 | HR 64 | Temp 98.4°F | Ht 67.0 in | Wt 236.8 lb

## 2014-07-01 DIAGNOSIS — M4726 Other spondylosis with radiculopathy, lumbar region: Secondary | ICD-10-CM | POA: Diagnosis not present

## 2014-07-01 DIAGNOSIS — M353 Polymyalgia rheumatica: Secondary | ICD-10-CM | POA: Diagnosis not present

## 2014-07-01 DIAGNOSIS — M25511 Pain in right shoulder: Secondary | ICD-10-CM | POA: Diagnosis not present

## 2014-07-01 DIAGNOSIS — Z79891 Long term (current) use of opiate analgesic: Secondary | ICD-10-CM | POA: Diagnosis not present

## 2014-07-01 DIAGNOSIS — G894 Chronic pain syndrome: Secondary | ICD-10-CM | POA: Diagnosis not present

## 2014-07-01 LAB — SEDIMENTATION RATE: Sed Rate: 36 mm/hr — ABNORMAL HIGH (ref 0–22)

## 2014-07-01 NOTE — Patient Instructions (Signed)
Labs today  Then I will let you know if it is ok to stop the prednisone  Follow up with me in about 3 months

## 2014-07-01 NOTE — Progress Notes (Signed)
Pre visit review using our clinic review tool, if applicable. No additional management support is needed unless otherwise documented below in the visit note. 

## 2014-07-01 NOTE — Progress Notes (Signed)
Subjective:    Patient ID: [name], female    DOB: [birth year], 78 y.o.   MRN: [id number]  HPI Pt went down to 1/4 pill of 10 mg prednisone   Symptoms are still improved from what they are - still decently controlled   Needs sed rate today  Came down from 120 to 51 last time  Will re check today and if not back up   Also has a rotator cuff problem- it does not hurt as bad as it was   Wt is down 2 more pounds -happy she stopped gaining   Patient Active Problem List   Diagnosis Date Noted  . Microscopic hematuria 05/21/2014  . PMR (polymyalgia rheumatica) 05/15/2014  . Elevated sed rate 04/30/2014  . UTI (urinary tract infection) 04/30/2014  . Other malaise and fatigue 04/17/2014  . Diverticulosis 04/17/2014  . Bilateral arm pain 04/17/2014  . Abdominal pain, LLQ 04/17/2014  . Former smoker 12/12/2012  . Muscle cramps 07/12/2012  . Depression 04/19/2011  . Pre-syncope 03/08/2011  . OBESITY 10/03/2007  . VERTIGO 04/11/2007  . HYPOTHYROIDISM 12/27/2006  . HYPERCHOLESTEROLEMIA 12/27/2006  . HYPERTENSION 12/27/2006  . SUPRAVENTRICULAR TACHYCARDIA 12/27/2006  . ALLERGIC RHINITIS 12/27/2006  . COPD 12/27/2006  . OSTEOARTHRITIS 12/27/2006  . BACK PAIN, CHRONIC 12/27/2006   Past Medical History  Diagnosis Date  . Hypertension   . Hyperlipidemia   . PVC's (premature ventricular contractions)   . Tobacco dependence   . IBS (irritable bowel syndrome)   . Diverticulitis   . Palpitations   . Chronic pain     Managed by Dr. Hardin Negus  . SVT (supraventricular tachycardia)    Past Surgical History  Procedure Laterality Date  . Tonsillectomy    . Nasal sinus surgery    . Wrist fracture surgery    . Replacement total knee     History  Substance Use Topics  . Smoking status: Former Smoker -- 0.50 packs/day    Quit date: 12/12/2011  . Smokeless tobacco: Never Used  . Alcohol Use: No   Family History  Problem Relation Age of Onset  . Diabetes Mother   .  Heart attack Father   . Heart disease Sister    Allergies  Allergen Reactions  . Amoxicillin-Pot Clavulanate     REACTION: diarrhea  . Aspirin Nausea And Vomiting  . Atorvastatin     REACTION: feel weak  . Carisoprodol     REACTION: intolerant  . Codeine     REACTION: pt. reports allergy to codeine, reaction not known  . Codeine Phosphate     REACTION: UNKNOWN  . Cortisone     REACTION: pt. reports allergy, reaction not known  . Ezetimibe-Simvastatin     REACTION: feels weak  . Levothyroxine Sodium     REACTION: reaction not known  . Naproxen Sodium     REACTION: GI symptoms  . Omeprazole     REACTION: abd pain  . Quinapril Hcl     REACTION: reports allergy, reaction not known  . Rofecoxib     REACTION: reports allergy, reaction not known   Current Outpatient Prescriptions on File Prior to Visit  Medication Sig Dispense Refill  . meclizine (ANTIVERT) 25 MG tablet Take 1 tablet (25 mg total) by mouth 3 (three) times daily as needed for dizziness. 30 tablet 3  . metoprolol tartrate (LOPRESSOR) 25 MG tablet Take 1 tablet (25 mg total) by mouth 2 (two) times daily. 180 tablet 3  . oxycodone (OXY-IR) 5 MG  capsule Take 5 mg by mouth every 4 (four) hours as needed.    . Oxycodone HCl 20 MG TABS Take by mouth 2 (two) times daily.    . pantoprazole (PROTONIX) 40 MG tablet Take 1 tablet (40 mg total) by mouth daily. 90 tablet 3  . predniSONE (DELTASONE) 20 MG tablet Take 1 tablet (20 mg total) by mouth daily with breakfast. 30 tablet 1  . sertraline (ZOLOFT) 50 MG tablet TAKE 1 TABLET BY MOUTH EVERY DAY 90 tablet 3  . spironolactone (ALDACTONE) 25 MG tablet Take 1 tablet (25 mg total) by mouth daily. 90 tablet 3  . thyroid (ARMOUR THYROID) 60 MG tablet Take 1 tablet (60 mg total) by mouth daily before breakfast. Please make an appointment with Dr, with fasting labs prior, for more refills. 90 tablet 0   No current facility-administered medications on file prior to visit.       Review of Systems Review of Systems  Constitutional: Negative for fever, appetite change, and unexpected weight change.  Eyes: Negative for pain and visual disturbance.  Respiratory: Negative for cough and shortness of breath.   Cardiovascular: Negative for cp or palpitations    Gastrointestinal: Negative for nausea, diarrhea and constipation.  Genitourinary: Negative for urgency and frequency.  Skin: Negative for pallor or rash   MSK pos for baseline low back pain , neg for joint swelling  Neurological: Negative for weakness, light-headedness, numbness and headaches.  Hematological: Negative for adenopathy. Does not bruise/bleed easily.  Psychiatric/Behavioral: Negative for dysphoric mood. The patient is not nervous/anxious.         Objective:   Physical Exam  Constitutional: She appears well-developed and well-nourished.  obese and well appearing   HENT:  Head: Normocephalic and atraumatic.  Mouth/Throat: Oropharynx is clear and moist.  Eyes: Conjunctivae and EOM are normal. Pupils are equal, round, and reactive to light. No scleral icterus.  Neck: Normal range of motion. Neck supple.  Cardiovascular: Normal rate, regular rhythm and normal heart sounds.   Pulmonary/Chest: Effort normal and breath sounds normal. She has no rales.  Musculoskeletal: She exhibits no edema or tenderness.  No tenderness of neck/ shoulders/trapezius today Nl rom of UEs   Baseline reduced rom of LS   Lymphadenopathy:    She has no cervical adenopathy.  Neurological: She is alert. She has normal reflexes. No cranial nerve deficit. She exhibits normal muscle tone. Coordination normal.  Skin: Skin is warm and dry. No rash noted. No erythema.  Psychiatric: She has a normal mood and affect.          Assessment & Plan:   Problem List Items Addressed This Visit      Other   PMR (polymyalgia rheumatica) - Primary    Overall still much better with 1/4 of a 10 mg prednisone pill  (sounds like  this also helped her rotator cuff problem)  ESR today - if not rebounding will plan to stop the prednisone which pt is eager to do  Again disc PMR/ what it is and why we treated it  Pend labs   Follow up 3 months     Relevant Orders      Sedimentation Rate (Completed)

## 2014-07-01 NOTE — Assessment & Plan Note (Signed)
Overall still much better with 1/4 of a 10 mg prednisone pill  (sounds like this also helped her rotator cuff problem)  ESR today - if not rebounding will plan to stop the prednisone which pt is eager to do  Again disc PMR/ what it is and why we treated it  Pend labs   Follow up 3 months

## 2014-07-09 DIAGNOSIS — N393 Stress incontinence (female) (male): Secondary | ICD-10-CM | POA: Diagnosis not present

## 2014-07-09 DIAGNOSIS — R312 Other microscopic hematuria: Secondary | ICD-10-CM | POA: Diagnosis not present

## 2014-07-09 DIAGNOSIS — N3941 Urge incontinence: Secondary | ICD-10-CM | POA: Diagnosis not present

## 2014-07-20 ENCOUNTER — Other Ambulatory Visit: Payer: Self-pay | Admitting: Family Medicine

## 2014-07-24 DIAGNOSIS — N281 Cyst of kidney, acquired: Secondary | ICD-10-CM | POA: Diagnosis not present

## 2014-07-24 DIAGNOSIS — I700 Atherosclerosis of aorta: Secondary | ICD-10-CM | POA: Diagnosis not present

## 2014-07-24 DIAGNOSIS — R312 Other microscopic hematuria: Secondary | ICD-10-CM | POA: Diagnosis not present

## 2014-07-24 DIAGNOSIS — N2889 Other specified disorders of kidney and ureter: Secondary | ICD-10-CM | POA: Diagnosis not present

## 2014-07-24 DIAGNOSIS — K4190 Unilateral femoral hernia, without obstruction or gangrene, not specified as recurrent: Secondary | ICD-10-CM | POA: Diagnosis not present

## 2014-07-31 ENCOUNTER — Ambulatory Visit (INDEPENDENT_AMBULATORY_CARE_PROVIDER_SITE_OTHER): Payer: 59 | Admitting: Emergency Medicine

## 2014-07-31 VITALS — BP 140/80 | HR 81 | Temp 98.4°F | Resp 18 | Ht 63.5 in | Wt 237.0 lb

## 2014-07-31 DIAGNOSIS — M353 Polymyalgia rheumatica: Secondary | ICD-10-CM | POA: Diagnosis not present

## 2014-07-31 DIAGNOSIS — M25511 Pain in right shoulder: Secondary | ICD-10-CM | POA: Diagnosis not present

## 2014-07-31 DIAGNOSIS — M4726 Other spondylosis with radiculopathy, lumbar region: Secondary | ICD-10-CM | POA: Diagnosis not present

## 2014-07-31 DIAGNOSIS — Z79891 Long term (current) use of opiate analgesic: Secondary | ICD-10-CM | POA: Diagnosis not present

## 2014-07-31 DIAGNOSIS — G894 Chronic pain syndrome: Secondary | ICD-10-CM | POA: Diagnosis not present

## 2014-07-31 LAB — POCT SEDIMENTATION RATE: POCT SED RATE: 70 mm/hr — AB (ref 0–22)

## 2014-07-31 LAB — URIC ACID: URIC ACID, SERUM: 4 mg/dL (ref 2.4–7.0)

## 2014-07-31 MED ORDER — PREDNISONE 10 MG PO TABS: 20.0000 mg | ORAL_TABLET | Freq: Every day | ORAL | Status: DC

## 2014-07-31 NOTE — Progress Notes (Signed)
[facility] [address] [place] [address] [phone]  Date:  07/31/2014   Name:  [name]   DOB:  02-18-1936   MRN:  [id number]  PCP:  [name], MD    Chief Complaint: Foot Pain   History of Present Illness:  [name] is a 78 y.o. very pleasant female patient who presents with the following:  Patient has history of polymyalgia rheumatica.  Was just given a round of prednisone a month ago for an exacerbation with pain in the left arm She says she responded to that treatment and now has sudden pain and swelling in the right foot. She has no history of injury. No relief of pain with daily narcotics.  Saw her pain doctor today. She has no fever or chills Marked difficulty with weight bearing and using a cane No improvement with over the counter medications or other home remedies.  Denies other complaint or health concern today.   Patient Active Problem List   Diagnosis Date Noted  . Microscopic hematuria 05/21/2014  . PMR (polymyalgia rheumatica) 05/15/2014  . Elevated sed rate 04/30/2014  . UTI (urinary tract infection) 04/30/2014  . Other malaise and fatigue 04/17/2014  . Diverticulosis 04/17/2014  . Bilateral arm pain 04/17/2014  . Abdominal pain, LLQ 04/17/2014  . Former smoker 12/12/2012  . Muscle cramps 07/12/2012  . Depression 04/19/2011  . Pre-syncope 03/08/2011  . OBESITY 10/03/2007  . VERTIGO 04/11/2007  . HYPOTHYROIDISM 12/27/2006  . HYPERCHOLESTEROLEMIA 12/27/2006  . HYPERTENSION 12/27/2006  . SUPRAVENTRICULAR TACHYCARDIA 12/27/2006  . ALLERGIC RHINITIS 12/27/2006  . COPD 12/27/2006  . OSTEOARTHRITIS 12/27/2006  . BACK PAIN, CHRONIC 12/27/2006    Past Medical History  Diagnosis Date  . Hypertension   . Hyperlipidemia   . PVC's (premature ventricular contractions)   . Tobacco dependence   . IBS (irritable bowel syndrome)   . Diverticulitis   . Palpitations   . Chronic pain     Managed by Dr.  Hardin Negus  . SVT (supraventricular tachycardia)     Past Surgical History  Procedure Laterality Date  . Tonsillectomy    . Nasal sinus surgery    . Wrist fracture surgery    . Replacement total knee      History  Substance Use Topics  . Smoking status: Former Smoker -- 0.50 packs/day    Quit date: 12/12/2011  . Smokeless tobacco: Never Used  . Alcohol Use: No    Family History  Problem Relation Age of Onset  . Diabetes Mother   . Heart attack Father   . Heart disease Sister     Allergies  Allergen Reactions  . Amoxicillin-Pot Clavulanate     REACTION: diarrhea  . Aspirin Nausea And Vomiting  . Atorvastatin     REACTION: feel weak  . Carisoprodol     REACTION: intolerant  . Codeine     REACTION: pt. reports allergy to codeine, reaction not known  . Codeine Phosphate     REACTION: UNKNOWN  . Cortisone     REACTION: pt. reports allergy, reaction not known  . Ezetimibe-Simvastatin     REACTION: feels weak  . Levothyroxine Sodium     REACTION: reaction not known  . Naproxen Sodium     REACTION: GI symptoms  . Omeprazole     REACTION: abd pain  . Quinapril Hcl     REACTION: reports allergy, reaction not known  . Rofecoxib     REACTION: reports allergy, reaction  not known    Medication list has been reviewed and updated.  Current Outpatient Prescriptions on File Prior to Visit  Medication Sig Dispense Refill  . meclizine (ANTIVERT) 25 MG tablet Take 1 tablet (25 mg total) by mouth 3 (three) times daily as needed for dizziness. 30 tablet 3  . metoprolol tartrate (LOPRESSOR) 25 MG tablet TAKE 1 TABLET (25 MG TOTAL) BY MOUTH 2 (TWO) TIMES DAILY. 180 tablet 0  . oxycodone (OXY-IR) 5 MG capsule Take 5 mg by mouth every 4 (four) hours as needed.    . Oxycodone HCl 20 MG TABS Take by mouth 2 (two) times daily.    . pantoprazole (PROTONIX) 40 MG tablet Take 1 tablet (40 mg total) by mouth daily. 90 tablet 3  . sertraline (ZOLOFT) 50 MG tablet TAKE 1 TABLET BY MOUTH  EVERY DAY 90 tablet 3  . spironolactone (ALDACTONE) 25 MG tablet TAKE 1 TABLET (25 MG TOTAL) BY MOUTH DAILY. 90 tablet 0  . thyroid (ARMOUR THYROID) 60 MG tablet Take 1 tablet (60 mg total) by mouth daily before breakfast. Please make an appointment with Dr, with fasting labs prior, for more refills. 90 tablet 0  . Wheat Dextrin (BENEFIBER PO) Take by mouth 2 (two) times daily.    . predniSONE (DELTASONE) 20 MG tablet Take 1 tablet (20 mg total) by mouth daily with breakfast. (Patient not taking: Reported on 07/31/2014) 30 tablet 1   No current facility-administered medications on file prior to visit.    Review of Systems:  As per HPI, otherwise negative.    Physical Examination: Filed Vitals:   07/31/14 1446  BP: 140/80  Pulse: 81  Temp: 98.4 F (36.9 C)  Resp: 18   Filed Vitals:   07/31/14 1446  Height: 5' 3.5" (1.613 m)  Weight: 237 lb (107.502 kg)   Body mass index is 41.32 kg/(m^2). Ideal Body Weight: Weight in (lb) to have BMI = 25: 143.1   GEN: WDWN, NAD, Non-toxic, Alert & Oriented x 3 HEENT: Atraumatic, Normocephalic.  Ears and Nose: No external deformity. EXTR: No clubbing/cyanosis/edema NEURO: Normal gait.  PSYCH: Normally interactive. Conversant. Not depressed or anxious appearing.  Calm demeanor.  Right foot:  Tender and swollen and red medial malleolus   Assessment and Plan: Gout vs PMR Prednisone  Signed,  Ellison Carwin, MD

## 2014-07-31 NOTE — Patient Instructions (Signed)
Gout Gout is an inflammatory arthritis caused by a buildup of uric acid crystals in the joints. Uric acid is a chemical that is normally present in the blood. When the level of uric acid in the blood is too high it can form crystals that deposit in your joints and tissues. This causes joint redness, soreness, and swelling (inflammation). Repeat attacks are common. Over time, uric acid crystals can form into masses (tophi) near a joint, destroying bone and causing disfigurement. Gout is treatable and often preventable. CAUSES  The disease begins with elevated levels of uric acid in the blood. Uric acid is produced by your body when it breaks down a naturally found substance called purines. Certain foods you eat, such as meats and fish, contain high amounts of purines. Causes of an elevated uric acid level include:  Being passed down from parent to child (heredity).  Diseases that cause increased uric acid production (such as obesity, psoriasis, and certain cancers).  Excessive alcohol use.  Diet, especially diets rich in meat and seafood.  Medicines, including certain cancer-fighting medicines (chemotherapy), water pills (diuretics), and aspirin.  Chronic kidney disease. The kidneys are no longer able to remove uric acid well.  Problems with metabolism. Conditions strongly associated with gout include:  Obesity.  High blood pressure.  High cholesterol.  Diabetes. Not everyone with elevated uric acid levels gets gout. It is not understood why some people get gout and others do not. Surgery, joint injury, and eating too much of certain foods are some of the factors that can lead to gout attacks. SYMPTOMS   An attack of gout comes on quickly. It causes intense pain with redness, swelling, and warmth in a joint.  Fever can occur.  Often, only one joint is involved. Certain joints are more commonly involved:  Base of the big toe.  Knee.  Ankle.  Wrist.  Finger. Without  treatment, an attack usually goes away in a few days to weeks. Between attacks, you usually will not have symptoms, which is different from many other forms of arthritis. DIAGNOSIS  Your caregiver will suspect gout based on your symptoms and exam. In some cases, tests may be recommended. The tests may include:  Blood tests.  Urine tests.  X-rays.  Joint fluid exam. This exam requires a needle to remove fluid from the joint (arthrocentesis). Using a microscope, gout is confirmed when uric acid crystals are seen in the joint fluid. TREATMENT  There are two phases to gout treatment: treating the sudden onset (acute) attack and preventing attacks (prophylaxis).  Treatment of an Acute Attack.  Medicines are used. These include anti-inflammatory medicines or steroid medicines.  An injection of steroid medicine into the affected joint is sometimes necessary.  The painful joint is rested. Movement can worsen the arthritis.  You may use warm or cold treatments on painful joints, depending which works best for you.  Treatment to Prevent Attacks.  If you suffer from frequent gout attacks, your caregiver may advise preventive medicine. These medicines are started after the acute attack subsides. These medicines either help your kidneys eliminate uric acid from your body or decrease your uric acid production. You may need to stay on these medicines for a very long time.  The early phase of treatment with preventive medicine can be associated with an increase in acute gout attacks. For this reason, during the first few months of treatment, your caregiver may also advise you to take medicines usually used for acute gout treatment. Be sure you   understand your caregiver's directions. Your caregiver may make several adjustments to your medicine dose before these medicines are effective.  Discuss dietary treatment with your caregiver or dietitian. Alcohol and drinks high in sugar and fructose and foods  such as meat, poultry, and seafood can increase uric acid levels. Your caregiver or dietitian can advise you on drinks and foods that should be limited. HOME CARE INSTRUCTIONS   Do not take aspirin to relieve pain. This raises uric acid levels.  Only take over-the-counter or prescription medicines for pain, discomfort, or fever as directed by your caregiver.  Rest the joint as much as possible. When in bed, keep sheets and blankets off painful areas.  Keep the affected joint raised (elevated).  Apply warm or cold treatments to painful joints. Use of warm or cold treatments depends on which works best for you.  Use crutches if the painful joint is in your leg.  Drink enough fluids to keep your urine clear or pale yellow. This helps your body get rid of uric acid. Limit alcohol, sugary drinks, and fructose drinks.  Follow your dietary instructions. Pay careful attention to the amount of protein you eat. Your daily diet should emphasize fruits, vegetables, whole grains, and fat-free or low-fat milk products. Discuss the use of coffee, vitamin C, and cherries with your caregiver or dietitian. These may be helpful in lowering uric acid levels.  Maintain a healthy body weight. SEEK MEDICAL CARE IF:   You develop diarrhea, vomiting, or any side effects from medicines.  You do not feel better in 24 hours, or you are getting worse. SEEK IMMEDIATE MEDICAL CARE IF:   Your joint becomes suddenly more tender, and you have chills or a fever. MAKE SURE YOU:   Understand these instructions.  Will watch your condition.  Will get help right away if you are not doing well or get worse. Document Released: 08/06/2000 Document Revised: 12/24/2013 Document Reviewed: 03/22/2012 [facility] Patient Information 2015 [place], [place]. This information is not intended to replace advice given to you by your health care provider. Make sure you discuss any questions you have with your health care  provider. Polymyalgia Rheumatica Polymyalgia rheumatica (also called PMR or polymyalgia) is a rheumatologic (arthritic) condition that causes pain and morning stiffness in your neck, shoulders, and hips. It is an inflammatory condition. In some people, inflammation of certain structures in the shoulder, hips, or other joints can be seen on special testing. It does not cause joint destruction, as occurs in other arthritic conditions. It usually occurs after 76 years of age, and is more common as you age. It can be confused with several other diseases, but it is usually easily treated. People with PMR often have, or can develop, a more severe rheumatologic condition called giant cell arteritis (also called CGA or temporal arteritis).  CAUSES  The exact cause of PMR is not known.   There are genetic factors involved.  Viruses have been suspected in the cause of PMR. This has not been proven. SYMPTOMS   Aching, pain, and morning stiffness your neck, both shoulders, or both hips.  Symptoms usually start slowly and build gradually.  Morning stiffness usually lasts at least 30 minutes.  Swelling and tenderness in other joints of the arms, hands, legs, and feet may occur.  Swelling and inflammation in the wrists can cause nerve inflammation at the wrist (carpal tunnel syndrome).  You may also have low grade fever, fatigue, weakness, decreased appetite and weight loss. DIAGNOSIS   Your caregiver  may suspect that you have PMR based on your description of your symptoms and on your exam.  Your caregiver will examine you to be sure you do not have diseases that can be confused with PMR. These diseases include rheumatoid arthritis, fibromyalgia, or thyroid disease.  Your caregiver should check for signs of giant cell arteritis. This can cause serious complications such as blindness.  Lab tests can help confirm that you have PMR and not other diseases, but are sometimes inconclusive.  X-rays cannot  show PMR. However, it can identify other diseases like rheumatoid arthritis. Your caregiver may have you see a specialist in arthritis and inflammatory diseases (rheumatologist). TREATMENT  The goal of treatment is relief of symptoms. Treatment does not shorten the course of the illness or prevent complications. With proper treatment, you usually feel better almost right away.   The initial treatment of PMR is usually a cortisone (steroid) medication. Your caregiver will help determine a starting dose. The dose is gradually reduced every few weeks to months. Treatment usually lasts one to three years.  Other stronger medications are rarely needed. They will only be prescribed if your symptoms do not get better on cortisone medication alone, or if they recur as the dose is reduced.  Cortisone medication can have different side effects. With the doses of cortisone needed for PMR, the side effects can affect bones and joints, blood sugar control in diabetes, and mood changes. Discuss this with your caregiver.  Your caregiver will evaluate you regularly during your treatment. They will do this in order to assess progress and to check for complications of the illness or treatment.  Physical therapy is sometimes useful. This is especially true if your joints are still stiff after other symptoms have improved. HOME CARE INSTRUCTIONS   Follow your caregiver's instructions. Do not change your dose of cortisone medication on your own.  Keep your appointments for follow-up lab tests and caregiver visits. Your lab tests need to be monitored. You must get checked periodically for giant cell arteritis.  Follow your caregiver's guidance regarding physical activity (usually no restrictions are needed) or physical therapy.  Your caregiver may have instructions to prevent or check for side effects from cortisone medication (including bone density testing or treatment). Follow their instructions carefully. SEEK  MEDICAL CARE IF:   You develop any side effects from treatment. Side effects can include:  Elevated blood pressure.  High blood sugar (or worsening of diabetes, if you are diabetic).  Difficulty fighting off infections.  Weight gain.  Weakness of the bones (osteoporosis).  Your aches, pains, morning stiffness, or other symptoms get worse with time. This is especially true after your dose of cortisone is reduced.  You develop new joint symptoms (pain, swelling, etc.) SEEK IMMEDIATE MEDICAL CARE IF:   You develop a severe headache.  You start vomiting.  You have problems with your vision.  You have an oral temperature above 102 F (38.9 C), not controlled by medicine. Document Released: 09/16/2004 Document Revised: 07/26/2012 Document Reviewed: 12/30/2008 [facility] Patient Information 2015 [place], [place]. This information is not intended to replace advice given to you by your health care provider. Make sure you discuss any questions you have with your health care provider.

## 2014-08-05 ENCOUNTER — Ambulatory Visit (INDEPENDENT_AMBULATORY_CARE_PROVIDER_SITE_OTHER): Payer: 59 | Admitting: Family Medicine

## 2014-08-05 ENCOUNTER — Ambulatory Visit (INDEPENDENT_AMBULATORY_CARE_PROVIDER_SITE_OTHER)
Admission: RE | Admit: 2014-08-05 | Discharge: 2014-08-05 | Disposition: A | Discharge status: Home / Self Care | Payer: 59 | Source: Ambulatory Visit | Attending: Family Medicine | Admitting: Family Medicine

## 2014-08-05 ENCOUNTER — Encounter: Payer: Self-pay | Admitting: Family Medicine

## 2014-08-05 VITALS — BP 136/86 | HR 67 | Temp 97.7°F | Ht 63.5 in | Wt 239.5 lb

## 2014-08-05 DIAGNOSIS — M353 Polymyalgia rheumatica: Secondary | ICD-10-CM

## 2014-08-05 DIAGNOSIS — M19011 Primary osteoarthritis, right shoulder: Secondary | ICD-10-CM | POA: Diagnosis not present

## 2014-08-05 DIAGNOSIS — M79671 Pain in right foot: Secondary | ICD-10-CM | POA: Diagnosis not present

## 2014-08-05 DIAGNOSIS — M7989 Other specified soft tissue disorders: Secondary | ICD-10-CM | POA: Diagnosis not present

## 2014-08-05 DIAGNOSIS — M25511 Pain in right shoulder: Secondary | ICD-10-CM | POA: Diagnosis not present

## 2014-08-05 DIAGNOSIS — N3946 Mixed incontinence: Secondary | ICD-10-CM | POA: Diagnosis not present

## 2014-08-05 LAB — SEDIMENTATION RATE: Sed Rate: 32 mm/hr — ABNORMAL HIGH (ref 0–22)

## 2014-08-05 NOTE — Patient Instructions (Signed)
Go home and review medicine bottles-you should be on 20 mg of prednisone daily  xrays today Labs today  We will contact you with results and plan

## 2014-08-05 NOTE — Assessment & Plan Note (Signed)
With swelling of medial ankle now improved High ESR in UC (hx of PMR) and nl uric acid  Improved/not gone  The swelling is atypical for PMR  Xray foot and ankle today  Continue 20 mg prednisone and also check ESR

## 2014-08-05 NOTE — Progress Notes (Signed)
Subjective:    Patient ID: [name], female    DOB: 1936/06/28, 78 y.o.   MRN: [id number]  HPI Here for joint pain  Went to UC on 12/9 and R foot was swollen and painful  ? Dx of gout or PMR - then had labs and told it was not gout  Put on 20 mg of prednisone  It does help  Swelling is down but not all the way  Her arm also hurts - down into the deltoid area (other arm is at times sore)   Currently the foot is 40% better  Currently the arm is 40% better   Do not want to keep on prednisone   Sed rate of 67  Was interested in meloxicam - but she is all to asa   Last ortho - Dr Alvan Dame  She does go to pain clinic Dr Hardin Negus  Has not seen a rheumatologist   Patient Active Problem List   Diagnosis Date Noted  . Microscopic hematuria 05/21/2014  . PMR (polymyalgia rheumatica) 05/15/2014  . Elevated sed rate 04/30/2014  . UTI (urinary tract infection) 04/30/2014  . Other malaise and fatigue 04/17/2014  . Diverticulosis 04/17/2014  . Bilateral arm pain 04/17/2014  . Abdominal pain, LLQ 04/17/2014  . Former smoker 12/12/2012  . Muscle cramps 07/12/2012  . Depression 04/19/2011  . Pre-syncope 03/08/2011  . OBESITY 10/03/2007  . VERTIGO 04/11/2007  . HYPOTHYROIDISM 12/27/2006  . HYPERCHOLESTEROLEMIA 12/27/2006  . HYPERTENSION 12/27/2006  . SUPRAVENTRICULAR TACHYCARDIA 12/27/2006  . ALLERGIC RHINITIS 12/27/2006  . COPD 12/27/2006  . OSTEOARTHRITIS 12/27/2006  . BACK PAIN, CHRONIC 12/27/2006   Past Medical History  Diagnosis Date  . Hypertension   . Hyperlipidemia   . PVC's (premature ventricular contractions)   . Tobacco dependence   . IBS (irritable bowel syndrome)   . Diverticulitis   . Palpitations   . Chronic pain     Managed by Dr. Hardin Negus  . SVT (supraventricular tachycardia)    Past Surgical History  Procedure Laterality Date  . Tonsillectomy    . Nasal sinus surgery    . Wrist fracture surgery    . Replacement total knee     History    Substance Use Topics  . Smoking status: Former Smoker -- 0.50 packs/day    Quit date: 12/12/2011  . Smokeless tobacco: Never Used  . Alcohol Use: No   Family History  Problem Relation Age of Onset  . Diabetes Mother   . Heart attack Father   . Heart disease Sister    Allergies  Allergen Reactions  . Amoxicillin-Pot Clavulanate     REACTION: diarrhea  . Aspirin Nausea And Vomiting  . Atorvastatin     REACTION: feel weak  . Carisoprodol     REACTION: intolerant  . Codeine     REACTION: pt. reports allergy to codeine, reaction not known  . Codeine Phosphate     REACTION: UNKNOWN  . Cortisone     REACTION: pt. reports allergy, reaction not known  . Ezetimibe-Simvastatin     REACTION: feels weak  . Levothyroxine Sodium     REACTION: reaction not known  . Naproxen Sodium     REACTION: GI symptoms  . Omeprazole     REACTION: abd pain  . Quinapril Hcl     REACTION: reports allergy, reaction not known  . Rofecoxib     REACTION: reports allergy, reaction not known   Current Outpatient Prescriptions on File Prior to Visit  Medication  Sig Dispense Refill  . meclizine (ANTIVERT) 25 MG tablet Take 1 tablet (25 mg total) by mouth 3 (three) times daily as needed for dizziness. 30 tablet 3  . metoprolol tartrate (LOPRESSOR) 25 MG tablet TAKE 1 TABLET (25 MG TOTAL) BY MOUTH 2 (TWO) TIMES DAILY. 180 tablet 0  . oxycodone (OXY-IR) 5 MG capsule Take 5 mg by mouth every 4 (four) hours as needed.    . Oxycodone HCl 20 MG TABS Take by mouth 2 (two) times daily.    . pantoprazole (PROTONIX) 40 MG tablet Take 1 tablet (40 mg total) by mouth daily. 90 tablet 3  . predniSONE (DELTASONE) 10 MG tablet Take 2 tablets (20 mg total) by mouth daily with breakfast. 60 tablet 0  . predniSONE (DELTASONE) 20 MG tablet Take 1 tablet (20 mg total) by mouth daily with breakfast. 30 tablet 1  . sertraline (ZOLOFT) 50 MG tablet TAKE 1 TABLET BY MOUTH EVERY DAY 90 tablet 3  . spironolactone (ALDACTONE) 25 MG  tablet TAKE 1 TABLET (25 MG TOTAL) BY MOUTH DAILY. 90 tablet 0  . thyroid (ARMOUR THYROID) 60 MG tablet Take 1 tablet (60 mg total) by mouth daily before breakfast. Please make an appointment with Dr, with fasting labs prior, for more refills. 90 tablet 0  . Wheat Dextrin (BENEFIBER PO) Take by mouth 2 (two) times daily.     No current facility-administered medications on file prior to visit.       Review of Systems Review of Systems  Constitutional: Negative for fever, appetite change, fatigue and unexpected weight change.  Eyes: Negative for pain and visual disturbance.  Respiratory: Negative for cough and shortness of breath.   Cardiovascular: Negative for cp or palpitations    Gastrointestinal: Negative for nausea, diarrhea and constipation.  Genitourinary: Negative for urgency and frequency.  Skin: Negative for pallor or rash   MSK pos for R ankle/foot pain with swelling and R shoulder pain , chronic back pain  Neurological: Negative for weakness, light-headedness, numbness and headaches.  Hematological: Negative for adenopathy. Does not bruise/bleed easily.  Psychiatric/Behavioral: Negative for dysphoric mood. The patient is not nervous/anxious.         Objective:   Physical Exam  Constitutional: She appears well-developed and well-nourished. No distress.  obese and well appearing   HENT:  Head: Normocephalic and atraumatic.  Mouth/Throat: Oropharynx is clear and moist.  Eyes: Conjunctivae and EOM are normal. Pupils are equal, round, and reactive to light. No scleral icterus.  Neck: Normal range of motion. Neck supple.  Cardiovascular: Normal rate, regular rhythm, normal heart sounds and intact distal pulses.  Exam reveals no gallop.   Pulmonary/Chest: Effort normal and breath sounds normal. No respiratory distress. She has no wheezes.  Musculoskeletal: She exhibits edema and tenderness.       Right shoulder: She exhibits decreased range of motion, tenderness and bony  tenderness. She exhibits no swelling, no effusion, no spasm and normal strength.       Right ankle: She exhibits decreased range of motion and swelling. She exhibits no ecchymosis and no deformity. Tenderness. Posterior TFL and head of 5th metatarsal tenderness found. Achilles tendon normal.       Right foot: There is tenderness and bony tenderness.  Large medial bunions bilaterally   Pos hawking/neer tests R shoulder   Lymphadenopathy:    She has no cervical adenopathy.  Neurological: She is alert. She has normal reflexes.  Skin: Skin is warm and dry. No rash noted. No erythema.  Psychiatric: She has a normal mood and affect.          Assessment & Plan:   Problem List Items Addressed This Visit      Other   PMR (polymyalgia rheumatica) - Primary    exac noted at urgent care with inc esr  Continue 20 mg prednisone  Some symptoms improved (also other orthopedic problems)  xrays today Re check ESR    Relevant Orders      Sedimentation Rate (Completed)   Right foot pain    With swelling of medial ankle now improved High ESR in UC (hx of PMR) and nl uric acid  Improved/not gone  The swelling is atypical for PMR  Xray foot and ankle today  Continue 20 mg prednisone and also check ESR    Relevant Orders      DG Foot Complete Right (Completed)      DG Ankle Complete Right (Completed)   Right shoulder pain    With inability to fully abduct it and acromion tenderness Some imp with prednisone  Hx of PMR  Xray today      Relevant Orders      DG Shoulder Right (Completed)

## 2014-08-05 NOTE — Assessment & Plan Note (Signed)
exac noted at urgent care with inc esr  Continue 20 mg prednisone  Some symptoms improved (also other orthopedic problems)  xrays today Re check ESR

## 2014-08-05 NOTE — Progress Notes (Signed)
Pre visit review using our clinic review tool, if applicable. No additional management support is needed unless otherwise documented below in the visit note. 

## 2014-08-05 NOTE — Assessment & Plan Note (Signed)
With inability to fully abduct it and acromion tenderness Some imp with prednisone  Hx of PMR  Xray today

## 2014-08-12 ENCOUNTER — Telehealth: Payer: Self-pay

## 2014-08-12 NOTE — Telephone Encounter (Signed)
I do not think it is the prednisone  I would like to check some labs for chemistries (primarily K and mag levels)  Get a magnesium supplement and take 250 mg daily -this may help  Let me know when her lab appt is and I will put orders in

## 2014-08-12 NOTE — Telephone Encounter (Signed)
Pt left v/m; pt wants to know if prednisone could be causing cramps; spoke with pt; cramps are in rt leg and all fingers; comes and goes and pt has had since 08/10/14. Pt has had previous episodes of cramping prior to taking prednisone. Pt request cb.

## 2014-08-13 NOTE — Telephone Encounter (Signed)
Keep me posted if symptoms continue or worsen

## 2014-08-13 NOTE — Telephone Encounter (Signed)
Pt advise

## 2014-08-13 NOTE — Telephone Encounter (Signed)
Pt notified of Dr. Marliss Coots recommendations. Pt declined to scheduled lab appt., she said she is going to so many doctors she doesn't want to come for labs, pt said she will just wait until her f/u appt with Dr. Glori Bickers in Feb. Pt said she will just start taking the magnesium supplement and see if that will help

## 2014-08-20 DIAGNOSIS — R312 Other microscopic hematuria: Secondary | ICD-10-CM | POA: Diagnosis not present

## 2014-08-20 DIAGNOSIS — N393 Stress incontinence (female) (male): Secondary | ICD-10-CM | POA: Diagnosis not present

## 2014-08-20 DIAGNOSIS — N3941 Urge incontinence: Secondary | ICD-10-CM | POA: Diagnosis not present

## 2014-08-30 DIAGNOSIS — G894 Chronic pain syndrome: Secondary | ICD-10-CM | POA: Diagnosis not present

## 2014-08-30 DIAGNOSIS — M25511 Pain in right shoulder: Secondary | ICD-10-CM | POA: Diagnosis not present

## 2014-08-30 DIAGNOSIS — Z79891 Long term (current) use of opiate analgesic: Secondary | ICD-10-CM | POA: Diagnosis not present

## 2014-08-30 DIAGNOSIS — M4726 Other spondylosis with radiculopathy, lumbar region: Secondary | ICD-10-CM | POA: Diagnosis not present

## 2014-09-01 ENCOUNTER — Other Ambulatory Visit: Payer: Self-pay | Admitting: Family Medicine

## 2014-09-02 NOTE — Telephone Encounter (Signed)
That is ok  Refill for a year please

## 2014-09-02 NOTE — Telephone Encounter (Signed)
done

## 2014-09-02 NOTE — Telephone Encounter (Signed)
Received refill request electronically from pharmacy. See allergy/contrainidcation. Is it okay to refill medication?

## 2014-09-10 ENCOUNTER — Encounter: Payer: Self-pay | Admitting: Cardiology

## 2014-09-10 ENCOUNTER — Ambulatory Visit (INDEPENDENT_AMBULATORY_CARE_PROVIDER_SITE_OTHER): Payer: 59 | Admitting: Cardiology

## 2014-09-10 VITALS — BP 122/70 | HR 61 | Ht 67.0 in | Wt 234.8 lb

## 2014-09-10 DIAGNOSIS — I10 Essential (primary) hypertension: Secondary | ICD-10-CM

## 2014-09-10 DIAGNOSIS — I471 Supraventricular tachycardia: Secondary | ICD-10-CM | POA: Diagnosis not present

## 2014-09-10 NOTE — Patient Instructions (Signed)
Continue your current therapy  I will see you 6 months   

## 2014-09-10 NOTE — Progress Notes (Signed)
[name] Date of Birth: 01-20-1936   History of Present Illness: [name] is seen today for followup SVT. She has a history of SVT and PVCs. She is on chronic metoprolol therapy. She is doing very well from a cardiac standpoint. She denies any symptoms of chest pain, palpitations, or shortness of breath. Very infrequently she may feel lightheaded.  Current Outpatient Prescriptions on File Prior to Visit  Medication Sig Dispense Refill  . meclizine (ANTIVERT) 25 MG tablet Take 1 tablet (25 mg total) by mouth 3 (three) times daily as needed for dizziness. 30 tablet 3  . metoprolol tartrate (LOPRESSOR) 25 MG tablet TAKE 1 TABLET (25 MG TOTAL) BY MOUTH 2 (TWO) TIMES DAILY. 180 tablet 0  . pantoprazole (PROTONIX) 40 MG tablet TAKE 1 TABLET (40 MG TOTAL) BY MOUTH DAILY. 90 tablet 3  . sertraline (ZOLOFT) 50 MG tablet TAKE 1 TABLET BY MOUTH EVERY DAY 90 tablet 3  . spironolactone (ALDACTONE) 25 MG tablet TAKE 1 TABLET (25 MG TOTAL) BY MOUTH DAILY. 90 tablet 0  . thyroid (ARMOUR THYROID) 60 MG tablet Take 1 tablet (60 mg total) by mouth daily before breakfast. Please make an appointment with Dr, with fasting labs prior, for more refills. 90 tablet 0  . Wheat Dextrin (BENEFIBER PO) Take by mouth 2 (two) times daily.     No current facility-administered medications on file prior to visit.    Allergies  Allergen Reactions  . Amoxicillin-Pot Clavulanate     REACTION: diarrhea  . Aspirin Nausea And Vomiting  . Atorvastatin     REACTION: feel weak  . Carisoprodol     REACTION: intolerant  . Codeine     REACTION: pt. reports allergy to codeine, reaction not known  . Codeine Phosphate     REACTION: UNKNOWN  . Cortisone     REACTION: pt. reports allergy, reaction not known  . Ezetimibe-Simvastatin     REACTION: feels weak  . Levothyroxine Sodium     REACTION: reaction not known  . Naproxen Sodium     REACTION: GI symptoms  . Omeprazole     REACTION: abd pain  . Quinapril Hcl    REACTION: reports allergy, reaction not known  . Rofecoxib     REACTION: reports allergy, reaction not known    Past Medical History  Diagnosis Date  . Hypertension   . Hyperlipidemia   . PVC's (premature ventricular contractions)   . Tobacco dependence   . IBS (irritable bowel syndrome)   . Diverticulitis   . Palpitations   . Chronic pain     Managed by Dr. Hardin Negus  . SVT (supraventricular tachycardia)     Past Surgical History  Procedure Laterality Date  . Tonsillectomy    . Nasal sinus surgery    . Wrist fracture surgery    . Replacement total knee      History  Smoking status  . Former Smoker -- 0.50 packs/day  . Quit date: 12/12/2011  Smokeless tobacco  . Never Used    History  Alcohol Use No    Family History  Problem Relation Age of Onset  . Diabetes Mother   . Heart attack Father   . Heart disease Sister     Review of Systems: The review of systems is as above. She does complain of joint pain and swelling that did respond to steroids. Managed by Dr. Glori Bickers.  All other systems were reviewed and are negative.  Physical Exam: BP 122/70 mmHg  Pulse  61  Ht 5\' 7"  (1.702 m)  Wt 234 lb 12.8 oz (106.505 kg)  BMI 36.77 kg/m2 Patient is in no acute distress.  She is obese. Skin is warm and dry. Color is normal.  HEENT is unremarkable. Normocephalic/atraumatic. PERRL. Sclera are nonicteric. Neck is supple. No masses. No JVD. Lungs are clear. Cardiac exam shows a regular rate and rhythm.  Abdomen is soft and obese. Extremities are without edema. She walks with a cane.  No gross neurologic deficits noted.  LABORATORY DATA:   Lab Results  Component Value Date   WBC 8.6 04/17/2014   HGB 13.4 04/17/2014   HCT 40.9 04/17/2014   PLT 198.0 04/17/2014   GLUCOSE 103* 04/17/2014   CHOL 190 04/17/2014   TRIG 124.0 04/17/2014   HDL 37.60* 04/17/2014   LDLDIRECT 136.4 05/24/2012   LDLCALC 128* 04/17/2014   ALT 16 04/17/2014   AST 26 04/17/2014   NA 138 04/17/2014    K 4.5 04/17/2014   CL 103 04/17/2014   CREATININE 1.1 04/17/2014   BUN 19 04/17/2014   CO2 26 04/17/2014   TSH 0.78 03/06/2013   INR 1.00 12/10/2010   Ecg: today. NSR, normal. I have personally reviewed and interpreted this study.   Assessment / Plan: 1. Supraventricular tachycardia. Well controlled on her current dose of metoprolol. We will continue same dose and followup again in 6 months.  2. PVCs.  3. Hypertension, controlled.  4. History of tobacco abuse now quit.  5. Hypothyroidism

## 2014-09-14 ENCOUNTER — Other Ambulatory Visit: Payer: Self-pay | Admitting: Family Medicine

## 2014-09-30 DIAGNOSIS — G894 Chronic pain syndrome: Secondary | ICD-10-CM | POA: Diagnosis not present

## 2014-09-30 DIAGNOSIS — M4726 Other spondylosis with radiculopathy, lumbar region: Secondary | ICD-10-CM | POA: Diagnosis not present

## 2014-09-30 DIAGNOSIS — M25511 Pain in right shoulder: Secondary | ICD-10-CM | POA: Diagnosis not present

## 2014-09-30 DIAGNOSIS — Z79891 Long term (current) use of opiate analgesic: Secondary | ICD-10-CM | POA: Diagnosis not present

## 2014-10-01 ENCOUNTER — Ambulatory Visit: Payer: 59 | Admitting: Family Medicine

## 2014-10-03 ENCOUNTER — Other Ambulatory Visit: Payer: Self-pay | Admitting: Family Medicine

## 2014-10-08 ENCOUNTER — Ambulatory Visit: Payer: Medicare Other | Admitting: Family Medicine

## 2014-10-10 ENCOUNTER — Telehealth: Payer: Self-pay | Admitting: Family Medicine

## 2014-10-10 MED ORDER — PREDNISONE 20 MG PO TABS: 20.0000 mg | ORAL_TABLET | Freq: Every day | ORAL | Status: DC

## 2014-10-10 NOTE — Telephone Encounter (Signed)
Spoken to patient. Notified her of Dr Marliss Coots comments. [facility].

## 2014-10-10 NOTE — Telephone Encounter (Signed)
If she is back to aching in muscles in arms and shoulders-go back to prednisone 20 mg daily (call in 30 if she needs some) until she can come in If pain is different - or crampy, that may be different, please let me know

## 2014-10-10 NOTE — Telephone Encounter (Signed)
Pt called wanting ot let dr tower know That she has pain in her arms and feet again.  She stated she has already saw dr tower about this.  She wanted to know if dr tower thinks she needs to take predizone until she come in on monday

## 2014-10-14 ENCOUNTER — Encounter: Payer: Self-pay | Admitting: Family Medicine

## 2014-10-14 ENCOUNTER — Ambulatory Visit (INDEPENDENT_AMBULATORY_CARE_PROVIDER_SITE_OTHER): Payer: 59 | Admitting: Family Medicine

## 2014-10-14 VITALS — BP 126/70 | HR 68 | Temp 97.3°F | Ht 67.0 in | Wt 236.8 lb

## 2014-10-14 DIAGNOSIS — M353 Polymyalgia rheumatica: Secondary | ICD-10-CM

## 2014-10-14 DIAGNOSIS — R700 Elevated erythrocyte sedimentation rate: Secondary | ICD-10-CM | POA: Diagnosis not present

## 2014-10-14 DIAGNOSIS — Z79891 Long term (current) use of opiate analgesic: Secondary | ICD-10-CM | POA: Diagnosis not present

## 2014-10-14 DIAGNOSIS — M25511 Pain in right shoulder: Secondary | ICD-10-CM | POA: Diagnosis not present

## 2014-10-14 DIAGNOSIS — M2550 Pain in unspecified joint: Secondary | ICD-10-CM | POA: Insufficient documentation

## 2014-10-14 DIAGNOSIS — G894 Chronic pain syndrome: Secondary | ICD-10-CM | POA: Diagnosis not present

## 2014-10-14 DIAGNOSIS — M4726 Other spondylosis with radiculopathy, lumbar region: Secondary | ICD-10-CM | POA: Diagnosis not present

## 2014-10-14 LAB — RHEUMATOID FACTOR: Rhuematoid fact SerPl-aCnc: <10 IU/mL (ref <=14)

## 2014-10-14 MED ORDER — SPIRONOLACTONE 25 MG PO TABS: ORAL_TABLET | ORAL | Status: DC

## 2014-10-14 MED ORDER — PREDNISONE 20 MG PO TABS: 20.0000 mg | ORAL_TABLET | Freq: Every day | ORAL | Status: DC

## 2014-10-14 NOTE — Assessment & Plan Note (Signed)
Pt unable to get off of prednisone  Highest esr was over 100  Usually in 30s when on prednisone Re check today sympt imp -will continue 76m for 2 wk and then cut to 10 mg daily  Ref to rheumatology Also RF and ANA today

## 2014-10-14 NOTE — Patient Instructions (Signed)
Lab today  Continue the 20 mg prednisone daily for another 2 weeks and then cut dose to 10 mg (1/2 pill) until you see rheumatology Update me if symptoms worsen Stop at check out for rheumatology referral

## 2014-10-14 NOTE — Assessment & Plan Note (Signed)
In addition to her PMR pain - feet are hurting/ joints as well as hands Severe arthritis and bunion deformity ANA and RF today in addn to sed rate

## 2014-10-14 NOTE — Progress Notes (Signed)
Subjective:    Patient ID: [name], female    DOB: 09/06/1935, 79 y.o.   MRN: [id number]  HPI Here for f/u of PMR   Was on prednisone in Dec- not sure when she finished it   Just started again -called with return of the pain in her arms down to her hands R arm is worse  Then her feet - which is more unusual for her   Some swelling in her feet  Around ankles  Feet are sore to the touch  No redness - ? If any discoloration   She did have a knot on the back of her head - and it went down  Does not feel it anymore  No headache   She saw Dr Hardin Negus- disc seeing a rheumatologist  Recommended Dr Estanislado Pandy and Trudie Reed and Amil Amen , and Elnoria Howard prednisone 20 mg 10/10/14  Feeling much better   Patient Active Problem List   Diagnosis Date Noted  . Right foot pain 08/05/2014  . Right shoulder pain 08/05/2014  . Microscopic hematuria 05/21/2014  . PMR (polymyalgia rheumatica) 05/15/2014  . Elevated sed rate 04/30/2014  . UTI (urinary tract infection) 04/30/2014  . Other malaise and fatigue 04/17/2014  . Diverticulosis 04/17/2014  . Bilateral arm pain 04/17/2014  . Abdominal pain, LLQ 04/17/2014  . Former smoker 12/12/2012  . Muscle cramps 07/12/2012  . Depression 04/19/2011  . Pre-syncope 03/08/2011  . OBESITY 10/03/2007  . VERTIGO 04/11/2007  . HYPOTHYROIDISM 12/27/2006  . HYPERCHOLESTEROLEMIA 12/27/2006  . Essential hypertension 12/27/2006  . SVT (supraventricular tachycardia) 12/27/2006  . ALLERGIC RHINITIS 12/27/2006  . COPD 12/27/2006  . OSTEOARTHRITIS 12/27/2006  . BACK PAIN, CHRONIC 12/27/2006   Past Medical History  Diagnosis Date  . Hypertension   . Hyperlipidemia   . PVC's (premature ventricular contractions)   . Tobacco dependence   . IBS (irritable bowel syndrome)   . Diverticulitis   . Palpitations   . Chronic pain     Managed by Dr. Hardin Negus  . SVT (supraventricular tachycardia)    Past Surgical History  Procedure Laterality  Date  . Tonsillectomy    . Nasal sinus surgery    . Wrist fracture surgery    . Replacement total knee     History  Substance Use Topics  . Smoking status: Former Smoker -- 0.50 packs/day    Quit date: 12/12/2011  . Smokeless tobacco: Never Used  . Alcohol Use: No   Family History  Problem Relation Age of Onset  . Diabetes Mother   . Heart attack Father   . Heart disease Sister    Allergies  Allergen Reactions  . Amoxicillin-Pot Clavulanate     REACTION: diarrhea  . Aspirin Nausea And Vomiting  . Atorvastatin     REACTION: feel weak  . Carisoprodol     REACTION: intolerant  . Codeine     REACTION: pt. reports allergy to codeine, reaction not known  . Codeine Phosphate     REACTION: UNKNOWN  . Cortisone     REACTION: pt. reports allergy, reaction not known  . Ezetimibe-Simvastatin     REACTION: feels weak  . Levothyroxine Sodium     REACTION: reaction not known  . Naproxen Sodium     REACTION: GI symptoms  . Omeprazole     REACTION: abd pain  . Quinapril Hcl     REACTION: reports allergy, reaction not known  . Rofecoxib     REACTION: reports allergy, reaction not  known   Current Outpatient Prescriptions on File Prior to Visit  Medication Sig Dispense Refill  . ARMOUR THYROID 60 MG tablet TAKE 1 TABLET (60 MG TOTAL) BY MOUTH DAILY BEFORE BREAKFAST. 90 tablet 2  . meclizine (ANTIVERT) 25 MG tablet Take 1 tablet (25 mg total) by mouth 3 (three) times daily as needed for dizziness. 30 tablet 3  . metoprolol tartrate (LOPRESSOR) 25 MG tablet TAKE 1 TABLET (25 MG TOTAL) BY MOUTH 2 (TWO) TIMES DAILY. 180 tablet 0  . pantoprazole (PROTONIX) 40 MG tablet TAKE 1 TABLET (40 MG TOTAL) BY MOUTH DAILY. 90 tablet 3  . predniSONE (DELTASONE) 20 MG tablet Take 1 tablet (20 mg total) by mouth daily with breakfast. 30 tablet 0  . sertraline (ZOLOFT) 50 MG tablet TAKE 1 TABLET BY MOUTH EVERY DAY 90 tablet 0  . spironolactone (ALDACTONE) 25 MG tablet TAKE 1 TABLET (25 MG TOTAL) BY  MOUTH DAILY. 90 tablet 0  . thyroid (ARMOUR THYROID) 60 MG tablet Take 1 tablet (60 mg total) by mouth daily before breakfast. Please make an appointment with Dr, with fasting labs prior, for more refills. 90 tablet 0  . Wheat Dextrin (BENEFIBER PO) Take by mouth 2 (two) times daily.     No current facility-administered medications on file prior to visit.      Review of Systems Review of Systems  Constitutional: Negative for fever, appetite change, fatigue and unexpected weight change.  Eyes: Negative for pain and visual disturbance.  Respiratory: Negative for cough and shortness of breath.   Cardiovascular: Negative for cp or palpitations    Gastrointestinal: Negative for nausea, diarrhea and constipation.  Genitourinary: Negative for urgency and frequency.  Skin: Negative for pallor or rash   MSK pos for muscle and joint pain /shoulder girdle and hands and feet  Neurological: Negative for weakness, light-headedness, numbness and headaches.  Hematological: Negative for adenopathy. Does not bruise/bleed easily.  Psychiatric/Behavioral: Negative for dysphoric mood. The patient is not nervous/anxious.         Objective:   Physical Exam  Constitutional: She appears well-developed and well-nourished. No distress.  obese and well appearing   HENT:  Head: Normocephalic and atraumatic.  Eyes: Conjunctivae and EOM are normal. Pupils are equal, round, and reactive to light. Right eye exhibits no discharge. Left eye exhibits no discharge. No scleral icterus.  Neck: Normal range of motion. Neck supple. No JVD present.  Cardiovascular: Normal rate and regular rhythm.   Pulmonary/Chest: Effort normal and breath sounds normal. No respiratory distress. She has no wheezes. She has no rales.  Musculoskeletal: She exhibits tenderness. She exhibits no edema.  Tenderness over upper arm musculature and trapezius area bilat  Tender MCP joints Tender MTP joints with severe bunion deformities     Lymphadenopathy:    She has no cervical adenopathy.  Neurological: She is alert. She has normal reflexes. No cranial nerve deficit. She exhibits normal muscle tone. Coordination normal.  Skin: Skin is warm and dry. No rash noted. No erythema.  Psychiatric: She has a normal mood and affect.          Assessment & Plan:   Problem List Items Addressed This Visit      Other   Acute joint pain    In addition to her PMR pain - feet are hurting/ joints as well as hands Severe arthritis and bunion deformity ANA and RF today in addn to sed rate       Relevant Orders   Sedimentation Rate  ANA   Rheumatoid factor   Ambulatory referral to Rheumatology   Elevated sed rate    With pmr  Back on prednisone 20  Re check today      Relevant Orders   Sedimentation Rate   Ambulatory referral to Rheumatology   PMR (polymyalgia rheumatica) - Primary    Pt unable to get off of prednisone  Highest esr was over 100  Usually in 30s when on prednisone Re check today sympt imp -will continue 10m for 2 wk and then cut to 10 mg daily  Ref to rheumatology Also RF and ANA today      Relevant Orders   Sedimentation Rate   Ambulatory referral to Rheumatology

## 2014-10-14 NOTE — Progress Notes (Signed)
Pre visit review using our clinic review tool, if applicable. No additional management support is needed unless otherwise documented below in the visit note. 

## 2014-10-14 NOTE — Assessment & Plan Note (Signed)
With pmr  Back on prednisone 20  Re check today

## 2014-10-15 LAB — SEDIMENTATION RATE: SED RATE: 53 mm/h — AB (ref 0–22)

## 2014-10-16 ENCOUNTER — Telehealth: Payer: Self-pay | Admitting: *Deleted

## 2014-10-16 NOTE — Telephone Encounter (Signed)
Receive prior auth request from insurance company for armour thyroid. I completed verbal PA, and it was approved based on her being intolerant to levothyroxine, and having been on armour thyroid for > 8 years. Prior auth request in your inbox to sign.

## 2014-10-17 LAB — ANTI-NUCLEAR AB-TITER (ANA TITER)

## 2014-10-17 LAB — ANA: ANA: POSITIVE — AB

## 2014-10-21 ENCOUNTER — Other Ambulatory Visit: Payer: Self-pay | Admitting: Family Medicine

## 2014-10-22 ENCOUNTER — Ambulatory Visit: Payer: Medicare Other | Admitting: Family Medicine

## 2014-10-22 NOTE — Telephone Encounter (Signed)
Received faxed letter. Patient has been approved for armour thyroid 60mg   through the period of 10/15/2014 - 10/16/2015.

## 2014-11-05 DIAGNOSIS — M25512 Pain in left shoulder: Secondary | ICD-10-CM | POA: Diagnosis not present

## 2014-11-05 DIAGNOSIS — M25572 Pain in left ankle and joints of left foot: Secondary | ICD-10-CM | POA: Diagnosis not present

## 2014-11-05 DIAGNOSIS — M353 Polymyalgia rheumatica: Secondary | ICD-10-CM | POA: Diagnosis not present

## 2014-11-05 DIAGNOSIS — M25511 Pain in right shoulder: Secondary | ICD-10-CM | POA: Diagnosis not present

## 2014-11-05 DIAGNOSIS — M25571 Pain in right ankle and joints of right foot: Secondary | ICD-10-CM | POA: Diagnosis not present

## 2014-12-09 DIAGNOSIS — G5732 Lesion of lateral popliteal nerve, left lower limb: Secondary | ICD-10-CM | POA: Diagnosis not present

## 2014-12-09 DIAGNOSIS — G894 Chronic pain syndrome: Secondary | ICD-10-CM | POA: Diagnosis not present

## 2014-12-09 DIAGNOSIS — Z79891 Long term (current) use of opiate analgesic: Secondary | ICD-10-CM | POA: Diagnosis not present

## 2014-12-09 DIAGNOSIS — M4726 Other spondylosis with radiculopathy, lumbar region: Secondary | ICD-10-CM | POA: Diagnosis not present

## 2014-12-12 ENCOUNTER — Other Ambulatory Visit: Payer: Self-pay | Admitting: Family Medicine

## 2014-12-12 NOTE — Telephone Encounter (Signed)
done

## 2014-12-12 NOTE — Telephone Encounter (Signed)
Please refill for a year  

## 2014-12-12 NOTE — Telephone Encounter (Signed)
Electronic refill request, last refilled on 09/16/14 #90 with 0 additional refills, please advise

## 2014-12-16 ENCOUNTER — Telehealth: Payer: Self-pay

## 2014-12-16 NOTE — Telephone Encounter (Signed)
Her tsh is up- but since she take Armor thyroid instead of levothyroxine -that may not be a good way to follow the thyroid test  Please ask her to schedule a lab appt non fasting for repeat tsh and free T3 (dx hypothyroid)- the T3 helps me determine whether we need to change the dose  Thanks

## 2014-12-16 NOTE — Telephone Encounter (Signed)
Pt left v/m requesting cb; pt received note from Dr Estanislado Pandy about pt needs to make appt to see Dr Glori Bickers about an abnormal thyroid lab result. Pt wants to know if Dr Glori Bickers needs to see pt.

## 2014-12-16 NOTE — Telephone Encounter (Signed)
Patient notified.  Lab appt. Scheduled 12/24/2014.

## 2014-12-23 DIAGNOSIS — Z79891 Long term (current) use of opiate analgesic: Secondary | ICD-10-CM | POA: Diagnosis not present

## 2014-12-23 DIAGNOSIS — M461 Sacroiliitis, not elsewhere classified: Secondary | ICD-10-CM | POA: Diagnosis not present

## 2014-12-23 DIAGNOSIS — G894 Chronic pain syndrome: Secondary | ICD-10-CM | POA: Diagnosis not present

## 2014-12-23 DIAGNOSIS — M4726 Other spondylosis with radiculopathy, lumbar region: Secondary | ICD-10-CM | POA: Diagnosis not present

## 2014-12-24 ENCOUNTER — Other Ambulatory Visit: Payer: 59

## 2014-12-27 ENCOUNTER — Other Ambulatory Visit: Payer: Self-pay | Admitting: Anesthesiology

## 2014-12-27 ENCOUNTER — Ambulatory Visit
Admission: RE | Admit: 2014-12-27 | Discharge: 2014-12-27 | Disposition: A | Discharge status: Home / Self Care | Payer: 59 | Source: Ambulatory Visit | Attending: Anesthesiology | Admitting: Anesthesiology

## 2014-12-27 DIAGNOSIS — M4317 Spondylolisthesis, lumbosacral region: Secondary | ICD-10-CM | POA: Diagnosis not present

## 2014-12-27 DIAGNOSIS — M533 Sacrococcygeal disorders, not elsewhere classified: Secondary | ICD-10-CM

## 2014-12-30 ENCOUNTER — Telehealth: Payer: Self-pay | Admitting: Family Medicine

## 2014-12-30 NOTE — Telephone Encounter (Signed)
Pt is going to see dr devshwar  Wednesday morning

## 2014-12-30 NOTE — Telephone Encounter (Signed)
Ok- see her as planned - she is familiar with you and that should be helpful  Keep me posted

## 2014-12-30 NOTE — Telephone Encounter (Signed)
It sounds like she wants to see orthopedics - I do not think there are any orthopedic offices within [place]- but I can start the referral process for a new orthopedic doctor in an outside office Is that ok?  I assume she pref [place] ?  Thanks

## 2014-12-30 NOTE — Telephone Encounter (Signed)
Pt has had more back pain, and went to Dr Hardin Negus for xray. She states xray did not show anything.  Pt is requesting to go to a "bone dr" at [facility] to look into it further.  Pt states that she can not walk to get to the bathroom.  She doesn't wat to go to the hospital without anyone being ready for her because she does not want to wait in the Emergency Dept.  Best number to call is [phone], thanks

## 2015-01-01 ENCOUNTER — Other Ambulatory Visit: Payer: 59

## 2015-01-01 DIAGNOSIS — M4726 Other spondylosis with radiculopathy, lumbar region: Secondary | ICD-10-CM | POA: Diagnosis not present

## 2015-01-01 DIAGNOSIS — M461 Sacroiliitis, not elsewhere classified: Secondary | ICD-10-CM | POA: Diagnosis not present

## 2015-01-01 DIAGNOSIS — Z79891 Long term (current) use of opiate analgesic: Secondary | ICD-10-CM | POA: Diagnosis not present

## 2015-01-01 DIAGNOSIS — G894 Chronic pain syndrome: Secondary | ICD-10-CM | POA: Diagnosis not present

## 2015-01-06 ENCOUNTER — Telehealth: Payer: Self-pay | Admitting: *Deleted

## 2015-01-06 DIAGNOSIS — M549 Dorsalgia, unspecified: Principal | ICD-10-CM

## 2015-01-06 DIAGNOSIS — G8929 Other chronic pain: Secondary | ICD-10-CM

## 2015-01-06 NOTE — Telephone Encounter (Signed)
I will do an orthopedic referral  Do not stop going to Dr Hardin Negus also

## 2015-01-06 NOTE — Telephone Encounter (Signed)
Patient called stating that she needs a referral to someone that can do something for her back problem. Patient stated that she saw Dr. Sallyanne Havers last week and she was not able to do anything for her back. Patient stated that she is scheduled to see Dr. Hardin Negus and all he is going to do is send her to [facility] for test. Patient stated that she needs Dr. Glori Bickers to refer her to someone that can do something for her back pain.

## 2015-01-06 NOTE — Telephone Encounter (Signed)
Pt notified referral done and one of our [facility] will call to schedule appt., pt advise to keep going to Dr. Hardin Negus

## 2015-01-08 DIAGNOSIS — G894 Chronic pain syndrome: Secondary | ICD-10-CM | POA: Diagnosis not present

## 2015-01-08 DIAGNOSIS — M461 Sacroiliitis, not elsewhere classified: Secondary | ICD-10-CM | POA: Diagnosis not present

## 2015-01-08 DIAGNOSIS — M4726 Other spondylosis with radiculopathy, lumbar region: Secondary | ICD-10-CM | POA: Diagnosis not present

## 2015-01-08 DIAGNOSIS — Z79891 Long term (current) use of opiate analgesic: Secondary | ICD-10-CM | POA: Diagnosis not present

## 2015-01-08 NOTE — Telephone Encounter (Signed)
Pt called back and she went to Dr. Hardin Negus today and since seeing him she decided she no longer wants a referral to Ortho., please cancel the referral

## 2015-01-08 NOTE — Telephone Encounter (Signed)
I went to order review and the orthopedic referral was not present to cancel, was it already canceled?  Let me know what to do ,thanks

## 2015-01-09 NOTE — Telephone Encounter (Signed)
I cancelled the Ortho Referral.

## 2015-01-22 ENCOUNTER — Telehealth: Payer: Self-pay | Admitting: Hematology and Oncology

## 2015-01-22 NOTE — Telephone Encounter (Signed)
Called pt and attempted to schedule new pt appt.  Patient stated she had something wrong with her leg and asked to be called back at the end of the moth regarding this referral.

## 2015-02-12 ENCOUNTER — Telehealth: Payer: Self-pay

## 2015-02-12 DIAGNOSIS — E039 Hypothyroidism, unspecified: Secondary | ICD-10-CM

## 2015-02-12 NOTE — Telephone Encounter (Signed)
Pt left v/m; the doctor pt was referred to, Dr Estanislado Pandy thinks the prednisone may be affecting the Armour thyroid med. Pt request lab test to be checked next week at [facility] lab to ck this out. Pt request cb.

## 2015-02-12 NOTE — Telephone Encounter (Signed)
I ordered a future lab for a thyroid profile

## 2015-02-13 NOTE — Telephone Encounter (Signed)
appt scheduled

## 2015-02-16 ENCOUNTER — Other Ambulatory Visit: Payer: Self-pay | Admitting: Family Medicine

## 2015-02-18 ENCOUNTER — Telehealth: Payer: Self-pay

## 2015-02-18 NOTE — Telephone Encounter (Signed)
Pt said watery diarrhea started on 02/17/15 and has gone 12 x since yesterday; pt taking kaopectate with no relief.  No abd pain,fever,N or V.pt said cannot come in for appt due to not being able to be away from bathroom. [facility] [address]pt request cb.

## 2015-02-18 NOTE — Telephone Encounter (Signed)
The lack of other symptoms is reassuring - and this could be viral - if so will likely be brief  Please check to make sure she is not currently on any antibiotics She can try over the counter immodium - to see if this helps but stop if it causes abd pain  F/u if no improvement  Try to keep hydrated as well

## 2015-02-18 NOTE — Telephone Encounter (Signed)
Pt advise of Dr. Marliss Coots instructions/recommendations and verbalized understanding

## 2015-02-19 ENCOUNTER — Other Ambulatory Visit (INDEPENDENT_AMBULATORY_CARE_PROVIDER_SITE_OTHER): Payer: 59

## 2015-02-19 DIAGNOSIS — E039 Hypothyroidism, unspecified: Secondary | ICD-10-CM

## 2015-02-19 LAB — T4, FREE: Free T4: 0.46 ng/dL — ABNORMAL LOW (ref 0.60–1.60)

## 2015-02-19 LAB — TSH: TSH: 19.8 u[IU]/mL — ABNORMAL HIGH (ref 0.35–4.50)

## 2015-02-19 LAB — T3, FREE: T3, Free: 2.6 pg/mL (ref 2.3–4.2)

## 2015-02-25 ENCOUNTER — Telehealth: Payer: Self-pay | Admitting: Family Medicine

## 2015-02-25 MED ORDER — LEVOTHYROXINE SODIUM 50 MCG PO TABS: 50.0000 ug | ORAL_TABLET | Freq: Every day | ORAL | Status: DC

## 2015-02-25 NOTE — Telephone Encounter (Signed)
-----   Message from Tammi Sou, [place] sent at 02/25/2015 12:44 PM EDT ----- Pt notified of lab results and Dr. Marliss Coots comments/recommendations. Pt agrees to try to synthroid, [facility] [address]

## 2015-02-25 NOTE — Telephone Encounter (Signed)
I will send it  Please check tsh and free T4 in 4 weeks  Hold the armor thyroid and start synthroid generic -will adjust dose as we go

## 2015-02-26 NOTE — Telephone Encounter (Signed)
Pt notified Rx sent to pharmacy and to hold armor thyroid med while on synthroid. Pt declined to schedule appt. now because she has to check her schedule 1st, so she will call back later and schedule appt

## 2015-03-13 ENCOUNTER — Other Ambulatory Visit: Payer: Self-pay | Admitting: Family Medicine

## 2015-03-13 NOTE — Telephone Encounter (Signed)
Electronic refill request, last refilled on 02/17/15 #30 with 0 additional refills, please advise

## 2015-03-13 NOTE — Telephone Encounter (Signed)
Please refill times 3 

## 2015-03-14 NOTE — Telephone Encounter (Signed)
done

## 2015-03-17 ENCOUNTER — Telehealth: Payer: Self-pay

## 2015-03-17 ENCOUNTER — Other Ambulatory Visit: Payer: Self-pay | Admitting: *Deleted

## 2015-03-17 MED ORDER — THYROID 60 MG PO TABS: ORAL_TABLET | ORAL | Status: DC

## 2015-03-17 NOTE — Telephone Encounter (Signed)
Pt notified of Dr. Marliss Coots recommendations. Pt said she "is tired of all these doctors" and declined a referral to ENT, pt said if sxs worsen she might call back but for now she is okay

## 2015-03-17 NOTE — Telephone Encounter (Signed)
Ok keep me posted

## 2015-03-17 NOTE — Addendum Note (Signed)
Addended by: [name] A on: 03/17/2015 04:42 PM   Modules accepted: Orders, Medications

## 2015-03-17 NOTE — Telephone Encounter (Signed)
I do not have any more options for her dizziness - we may need to get her into ENT for further evaluation -ask please if she needs a referral (I know this is a recurrent problem)

## 2015-03-17 NOTE — Telephone Encounter (Signed)
Pt left v/m; dizziness is no better and meclizine is not helping dizziness. Pt has been dizzy for 3-4 days; but dizziness is continuous since 03/16/15.No CP or h/a. Pt does not want to schedule appt; pt does not feel like coming to office and request different med for [facility] to [facility] [address]. Pt request cb.

## 2015-03-17 NOTE — Telephone Encounter (Signed)
Pt notified of Dr. Marliss Coots instructions/ recommendations

## 2015-03-17 NOTE — Telephone Encounter (Signed)
I am just going to send in her armour thyroid - I know she just felt better with it (even though Dr Garen Grams was worried about prednisone interaction) There is not a good way to follow thyroid function with labs on armour thyroid but it seems like the only thing she can tolerate   I sent it to her pharmacy

## 2015-03-17 NOTE — Telephone Encounter (Signed)
Pt called back she said she is wanting her synthroid changed. Pt said her sxs didn't start until she switched from Armor Thyroid to Synthroid, pt request different med, please advise

## 2015-04-04 DIAGNOSIS — M25512 Pain in left shoulder: Secondary | ICD-10-CM | POA: Diagnosis not present

## 2015-04-04 DIAGNOSIS — M353 Polymyalgia rheumatica: Secondary | ICD-10-CM | POA: Diagnosis not present

## 2015-04-04 DIAGNOSIS — R42 Dizziness and giddiness: Secondary | ICD-10-CM | POA: Diagnosis not present

## 2015-04-04 DIAGNOSIS — M25511 Pain in right shoulder: Secondary | ICD-10-CM | POA: Diagnosis not present

## 2015-04-04 DIAGNOSIS — M8448XD Pathological fracture, other site, subsequent encounter for fracture with routine healing: Secondary | ICD-10-CM | POA: Diagnosis not present

## 2015-04-04 DIAGNOSIS — I10 Essential (primary) hypertension: Secondary | ICD-10-CM | POA: Diagnosis not present

## 2015-04-09 DIAGNOSIS — R42 Dizziness and giddiness: Secondary | ICD-10-CM | POA: Diagnosis not present

## 2015-04-09 DIAGNOSIS — I10 Essential (primary) hypertension: Secondary | ICD-10-CM | POA: Diagnosis not present

## 2015-04-09 DIAGNOSIS — M25512 Pain in left shoulder: Secondary | ICD-10-CM | POA: Diagnosis not present

## 2015-04-09 DIAGNOSIS — M353 Polymyalgia rheumatica: Secondary | ICD-10-CM | POA: Diagnosis not present

## 2015-04-09 DIAGNOSIS — M25511 Pain in right shoulder: Secondary | ICD-10-CM | POA: Diagnosis not present

## 2015-04-09 DIAGNOSIS — M8448XD Pathological fracture, other site, subsequent encounter for fracture with routine healing: Secondary | ICD-10-CM | POA: Diagnosis not present

## 2015-04-18 DIAGNOSIS — R42 Dizziness and giddiness: Secondary | ICD-10-CM | POA: Diagnosis not present

## 2015-04-18 DIAGNOSIS — M25512 Pain in left shoulder: Secondary | ICD-10-CM | POA: Diagnosis not present

## 2015-04-18 DIAGNOSIS — M25511 Pain in right shoulder: Secondary | ICD-10-CM | POA: Diagnosis not present

## 2015-04-18 DIAGNOSIS — M8448XD Pathological fracture, other site, subsequent encounter for fracture with routine healing: Secondary | ICD-10-CM | POA: Diagnosis not present

## 2015-04-18 DIAGNOSIS — I10 Essential (primary) hypertension: Secondary | ICD-10-CM | POA: Diagnosis not present

## 2015-04-18 DIAGNOSIS — M353 Polymyalgia rheumatica: Secondary | ICD-10-CM | POA: Diagnosis not present

## 2015-04-25 DIAGNOSIS — M25511 Pain in right shoulder: Secondary | ICD-10-CM | POA: Diagnosis not present

## 2015-04-25 DIAGNOSIS — M353 Polymyalgia rheumatica: Secondary | ICD-10-CM | POA: Diagnosis not present

## 2015-04-25 DIAGNOSIS — I10 Essential (primary) hypertension: Secondary | ICD-10-CM | POA: Diagnosis not present

## 2015-04-25 DIAGNOSIS — M8448XD Pathological fracture, other site, subsequent encounter for fracture with routine healing: Secondary | ICD-10-CM | POA: Diagnosis not present

## 2015-04-25 DIAGNOSIS — R42 Dizziness and giddiness: Secondary | ICD-10-CM | POA: Diagnosis not present

## 2015-04-25 DIAGNOSIS — M25512 Pain in left shoulder: Secondary | ICD-10-CM | POA: Diagnosis not present

## 2015-04-29 ENCOUNTER — Ambulatory Visit (INDEPENDENT_AMBULATORY_CARE_PROVIDER_SITE_OTHER): Payer: 59 | Admitting: Cardiology

## 2015-04-29 ENCOUNTER — Encounter: Payer: Self-pay | Admitting: Cardiology

## 2015-04-29 VITALS — BP 126/84 | HR 72 | Ht 67.0 in

## 2015-04-29 DIAGNOSIS — I10 Essential (primary) hypertension: Secondary | ICD-10-CM

## 2015-04-29 DIAGNOSIS — M353 Polymyalgia rheumatica: Secondary | ICD-10-CM | POA: Diagnosis not present

## 2015-04-29 DIAGNOSIS — I471 Supraventricular tachycardia: Secondary | ICD-10-CM

## 2015-04-29 NOTE — Progress Notes (Signed)
[name] Date of Birth: [birth year]   History of Present Illness: [name] is seen today for followup SVT. She has a history of SVT and PVCs. She is on chronic metoprolol therapy. She has been diagnosed with PMR and is now on chronic steroids.  She complains of swelling in hands and feet. Notes recent hearing loss. She is doing very well from a cardiac standpoint. She denies any symptoms of chest pain, palpitations, or shortness of breath.   Current Outpatient Prescriptions on File Prior to Visit  Medication Sig Dispense Refill  . meclizine (ANTIVERT) 25 MG tablet TAKE 1 TABLET (25 MG TOTAL) BY MOUTH 3 (THREE) TIMES DAILY AS NEEDED FOR DIZZINESS. 30 tablet 2  . metoprolol tartrate (LOPRESSOR) 25 MG tablet TAKE 1 TABLET (25 MG TOTAL) BY MOUTH 2 (TWO) TIMES DAILY. 180 tablet 1  . pantoprazole (PROTONIX) 40 MG tablet TAKE 1 TABLET (40 MG TOTAL) BY MOUTH DAILY. 90 tablet 3  . predniSONE (DELTASONE) 20 MG tablet Take 1 tablet (20 mg total) by mouth daily with breakfast. 30 tablet 1  . sertraline (ZOLOFT) 50 MG tablet TAKE 1 TABLET BY MOUTH EVERY DAY 90 tablet 3  . spironolactone (ALDACTONE) 25 MG tablet TAKE 1 TABLET (25 MG TOTAL) BY MOUTH DAILY. 90 tablet 3  . thyroid (ARMOUR THYROID) 60 MG tablet TAKE 1 TABLET (60 MG TOTAL) BY MOUTH DAILY BEFORE BREAKFAST. 90 tablet 2  . Wheat Dextrin (BENEFIBER PO) Take by mouth 2 (two) times daily.     No current facility-administered medications on file prior to visit.    Allergies  Allergen Reactions  . Amoxicillin-Pot Clavulanate     REACTION: diarrhea  . Aspirin Nausea And Vomiting  . Atorvastatin     REACTION: feel weak  . Carisoprodol     REACTION: intolerant  . Codeine     REACTION: pt. reports allergy to codeine, reaction not known  . Codeine Phosphate     REACTION: UNKNOWN  . Cortisone     REACTION: pt. reports allergy, reaction not known  . Ezetimibe-Simvastatin     REACTION: feels weak  . Levothyroxine Sodium     REACTION:  reaction not known  . Naproxen Sodium     REACTION: GI symptoms  . Omeprazole     REACTION: abd pain  . Quinapril Hcl     REACTION: reports allergy, reaction not known  . Rofecoxib     REACTION: reports allergy, reaction not known    Past Medical History  Diagnosis Date  . Hypertension   . Hyperlipidemia   . PVC's (premature ventricular contractions)   . Tobacco dependence   . IBS (irritable bowel syndrome)   . Diverticulitis   . Palpitations   . Chronic pain     Managed by Dr. Hardin Negus  . SVT (supraventricular tachycardia)   . Polymyalgia rheumatica     Past Surgical History  Procedure Laterality Date  . Tonsillectomy    . Nasal sinus surgery    . Wrist fracture surgery    . Replacement total knee      History  Smoking status  . Former Smoker -- 0.50 packs/day  . Quit date: 12/12/2011  Smokeless tobacco  . Never Used    History  Alcohol Use No    Family History  Problem Relation Age of Onset  . Diabetes Mother   . Heart attack Father   . Heart disease Sister     Review of Systems: The review of systems is as  above.   All other systems were reviewed and are negative.  Physical Exam: BP 126/84 mmHg  Pulse 72  Ht 5\' 7"  (1.702 m)  Wt  Patient is in no acute distress.  She is obese. Skin is warm and dry. Color is normal.  HEENT is unremarkable. Ear canals show minimal wax. Hearing is quite impaired.  Normocephalic/atraumatic. PERRL. Sclera are nonicteric. Neck is supple. No masses. No JVD. Lungs are clear. Cardiac exam shows a regular rate and rhythm.  Abdomen is soft and obese. Extremities are without edema. She walks with a cane.  No gross neurologic deficits noted.  LABORATORY DATA:   Lab Results  Component Value Date   WBC 8.6 04/17/2014   HGB 13.4 04/17/2014   HCT 40.9 04/17/2014   PLT 198.0 04/17/2014   GLUCOSE 103* 04/17/2014   CHOL 190 04/17/2014   TRIG 124.0 04/17/2014   HDL 37.60* 04/17/2014   LDLDIRECT 136.4 05/24/2012   LDLCALC 128*  04/17/2014   ALT 16 04/17/2014   AST 26 04/17/2014   NA 138 04/17/2014   K 4.5 04/17/2014   CL 103 04/17/2014   CREATININE 1.1 04/17/2014   BUN 19 04/17/2014   CO2 26 04/17/2014   TSH 19.80* 02/19/2015   INR 1.00 12/10/2010      Assessment / Plan: 1. Supraventricular tachycardia. Well controlled on her current dose of metoprolol. We will continue same dose and followup again in 6 months.  2. PVCs. Asymptomatic  3. Hypertension, controlled.  4. History of tobacco abuse now quit.  5. Hypothyroidism  6. PMR. On steroids.  7. Hearing loss. Recommend follow up with ENT.

## 2015-04-29 NOTE — Patient Instructions (Signed)
Continue your current therapy  I will see you in 6 months.   

## 2015-05-02 ENCOUNTER — Telehealth: Payer: Self-pay

## 2015-05-02 ENCOUNTER — Telehealth: Payer: Self-pay | Admitting: Family Medicine

## 2015-05-02 NOTE — Telephone Encounter (Signed)
[facility] [employer] faxed FMLA paperwork for [name] had to leave work today to take care of [name] because she fell.  He needs intermittent FMLA to care for [name]  In Dr Alba Cory IN BOX  For review and signature

## 2015-05-02 NOTE — Telephone Encounter (Signed)
FMLA paperwork is for pt's husband not pt (she is retired). She isn't sure if he will need [place] to fill out paperwork or not. Pt's spouse was out of work for 2 hrs. when pt fell he got off and checked on her and went back to work, and he has since made those 2 hrs up at work. Pt was just trying to give [place] a heads-up that we may get forms from his job but she isn't sure.  Pt said she was about to lay on her bed and sat on a silky pillow and slid off of the bed and fell on the floor. She wasn't injured and didn't hit her head she just needed help getting back up. Pt said she isn't in pain and she feels fine, I advise pt if her husband has to have [place] fill out paperwork to have him drop it off and we will review it then. Pt verbalized understanding

## 2015-05-02 NOTE — Telephone Encounter (Signed)
FMLA done for her husband and in IN box

## 2015-05-02 NOTE — Telephone Encounter (Signed)
Done and in IN box 

## 2015-05-02 NOTE — Telephone Encounter (Signed)
Pt fell earlier this morning and pt was not able to get up on her own; rescue workers got pt up and vital signs were OK and no known injury; pt is fine now with no complaints; Mr Leonhart had to leave work and is going to have FMLA paperwork sent to Dr Glori Bickers so leaving work will not count against him. FYI to Dr Glori Bickers.

## 2015-05-02 NOTE — Telephone Encounter (Signed)
Please ask what caused her to fall - ? Trip and fall or dizziness /pain or other cause  Also how is she feeling now and why is she leaving work (? Pain after fall or head injury?) Thanks -I need this info for paperwork or to determine if she needs a visit  Thanks

## 2015-05-05 NOTE — Telephone Encounter (Signed)
Forms given to Robin 

## 2015-05-09 DIAGNOSIS — R42 Dizziness and giddiness: Secondary | ICD-10-CM | POA: Diagnosis not present

## 2015-05-09 DIAGNOSIS — H6123 Impacted cerumen, bilateral: Secondary | ICD-10-CM | POA: Diagnosis not present

## 2015-05-09 DIAGNOSIS — H9193 Unspecified hearing loss, bilateral: Secondary | ICD-10-CM | POA: Diagnosis not present

## 2015-05-09 NOTE — Telephone Encounter (Signed)
Left message askingJohn pt spouse to call office  Please let him fmla has been faxed It was faxed 9/12 Copy for pt Copy for scan Copy for file

## 2015-05-14 ENCOUNTER — Other Ambulatory Visit: Payer: Self-pay | Admitting: Family Medicine

## 2015-05-19 ENCOUNTER — Telehealth: Payer: Self-pay | Admitting: *Deleted

## 2015-05-19 NOTE — Telephone Encounter (Signed)
Done in IN box 

## 2015-05-19 NOTE — Telephone Encounter (Signed)
[employer] called stating that they received paperwork for patient's spouse regarding FMLA. [employer] stated that questions 6 and 7 do not match and they have to. [employer]igm[employer] Daniel stated that she is faxing the paperwork back over to be corrected and faxed back.

## 2015-05-20 NOTE — Telephone Encounter (Signed)
FMLA forms given to [facility]

## 2015-05-21 NOTE — Telephone Encounter (Signed)
Please close encounter if completed

## 2015-05-22 NOTE — Telephone Encounter (Signed)
Faxed paperwork Butch Penny @ [employer] has received this

## 2015-06-02 ENCOUNTER — Telehealth: Payer: Self-pay | Admitting: Hematology

## 2015-06-02 NOTE — Telephone Encounter (Signed)
New patient appt-S/w patient and gave np appt for 11/08 @ 1:30 w/Dr. Irene Limbo Referring Dr. Estanislado Pandy Dx-Abnl SPEP/IFE  Referral information scanned.  Patient was initially referred back in June patient call on today to schedule appt.

## 2015-07-01 ENCOUNTER — Ambulatory Visit (HOSPITAL_BASED_OUTPATIENT_CLINIC_OR_DEPARTMENT_OTHER): Payer: 59 | Admitting: Hematology

## 2015-07-01 ENCOUNTER — Telehealth: Payer: Self-pay | Admitting: Hematology

## 2015-07-01 ENCOUNTER — Ambulatory Visit (HOSPITAL_BASED_OUTPATIENT_CLINIC_OR_DEPARTMENT_OTHER): Payer: 59

## 2015-07-01 ENCOUNTER — Encounter: Payer: Self-pay | Admitting: Hematology

## 2015-07-01 VITALS — BP 156/71 | HR 55 | Temp 97.7°F | Resp 18 | Ht 67.0 in | Wt 223.5 lb

## 2015-07-01 DIAGNOSIS — D472 Monoclonal gammopathy: Secondary | ICD-10-CM

## 2015-07-01 DIAGNOSIS — M353 Polymyalgia rheumatica: Secondary | ICD-10-CM | POA: Diagnosis not present

## 2015-07-01 DIAGNOSIS — H61893 Other specified disorders of external ear, bilateral: Secondary | ICD-10-CM | POA: Diagnosis not present

## 2015-07-01 LAB — COMPREHENSIVE METABOLIC PANEL (CC13)
AST: 14 U/L (ref 5–34)
Albumin: 3.7 g/dL (ref 3.5–5.0)
Alkaline Phosphatase: 58 U/L (ref 40–150)
Anion Gap: 8 mEq/L (ref 3–11)
BILIRUBIN TOTAL: 0.8 mg/dL (ref 0.20–1.20)
BUN: 14.5 mg/dL (ref 7.0–26.0)
CHLORIDE: 104 meq/L (ref 98–109)
CO2: 30 meq/L — AB (ref 22–29)
CREATININE: 1.1 mg/dL (ref 0.6–1.1)
Calcium: 10 mg/dL (ref 8.4–10.4)
EGFR: 51 mL/min/{1.73_m2} — AB (ref >90)
GLUCOSE: 89 mg/dL (ref 70–140)
Potassium: 3.9 mEq/L (ref 3.5–5.1)
SODIUM: 142 meq/L (ref 136–145)
TOTAL PROTEIN: 7.1 g/dL (ref 6.4–8.3)

## 2015-07-01 LAB — CBC & DIFF AND RETIC
BASO%: 0.2 % (ref 0.0–2.0)
Basophils Absolute: 0 10*3/uL (ref 0.0–0.1)
EOS%: 1.3 % (ref 0.0–7.0)
Eosinophils Absolute: 0.1 10*3/uL (ref 0.0–0.5)
HCT: 43 % (ref 34.8–46.6)
HGB: 13.6 g/dL (ref 11.6–15.9)
IMMATURE RETIC FRACT: 3.2 % (ref 1.60–10.00)
LYMPH#: 3.8 10*3/uL — AB (ref 0.9–3.3)
LYMPH%: 46 % (ref 14.0–49.7)
MCH: 28.8 pg (ref 25.1–34.0)
MCHC: 31.6 g/dL (ref 31.5–36.0)
MCV: 90.9 fL (ref 79.5–101.0)
MONO#: 0.7 10*3/uL (ref 0.1–0.9)
MONO%: 8.1 % (ref 0.0–14.0)
NEUT%: 44.4 % (ref 38.4–76.8)
NEUTROS ABS: 3.7 10*3/uL (ref 1.5–6.5)
Platelets: 193 10*3/uL (ref 145–400)
RBC: 4.73 10*6/uL (ref 3.70–5.45)
RDW: 15.4 % — AB (ref 11.2–14.5)
RETIC CT ABS: 55.34 10*3/uL (ref 33.70–90.70)
Retic %: 1.17 % (ref 0.70–2.10)
WBC: 8.3 10*3/uL (ref 3.9–10.3)

## 2015-07-01 LAB — LACTATE DEHYDROGENASE (CC13): LDH: 200 U/L (ref 125–245)

## 2015-07-01 NOTE — Telephone Encounter (Signed)
Gave and printed appt sched for NOV °

## 2015-07-01 NOTE — Progress Notes (Signed)
[name]    HEMATOLOGY/ONCOLOGY CONSULTATION NOTE  Date of Service: 07/01/2015  Patient Care Team: Abner Greenspan, MD as PCP - General  CHIEF COMPLAINTS/PURPOSE OF CONSULTATION:    HISTORY OF PRESENTING ILLNESS:  [name] is a wonderful 79 y.o. female who has been referred to [place] by Dr .[name], MD and Dr Bo Merino for evaluation and management of newly noted monoclonal gammopathy of undetermined significance.  Patient has a history of polymyalgia rheumatica and osteoarthritis. She has been having arthralgias predominantly around her shoulder girdle and hip area. Has been on low-dose prednisone for management of PMR. As a part of her workup for other autoimmune conditions and other causes of bone pains she had an SPEP with [facility] that showed a small amount of monoclonal IgA kappa monoclonal protein. She also has had vitamin D deficiency and has been on supplementation for that.  She was given a referral to [place] for further evaluation of her monoclonal paraproteinemia. Her labs today showed no evidence of anemia or other cytopenias. CMP shows no evidence of significant renal failure. No hypercalcemia. No alkaline phosphatase elevation. No overtly new focal bone pains. No acute weight loss, fevers or chills or night sweats. Has continued to have arthralgias predominantly in her shoulders. Some lower back pain that has been chronic. Note some easy skin bruisability but no other overt bleeding.  MEDICAL HISTORY:  Past Medical History  Diagnosis Date  . Hypertension   . Hyperlipidemia   . PVC's (premature ventricular contractions)   . Tobacco dependence   . IBS (irritable bowel syndrome)   . Diverticulitis   . Palpitations   . Chronic pain     Managed by Dr. Hardin Negus  . SVT (supraventricular tachycardia)   . Polymyalgia rheumatica     SURGICAL HISTORY: Past Surgical History  Procedure Laterality Date  . Tonsillectomy    . Nasal sinus surgery    . Wrist fracture surgery     . Replacement total knee      SOCIAL HISTORY: Social History   Social History  . Marital Status: Married    Spouse Name: N/A  . Number of Children: 2  . Years of Education: N/A   Occupational History  .     Social History Main Topics  . Smoking status: Former Smoker -- 0.50 packs/day    Quit date: 12/12/2011  . Smokeless tobacco: Never Used  . Alcohol Use: No  . Drug Use: No  . Sexual Activity: Not on file   Other Topics Concern  . Not on file   Social History Narrative    FAMILY HISTORY: Family History  Problem Relation Age of Onset  . Diabetes Mother   . Heart attack Father   . Heart disease Sister     ALLERGIES:  is allergic to amoxicillin-pot clavulanate; aspirin; atorvastatin; carisoprodol; codeine; codeine phosphate; cortisone; ezetimibe-simvastatin; levothyroxine sodium; naproxen sodium; omeprazole; quinapril hcl; and rofecoxib.  MEDICATIONS:  Current Outpatient Prescriptions  Medication Sig Dispense Refill  . meclizine (ANTIVERT) 25 MG tablet TAKE 1 TABLET (25 MG TOTAL) BY MOUTH 3 (THREE) TIMES DAILY AS NEEDED FOR DIZZINESS. 30 tablet 2  . metoprolol tartrate (LOPRESSOR) 25 MG tablet TAKE 1 TABLET (25 MG TOTAL) BY MOUTH 2 (TWO) TIMES DAILY. 180 tablet 1  . morphine (MS CONTIN) 60 MG 12 hr tablet Take 60 mg by mouth every 12 (twelve) hours.  0  . pantoprazole (PROTONIX) 40 MG tablet TAKE 1 TABLET (40 MG TOTAL) BY MOUTH DAILY. 90 tablet 3  .  predniSONE (DELTASONE) 20 MG tablet Take 1 tablet (20 mg total) by mouth daily with breakfast. 30 tablet 1  . sertraline (ZOLOFT) 50 MG tablet TAKE 1 TABLET BY MOUTH EVERY DAY 90 tablet 3  . spironolactone (ALDACTONE) 25 MG tablet TAKE 1 TABLET (25 MG TOTAL) BY MOUTH DAILY. 90 tablet 3  . thyroid (ARMOUR THYROID) 60 MG tablet TAKE 1 TABLET (60 MG TOTAL) BY MOUTH DAILY BEFORE BREAKFAST. 90 tablet 2  . Wheat Dextrin (BENEFIBER PO) Take by mouth 2 (two) times daily.     No current facility-administered medications for this  visit.    REVIEW OF SYSTEMS:    10 Point review of Systems was done is negative except as noted above.  PHYSICAL EXAMINATION: ECOG PERFORMANCE STATUS: 2 - Symptomatic, <50% confined to bed  . Filed Vitals:   07/01/15 1403  BP: 156/71  Pulse: 55  Temp: 97.7 F (36.5 C)  Resp: 18   Filed Weights   07/01/15 1403  Weight: 223 lb 8 oz (101.379 kg)   .Body mass index is 35 kg/(m^2).  GENERAL:alert, in no acute distress and comfortable SKIN: skin color, texture, turgor are normal, no rashes or significant lesions EYES: normal, conjunctiva are pink and non-injected, sclera clear OROPHARYNX:no exudate, no erythema and lips, buccal mucosa, and tongue normal  NECK: supple, no JVD, thyroid normal size, non-tender, without nodularity LYMPH:  no palpable lymphadenopathy in the cervical, axillary or inguinal LUNGS: clear to auscultation with normal respiratory effort HEART: regular rate & rhythm,  no murmurs and no lower extremity edema ABDOMEN: abdomen obese, soft, non-tender, normoactive bowel sounds  Musculoskeletal: no cyanosis of digits and no clubbing  PSYCH: alert & oriented x 3 with fluent speech NEURO: no focal motor/sensory deficits  LABORATORY DATA:  I have reviewed the data as listed  . CBC Latest Ref Rng 04/17/2014 03/06/2013 05/24/2012  WBC 4.0 - 10.5 K/uL 8.6 7.9 6.6  Hemoglobin 12.0 - 15.0 g/dL 13.4 13.2 12.5  Hematocrit 36.0 - 46.0 % 40.9 40.4 38.0  Platelets 150.0 - 400.0 K/uL 198.0 248.0 203.0    . CMP Latest Ref Rng 04/17/2014 03/06/2013 05/24/2012  Glucose 70 - 99 mg/dL 103(H) 101(H) 97  BUN 6 - 23 mg/dL _0 Creatinine 0.4 - 1.2 mg/dL 1.1 1.0 0.9  Sodium 135 - 145 mEq/L 138 138 139  Potassium 3.5 - 5.1 mEq/L 4.5 4.7 3.9  Chloride 96 - 112 mEq/L 103 105 105  CO2 19 - 32 mEq/L _1 Calcium 8.4 - 10.5 mg/dL 9.5 9.4 8.9  Total Protein 6.0 - 8.3 g/dL 8.2 7.7 7.0  Total Bilirubin 0.2 - 1.2 mg/dL 0.6 0.4 0.4  Alkaline Phos 39 - 117 U/L 65 63 66  AST 0  - 37 U/L _2 ALT 0 - 35 U/L _3 . Lab Results  Component Value Date   TOTALPROTELP 7.4 07/01/2015   ALBUMINELP 4.1 07/01/2015   A1GS 0.3 07/01/2015   A2GS 0.8 07/01/2015   BETS 0.5 07/01/2015   BETA2SER 0.5 07/01/2015   GAMS 1.2 07/01/2015   SPEI * 07/01/2015   Immunofix Electr Int  *   Comments: Monoclonal IgA kappa protein is present.Area of slightly restricted mobility in the IgG and Kappa lanes.Suggest repeat in 6-8 months, if clinically indicated. Reviewed by Odis Hollingshead, MD, PhD, FCAP (Electronic Signature onFile)        Comments: A restricted band consistent with monoclonal protein is present.The monoclonal protein peak  accounts for 0.2 g/dL of the total0.5 g/dL of protein in the beta-2 region. Results are consistent with SPE performed on 12/09/14. Reviewed by Odis Hollingshead,  MD, PhD, FCAP (Electronic Signature onFile)   Lab Results  Component Value Date   KPAFRELGTCHN 24.80* 07/01/2015   LAMBDASER 1.06 07/01/2015   KAPLAMBRATIO 23.40* 07/01/2015   (kappa/lambda light chains)      Component     Latest Ref Rng 07/01/2015 07/14/2015 07/14/2015         12:27 PM 12:27 PM  Total Volume, Urine        500  Collection Interval        24  Total Protein, Urine        13  Total Protein, Urine/Day     50 - 100 mg/day   65  Albumin        Not Applicable  [id number]-[id number], U        Not Applicable  ZJQBH-4-LPFXTKWI, U        Not Applicable  Beta Globulin, U        Not Applicable  Gamma Globulin, U        Not Applicable  Monoclonal Band 1        NONE DET  Monoclonal Band 2        NONE DET  Interpretation       * *  Volume, Urine-UPE24       500   Time-UPE24       24   Total Protein, Urine-UPE24     5 - 24 mg/dL  13   ALBUMIN, U     DETECTED  DETECTED   Alpha 1, Urine     NONE DET  DETECTED (A)   Alpha 2, Urine     NONE DET  DETECTED (A)   Beta, Urine     NONE DET  DETECTED (A)   Gamma Globulin, Urine     NONE DET  DETECTED  (A)   Urine Volume       500   Measured Kappa Chain     <2.00 mg/dL  1.16   Total Kappa Chain       5.80   Measured Lambda Chain     <2.00 mg/dL  <0.40   Total Lambda Chain       REPORT   Sed Rate     0 - 30 mm/hr 4    LDH     125 - 245 U/L 200    Beta-2 Microglobulin     <=2.51 mg/L 3.74 (H)     RADIOGRAPHIC STUDIES: I have personally reviewed the radiological images as listed and agreed with the findings in the report. Nm Pet Image Initial (pi) Whole Body  07/14/2015  CLINICAL DATA:  Initial treatment strategy for multiple myeloma EXAM: NUCLEAR MEDICINE PET WHOLE BODY TECHNIQUE: 11.9 mCi F-18 FDG was injected intravenously. Full-ring PET imaging was performed from the vertex to the feet after the radiotracer. CT data was obtained and used for attenuation correction and anatomic localization. FASTING BLOOD GLUCOSE:  Value:  80 for mg/dl COMPARISON:  None. FINDINGS: Head/Neck: No hypermetabolic lymph nodes in the neck. Chest: No hypermetabolic mediastinal or hilar nodes. No suspicious pulmonary nodules on the CT scan. Abdomen/Pelvis: Small focus of hypermetabolism is identified in the liver, adjacent to the gallbladder fossa with SUV max = 7.9. No underlying liver lesion is evident. There is a focus of soft tissue attenuation in the gallbladder fundus at this level. No abnormal  hypermetabolic activity within the pancreas, adrenal glands, or spleen. No hypermetabolic lymph nodes in the abdomen or pelvis. Skeleton: No focal hypermetabolic activity. Some bone marrow uptake is somewhat mottled but no underlying lytic lesions are seen in the spine. There is mild uptake in the sacrum bilaterally, right greater than left, and bone windows suggest underlying sacral insufficiency fracture. Mild uptake in both shoulders is compatible with the underlying degenerative change. Extremities: No hypermetabolic activity to suggest metastasis. IMPRESSION: 1. No focal hypermetabolic bony uptake. 2. Small focus of  hypermetabolism identified in the liver adjacent to or in the gallbladder. There is a small soft tissue lesion or on calcified stone in the gallbladder near this uptake. Abdominal MRI without and with contrast would be helpful to exclude underlying hypermetabolic liver lesion. 3. Probable sacral insufficiency fracture. Electronically Signed   By: Misty Stanley M.D.   On: 07/14/2015 15:25    ASSESSMENT & PLAN:   79 year old Caucasian female with  #1 IgA kappa monoclonal gammopathy of undetermined significance. M protein spike of 0.2. PET/CT scan shows no evidence of hypermetabolic bone lesions. No anemia, no renal failure no hypercalcemia. Significantly altered kappa lambda light chain ratio along with non-IgG MGUS suggesting a higher risk of progression to a more concerning clonal plasma cell dyscrasia. We did a microglobulin is somewhat elevated at 3.74. #2 Incidentally noted bilateral sacral insufficiency fractures right greater than left. Plan -Patient will be seen in follow-up in a couple of weeks to discuss the results of these findings and to determine whether she would want to pursue with the bone marrow biopsy and aspiration for a more definitive evaluation versus opting for a close monitoring approach. -Would certainly monitor her closely every 3 months or so for increasing M spike and new symptoms with low threshold to do a bone marrow biopsy given her high risk non-IgG protein and significantly skewed Kappa-lambda light chain ratios  #3 Incidentally noted small focus of hypermetabolism in the liver adjacent to the gallbladder with gallbladder showing a small soft tissue lesion/calcified stone. Plan -Consider ultrasound of the abdomen or MRI with primary care physician to rule out incidentally noted gallbladder pathology. Patient has no significant abdominal pain at this time.  #4 osteoarthritis and polymyalgia rheumatica -Being managed by Dr. Estanislado Pandy from rheumatology.  . Patient  Active Problem List   Diagnosis Date Noted  . Acute joint pain 10/14/2014  . Right foot pain 08/05/2014  . Right shoulder pain 08/05/2014  . Microscopic hematuria 05/21/2014  . PMR (polymyalgia rheumatica) ([place]) 05/15/2014  . Elevated sed rate 04/30/2014  . UTI (urinary tract infection) 04/30/2014  . Other malaise and fatigue 04/17/2014  . Diverticulosis 04/17/2014  . Bilateral arm pain 04/17/2014  . Abdominal pain, LLQ 04/17/2014  . Former smoker 12/12/2012  . Muscle cramps 07/12/2012  . Depression 04/19/2011  . Pre-syncope 03/08/2011  . OBESITY 10/03/2007  . VERTIGO 04/11/2007  . Hypothyroidism 12/27/2006  . HYPERCHOLESTEROLEMIA 12/27/2006  . Essential hypertension 12/27/2006  . SVT (supraventricular tachycardia) ([place]) 12/27/2006  . ALLERGIC RHINITIS 12/27/2006  . COPD 12/27/2006  . OSTEOARTHRITIS 12/27/2006  . Chronic back pain 12/27/2006   -Continue follow-up for other management by primary care physician.  All of the patients questions were answered with apparent satisfaction. The patient knows to call the clinic with any problems, questions or concerns.  I spent 55 minutes counseling the patient face to face. The total time spent in the appointment was 60 minutes and more than 50% was on counseling and direct patient  cares.    Sullivan Lone MD [place] AAHIVMS [facility] Hematology/Oncology Physician [facility]  (Office):       [phone] (Work cell):  [phone] (Fax):           [phone]  07/01/2015 2:21 PM

## 2015-07-03 LAB — SEDIMENTATION RATE: SED RATE: 4 mm/h (ref 0–30)

## 2015-07-03 LAB — SPEP & IFE WITH QIG
Abnormal Protein Band1: 0.2 g/dL
Albumin ELP: 4.1 g/dL (ref 3.8–4.8)
Alpha-1-Globulin: 0.3 g/dL (ref 0.2–0.3)
Alpha-2-Globulin: 0.8 g/dL (ref 0.5–0.9)
BETA GLOBULIN: 0.5 g/dL (ref 0.4–0.6)
Beta 2: 0.5 g/dL (ref 0.2–0.5)
Gamma Globulin: 1.2 g/dL (ref 0.8–1.7)
IGA: 422 mg/dL — AB (ref 69–380)
IgG (Immunoglobin G), Serum: 1290 mg/dL (ref 690–1700)
IgM, Serum: 35 mg/dL — ABNORMAL LOW (ref 52–322)
Total Protein, Serum Electrophoresis: 7.4 g/dL (ref 6.1–8.1)

## 2015-07-03 LAB — BETA 2 MICROGLOBULIN, SERUM: BETA 2 MICROGLOBULIN: 3.74 mg/L — AB (ref <=2.51)

## 2015-07-03 LAB — KAPPA/LAMBDA LIGHT CHAINS
KAPPA FREE LGHT CHN: 24.8 mg/dL — AB (ref 0.33–1.94)
KAPPA LAMBDA RATIO: 23.4 — AB (ref 0.26–1.65)
LAMBDA FREE LGHT CHN: 1.06 mg/dL (ref 0.57–2.63)

## 2015-07-10 DIAGNOSIS — Z79891 Long term (current) use of opiate analgesic: Secondary | ICD-10-CM | POA: Diagnosis not present

## 2015-07-14 ENCOUNTER — Ambulatory Visit (HOSPITAL_COMMUNITY)
Admission: RE | Admit: 2015-07-14 | Discharge: 2015-07-14 | Disposition: A | Discharge status: Home / Self Care | Payer: 59 | Source: Ambulatory Visit | Attending: Hematology | Admitting: Hematology

## 2015-07-14 ENCOUNTER — Other Ambulatory Visit: Payer: Self-pay | Admitting: Hematology

## 2015-07-14 DIAGNOSIS — D472 Monoclonal gammopathy: Secondary | ICD-10-CM

## 2015-07-14 DIAGNOSIS — Z0189 Encounter for other specified special examinations: Secondary | ICD-10-CM | POA: Diagnosis not present

## 2015-07-14 DIAGNOSIS — R935 Abnormal findings on diagnostic imaging of other abdominal regions, including retroperitoneum: Secondary | ICD-10-CM | POA: Insufficient documentation

## 2015-07-14 DIAGNOSIS — C9000 Multiple myeloma not having achieved remission: Secondary | ICD-10-CM | POA: Diagnosis present

## 2015-07-14 DIAGNOSIS — K829 Disease of gallbladder, unspecified: Secondary | ICD-10-CM | POA: Diagnosis not present

## 2015-07-14 LAB — GLUCOSE, CAPILLARY: GLUCOSE-CAPILLARY: 84 mg/dL (ref 65–99)

## 2015-07-14 MED ORDER — FLUDEOXYGLUCOSE F - 18 (FDG) INJECTION
1245.0000 | Freq: Once | INTRAVENOUS | Status: DC | PRN
  Administered 2015-07-14: 11.9 via INTRAVENOUS
  Filled 2015-07-14: qty 1245

## 2015-07-19 LAB — 24 HR URINE,KAPPA/LAMBDA LIGHT CHAINS
24H Urine Volume: 500 mL/24 h
Measured Kappa Chain: 1.16 mg/dL (ref <2.00)
Total Kappa Chain: 5.8 mg/24 h

## 2015-07-19 LAB — UPEP/TP, 24-HR URINE
Collection Interval: 24 hours
TOTAL PROTEIN, URINE: 13 mg/dL
TOTAL VOLUME, URINE: 500 mL
Total Protein, Urine/Day: 65 mg/d (ref 50–100)

## 2015-07-19 LAB — UIFE/LIGHT CHAINS/TP QN, 24-HR UR
ALPHA 1 UR: DETECTED — AB
Albumin, U: DETECTED
Alpha 2, Urine: DETECTED — AB
Beta, Urine: DETECTED — AB
GAMMA UR: DETECTED — AB
TIME-UPE24: 24 h
Total Protein, Urine: 13 mg/dL (ref 5–24)
VOLUME, URINE-UPE24: 500 mL

## 2015-07-22 ENCOUNTER — Ambulatory Visit (HOSPITAL_BASED_OUTPATIENT_CLINIC_OR_DEPARTMENT_OTHER): Payer: 59

## 2015-07-22 ENCOUNTER — Ambulatory Visit (HOSPITAL_BASED_OUTPATIENT_CLINIC_OR_DEPARTMENT_OTHER): Payer: 59 | Admitting: Hematology

## 2015-07-22 ENCOUNTER — Encounter: Payer: Self-pay | Admitting: Hematology

## 2015-07-22 ENCOUNTER — Telehealth: Payer: Self-pay | Admitting: Hematology

## 2015-07-22 VITALS — BP 123/62 | HR 62 | Temp 98.0°F | Resp 18 | Ht 67.0 in | Wt 231.5 lb

## 2015-07-22 DIAGNOSIS — D472 Monoclonal gammopathy: Secondary | ICD-10-CM | POA: Diagnosis not present

## 2015-07-22 DIAGNOSIS — R932 Abnormal findings on diagnostic imaging of liver and biliary tract: Secondary | ICD-10-CM | POA: Diagnosis not present

## 2015-07-22 DIAGNOSIS — R300 Dysuria: Secondary | ICD-10-CM

## 2015-07-22 LAB — URINALYSIS, MICROSCOPIC - CHCC
Bilirubin (Urine): NEGATIVE
Blood: NEGATIVE
GLUCOSE UR CHCC: NEGATIVE mg/dL
Ketones: NEGATIVE mg/dL
LEUKOCYTE ESTERASE: NEGATIVE
NITRITE: NEGATIVE
PROTEIN: NEGATIVE mg/dL
SPECIFIC GRAVITY, URINE: 1.025 (ref 1.003–1.035)
UROBILINOGEN UR: 0.2 mg/dL (ref 0.2–1)
pH: 6 (ref 4.6–8.0)

## 2015-07-22 NOTE — Telephone Encounter (Signed)
Gave and printed appt sched and avs for pt for March 2017 °

## 2015-08-01 ENCOUNTER — Telehealth: Payer: Self-pay | Admitting: Hematology

## 2015-08-01 NOTE — Progress Notes (Signed)
[name]  HEMATOLOGY ONCOLOGY PROGRESS NOTE  Date of service: .07/22/2015  Patient Care Team: Lynn Greenspan, MD as PCP - General  Diagnosis:  IgA kappa MGUS  Current Treatment: observation  INTERVAL HISTORY:  [name] is here for follow-up to discuss her workup results. She notes some mild dysuria but no other acute new symptoms. A urinalysis was done that shows no overt evidence of urinary tract infection but was noted to have calcium oxalate crystals. She was counseled to drink more water and avoid sodas and several days later noted that her symptoms improved. We discussed the findings of her PET/CT scan and lab results and discussed about the pros and cons of getting a bone marrow aspiration and biopsy. We discussed that this would give [place] definitive information regarding the presence or absence of a specific plasma cell dyscrasia. Given that her M protein levels are low and she does not have any overt CRA B criteria for multiple myeloma she would like to pursue a close observation approach at this time.  REVIEW OF SYSTEMS:    10 Point review of systems of done and is negative except as noted above.  . Past Medical History  Diagnosis Date  . Hypertension   . Hyperlipidemia   . PVC's (premature ventricular contractions)   . Tobacco dependence   . IBS (irritable bowel syndrome)   . Diverticulitis   . Palpitations   . Chronic pain     Managed by Dr. Hardin Negus  . SVT (supraventricular tachycardia) ([place])   . Polymyalgia rheumatica ([place])     . Past Surgical History  Procedure Laterality Date  . Tonsillectomy    . Nasal sinus surgery    . Wrist fracture surgery    . Replacement total knee      . Social History  Substance Use Topics  . Smoking status: Former Smoker -- 0.50 packs/day    Quit date: 12/12/2011  . Smokeless tobacco: Never Used  . Alcohol Use: No    ALLERGIES:  is allergic to amoxicillin-pot clavulanate; aspirin; atorvastatin; carisoprodol; codeine;  codeine phosphate; cortisone; ezetimibe-simvastatin; levothyroxine sodium; naproxen sodium; omeprazole; quinapril hcl; and rofecoxib.  MEDICATIONS:  Current Outpatient Prescriptions  Medication Sig Dispense Refill  . meclizine (ANTIVERT) 25 MG tablet TAKE 1 TABLET (25 MG TOTAL) BY MOUTH 3 (THREE) TIMES DAILY AS NEEDED FOR DIZZINESS. 30 tablet 2  . metoprolol tartrate (LOPRESSOR) 25 MG tablet TAKE 1 TABLET (25 MG TOTAL) BY MOUTH 2 (TWO) TIMES DAILY. 180 tablet 1  . morphine (MS CONTIN) 60 MG 12 hr tablet Take 60 mg by mouth every 12 (twelve) hours.  0  . pantoprazole (PROTONIX) 40 MG tablet TAKE 1 TABLET (40 MG TOTAL) BY MOUTH DAILY. 90 tablet 3  . predniSONE (DELTASONE) 20 MG tablet Take 1 tablet (20 mg total) by mouth daily with breakfast. 30 tablet 1  . sertraline (ZOLOFT) 50 MG tablet TAKE 1 TABLET BY MOUTH EVERY DAY 90 tablet 3  . spironolactone (ALDACTONE) 25 MG tablet TAKE 1 TABLET (25 MG TOTAL) BY MOUTH DAILY. 90 tablet 3  . thyroid (ARMOUR THYROID) 60 MG tablet TAKE 1 TABLET (60 MG TOTAL) BY MOUTH DAILY BEFORE BREAKFAST. 90 tablet 2  . Wheat Dextrin (BENEFIBER PO) Take by mouth 2 (two) times daily.     No current facility-administered medications for this visit.    PHYSICAL EXAMINATION: ECOG PERFORMANCE STATUS: 2 - Symptomatic, <50% confined to bed  . Filed Vitals:   07/22/15 1054  BP: 123/62  Pulse: 62  Temp: 98 F (36.7 C)  Resp: 18    Filed Weights   07/22/15 1054  Weight: 231 lb 8 oz (105.008 kg)   .Body mass index is 36.25 kg/(m^2).  GENERAL:alert, in no acute distress and comfortable SKIN: skin color, texture, turgor are normal, no rashes or significant lesions EYES: normal, conjunctiva are pink and non-injected, sclera clear OROPHARYNX:no exudate, no erythema and lips, buccal mucosa, and tongue normal  NECK: supple, no JVD, thyroid normal size, non-tender, without nodularity LYMPH:  no palpable lymphadenopathy in the cervical, axillary or inguinal LUNGS:  clear to auscultation with normal respiratory effort HEART: regular rate & rhythm,  no murmurs and no lower extremity edema ABDOMEN: abdomen obese, soft, non-tender, normoactive bowel sounds  Musculoskeletal: no cyanosis of digits and no clubbing  PSYCH: alert & oriented x 3 with fluent speech NEURO: no focal motor/sensory deficits  LABORATORY DATA:   I have reviewed the data as listed  . CBC Latest Ref Rng 07/01/2015 04/17/2014 03/06/2013  WBC 3.9 - 10.3 10e3/uL 8.3 8.6 7.9  Hemoglobin 11.6 - 15.9 g/dL 13.6 13.4 13.2  Hematocrit 34.8 - 46.6 % 43.0 40.9 40.4  Platelets 145 - 400 10e3/uL 193 198.0 248.0    . CMP Latest Ref Rng 07/01/2015 04/17/2014 03/06/2013  Glucose 70 - 140 mg/dl 89 103(H) 101(H)  BUN 7.0 - 26.0 mg/dL 14.'5 19 11  ' Creatinine 0.6 - 1.1 mg/dL 1.1 1.1 1.0  Sodium 136 - 145 mEq/L 142 138 138  Potassium 3.5 - 5.1 mEq/L 3.9 4.5 4.7  Chloride 96 - 112 mEq/L - 103 105  CO2 22 - 29 mEq/L 30(H) 26 29  Calcium 8.4 - 10.4 mg/dL 10.0 9.5 9.4  Total Protein 6.4 - 8.3 g/dL 7.1 8.2 7.7  Total Bilirubin 0.20 - 1.20 mg/dL 0.80 0.6 0.4  Alkaline Phos 40 - 150 U/L 58 65 63  AST 5 - 34 U/L '14 26 23  ' ALT 0 - 55 U/L <'9 16 23     ' RADIOGRAPHIC STUDIES: I have personally reviewed the radiological images as listed and agreed with the findings in the report. Nm Pet Image Initial (pi) Whole Body  07/14/2015  CLINICAL DATA:  Initial treatment strategy for multiple myeloma EXAM: NUCLEAR MEDICINE PET WHOLE BODY TECHNIQUE: 11.9 mCi F-18 FDG was injected intravenously. Full-ring PET imaging was performed from the vertex to the feet after the radiotracer. CT data was obtained and used for attenuation correction and anatomic localization. FASTING BLOOD GLUCOSE:  Value:  80 for mg/dl COMPARISON:  None. FINDINGS: Head/Neck: No hypermetabolic lymph nodes in the neck. Chest: No hypermetabolic mediastinal or hilar nodes. No suspicious pulmonary nodules on the CT scan. Abdomen/Pelvis: Small focus of  hypermetabolism is identified in the liver, adjacent to the gallbladder fossa with SUV max = 7.9. No underlying liver lesion is evident. There is a focus of soft tissue attenuation in the gallbladder fundus at this level. No abnormal hypermetabolic activity within the pancreas, adrenal glands, or spleen. No hypermetabolic lymph nodes in the abdomen or pelvis. Skeleton: No focal hypermetabolic activity. Some bone marrow uptake is somewhat mottled but no underlying lytic lesions are seen in the spine. There is mild uptake in the sacrum bilaterally, right greater than left, and bone windows suggest underlying sacral insufficiency fracture. Mild uptake in both shoulders is compatible with the underlying degenerative change. Extremities: No hypermetabolic activity to suggest metastasis. IMPRESSION: 1. No focal hypermetabolic bony uptake. 2. Small focus of hypermetabolism identified in the liver adjacent to or in the gallbladder.  There is a small soft tissue lesion or on calcified stone in the gallbladder near this uptake. Abdominal MRI without and with contrast would be helpful to exclude underlying hypermetabolic liver lesion. 3. Probable sacral insufficiency fracture. Electronically Signed   By: Misty Stanley M.D.   On: 07/14/2015 15:25    ASSESSMENT & PLAN:     79 year old Caucasian female with  #1 IgA kappa monoclonal gammopathy of undetermined significance. M protein spike of 0.2. PET/CT scan shows no evidence of hypermetabolic bone lesions. No anemia, no renal failure no hypercalcemia. Significantly altered kappa lambda light chain ratio along with non-IgG MGUS suggesting a higher risk of progression to a more concerning clonal plasma cell dyscrasia. We did a microglobulin is somewhat elevated at 3.74. #2 Incidentally noted bilateral sacral insufficiency fractures right greater than left. Plan -Spent significant time discussing the patient's lab results, urine studies and PET CT scan findings with the  patient and her Hunter. All their questions were reviewed and answered in details. -Patient makes an informed decision to hold off on a bone marrow aspiration and biopsy at this time and continue clinical and lab follow-up for her possible plasma cell dyscrasia. Return to care with Dr. Irene Limbo in 3-4 months with CBC, CMP, SPEP with IFE and quantitative immunoglobulins.  #3 Incidentally noted small focus of hypermetabolism in the liver adjacent to the gallbladder with gallbladder showing a small soft tissue lesion/calcified stone.Patient is asymptomatic with this.  Plan I discussed with the patient the option of getting an immediate MRI of the liver or having this done with her primary care physician.  The patient chooses to have this done in a few months prior to her next follow-up visit and notes that she will seek attention if she has new symptoms. This appears to be reasonable.  #4 osteoarthritis and polymyalgia rheumatica -Being managed by Dr. Estanislado Pandy from rheumatology.  I spent 20 minutes counseling the patient face to face. The total time spent in the appointment was 25 minutes and more than 50% was on counseling and direct patient cares.    Sullivan Lone MD [place] AAHIVMS [facility] Hematology/Oncology Physician [facility]  (Office):       [phone] (Work cell):  [phone] (Fax):           [phone]

## 2015-08-01 NOTE — Telephone Encounter (Signed)
per pof to schpt appt-pt has MRI order-[facility] will call to [facility]

## 2015-08-07 DIAGNOSIS — Z79891 Long term (current) use of opiate analgesic: Secondary | ICD-10-CM | POA: Diagnosis not present

## 2015-08-07 DIAGNOSIS — G894 Chronic pain syndrome: Secondary | ICD-10-CM | POA: Diagnosis not present

## 2015-08-07 DIAGNOSIS — S329XXS Fracture of unspecified parts of lumbosacral spine and pelvis, sequela: Secondary | ICD-10-CM | POA: Diagnosis not present

## 2015-08-07 DIAGNOSIS — M7502 Adhesive capsulitis of left shoulder: Secondary | ICD-10-CM | POA: Diagnosis not present

## 2015-08-24 ENCOUNTER — Other Ambulatory Visit: Payer: Self-pay | Admitting: Family Medicine

## 2015-08-26 NOTE — Telephone Encounter (Signed)
Refill sent to pharmacy as instructed. 

## 2015-08-26 NOTE — Telephone Encounter (Signed)
See allergy/contraindication. Is it okay to refill?

## 2015-08-26 NOTE — Telephone Encounter (Signed)
It is fine to refill for a year

## 2015-09-09 DIAGNOSIS — Z79891 Long term (current) use of opiate analgesic: Secondary | ICD-10-CM | POA: Diagnosis not present

## 2015-09-09 DIAGNOSIS — M4726 Other spondylosis with radiculopathy, lumbar region: Secondary | ICD-10-CM | POA: Diagnosis not present

## 2015-09-09 DIAGNOSIS — S329XXS Fracture of unspecified parts of lumbosacral spine and pelvis, sequela: Secondary | ICD-10-CM | POA: Diagnosis not present

## 2015-09-09 DIAGNOSIS — G894 Chronic pain syndrome: Secondary | ICD-10-CM | POA: Diagnosis not present

## 2015-09-11 ENCOUNTER — Ambulatory Visit (INDEPENDENT_AMBULATORY_CARE_PROVIDER_SITE_OTHER): Payer: 59 | Admitting: Family Medicine

## 2015-09-11 ENCOUNTER — Encounter: Payer: Self-pay | Admitting: Family Medicine

## 2015-09-11 ENCOUNTER — Ambulatory Visit (HOSPITAL_COMMUNITY)
Admission: RE | Admit: 2015-09-11 | Discharge: 2015-09-11 | Disposition: A | Discharge status: Home / Self Care | Payer: 59 | Source: Ambulatory Visit | Attending: Family Medicine | Admitting: Family Medicine

## 2015-09-11 VITALS — BP 116/70 | HR 67 | Temp 98.3°F | Ht 67.0 in | Wt 237.2 lb

## 2015-09-11 DIAGNOSIS — M353 Polymyalgia rheumatica: Secondary | ICD-10-CM

## 2015-09-11 DIAGNOSIS — M79605 Pain in left leg: Secondary | ICD-10-CM | POA: Diagnosis not present

## 2015-09-11 DIAGNOSIS — M7989 Other specified soft tissue disorders: Secondary | ICD-10-CM

## 2015-09-11 DIAGNOSIS — R0600 Dyspnea, unspecified: Secondary | ICD-10-CM | POA: Diagnosis not present

## 2015-09-11 DIAGNOSIS — R7989 Other specified abnormal findings of blood chemistry: Secondary | ICD-10-CM

## 2015-09-11 DIAGNOSIS — I10 Essential (primary) hypertension: Secondary | ICD-10-CM | POA: Insufficient documentation

## 2015-09-11 DIAGNOSIS — R799 Abnormal finding of blood chemistry, unspecified: Secondary | ICD-10-CM

## 2015-09-11 DIAGNOSIS — E785 Hyperlipidemia, unspecified: Secondary | ICD-10-CM | POA: Diagnosis not present

## 2015-09-11 LAB — HEPATIC FUNCTION PANEL
ALBUMIN: 3.8 g/dL (ref 3.5–5.2)
ALK PHOS: 58 U/L (ref 39–117)
ALT: 16 U/L (ref 0–35)
AST: 19 U/L (ref 0–37)
Bilirubin, Direct: 0.1 mg/dL (ref 0.0–0.3)
TOTAL PROTEIN: 6.9 g/dL (ref 6.0–8.3)
Total Bilirubin: 0.6 mg/dL (ref 0.2–1.2)

## 2015-09-11 LAB — CBC WITH DIFFERENTIAL/PLATELET
BASOS ABS: 0 10*3/uL (ref 0.0–0.1)
BASOS PCT: 0.2 % (ref 0.0–3.0)
EOS ABS: 0 10*3/uL (ref 0.0–0.7)
Eosinophils Relative: 0.1 % (ref 0.0–5.0)
HEMATOCRIT: 39.5 % (ref 36.0–46.0)
HEMOGLOBIN: 12.6 g/dL (ref 12.0–15.0)
LYMPHS PCT: 21.4 % (ref 12.0–46.0)
Lymphs Abs: 1.6 10*3/uL (ref 0.7–4.0)
MCHC: 32 g/dL (ref 30.0–36.0)
MCV: 87.5 fl (ref 78.0–100.0)
Monocytes Absolute: 0.3 10*3/uL (ref 0.1–1.0)
Monocytes Relative: 3.7 % (ref 3.0–12.0)
Neutro Abs: 5.5 10*3/uL (ref 1.4–7.7)
Neutrophils Relative %: 74.6 % (ref 43.0–77.0)
Platelets: 203 10*3/uL (ref 150.0–400.0)
RBC: 4.51 Mil/uL (ref 3.87–5.11)
RDW: 17.4 % — ABNORMAL HIGH (ref 11.5–15.5)
WBC: 7.3 10*3/uL (ref 4.0–10.5)

## 2015-09-11 LAB — BASIC METABOLIC PANEL
BUN: 19 mg/dL (ref 6–23)
CHLORIDE: 103 meq/L (ref 96–112)
CO2: 31 meq/L (ref 19–32)
Calcium: 9.3 mg/dL (ref 8.4–10.5)
Creatinine, Ser: 0.95 mg/dL (ref 0.40–1.20)
GFR: 60.58 mL/min (ref >60.00)
GLUCOSE: 114 mg/dL — AB (ref 70–99)
POTASSIUM: 4 meq/L (ref 3.5–5.1)
SODIUM: 139 meq/L (ref 135–145)

## 2015-09-11 LAB — BRAIN NATRIURETIC PEPTIDE: Pro B Natriuretic peptide (BNP): 326 pg/mL — ABNORMAL HIGH (ref 0.0–100.0)

## 2015-09-11 LAB — SEDIMENTATION RATE: Sed Rate: 20 mm/hr (ref 0–22)

## 2015-09-11 NOTE — Progress Notes (Addendum)
Dr. Frederico Hamman T. Kimani Hovis, MD, [facility] [place], [address] Phone: [phone] Fax: [phone]  09/11/2015  Patient: [name], MRN: [id number], DOB: Jan 19, 1936, 80 y.o.  Primary Physician:  [name], MD   Chief Complaint  Patient presents with  . Edema    Feet & Hands  . Sore Throat   Subjective:   [name] is a 80 y.o. very pleasant female patient who presents with the following:  Patient with PMR, COPD, high-dose opioids for chronic pain but without a history of heart disease, renal disease or liver disease presents with left-sided lower extremity swelling and pain. No trauma or injury is known.  She also feels a little bit poorly and has a sore throat.  Dr. Estanislado Pandy - could it be caused by prednisone.  On prednisone for 6 months.  Originally on 20 mg.   L sided hand and foot swollen  L foot and leg - swollen for 1 week.  No h/o DVT.  Dr. Estanislado Pandy - copy of labs.   Past Medical History, Surgical History, Social History, Family History, Problem List, Medications, and Allergies have been reviewed and updated if relevant.  Patient Active Problem List   Diagnosis Date Noted  . Acute joint pain 10/14/2014  . Right foot pain 08/05/2014  . Right shoulder pain 08/05/2014  . Microscopic hematuria 05/21/2014  . PMR (polymyalgia rheumatica) ([place]) 05/15/2014  . Elevated sed rate 04/30/2014  . UTI (urinary tract infection) 04/30/2014  . Other malaise and fatigue 04/17/2014  . Diverticulosis 04/17/2014  . Bilateral arm pain 04/17/2014  . Abdominal pain, LLQ 04/17/2014  . Former smoker 12/12/2012  . Muscle cramps 07/12/2012  . Depression 04/19/2011  . Pre-syncope 03/08/2011  . OBESITY 10/03/2007  . VERTIGO 04/11/2007  . Hypothyroidism 12/27/2006  . HYPERCHOLESTEROLEMIA 12/27/2006  . Essential hypertension 12/27/2006  . SVT (supraventricular tachycardia) ([place]) 12/27/2006  . ALLERGIC  RHINITIS 12/27/2006  . COPD 12/27/2006  . OSTEOARTHRITIS 12/27/2006  . Chronic back pain 12/27/2006    Past Medical History  Diagnosis Date  . Hypertension   . Hyperlipidemia   . PVC's (premature ventricular contractions)   . Tobacco dependence   . IBS (irritable bowel syndrome)   . Diverticulitis   . Palpitations   . Chronic pain     Managed by Dr. Hardin Negus  . SVT (supraventricular tachycardia) ([place])   . Polymyalgia rheumatica ([facility])     Past Surgical History  Procedure Laterality Date  . Tonsillectomy    . Nasal sinus surgery    . Wrist fracture surgery    . Replacement total knee      Social History   Social History  . Marital Status: Married    Spouse Name: N/A  . Number of Children: 2  . Years of Education: N/A   Occupational History  .     Social History Main Topics  . Smoking status: Former Smoker -- 0.50 packs/day    Quit date: 12/12/2011  . Smokeless tobacco: Never Used  . Alcohol Use: No  . Drug Use: No  . Sexual Activity: Not on file   Other Topics Concern  . Not on file   Social History Narrative    Family History  Problem Relation Age of Onset  . Diabetes Mother   . Heart attack Father   . Heart disease Sister     Allergies  Allergen Reactions  . Amoxicillin-Pot Clavulanate     REACTION:  diarrhea  . Aspirin Nausea And Vomiting  . Atorvastatin     REACTION: feel weak  . Carisoprodol     REACTION: intolerant  . Codeine     REACTION: pt. reports allergy to codeine, reaction not known  . Codeine Phosphate     REACTION: UNKNOWN  . Cortisone     REACTION: pt. reports allergy, reaction not known  . Ezetimibe-Simvastatin     REACTION: feels weak  . Levothyroxine Sodium     REACTION: reaction not known  . Naproxen Sodium     REACTION: GI symptoms  . Omeprazole     REACTION: abd pain  . Quinapril Hcl     REACTION: reports allergy, reaction not known  . Rofecoxib     REACTION: reports allergy, reaction not known    Medication  list reviewed and updated in full in [facility] [place].   GEN: No acute illnesses, no fevers, chills. GI: No n/v/d, eating normally Pulm: No SOB Interactive and getting along well at home.  Otherwise, ROS is as per the HPI.  Objective:   BP 116/70 mmHg  Pulse 67  Temp(Src) 98.3 F (36.8 C) (Oral)  Ht 5\' 7"  (1.702 m)  Wt 237 lb 4 oz (107.616 kg)  BMI 37.15 kg/m2  GEN: WDWN, NAD, Non-toxic, A & O x 3 HEENT: Atraumatic, Normocephalic. Neck supple. No masses, No LAD. Throat clear Ears and Nose: No external deformity. TM clear CV: RRR, No M/G/R. No JVD. No thrill. No extra heart sounds. PULM: CTA B, no wheezes, crackles, rhonchi. No retractions. No resp. distress. No accessory muscle use. EXTR: No c/c. 1-2+ edema on the L LE and foot with none on the R. +homans sign NEURO Normal gait.  PSYCH: Normally interactive. Conversant. Not depressed or anxious appearing.  Calm demeanor.   Laboratory and Imaging Data: Results for orders placed or performed in visit on 09/11/15  CBC with Differential/Platelet  Result Value Ref Range   WBC 7.3 4.0 - 10.5 K/uL   RBC 4.51 3.87 - 5.11 Mil/uL   Hemoglobin 12.6 12.0 - 15.0 g/dL   HCT 39.5 36.0 - 46.0 %   MCV 87.5 78.0 - 100.0 fl   MCHC 32.0 30.0 - 36.0 g/dL   RDW 17.4 (H) 11.5 - 15.5 %   Platelets 203.0 150.0 - 400.0 K/uL   Neutrophils Relative % 74.6 43.0 - 77.0 %   Lymphocytes Relative 21.4 12.0 - 46.0 %   Monocytes Relative 3.7 3.0 - 12.0 %   Eosinophils Relative 0.1 0.0 - 5.0 %   Basophils Relative 0.2 0.0 - 3.0 %   Neutro Abs 5.5 1.4 - 7.7 K/uL   Lymphs Abs 1.6 0.7 - 4.0 K/uL   Monocytes Absolute 0.3 0.1 - 1.0 K/uL   Eosinophils Absolute 0.0 0.0 - 0.7 K/uL   Basophils Absolute 0.0 0.0 - 0.1 K/uL  Sedimentation rate  Result Value Ref Range   Sed Rate 20 0 - 22 mm/hr  Basic metabolic panel  Result Value Ref Range   Sodium 139 135 - 145 mEq/L   Potassium 4.0 3.5 - 5.1 mEq/L   Chloride 103 96 - 112 mEq/L   CO2 31 19 - 32 mEq/L    Glucose, Bld 114 (H) 70 - 99 mg/dL   BUN 19 6 - 23 mg/dL   Creatinine, Ser 0.95 0.40 - 1.20 mg/dL   Calcium 9.3 8.4 - 10.5 mg/dL   GFR 60.58 >60.00 mL/min  Hepatic function panel  Result Value Ref Range  Total Bilirubin 0.6 0.2 - 1.2 mg/dL   Bilirubin, Direct 0.1 0.0 - 0.3 mg/dL   Alkaline Phosphatase 58 39 - 117 U/L   AST 19 0 - 37 U/L   ALT 16 0 - 35 U/L   Total Protein 6.9 6.0 - 8.3 g/dL   Albumin 3.8 3.5 - 5.2 g/dL  Brain natriuretic peptide  Result Value Ref Range   Pro B Natriuretic peptide (BNP) 326.0 (H) 0.0 - 100.0 pg/mL     Assessment and Plan:   Left leg swelling - Plan: CBC with Differential/Platelet, Basic metabolic panel, Hepatic function panel, Brain natriuretic peptide, VAS [place] LOWER EXTREMITY VENOUS (DVT)  Left leg pain - Plan: CBC with Differential/Platelet, Basic metabolic panel, Hepatic function panel, VAS [place] LOWER EXTREMITY VENOUS (DVT)  PMR (polymyalgia rheumatica) ([facility]) - Plan: CBC with Differential/Platelet, Sedimentation rate  Dyspnea - Plan: Brain natriuretic peptide  Left leg swelling of unclear source. Obtain laboratories.  Left leg swelling with none on the right and painful to palpation. Painful posteriorly. Bevelyn Buckles is positive. Given risk for potential DVT, obtain a stat US in the left lower extremity.  Addendum: DVT is negative. With BNP of 326, obtain and echo to assess for CHF.  Start Lasix 20 mg daily for now.  Follow-up: depending on results.  New Prescriptions   No medications on file   Modified Medications   No medications on file   Orders Placed This Encounter  Procedures  . CBC with Differential/Platelet  . Sedimentation rate  . Basic metabolic panel  . Hepatic function panel  . Brain natriuretic peptide    Signed,  Rheda Kassab T. Beya Tipps, MD   Patient's Medications  New Prescriptions   No medications on file  Previous Medications   MECLIZINE (ANTIVERT) 25 MG TABLET    TAKE 1 TABLET (25 MG TOTAL) BY MOUTH 3 (THREE)  TIMES DAILY AS NEEDED FOR DIZZINESS.   METOPROLOL TARTRATE (LOPRESSOR) 25 MG TABLET    TAKE 1 TABLET (25 MG TOTAL) BY MOUTH 2 (TWO) TIMES DAILY.   MORPHINE (MS CONTIN) 60 MG 12 HR TABLET    Take 60 mg by mouth every 12 (twelve) hours.   PANTOPRAZOLE (PROTONIX) 40 MG TABLET    TAKE 1 TABLET (40 MG TOTAL) BY MOUTH DAILY.   PREDNISONE (DELTASONE) 1 MG TABLET    TAKE 3 TABLETS BY MOUTH EVERY DAY WITH 5MG  TABLET TO TOTAL 8MG    PREDNISONE (DELTASONE) 5 MG TABLET    TAKE 1 TABLET BY MOUTH EVERY DAY WITH 1 MG TABLET TOTAL 8MG    SERTRALINE (ZOLOFT) 50 MG TABLET    TAKE 1 TABLET BY MOUTH EVERY DAY   SPIRONOLACTONE (ALDACTONE) 25 MG TABLET    TAKE 1 TABLET (25 MG TOTAL) BY MOUTH DAILY.   THYROID (ARMOUR THYROID) 60 MG TABLET    TAKE 1 TABLET (60 MG TOTAL) BY MOUTH DAILY BEFORE BREAKFAST.   WHEAT DEXTRIN (BENEFIBER PO)    Take by mouth 2 (two) times daily.  Modified Medications   No medications on file  Discontinued Medications   PREDNISONE (DELTASONE) 20 MG TABLET    Take 1 tablet (20 mg total) by mouth daily with breakfast.

## 2015-09-12 ENCOUNTER — Telehealth: Payer: Self-pay | Admitting: *Deleted

## 2015-09-12 MED ORDER — FUROSEMIDE 20 MG PO TABS: 20.0000 mg | ORAL_TABLET | Freq: Every day | ORAL | Status: DC

## 2015-09-12 NOTE — Telephone Encounter (Signed)
Mrs. Barbin notified as instructed by telephone.

## 2015-09-12 NOTE — Addendum Note (Signed)
Addended by: Owens Loffler on: 09/12/2015 10:58 AM   Modules accepted: Orders, SmartSet

## 2015-09-12 NOTE — Telephone Encounter (Signed)
Lasix is in addition to the spironolactone. (don't stop the old one.)  She can take one tonight if she has not already.

## 2015-09-12 NOTE — Telephone Encounter (Addendum)
Mrs. Kieran notified as instructed by telephone.  Lasix prescription sent into [facility].  Patient will await call from Julius Bowels with Echo appointment.  Mrs. Cossio states her cardiologist is Dr. [place].

## 2015-09-12 NOTE — Telephone Encounter (Signed)
-----   Message from Owens Loffler, MD sent at 09/12/2015 10:54 AM EST ----- Please call  Make sure she knows that her DVT scan was negative (they should have told her)  Most labs came back normal, but she had one abnormal one.  A lab called BNP came back high - it might mean she has some early heart failure. Definitely, we need to follow-up with a heart ultrasound to make sure.  That could be causing the swelling, but we don't know for sure.  Now, lets add a new diuretic - at least for now. Lasix 20 mg, 1 po daily, #30, 1 ref.

## 2015-09-12 NOTE — Telephone Encounter (Signed)
Pt took Spironolactone (old diuretic) this morning around 7am. Now that you have called in the new diuretic, pt wants to know if she should take new pill tonight? Please advise   Echo scheduled for 09/25/15

## 2015-09-15 ENCOUNTER — Telehealth: Payer: Self-pay | Admitting: Cardiology

## 2015-09-16 NOTE — Telephone Encounter (Signed)
Close encounter 

## 2015-09-22 ENCOUNTER — Ambulatory Visit (INDEPENDENT_AMBULATORY_CARE_PROVIDER_SITE_OTHER): Payer: 59 | Admitting: Cardiology

## 2015-09-22 ENCOUNTER — Encounter: Payer: Self-pay | Admitting: Cardiology

## 2015-09-22 VITALS — BP 120/68 | HR 66 | Ht 67.0 in | Wt 231.0 lb

## 2015-09-22 DIAGNOSIS — I498 Other specified cardiac arrhythmias: Secondary | ICD-10-CM | POA: Diagnosis not present

## 2015-09-22 DIAGNOSIS — R600 Localized edema: Secondary | ICD-10-CM

## 2015-09-22 DIAGNOSIS — I10 Essential (primary) hypertension: Secondary | ICD-10-CM | POA: Diagnosis not present

## 2015-09-22 DIAGNOSIS — I471 Supraventricular tachycardia: Secondary | ICD-10-CM

## 2015-09-22 DIAGNOSIS — R609 Edema, unspecified: Secondary | ICD-10-CM | POA: Diagnosis not present

## 2015-09-22 LAB — BASIC METABOLIC PANEL
BUN: 21 mg/dL (ref 7–25)
CALCIUM: 9.4 mg/dL (ref 8.6–10.4)
CO2: 31 mmol/L (ref 20–31)
CREATININE: 1.04 mg/dL — AB (ref 0.60–0.93)
Chloride: 97 mmol/L — ABNORMAL LOW (ref 98–110)
Glucose, Bld: 102 mg/dL — ABNORMAL HIGH (ref 65–99)
Potassium: 3.9 mmol/L (ref 3.5–5.3)
Sodium: 139 mmol/L (ref 135–146)

## 2015-09-22 NOTE — Progress Notes (Signed)
[name] Date of Birth: [birth year]   History of Present Illness: [name] is seen for evaluation of left foot edema.  She has a history of SVT and PVCs. She is on chronic metoprolol therapy. She has  PMR and is now on chronic steroids. She reports that 3 weeks ago she had swelling in her left hand that resolved about 10 days later. She then developed swelling in her left foot. This has persisted. No pain. LE venous dopplers were negative for DVT. Labs were remarkable for an elevated BNP of 326. She was started on lasix 20 mg daily. Weight has decreased 6 lbs but she states swelling really hasn't changed. She denies any SOB or PND. Weight fluctuates a lot with prednisone anyway.she had remote Echo in 2005 that showed inferior HK. Subsequent Adenosine Myoview was normal with normal EF. She denies any recent increase in palpitations.   Current Outpatient Prescriptions on File Prior to Visit  Medication Sig Dispense Refill  . furosemide (LASIX) 20 MG tablet Take 1 tablet (20 mg total) by mouth daily. 30 tablet 1  . meclizine (ANTIVERT) 25 MG tablet TAKE 1 TABLET (25 MG TOTAL) BY MOUTH 3 (THREE) TIMES DAILY AS NEEDED FOR DIZZINESS. 30 tablet 2  . metoprolol tartrate (LOPRESSOR) 25 MG tablet TAKE 1 TABLET (25 MG TOTAL) BY MOUTH 2 (TWO) TIMES DAILY. 180 tablet 1  . morphine (MS CONTIN) 60 MG 12 hr tablet Take 60 mg by mouth every 12 (twelve) hours.  0  . pantoprazole (PROTONIX) 40 MG tablet TAKE 1 TABLET (40 MG TOTAL) BY MOUTH DAILY. 90 tablet 3  . predniSONE (DELTASONE) 1 MG tablet TAKE 3 TABLETS BY MOUTH EVERY DAY WITH 5MG  TABLET TO TOTAL 8MG   1  . predniSONE (DELTASONE) 5 MG tablet TAKE 1 TABLET BY MOUTH EVERY DAY WITH 1 MG TABLET TOTAL 8MG   0  . sertraline (ZOLOFT) 50 MG tablet TAKE 1 TABLET BY MOUTH EVERY DAY 90 tablet 3  . spironolactone (ALDACTONE) 25 MG tablet TAKE 1 TABLET (25 MG TOTAL) BY MOUTH DAILY. 90 tablet 3  . thyroid (ARMOUR THYROID) 60 MG tablet TAKE 1 TABLET (60 MG TOTAL) BY  MOUTH DAILY BEFORE BREAKFAST. 90 tablet 2  . Wheat Dextrin (BENEFIBER PO) Take by mouth 2 (two) times daily.     No current facility-administered medications on file prior to visit.    Allergies  Allergen Reactions  . Amoxicillin-Pot Clavulanate     REACTION: diarrhea  . Aspirin Nausea And Vomiting  . Atorvastatin     REACTION: feel weak  . Carisoprodol     REACTION: intolerant  . Codeine     REACTION: pt. reports allergy to codeine, reaction not known  . Codeine Phosphate     REACTION: UNKNOWN  . Cortisone     REACTION: pt. reports allergy, reaction not known  . Ezetimibe-Simvastatin     REACTION: feels weak  . Levothyroxine Sodium     REACTION: reaction not known  . Naproxen Sodium     REACTION: GI symptoms  . Omeprazole     REACTION: abd pain  . Quinapril Hcl     REACTION: reports allergy, reaction not known  . Rofecoxib     REACTION: reports allergy, reaction not known    Past Medical History  Diagnosis Date  . Hypertension   . Hyperlipidemia   . PVC's (premature ventricular contractions)   . Tobacco dependence   . IBS (irritable bowel syndrome)   . Diverticulitis   .  Palpitations   . Chronic pain     Managed by Dr. Hardin Negus  . SVT (supraventricular tachycardia) ([place])   . Polymyalgia rheumatica ([facility])     Past Surgical History  Procedure Laterality Date  . Tonsillectomy    . Nasal sinus surgery    . Wrist fracture surgery    . Replacement total knee      History  Smoking status  . Former Smoker -- 0.50 packs/day  . Quit date: 12/12/2011  Smokeless tobacco  . Never Used    History  Alcohol Use No    Family History  Problem Relation Age of Onset  . Diabetes Mother   . Heart attack Father   . Heart disease Sister     Review of Systems: The review of systems is as above.   All other systems were reviewed and are negative.  Physical Exam: BP 120/68 mmHg  Pulse 66  Ht 5\' 7"  (1.702 m)  Wt 104.781 kg (231 lb)  BMI 36.17 kg/m2 Patient is  in no acute distress.  She is obese. Skin is warm and dry. Color is normal.  HEENT is unremarkable. Normocephalic/atraumatic. PERRL. Sclera are nonicteric. Neck is supple. No masses. No JVD. Lungs are clear. Cardiac exam shows a regular rate and rhythm. Normal S1-2 without gallop or murmur.  Abdomen is soft and obese. Extremities reveal 1+ edema left foot.  She walks with a cane.  No gross neurologic deficits noted.  LABORATORY DATA:   Lab Results  Component Value Date   WBC 7.3 09/11/2015   HGB 12.6 09/11/2015   HCT 39.5 09/11/2015   PLT 203.0 09/11/2015   GLUCOSE 114* 09/11/2015   CHOL 190 04/17/2014   TRIG 124.0 04/17/2014   HDL 37.60* 04/17/2014   LDLDIRECT 136.4 05/24/2012   LDLCALC 128* 04/17/2014   ALT 16 09/11/2015   AST 19 09/11/2015   NA 139 09/11/2015   K 4.0 09/11/2015   CL 103 09/11/2015   CREATININE 0.95 09/11/2015   BUN 19 09/11/2015   CO2 31 09/11/2015   TSH 19.80* 02/19/2015   INR 1.00 12/10/2010   [id number] NSR with nonspecific ST-T wave changes. I have personally reviewed and interpreted this study.    Assessment / Plan: 1. Supraventricular tachycardia. Well controlled on her current dose of metoprolol. We will continue same dose and followup again in 6 months.  2. PVCs. Asymptomatic  3.  Left foot edema. No other findings of CHF. BNP was elevated but clinically does not appear to be in heart failure. ? If fluid retention related to steroids. Will update Echo. Avoid sodium. Will repeat BMET and BNP today on lasix.  4. HTN controlled.  5. Hypothyroidism  6. PMR. On steroids.

## 2015-09-22 NOTE — Patient Instructions (Signed)
We will check your kidney function  Continue your current therapy  Proceed with the Echocardiogram  We will call with the results.

## 2015-09-23 LAB — BRAIN NATRIURETIC PEPTIDE: Brain Natriuretic Peptide: 130.2 pg/mL — ABNORMAL HIGH (ref 0.0–100.0)

## 2015-09-25 ENCOUNTER — Other Ambulatory Visit: Payer: Self-pay

## 2015-09-25 ENCOUNTER — Ambulatory Visit (HOSPITAL_COMMUNITY): Payer: 59 | Attending: Family Medicine

## 2015-09-25 DIAGNOSIS — R0600 Dyspnea, unspecified: Secondary | ICD-10-CM | POA: Diagnosis not present

## 2015-09-25 DIAGNOSIS — R0602 Shortness of breath: Secondary | ICD-10-CM | POA: Diagnosis not present

## 2015-09-25 DIAGNOSIS — R7989 Other specified abnormal findings of blood chemistry: Secondary | ICD-10-CM

## 2015-09-25 DIAGNOSIS — I358 Other nonrheumatic aortic valve disorders: Secondary | ICD-10-CM | POA: Diagnosis not present

## 2015-09-25 DIAGNOSIS — R799 Abnormal finding of blood chemistry, unspecified: Secondary | ICD-10-CM | POA: Diagnosis not present

## 2015-10-03 ENCOUNTER — Telehealth: Payer: Self-pay | Admitting: Cardiology

## 2015-10-03 NOTE — Telephone Encounter (Signed)
New message      Pt is having leg cramps daily.  She is also starting to have them in her hands.  Can see take a drug over the counter called cramps 911?

## 2015-10-03 NOTE — Telephone Encounter (Signed)
Pt reports cramping in legs. Asking for advice on what to do. Reviewed recent studies w/ patient.  Last BMET reviewed - results including K+ were normal.   Pt asking about use of topical OTC rub, called "Cramps 911". Asking if OK to take for leg pain. Advised OK - monitor for any dermatological reactions and if rash, itching, etc present, discontinue use.  I asked about leg swelling. She denies but states difficult to assess if present or worse than typical. Advised if swelling worse, can increase lasix by additional 20mg  daily for 2-3 days. O/w, seek recommendations from primary care.   Pt voiced understanding. She is aware she is welcome to call back Monday w/ update or if new concerns.  Routed to Dr. [place] for any additional recommendations.

## 2015-10-08 DIAGNOSIS — G894 Chronic pain syndrome: Secondary | ICD-10-CM | POA: Diagnosis not present

## 2015-10-08 DIAGNOSIS — M4726 Other spondylosis with radiculopathy, lumbar region: Secondary | ICD-10-CM | POA: Diagnosis not present

## 2015-10-08 DIAGNOSIS — Z79891 Long term (current) use of opiate analgesic: Secondary | ICD-10-CM | POA: Diagnosis not present

## 2015-10-08 DIAGNOSIS — R609 Edema, unspecified: Secondary | ICD-10-CM | POA: Diagnosis not present

## 2015-10-10 ENCOUNTER — Ambulatory Visit (INDEPENDENT_AMBULATORY_CARE_PROVIDER_SITE_OTHER): Payer: 59 | Admitting: Family Medicine

## 2015-10-10 ENCOUNTER — Telehealth: Payer: Self-pay | Admitting: *Deleted

## 2015-10-10 ENCOUNTER — Ambulatory Visit (INDEPENDENT_AMBULATORY_CARE_PROVIDER_SITE_OTHER)
Admission: RE | Admit: 2015-10-10 | Discharge: 2015-10-10 | Disposition: A | Discharge status: Home / Self Care | Payer: 59 | Source: Ambulatory Visit | Attending: Family Medicine | Admitting: Family Medicine

## 2015-10-10 ENCOUNTER — Encounter: Payer: Self-pay | Admitting: Family Medicine

## 2015-10-10 VITALS — BP 122/84 | HR 57 | Temp 97.7°F | Ht 67.0 in | Wt 236.2 lb

## 2015-10-10 DIAGNOSIS — M79672 Pain in left foot: Secondary | ICD-10-CM

## 2015-10-10 DIAGNOSIS — Z23 Encounter for immunization: Secondary | ICD-10-CM | POA: Diagnosis not present

## 2015-10-10 DIAGNOSIS — R600 Localized edema: Secondary | ICD-10-CM

## 2015-10-10 NOTE — Telephone Encounter (Signed)
Can try magnesium over the counter 250 mg daily - if it does not give her diarrhea  Last labs did not indicate low potassium- but we can re check this in the future if above does not help

## 2015-10-10 NOTE — Progress Notes (Signed)
Pre visit review using our clinic review tool, if applicable. No additional management support is needed unless otherwise documented below in the visit note. 

## 2015-10-10 NOTE — Patient Instructions (Addendum)
Xray of foot today- to make sure you do not have any broken bones For swelling-get some knee high hose with support - this will help squeeze fluid out of ankles and make you more comfortable  Elevate your foot whenever you sit   We will contact you with result on Monday and make a plan  We will likely refer you to podiatry

## 2015-10-10 NOTE — Telephone Encounter (Signed)
Pt forgot to ask you what she can take for leg cramps

## 2015-10-10 NOTE — Progress Notes (Signed)
Subjective:    Patient ID: [name], female    DOB: 12/01/1935, 80 y.o.   MRN: [id number]  HPI Has been struggling with swelling   Both legs- L worse than the R  Both hands  Had venous doppler - no clot  Echo  Following BNP - coming down    Chemistry      Component Value Date/Time   NA 139 09/22/2015 0938   NA 142 07/01/2015 1513   K 3.9 09/22/2015 0938   K 3.9 07/01/2015 1513   CL 97* 09/22/2015 0938   CO2 31 09/22/2015 0938   CO2 30* 07/01/2015 1513   BUN 21 09/22/2015 0938   BUN 14.5 07/01/2015 1513   CREATININE 1.04* 09/22/2015 0938   CREATININE 0.95 09/11/2015 1200   CREATININE 1.1 07/01/2015 1513      Component Value Date/Time   CALCIUM 9.4 09/22/2015 0938   CALCIUM 10.0 07/01/2015 1513   ALKPHOS 58 09/11/2015 1200   ALKPHOS 58 07/01/2015 1513   AST 19 09/11/2015 1200   AST 14 07/01/2015 1513   ALT 16 09/11/2015 1200   ALT <9 07/01/2015 1513   BILITOT 0.6 09/11/2015 1200   BILITOT 0.80 07/01/2015 1513      Furosemide is helping some   " not nearly enough"  Has to have prednisone - for her PMR -? If that is the cause  Has not injured her foot  She does have pain in her L foot  Does not remember having any injury    Patient Active Problem List   Diagnosis Date Noted  . Left foot pain 10/10/2015  . Leg edema, left 09/22/2015  . Acute joint pain 10/14/2014  . Right foot pain 08/05/2014  . Right shoulder pain 08/05/2014  . Microscopic hematuria 05/21/2014  . PMR (polymyalgia rheumatica) ([place]) 05/15/2014  . Elevated sed rate 04/30/2014  . UTI (urinary tract infection) 04/30/2014  . Other malaise and fatigue 04/17/2014  . Diverticulosis 04/17/2014  . Bilateral arm pain 04/17/2014  . Abdominal pain, LLQ 04/17/2014  . Former smoker 12/12/2012  . Muscle cramps 07/12/2012  . Depression 04/19/2011  . Pre-syncope 03/08/2011  . OBESITY 10/03/2007  . VERTIGO 04/11/2007  . Hypothyroidism 12/27/2006  . HYPERCHOLESTEROLEMIA 12/27/2006  .  Essential hypertension 12/27/2006  . SVT (supraventricular tachycardia) ([place]) 12/27/2006  . ALLERGIC RHINITIS 12/27/2006  . COPD 12/27/2006  . OSTEOARTHRITIS 12/27/2006  . Chronic back pain 12/27/2006   Past Medical History  Diagnosis Date  . Hypertension   . Hyperlipidemia   . PVC's (premature ventricular contractions)   . Tobacco dependence   . IBS (irritable bowel syndrome)   . Diverticulitis   . Palpitations   . Chronic pain     Managed by Dr. Hardin Negus  . SVT (supraventricular tachycardia) ([place])   . Polymyalgia rheumatica ([facility])    Past Surgical History  Procedure Laterality Date  . Tonsillectomy    . Nasal sinus surgery    . Wrist fracture surgery    . Replacement total knee     Social History  Substance Use Topics  . Smoking status: Former Smoker -- 0.50 packs/day    Quit date: 12/12/2011  . Smokeless tobacco: Never Used  . Alcohol Use: No   Family History  Problem Relation Age of Onset  . Diabetes Mother   . Heart attack Father   . Heart disease Sister    Allergies  Allergen Reactions  . Amoxicillin-Pot Clavulanate     REACTION: diarrhea  . Aspirin Nausea  And Vomiting  . Atorvastatin     REACTION: feel weak  . Carisoprodol     REACTION: intolerant  . Codeine     REACTION: hallucinations  . Codeine Phosphate     REACTION: UNKNOWN  . Cortisone     REACTION: pt. reports allergy, reaction not known  . Ezetimibe-Simvastatin     REACTION: feels weak  . Levothyroxine Sodium     REACTION: reaction not known  . Naproxen Sodium     REACTION: GI symptoms  . Omeprazole     REACTION: abd pain  . Quinapril Hcl     REACTION: reports allergy, reaction not known  . Rofecoxib     REACTION: reports allergy, reaction not known   Current Outpatient Prescriptions on File Prior to Visit  Medication Sig Dispense Refill  . furosemide (LASIX) 20 MG tablet Take 1 tablet (20 mg total) by mouth daily. 30 tablet 1  . meclizine (ANTIVERT) 25 MG tablet TAKE 1 TABLET (25  MG TOTAL) BY MOUTH 3 (THREE) TIMES DAILY AS NEEDED FOR DIZZINESS. 30 tablet 2  . metoprolol tartrate (LOPRESSOR) 25 MG tablet TAKE 1 TABLET (25 MG TOTAL) BY MOUTH 2 (TWO) TIMES DAILY. 180 tablet 1  . pantoprazole (PROTONIX) 40 MG tablet TAKE 1 TABLET (40 MG TOTAL) BY MOUTH DAILY. 90 tablet 3  . predniSONE (DELTASONE) 1 MG tablet TAKE 3 TABLETS BY MOUTH EVERY DAY WITH 5MG  TABLET TO TOTAL 8MG   1  . predniSONE (DELTASONE) 5 MG tablet TAKE 1 TABLET BY MOUTH EVERY DAY WITH 1 MG TABLET TOTAL 8MG   0  . sertraline (ZOLOFT) 50 MG tablet TAKE 1 TABLET BY MOUTH EVERY DAY 90 tablet 3  . spironolactone (ALDACTONE) 25 MG tablet TAKE 1 TABLET (25 MG TOTAL) BY MOUTH DAILY. 90 tablet 3  . thyroid (ARMOUR THYROID) 60 MG tablet TAKE 1 TABLET (60 MG TOTAL) BY MOUTH DAILY BEFORE BREAKFAST. 90 tablet 2  . Wheat Dextrin (BENEFIBER PO) Take by mouth 2 (two) times daily.     No current facility-administered medications on file prior to visit.     Review of Systems    Review of Systems  Constitutional: Negative for fever, appetite change, fatigue and unexpected weight change.  Eyes: Negative for pain and visual disturbance.  Respiratory: Negative for cough and shortness of breath.   Cardiovascular: Negative for cp or palpitations   pos for pedal edema much worse on L  Gastrointestinal: Negative for nausea, diarrhea and constipation.  Genitourinary: Negative for urgency and frequency.  Skin: Negative for pallor or rash  MSK pos for pain in L foot with large bunion   Neurological: Negative for weakness, light-headedness, numbness and headaches.  Hematological: Negative for adenopathy. Does not bruise/bleed easily.  Psychiatric/Behavioral: Negative for dysphoric mood. The patient is not nervous/anxious.      Objective:   Physical Exam  Constitutional: She appears well-developed and well-nourished. No distress.  obese and well appearing   HENT:  Head: Normocephalic and atraumatic.  Mouth/Throat: Oropharynx is  clear and moist.  Eyes: Conjunctivae and EOM are normal. Pupils are equal, round, and reactive to light.  Neck: Normal range of motion. Neck supple. No JVD present. Carotid bruit is not present. No thyromegaly present.  Cardiovascular: Normal rate, regular rhythm, normal heart sounds and intact distal pulses.  Exam reveals no gallop.   Pulmonary/Chest: Effort normal and breath sounds normal. No respiratory distress. She has no wheezes. She has no rales.  No crackles  Abdominal: Soft. Bowel sounds are normal.  She exhibits no distension, no abdominal bruit and no mass. There is no tenderness.  Musculoskeletal: She exhibits edema and tenderness.  Mild non pitting edema R ankle  Significant dorsal edema of L foot-pitting with with tenderness over lateral MTPs  Large medial bunion deformity with lateral deviation of all toes  Hammer toes with corns in between Arthritic change overall   No appreciated swelling in hands  Some arthritic changes in hands   Lymphadenopathy:    She has no cervical adenopathy.  Neurological: She is alert. She has normal reflexes. No cranial nerve deficit. She exhibits normal muscle tone. Coordination normal.  Skin: Skin is warm and dry. No rash noted. No pallor.  Psychiatric: She has a normal mood and affect.          Assessment & Plan:   Problem List Items Addressed This Visit      Other   Left foot pain - Primary    Ongoing w/u for edema in both feet- reviewed (labs and cardiol input)- some help with lasix L foot is markedly more swollen and also tender -will get xr to r/u stress fx  Also significant joint deformity- hammer toe and bunion - would benefit from podiatry eval also  Recommend elevation/ cold compress prn - and well fitting shoes  Plan from there       Relevant Orders   DG Foot Complete Left (Completed)   Leg edema, left    Ongoing- also c/o some swelling in hands -hard to appreciate on exam  Rev w/u and tx so far  ? If injury also-xr  today Consider pod ref as well for baseline deformity       Other Visit Diagnoses    Need for influenza vaccination        Relevant Orders    Flu Vaccine QUAD 36+ mos PF IM (Fluarix & Fluzone Quad PF) (Completed)

## 2015-10-12 ENCOUNTER — Telehealth: Payer: Self-pay | Admitting: Family Medicine

## 2015-10-12 DIAGNOSIS — M79672 Pain in left foot: Secondary | ICD-10-CM

## 2015-10-12 NOTE — Assessment & Plan Note (Signed)
Ongoing w/u for edema in both feet- reviewed (labs and cardiol input)- some help with lasix L foot is markedly more swollen and also tender -will get xr to r/u stress fx  Also significant joint deformity- hammer toe and bunion - would benefit from podiatry eval also  Recommend elevation/ cold compress prn - and well fitting shoes  Plan from there

## 2015-10-12 NOTE — Assessment & Plan Note (Signed)
Ongoing- also c/o some swelling in hands -hard to appreciate on exam  Rev w/u and tx so far  ? If injury also-xr today Consider pod ref as well for baseline deformity

## 2015-10-12 NOTE — Telephone Encounter (Signed)
No fractures on xray Some degenerative change I made a ref to podiatry as we discussed-  I will route that to [facility]

## 2015-10-13 NOTE — Telephone Encounter (Signed)
Pt notified of xray results and Dr. Marliss Coots comments

## 2015-10-13 NOTE — Telephone Encounter (Signed)
Pt notified of Dr. Tower's comments and verbalized understanding  

## 2015-10-28 ENCOUNTER — Telehealth: Payer: Self-pay

## 2015-10-28 MED ORDER — LEVOTHYROXINE SODIUM 50 MCG PO TABS: 50.0000 ug | ORAL_TABLET | Freq: Every day | ORAL | Status: DC

## 2015-10-28 NOTE — Telephone Encounter (Addendum)
Pt left v/m armour thyroid is too expensive; pt wants to know if can change to different thyroid med that would be less expensive. Pt is not taking any thyroid med at this time. Pt had last thyroid labs 02/19/15. Pt request cb. [facility] [address]

## 2015-10-28 NOTE — Telephone Encounter (Signed)
Pt notified of Dr. Marliss Coots comments/ instructions and verbalized understanding. Pt wanted to have the extra labs done in April when she comes to have her thyroid level rechecked, pt said she will check with Dr. Garen Grams and will keep [place] updated

## 2015-10-28 NOTE — Telephone Encounter (Signed)
We would have to go back to generic levothyroxine  If she wants to do that please remove armour thyroid from list Send in levothyroxine 50 mcg 1 po qd #30 3 ref  Re check tsh in 4-6 weeks and we will adjust until therapeutic   Thanks

## 2015-10-28 NOTE — Telephone Encounter (Signed)
Pt notified of Dr. Marliss Coots comments and recommendations but she is a little nervous about restarting levothyroxine due to it causing dizziness last time (see 03/17/15 phone note), pt said she will try it so Rx sent and lab appt scheduled.  Pt did want me to let Dr. Glori Bickers know that she though that her armour thyroid was causing her muscle cramps so she stopped it and for a while she didn't have any cramps, pt hasn't taken the armour thyroid for about 3 weeks now and her muscle cramps are back pt said they are really bad and they are in her thigh/ groin area. Pt has tried the OTC magnesium Dr. Glori Bickers recommended but it's not helping. Pt wants to know what should she do for her painful cramps

## 2015-10-28 NOTE — Telephone Encounter (Signed)
I'm hoping that the levothyroxine may help her cramps -since she is off all thyroid supplementation now this could definitely worsen them  Will watch for dizziness- will titrate dose as needed  Not a lot of other options for thyroid (besides and endocrinology consult which is always an option)  If she wants to - we could run labs again to make sure electrolytes and other things are ok (CMET, mag level, cbc) -dx muscle cramps   Some people get improvement with mustard 1 Tbsp per day (or mustard seed) Stretches and PT can help  Magnesium (she is trying)   I did review her last rheum note (Dr Garen Grams)- she can also check in with her

## 2015-11-04 DIAGNOSIS — R609 Edema, unspecified: Secondary | ICD-10-CM | POA: Diagnosis not present

## 2015-11-04 DIAGNOSIS — G894 Chronic pain syndrome: Secondary | ICD-10-CM | POA: Diagnosis not present

## 2015-11-04 DIAGNOSIS — Z79891 Long term (current) use of opiate analgesic: Secondary | ICD-10-CM | POA: Diagnosis not present

## 2015-11-04 DIAGNOSIS — M4726 Other spondylosis with radiculopathy, lumbar region: Secondary | ICD-10-CM | POA: Diagnosis not present

## 2015-11-05 ENCOUNTER — Encounter: Payer: Self-pay | Admitting: Podiatry

## 2015-11-05 ENCOUNTER — Ambulatory Visit: Payer: 59

## 2015-11-05 ENCOUNTER — Ambulatory Visit (INDEPENDENT_AMBULATORY_CARE_PROVIDER_SITE_OTHER): Payer: 59 | Admitting: Podiatry

## 2015-11-05 VITALS — BP 119/84 | HR 68 | Resp 16 | Ht 68.0 in | Wt 262.0 lb

## 2015-11-05 DIAGNOSIS — M79604 Pain in right leg: Secondary | ICD-10-CM

## 2015-11-05 DIAGNOSIS — L84 Corns and callosities: Secondary | ICD-10-CM

## 2015-11-05 DIAGNOSIS — M779 Enthesopathy, unspecified: Secondary | ICD-10-CM | POA: Diagnosis not present

## 2015-11-05 DIAGNOSIS — M79674 Pain in right toe(s): Secondary | ICD-10-CM

## 2015-11-05 DIAGNOSIS — M79605 Pain in left leg: Secondary | ICD-10-CM

## 2015-11-05 DIAGNOSIS — L600 Ingrowing nail: Secondary | ICD-10-CM

## 2015-11-05 DIAGNOSIS — B351 Tinea unguium: Secondary | ICD-10-CM

## 2015-11-05 DIAGNOSIS — M21619 Bunion of unspecified foot: Secondary | ICD-10-CM | POA: Diagnosis not present

## 2015-11-05 DIAGNOSIS — M79675 Pain in left toe(s): Secondary | ICD-10-CM | POA: Diagnosis not present

## 2015-11-05 MED ORDER — TRIAMCINOLONE ACETONIDE 10 MG/ML IJ SUSP
10.0000 mg | Freq: Once | INTRAMUSCULAR | Status: AC
  Administered 2015-11-05: 10 mg

## 2015-11-05 NOTE — Progress Notes (Signed)
   Subjective:    Patient ID: [name], female    DOB: 17-Apr-1936, 80 y.o.   MRN: [id number]  HPI Patient presents with needing a bilateral nail trim.  Patient also presents with bilateral callouses; plantar forefoot; Right foot-3rd toe-top of toe.  Patient also presents with a bilateral nail problem; great toes. Pt stated, "wants nails checked"   Review of Systems  Cardiovascular: Positive for leg swelling.  Musculoskeletal: Positive for myalgias, back pain, arthralgias and gait problem.  All other systems reviewed and are negative.      Objective:   Physical Exam        Assessment & Plan:

## 2015-11-05 NOTE — Progress Notes (Signed)
Subjective:     Patient ID: [name], female   DOB: 1935/11/15, 80 y.o.   MRN: [id number]  HPI patient presents with discomfort in the sinus tarsi of both feet that have made it hard to walk trouble with the big toenails of both feet with incurvation of the beds thick nailbeds of all other nails and also is noted to have lesions underneath both feet that are painful. States that her feet in general really bother her   Review of Systems  All other systems reviewed and are negative.      Objective:   Physical Exam  Constitutional: She is oriented to person, place, and time.  Cardiovascular: Intact distal pulses.   Musculoskeletal: Normal range of motion.  Neurological: She is oriented to person, place, and time.  Skin: Skin is warm.  Nursing note and vitals reviewed.  neurovascular status found to be intact muscle strength was adequate range of motion mildly diminished subtalar joint due to pain in the ankle joint bilateral. Patient's noted to have significant forefoot instability with bunion formation digital formation and plantar keratotic lesion bilateral along with nail disease 1 through 5 bilateral with hallux nails being painful and thickened. 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 Today I reviewed x-rays with her and I then carefully injected the sinus tarsi bilateral 3 mg Kenalog 5 mg Xylocaine and discussed nail removal. Patient wants these procedures done and understands risk and today I infiltrated each hallux 60 mg Xylocaine Marcaine mixture remove the hallux nails exposed matrix and applied phenol for applications 30 seconds followed by  alcohol lavage and sterile dressing. Gave instructions on soaks. I then debrided nailbeds 234 and 5 of both feet and I debrided plantar calluses bilateral with no iatrogenic bleeding noted patient will be seen back for routine care or earlier if needed

## 2015-11-05 NOTE — Patient Instructions (Signed)

## 2015-11-06 ENCOUNTER — Other Ambulatory Visit: Payer: Self-pay | Admitting: Family Medicine

## 2015-11-07 ENCOUNTER — Telehealth: Payer: Self-pay | Admitting: *Deleted

## 2015-11-07 NOTE — Telephone Encounter (Signed)
Pt states she is having trouble understanding the soaking instruction for the ingrown toenail procedure.   Pt states she's not sure how long to soak or for how many days.  I told pt to soak 20 minutes 2 times daily for at least 1 week, then daily until the area got a dry, hard scab without drainage, redness, swelling or pain.  Pt asked if she also needed to use the neosporin if she was given an antibiotic.  I told pt yes.

## 2015-11-18 ENCOUNTER — Encounter: Payer: Self-pay | Admitting: Hematology

## 2015-11-18 ENCOUNTER — Telehealth: Payer: Self-pay | Admitting: Hematology

## 2015-11-18 ENCOUNTER — Ambulatory Visit (HOSPITAL_BASED_OUTPATIENT_CLINIC_OR_DEPARTMENT_OTHER): Payer: 59 | Admitting: Hematology

## 2015-11-18 ENCOUNTER — Other Ambulatory Visit (HOSPITAL_BASED_OUTPATIENT_CLINIC_OR_DEPARTMENT_OTHER): Payer: 59

## 2015-11-18 VITALS — BP 153/91 | HR 73 | Temp 97.9°F | Resp 18 | Ht 68.0 in | Wt 240.3 lb

## 2015-11-18 DIAGNOSIS — K769 Liver disease, unspecified: Secondary | ICD-10-CM

## 2015-11-18 DIAGNOSIS — D472 Monoclonal gammopathy: Secondary | ICD-10-CM | POA: Diagnosis not present

## 2015-11-18 DIAGNOSIS — K7689 Other specified diseases of liver: Secondary | ICD-10-CM | POA: Diagnosis not present

## 2015-11-18 LAB — CBC & DIFF AND RETIC
BASO%: 0.3 % (ref 0.0–2.0)
Basophils Absolute: 0 10*3/uL (ref 0.0–0.1)
EOS%: 0.5 % (ref 0.0–7.0)
Eosinophils Absolute: 0 10*3/uL (ref 0.0–0.5)
HCT: 35.4 % (ref 34.8–46.6)
HEMOGLOBIN: 11.5 g/dL — AB (ref 11.6–15.9)
Immature Retic Fract: 3.6 % (ref 1.60–10.00)
LYMPH%: 20.9 % (ref 14.0–49.7)
MCH: 29.9 pg (ref 25.1–34.0)
MCHC: 32.5 g/dL (ref 31.5–36.0)
MCV: 91.9 fL (ref 79.5–101.0)
MONO#: 0.7 10*3/uL (ref 0.1–0.9)
MONO%: 10.4 % (ref 0.0–14.0)
NEUT%: 67.9 % (ref 38.4–76.8)
NEUTROS ABS: 4.3 10*3/uL (ref 1.5–6.5)
Platelets: 169 10*3/uL (ref 145–400)
RBC: 3.85 10*6/uL (ref 3.70–5.45)
RDW: 16.4 % — ABNORMAL HIGH (ref 11.2–14.5)
Retic %: 1.47 % (ref 0.70–2.10)
Retic Ct Abs: 56.6 10*3/uL (ref 33.70–90.70)
WBC: 6.3 10*3/uL (ref 3.9–10.3)
lymph#: 1.3 10*3/uL (ref 0.9–3.3)

## 2015-11-18 LAB — COMPREHENSIVE METABOLIC PANEL
ALT: 16 U/L (ref 0–55)
ANION GAP: 8 meq/L (ref 3–11)
AST: 19 U/L (ref 5–34)
Albumin: 3.8 g/dL (ref 3.5–5.0)
Alkaline Phosphatase: 50 U/L (ref 40–150)
BILIRUBIN TOTAL: 0.61 mg/dL (ref 0.20–1.20)
BUN: 27.3 mg/dL — ABNORMAL HIGH (ref 7.0–26.0)
CHLORIDE: 105 meq/L (ref 98–109)
CO2: 26 meq/L (ref 22–29)
CREATININE: 1 mg/dL (ref 0.6–1.1)
Calcium: 9.4 mg/dL (ref 8.4–10.4)
EGFR: 53 mL/min/{1.73_m2} — AB (ref >90)
GLUCOSE: 104 mg/dL (ref 70–140)
Potassium: 4.2 mEq/L (ref 3.5–5.1)
SODIUM: 139 meq/L (ref 136–145)
TOTAL PROTEIN: 7.1 g/dL (ref 6.4–8.3)

## 2015-11-18 NOTE — Telephone Encounter (Signed)
per pof to sch pt appt-gave pt copy of avs-adv [facility] will call to sch scan °

## 2015-11-18 NOTE — Telephone Encounter (Signed)
per pof to sch pt appt-gave pt copy of avs °

## 2015-11-19 LAB — KAPPA/LAMBDA LIGHT CHAINS
IG KAPPA FREE LIGHT CHAIN: 187.97 mg/L — AB (ref 3.30–19.40)
IG LAMBDA FREE LIGHT CHAIN: 10.24 mg/L (ref 5.71–26.30)
KAPPA/LAMBDA FLC RATIO: 18.36 — AB (ref 0.26–1.65)

## 2015-11-19 NOTE — Progress Notes (Signed)
[name]  HEMATOLOGY ONCOLOGY PROGRESS NOTE  Date of service: .11/18/2015   Patient Care Team: Abner Greenspan, MD as PCP - General  Diagnosis:  IgA kappa MGUS  Current Treatment: observation  INTERVAL HISTORY:  [name]  is here for a scheduled 3 month follow-up for MGUS. She notes chronic low back pains which have not changed significantly. She notes that she had some increased left lower extremity swelling and had an ultrasound of her left lower extremity on 09/11/2015 which showed no evidence of left lower extremity deep or superficial venous thrombus or incompetence. She subsequently had a next area of her left foot in February which showed no fractures. She notes that she was called to schedule her MRI of the liver but decided not to do it since she had "too many other tests". He is agreeable to schedule an ultrasound of her abdomen which was ordered today. She continues to follow-up with Dr. Hardin Hunter for chronic pain management.  REVIEW OF SYSTEMS:    10 Point review of systems of done and is negative except as noted above.  . Past Medical History  Diagnosis Date  . Hypertension   . Hyperlipidemia   . PVC's (premature ventricular contractions)   . Tobacco dependence   . IBS (irritable bowel syndrome)   . Diverticulitis   . Palpitations   . Chronic pain     Managed by Dr. Hardin Hunter  . SVT (supraventricular tachycardia) ([place])   . Polymyalgia rheumatica ([place])     . Past Surgical History  Procedure Laterality Date  . Tonsillectomy    . Nasal sinus surgery    . Wrist fracture surgery    . Replacement total knee      . Social History  Substance Use Topics  . Smoking status: Former Smoker -- 0.50 packs/day    Quit date: 12/12/2011  . Smokeless tobacco: Never Used  . Alcohol Use: No    ALLERGIES:  is allergic to amoxicillin-pot clavulanate; aspirin; atorvastatin; carisoprodol; codeine; codeine phosphate; cortisone; ezetimibe-simvastatin; levothyroxine sodium;  naproxen sodium; omeprazole; quinapril hcl; and rofecoxib.  MEDICATIONS:  Current Outpatient Prescriptions  Medication Sig Dispense Refill  . ARMOUR THYROID 60 MG tablet TAKE 1 TABLET BY MOUTH EVERY DAY BEFORE BREAKFAST  2  . furosemide (LASIX) 20 MG tablet Take 1 tablet (20 mg total) by mouth daily. 30 tablet 1  . meclizine (ANTIVERT) 25 MG tablet TAKE 1 TABLET (25 MG TOTAL) BY MOUTH 3 (THREE) TIMES DAILY AS NEEDED FOR DIZZINESS. 30 tablet 2  . metoprolol tartrate (LOPRESSOR) 25 MG tablet TAKE 1 TABLET (25 MG TOTAL) BY MOUTH 2 (TWO) TIMES DAILY. 180 tablet 1  . morphine (MS CONTIN) 60 MG 12 hr tablet     . pantoprazole (PROTONIX) 40 MG tablet TAKE 1 TABLET (40 MG TOTAL) BY MOUTH DAILY. 90 tablet 3  . predniSONE (DELTASONE) 5 MG tablet TAKE 1 TABLET BY MOUTH EVERY DAY WITH 1 MG TABLET TOTAL 8MG   0  . sertraline (ZOLOFT) 50 MG tablet TAKE 1 TABLET BY MOUTH EVERY DAY 90 tablet 3  . spironolactone (ALDACTONE) 25 MG tablet TAKE 1 TABLET (25 MG TOTAL) BY MOUTH DAILY. 90 tablet 1  . Wheat Dextrin (BENEFIBER PO) Take by mouth 2 (two) times daily.     No current facility-administered medications for this visit.    PHYSICAL EXAMINATION: ECOG PERFORMANCE STATUS: 2 - Symptomatic, <50% confined to bed  . Filed Vitals:   11/18/15 1023  BP: 153/91  Pulse: 73  Temp: 97.9 F (  36.6 C)  Resp: 18    Filed Weights   11/18/15 1023  Weight: 240 lb 4.8 oz (108.999 kg)   .Body mass index is 36.55 kg/(m^2).  GENERAL:alert, in no acute distress and comfortable SKIN: skin color, texture, turgor are normal, no rashes or significant lesions EYES: normal, conjunctiva are pink and non-injected, sclera clear OROPHARYNX:no exudate, no erythema and lips, buccal mucosa, and tongue normal  NECK: supple, no JVD, thyroid normal size, non-tender, without nodularity LYMPH:  no palpable lymphadenopathy in the cervical, axillary or inguinal LUNGS: clear to auscultation with normal respiratory effort HEART:  regular rate & rhythm,  no murmurs and no lower extremity edema ABDOMEN: abdomen obese, soft, non-tender, normoactive bowel sounds  Musculoskeletal: no cyanosis of digits and no clubbing  PSYCH: alert & oriented x 3 with fluent speech NEURO: no focal motor/sensory deficits  LABORATORY DATA:   I have reviewed the data as listed  . CBC Latest Ref Rng 11/18/2015 09/11/2015 07/01/2015  WBC 3.9 - 10.3 10e3/uL 6.3 7.3 8.3  Hemoglobin 11.6 - 15.9 g/dL 11.5(L) 12.6 13.6  Hematocrit 34.8 - 46.6 % 35.4 39.5 43.0  Platelets 145 - 400 10e3/uL 169 203.0 193   . CBC    Component Value Date/Time   WBC 6.3 11/18/2015 0958   WBC 7.3 09/11/2015 1200   RBC 3.85 11/18/2015 0958   RBC 4.51 09/11/2015 1200   HGB 11.5* 11/18/2015 0958   HGB 12.6 09/11/2015 1200   HCT 35.4 11/18/2015 0958   HCT 39.5 09/11/2015 1200   PLT 169 11/18/2015 0958   PLT 203.0 09/11/2015 1200   MCV 91.9 11/18/2015 0958   MCV 87.5 09/11/2015 1200   MCH 29.9 11/18/2015 0958   MCH 29.7 12/11/2010 0436   MCHC 32.5 11/18/2015 0958   MCHC 32.0 09/11/2015 1200   RDW 16.4* 11/18/2015 0958   RDW 17.4* 09/11/2015 1200   LYMPHSABS 1.3 11/18/2015 0958   LYMPHSABS 1.6 09/11/2015 1200   MONOABS 0.7 11/18/2015 0958   MONOABS 0.3 09/11/2015 1200   EOSABS 0.0 11/18/2015 0958   EOSABS 0.0 09/11/2015 1200   BASOSABS 0.0 11/18/2015 0958   BASOSABS 0.0 09/11/2015 1200     . CMP Latest Ref Rng 11/18/2015 09/22/2015 09/11/2015  Glucose 70 - 140 mg/dl 104 102(H) 114(H)  BUN 7.0 - 26.0 mg/dL 27.3(H) 21 19  Creatinine 0.6 - 1.1 mg/dL 1.0 1.04(H) 0.95  Sodium 136 - 145 mEq/L 139 139 139  Potassium 3.5 - 5.1 mEq/L 4.2 3.9 4.0  Chloride 98 - 110 mmol/L - 97(L) 103  CO2 22 - 29 mEq/L 26 31 31   Calcium 8.4 - 10.4 mg/dL 9.4 9.4 9.3  Total Protein 6.4 - 8.3 g/dL 7.1 - 6.9  Total Bilirubin 0.20 - 1.20 mg/dL 0.61 - 0.6  Alkaline Phos 40 - 150 U/L 50 - 58  AST 5 - 34 U/L 19 - 19  ALT 0 - 55 U/L 16 - 16     RADIOGRAPHIC STUDIES: I have  personally reviewed the radiological images as listed and agreed with the findings in the report. No results found.  ASSESSMENT & PLAN:     80 year old Caucasian female with  #1 IgA kappa monoclonal gammopathy of undetermined significance. M protein spike of 0.2. PET/CT scan shows no evidence of hypermetabolic bone lesions. No anemia, no renal failure no hypercalcemia. Significantly altered kappa lambda light chain ratio along with non-IgG MGUS suggesting a higher risk of progression to a more concerning clonal plasma cell dyscrasia. We did a microglobulin is  somewhat elevated at 3.74. #2 Incidentally noted bilateral sacral insufficiency fractures right greater than left. Plan -Patient's hemoglobin levels are fairly stable. Renal function stable. No hypercalcemia. She continues to have chronic back pain which does not appear to have changed significantly. -Myeloma labs drawn today are pending -we shall follow these up. -No other acute new symptoms. -Patient notes that her sister wanted no more about MGUS. She was given printouts from the [place] website regarding this for further education.  #3 Incidentally noted small focus of hypermetabolism in the liver adjacent to the gallbladder with gallbladder showing a small soft tissue lesion/calcified stone.Patient continues to be asymptomatic with this. We had ordered an MRI of the liver which are schedulers called her with but she refused to have that done citing that there were too many things happening in her life. Plan -We discussed this finding again today. She notes that she is agreeable with getting an ultrasound of the abdomen instead of the MRI.  #4 osteoarthritis and polymyalgia rheumatica -Being managed by Dr. Estanislado Pandy from rheumatology. She also follows with Dr. Hardin Hunter for chronic pain management.  Return to care in 4 months with Dr. Irene Limbo with repeat labs    Sullivan Lone MD [place] AAHIVMS [facility] Hematology/Oncology Physician [place]  (Office):       [phone] (Work cell):  [phone] (Fax):           [phone]

## 2015-11-21 LAB — MULTIPLE MYELOMA PANEL, SERUM
ALBUMIN SERPL ELPH-MCNC: 3.8 g/dL (ref 2.9–4.4)
ALPHA2 GLOB SERPL ELPH-MCNC: 0.7 g/dL (ref 0.4–1.0)
Albumin/Glob SerPl: 1.3 (ref 0.7–1.7)
Alpha 1: 0.2 g/dL (ref 0.0–0.4)
B-GLOBULIN SERPL ELPH-MCNC: 1.1 g/dL (ref 0.7–1.3)
Gamma Glob SerPl Elph-Mcnc: 1 g/dL (ref 0.4–1.8)
Globulin, Total: 3 g/dL (ref 2.2–3.9)
IgA, Qn, Serum: 285 mg/dL (ref 64–422)
IgM, Qn, Serum: 29 mg/dL (ref 26–217)
TOTAL PROTEIN: 6.8 g/dL (ref 6.0–8.5)

## 2015-11-26 ENCOUNTER — Encounter: Payer: Self-pay | Admitting: Family Medicine

## 2015-11-26 ENCOUNTER — Ambulatory Visit (INDEPENDENT_AMBULATORY_CARE_PROVIDER_SITE_OTHER): Payer: 59 | Admitting: Family Medicine

## 2015-11-26 VITALS — BP 146/88 | HR 72 | Temp 98.7°F | Ht 68.0 in | Wt 242.0 lb

## 2015-11-26 DIAGNOSIS — R600 Localized edema: Secondary | ICD-10-CM

## 2015-11-26 DIAGNOSIS — E039 Hypothyroidism, unspecified: Secondary | ICD-10-CM | POA: Diagnosis not present

## 2015-11-26 DIAGNOSIS — I10 Essential (primary) hypertension: Secondary | ICD-10-CM | POA: Diagnosis not present

## 2015-11-26 MED ORDER — LEVOTHYROXINE SODIUM 50 MCG PO TABS: 50.0000 ug | ORAL_TABLET | Freq: Every day | ORAL | Status: DC

## 2015-11-26 NOTE — Progress Notes (Signed)
Subjective:    Patient ID: [name], female    DOB: 09/03/1935, 80 y.o.   MRN: [id number]  HPI Here for f/u of swelling   Was initially in L side more than right Now is equal   Had labs   Chemistry      Component Value Date/Time   NA 139 11/18/2015 0958   NA 139 09/22/2015 0938   K 4.2 11/18/2015 0958   K 3.9 09/22/2015 0938   CL 97* 09/22/2015 0938   CO2 26 11/18/2015 0958   CO2 31 09/22/2015 0938   BUN 27.3* 11/18/2015 0958   BUN 21 09/22/2015 0938   CREATININE 1.0 11/18/2015 0958   CREATININE 1.04* 09/22/2015 0938   CREATININE 0.95 09/11/2015 1200      Component Value Date/Time   CALCIUM 9.4 11/18/2015 0958   CALCIUM 9.4 09/22/2015 0938   ALKPHOS 50 11/18/2015 0958   ALKPHOS 58 09/11/2015 1200   AST 19 11/18/2015 0958   AST 19 09/11/2015 1200   ALT 16 11/18/2015 0958   ALT 16 09/11/2015 1200   BILITOT 0.61 11/18/2015 0958   BILITOT 0.6 09/11/2015 1200      Lab Results  Component Value Date   TSH 19.80* 02/19/2015    Had venous doppler-nl /no clot  Had 2D echo - was normal also   On lasix and aldactone - then lost her lasix bottle and is not on that any more   Is still on prednisone -no improvement - in fact swelling has become worse  ? If morphine could cause it   BP Readings from Last 3 Encounters:  11/26/15 146/88  11/18/15 153/91  11/05/15 119/84    No improvement with elevation of feet  Does not wear support stockings No improvement in the am when she gets up    Cannot afford armor thyroid -will need to switch to levothyroxine   Patient Active Problem List   Diagnosis Date Noted  . Pedal edema 11/27/2015  . MGUS (monoclonal gammopathy of unknown significance) 11/18/2015  . Liver lesion 11/18/2015  . Left foot pain 10/10/2015  . Leg edema, left 09/22/2015  . Acute joint pain 10/14/2014  . Right foot pain 08/05/2014  . Right shoulder pain 08/05/2014  . Microscopic hematuria 05/21/2014  . PMR (polymyalgia rheumatica) ([place])  05/15/2014  . Elevated sed rate 04/30/2014  . UTI (urinary tract infection) 04/30/2014  . Other malaise and fatigue 04/17/2014  . Diverticulosis 04/17/2014  . Bilateral arm pain 04/17/2014  . Abdominal pain, LLQ 04/17/2014  . Former smoker 12/12/2012  . Muscle cramps 07/12/2012  . Depression 04/19/2011  . Pre-syncope 03/08/2011  . OBESITY 10/03/2007  . VERTIGO 04/11/2007  . Hypothyroidism 12/27/2006  . HYPERCHOLESTEROLEMIA 12/27/2006  . Essential hypertension 12/27/2006  . SVT (supraventricular tachycardia) ([place]) 12/27/2006  . ALLERGIC RHINITIS 12/27/2006  . COPD 12/27/2006  . OSTEOARTHRITIS 12/27/2006  . Chronic back pain 12/27/2006   Past Medical History  Diagnosis Date  . Hypertension   . Hyperlipidemia   . PVC's (premature ventricular contractions)   . Tobacco dependence   . IBS (irritable bowel syndrome)   . Diverticulitis   . Palpitations   . Chronic pain     Managed by Dr. Hardin Negus  . SVT (supraventricular tachycardia) ([place])   . Polymyalgia rheumatica ([facility])    Past Surgical History  Procedure Laterality Date  . Tonsillectomy    . Nasal sinus surgery    . Wrist fracture surgery    . Replacement total knee  Social History  Substance Use Topics  . Smoking status: Former Smoker -- 0.50 packs/day    Quit date: 12/12/2011  . Smokeless tobacco: Never Used  . Alcohol Use: No   Family History  Problem Relation Age of Onset  . Diabetes Mother   . Heart attack Father   . Heart disease Sister    Allergies  Allergen Reactions  . Amoxicillin-Pot Clavulanate     REACTION: diarrhea  . Aspirin Nausea And Vomiting  . Atorvastatin     REACTION: feel weak  . Carisoprodol     REACTION: intolerant  . Codeine     REACTION: hallucinations  . Codeine Phosphate     REACTION: UNKNOWN  . Cortisone     REACTION: pt. reports allergy, reaction not known  . Ezetimibe-Simvastatin     REACTION: feels weak  . Naproxen Sodium     REACTION: GI symptoms  . Omeprazole       REACTION: abd pain  . Quinapril Hcl     REACTION: reports allergy, reaction not known  . Rofecoxib     REACTION: reports allergy, reaction not known   Current Outpatient Prescriptions on File Prior to Visit  Medication Sig Dispense Refill  . meclizine (ANTIVERT) 25 MG tablet TAKE 1 TABLET (25 MG TOTAL) BY MOUTH 3 (THREE) TIMES DAILY AS NEEDED FOR DIZZINESS. 30 tablet 2  . metoprolol tartrate (LOPRESSOR) 25 MG tablet TAKE 1 TABLET (25 MG TOTAL) BY MOUTH 2 (TWO) TIMES DAILY. 180 tablet 1  . morphine (MS CONTIN) 60 MG 12 hr tablet     . pantoprazole (PROTONIX) 40 MG tablet TAKE 1 TABLET (40 MG TOTAL) BY MOUTH DAILY. 90 tablet 3  . predniSONE (DELTASONE) 5 MG tablet TAKE 1 TABLET BY MOUTH EVERY DAY WITH 1 MG TABLET TOTAL 8MG   0  . sertraline (ZOLOFT) 50 MG tablet TAKE 1 TABLET BY MOUTH EVERY DAY 90 tablet 3  . spironolactone (ALDACTONE) 25 MG tablet TAKE 1 TABLET (25 MG TOTAL) BY MOUTH DAILY. 90 tablet 1  . Wheat Dextrin (BENEFIBER PO) Take by mouth 2 (two) times daily.     No current facility-administered medications on file prior to visit.     Review of Systems    Review of Systems  Constitutional: Negative for fever, appetite change,  and unexpected weight change. pos for fatigue Eyes: Negative for pain and visual disturbance.  Respiratory: Negative for cough and shortness of breath.   Cardiovascular: Negative for cp or palpitations   neg for PND and orthopnea  Gastrointestinal: Negative for nausea, diarrhea and constipation.  Genitourinary: Negative for urgency and frequency.  Skin: Negative for pallor or rash   Neurological: Negative for weakness, light-headedness, numbness and headaches.  Hematological: Negative for adenopathy. Does not bruise/bleed easily.  Psychiatric/Behavioral: Negative for dysphoric mood. The patient is not nervous/anxious.      Objective:   Physical Exam  Constitutional: She appears well-developed and well-nourished. No distress.  obese and well  appearing   HENT:  Head: Normocephalic and atraumatic.  Mouth/Throat: Oropharynx is clear and moist.  Eyes: Conjunctivae and EOM are normal. Pupils are equal, round, and reactive to light.  Neck: Normal range of motion. Neck supple. No JVD present. Carotid bruit is not present. No thyromegaly present.  Cardiovascular: Normal rate, regular rhythm, normal heart sounds and intact distal pulses.  Exam reveals no gallop.   Pulmonary/Chest: Effort normal and breath sounds normal. No respiratory distress. She has no wheezes. She has no rales.  No crackles Diffusely  distant bs   Abdominal: Soft. Bowel sounds are normal. She exhibits no distension, no abdominal bruit and no mass. There is no tenderness.  Musculoskeletal: She exhibits edema.  1-2 plus edema in ankles bilaterally with pitting No palpable cords or erythema or tenderness No skin breakdown  Baseline deformities with bunions on feet  Lymphadenopathy:    She has no cervical adenopathy.  Neurological: She is alert. She has normal reflexes.  Skin: Skin is warm and dry. No rash noted.  Some areas of hyperpigmentation on both lower legs    Psychiatric: She has a normal mood and affect.          Assessment & Plan:   Problem List Items Addressed This Visit      Cardiovascular and Mediastinum   Essential hypertension - Primary    bp is not at goal today- given the DASH diet handout- enc to dec sodium and inc water intake  BP Readings from Last 1 Encounters:  11/26/15 146/88   Will check at next visit  Disc lifstyle change with low sodium diet and exercise          Endocrine   Hypothyroidism    Pt can no longer afford armour thyroid and I doubt that it is adequately tx her hypothyroidism Will change back to levothyroxine 50 mcg daily  Hope this helps ankle swelling  tsh in 1 mo and titrate  Tried to explain to pt how this works re: labs and titration-unsure she understood/given handouts       Relevant Medications    levothyroxine (SYNTHROID, LEVOTHROID) 50 MCG tablet     Other   Pedal edema    Now bilateral and symmetric  Suspect that prednisone and MS may play a role as well as obesity  No imp with lasix -will d/c it  On aldactone Disc low sodium diet-given dash diet and inc water intake and work on wt loss  Elevate feet when able  Try otc knee highs with support to see if this helps and if she tolerates can consider px hose  expl that compression is important Rev 2D echo and labs and venous doppler all reassuring  She may have some chronic lymphedema

## 2015-11-26 NOTE — Patient Instructions (Signed)
Stop the armour thyroid tomorrow  Start the levothyroxine instead 50 mcg once daily  Schedule non fasting labs for 1 month for a thyroid check to see if you need a dose adjustment   Get some knee highs with "support" at a drug store or department store - size large if possible -wear them during the day  Elevate your feet when you can  Avoid sodium in diet -see the DASH diet handout  Make sure to drink water and less soda

## 2015-11-26 NOTE — Progress Notes (Signed)
Pre visit review using our clinic review tool, if applicable. No additional management support is needed unless otherwise documented below in the visit note. 

## 2015-11-27 NOTE — Assessment & Plan Note (Signed)
Pt can no longer afford armour thyroid and I doubt that it is adequately tx her hypothyroidism Will change back to levothyroxine 50 mcg daily  Hope this helps ankle swelling  tsh in 1 mo and titrate  Tried to explain to pt how this works re: labs and titration-unsure she [employer]

## 2015-11-27 NOTE — Assessment & Plan Note (Signed)
bp is not at goal today- given the DASH diet handout- enc to dec sodium and inc water intake  BP Readings from Last 1 Encounters:  11/26/15 146/88   Will check at next visit  Disc lifstyle change with low sodium diet and exercise

## 2015-11-27 NOTE — Assessment & Plan Note (Signed)
Now bilateral and symmetric  Suspect that prednisone and MS may play a role as well as obesity  No imp with lasix -will d/c it  On aldactone Disc low sodium diet-given dash diet and inc water intake and work on wt loss  Elevate feet when able  Try otc knee highs with support to see if this helps and if she tolerates can consider px hose  expl that compression is important Rev 2D echo and labs and venous doppler all reassuring  She may have some chronic lymphedema

## 2015-12-03 DIAGNOSIS — Z79891 Long term (current) use of opiate analgesic: Secondary | ICD-10-CM | POA: Diagnosis not present

## 2015-12-03 DIAGNOSIS — M4726 Other spondylosis with radiculopathy, lumbar region: Secondary | ICD-10-CM | POA: Diagnosis not present

## 2015-12-03 DIAGNOSIS — G894 Chronic pain syndrome: Secondary | ICD-10-CM | POA: Diagnosis not present

## 2015-12-03 DIAGNOSIS — R609 Edema, unspecified: Secondary | ICD-10-CM | POA: Diagnosis not present

## 2015-12-07 ENCOUNTER — Other Ambulatory Visit: Payer: Self-pay | Admitting: Family Medicine

## 2015-12-08 NOTE — Telephone Encounter (Signed)
Last follow up/med refill was 10/14/14.  Called patient - appointment scheduled 01/09/16.

## 2015-12-10 ENCOUNTER — Other Ambulatory Visit: Payer: Medicare Other

## 2015-12-10 ENCOUNTER — Ambulatory Visit: Payer: 59 | Admitting: Family Medicine

## 2015-12-17 ENCOUNTER — Ambulatory Visit (HOSPITAL_COMMUNITY): Admission: RE | Admit: 2015-12-17 | Payer: 59 | Source: Ambulatory Visit

## 2015-12-17 ENCOUNTER — Other Ambulatory Visit: Payer: Medicare Other

## 2015-12-18 ENCOUNTER — Other Ambulatory Visit: Payer: Self-pay | Admitting: *Deleted

## 2015-12-18 MED ORDER — LEVOTHYROXINE SODIUM 50 MCG PO TABS: 50.0000 ug | ORAL_TABLET | Freq: Every day | ORAL | Status: DC

## 2015-12-18 NOTE — Telephone Encounter (Signed)
Received fax saying pt wants 90 day supply, done

## 2015-12-24 ENCOUNTER — Other Ambulatory Visit: Payer: 59

## 2015-12-30 ENCOUNTER — Ambulatory Visit (HOSPITAL_COMMUNITY)
Admission: RE | Admit: 2015-12-30 | Discharge: 2015-12-30 | Disposition: A | Discharge status: Home / Self Care | Payer: 59 | Source: Ambulatory Visit | Attending: Hematology | Admitting: Hematology

## 2015-12-30 DIAGNOSIS — I714 Abdominal aortic aneurysm, without rupture: Secondary | ICD-10-CM | POA: Insufficient documentation

## 2015-12-30 DIAGNOSIS — K829 Disease of gallbladder, unspecified: Secondary | ICD-10-CM | POA: Diagnosis not present

## 2015-12-30 DIAGNOSIS — R918 Other nonspecific abnormal finding of lung field: Secondary | ICD-10-CM | POA: Insufficient documentation

## 2015-12-30 DIAGNOSIS — K769 Liver disease, unspecified: Secondary | ICD-10-CM

## 2015-12-30 DIAGNOSIS — K7689 Other specified diseases of liver: Secondary | ICD-10-CM | POA: Diagnosis not present

## 2016-01-02 DIAGNOSIS — G894 Chronic pain syndrome: Secondary | ICD-10-CM | POA: Diagnosis not present

## 2016-01-02 DIAGNOSIS — R609 Edema, unspecified: Secondary | ICD-10-CM | POA: Diagnosis not present

## 2016-01-02 DIAGNOSIS — Z79891 Long term (current) use of opiate analgesic: Secondary | ICD-10-CM | POA: Diagnosis not present

## 2016-01-02 DIAGNOSIS — M4726 Other spondylosis with radiculopathy, lumbar region: Secondary | ICD-10-CM | POA: Diagnosis not present

## 2016-01-05 ENCOUNTER — Ambulatory Visit (INDEPENDENT_AMBULATORY_CARE_PROVIDER_SITE_OTHER): Payer: 59 | Admitting: Family Medicine

## 2016-01-05 ENCOUNTER — Encounter: Payer: Self-pay | Admitting: Family Medicine

## 2016-01-05 VITALS — BP 136/80 | HR 104 | Temp 98.0°F | Wt 246.5 lb

## 2016-01-05 DIAGNOSIS — R600 Localized edema: Secondary | ICD-10-CM | POA: Diagnosis not present

## 2016-01-05 DIAGNOSIS — L03116 Cellulitis of left lower limb: Secondary | ICD-10-CM | POA: Diagnosis not present

## 2016-01-05 MED ORDER — DOXYCYCLINE HYCLATE 100 MG PO TABS: 100.0000 mg | ORAL_TABLET | Freq: Two times a day (BID) | ORAL | Status: DC

## 2016-01-05 NOTE — Assessment & Plan Note (Signed)
Persistent now with presumed cellulitis. Treat with oral doxycycline.   Follow up with PCP on Friday- ? UNA boot once erythema and pain have improved. The patient indicates understanding of these issues and agrees with the plan.

## 2016-01-05 NOTE — Patient Instructions (Signed)
Good to see you. Please take doxycycline as directed- 1 tablet twice daily for 10 days.  Keep your foot elevated.  Keep your appointment with Dr. Glori Bickers on Friday.

## 2016-01-05 NOTE — Progress Notes (Signed)
Pre visit review using our clinic review tool, if applicable. No additional management support is needed unless otherwise documented below in the visit note. 

## 2016-01-05 NOTE — Progress Notes (Signed)
Subjective:   Patient ID: [name], female    DOB: August 12, 1936, 80 y.o.   MRN: [id number]  [name] is a pleasant 80 y.o. year old female pt of Dr. Glori Bickers, new to me, who presents to clinic today with Foot Pain and Foot Swelling  on 01/05/2016  HPI:   Saw PCP for the complaint on 11/26/15.  Note reviewed. 2 D echo, labs and venous doppler reassuring. She suspected likely multifactorial- obesity, prednisone, chronic lymphedema, MS.  D/c'd lasix as it was not helping.  Advised to try OTC knee highs.  Since she was last seen, swelling in her left foot has worsened and now is red and warm which is traveling up her leg.  No fevers, chills, nausea or vomiting.  Current Outpatient Prescriptions on File Prior to Visit  Medication Sig Dispense Refill  . levothyroxine (SYNTHROID, LEVOTHROID) 50 MCG tablet Take 1 tablet (50 mcg total) by mouth daily. 90 tablet 0  . meclizine (ANTIVERT) 25 MG tablet TAKE 1 TABLET (25 MG TOTAL) BY MOUTH 3 (THREE) TIMES DAILY AS NEEDED FOR DIZZINESS. 30 tablet 2  . metoprolol tartrate (LOPRESSOR) 25 MG tablet TAKE 1 TABLET (25 MG TOTAL) BY MOUTH 2 (TWO) TIMES DAILY. 180 tablet 0  . morphine (MS CONTIN) 60 MG 12 hr tablet     . pantoprazole (PROTONIX) 40 MG tablet TAKE 1 TABLET (40 MG TOTAL) BY MOUTH DAILY. 90 tablet 3  . predniSONE (DELTASONE) 5 MG tablet TAKE 1 TABLET BY MOUTH EVERY DAY WITH 1 MG TABLET TOTAL 8MG   0  . sertraline (ZOLOFT) 50 MG tablet TAKE 1 TABLET BY MOUTH EVERY DAY 90 tablet 3  . spironolactone (ALDACTONE) 25 MG tablet TAKE 1 TABLET (25 MG TOTAL) BY MOUTH DAILY. 90 tablet 1  . Wheat Dextrin (BENEFIBER PO) Take by mouth 2 (two) times daily.     No current facility-administered medications on file prior to visit.    Allergies  Allergen Reactions  . Amoxicillin-Pot Clavulanate     REACTION: diarrhea  . Aspirin Nausea And Vomiting  . Atorvastatin     REACTION: feel weak  . Carisoprodol     REACTION: intolerant  . Codeine    REACTION: hallucinations  . Codeine Phosphate     REACTION: UNKNOWN  . Cortisone     REACTION: pt. reports allergy, reaction not known  . Ezetimibe-Simvastatin     REACTION: feels weak  . Naproxen Sodium     REACTION: GI symptoms  . Omeprazole     REACTION: abd pain  . Quinapril Hcl     REACTION: reports allergy, reaction not known  . Rofecoxib     REACTION: reports allergy, reaction not known    Past Medical History  Diagnosis Date  . Hypertension   . Hyperlipidemia   . PVC's (premature ventricular contractions)   . Tobacco dependence   . IBS (irritable bowel syndrome)   . Diverticulitis   . Palpitations   . Chronic pain     Managed by Dr. Hardin Negus  . SVT (supraventricular tachycardia) ([place])   . Polymyalgia rheumatica ([facility])     Past Surgical History  Procedure Laterality Date  . Tonsillectomy    . Nasal sinus surgery    . Wrist fracture surgery    . Replacement total knee      Family History  Problem Relation Age of Onset  . Diabetes Mother   . Heart attack Father   . Heart disease Sister     Social  History   Social History  . Marital Status: Married    Spouse Name: N/A  . Number of Children: 2  . Years of Education: N/A   Occupational History  .     Social History Main Topics  . Smoking status: Former Smoker -- 0.50 packs/day    Quit date: 12/12/2011  . Smokeless tobacco: Never Used  . Alcohol Use: No  . Drug Use: No  . Sexual Activity: Not on file   Other Topics Concern  . Not on file   Social History Narrative   The PMH, PSH, Social History, Family History, Medications, and allergies have been reviewed in [facility], and have been updated if relevant.  Review of Systems  Constitutional: Negative for fever.  Cardiovascular: Positive for leg swelling.  Gastrointestinal: Negative.   Skin: Positive for color change and rash.  Neurological: Negative.   Hematological: Negative.   All other systems reviewed and are negative.      Objective:     BP 136/80 mmHg  Pulse 104  Temp(Src) 98 F (36.7 C) (Oral)  Wt 246 lb 8 oz (111.812 kg)  SpO2 94%   Physical Exam  Constitutional: She is oriented to person, place, and time. She appears well-developed and well-nourished. No distress.  HENT:  Head: Normocephalic.  Eyes: Conjunctivae are normal.  Pulmonary/Chest: Effort normal.  Musculoskeletal:  2+ pitting pedal edema right with chronic bunion deformity 2+ left pitting pedal edema with warmth and erythema which, spreading to her ankle and leg  Neurological: She is alert and oriented to person, place, and time. No cranial nerve deficit.  Skin: She is not diaphoretic.  Psychiatric: She has a normal mood and affect. Her behavior is normal. Judgment and thought content normal.  Nursing note and vitals reviewed.         Assessment & Plan:   Leg edema, left No Follow-up on file.

## 2016-01-09 ENCOUNTER — Encounter: Payer: Self-pay | Admitting: Family Medicine

## 2016-01-09 ENCOUNTER — Ambulatory Visit (INDEPENDENT_AMBULATORY_CARE_PROVIDER_SITE_OTHER): Payer: 59 | Admitting: Family Medicine

## 2016-01-09 ENCOUNTER — Other Ambulatory Visit (INDEPENDENT_AMBULATORY_CARE_PROVIDER_SITE_OTHER): Payer: 59

## 2016-01-09 VITALS — BP 126/86 | HR 67 | Temp 98.2°F | Ht 67.0 in | Wt 243.5 lb

## 2016-01-09 DIAGNOSIS — E039 Hypothyroidism, unspecified: Secondary | ICD-10-CM

## 2016-01-09 DIAGNOSIS — L03116 Cellulitis of left lower limb: Secondary | ICD-10-CM

## 2016-01-09 DIAGNOSIS — R600 Localized edema: Secondary | ICD-10-CM | POA: Diagnosis not present

## 2016-01-09 DIAGNOSIS — I10 Essential (primary) hypertension: Secondary | ICD-10-CM

## 2016-01-09 MED ORDER — DOXYCYCLINE HYCLATE 100 MG PO TABS: 100.0000 mg | ORAL_TABLET | Freq: Two times a day (BID) | ORAL | Status: AC

## 2016-01-09 MED ORDER — MUPIROCIN 2 % EX OINT: 1.0000 "application " | TOPICAL_OINTMENT | Freq: Three times a day (TID) | CUTANEOUS | Status: DC

## 2016-01-09 NOTE — Addendum Note (Signed)
Addended by: Ellamae Sia on: 01/09/2016 04:14 PM   Modules accepted: Orders

## 2016-01-09 NOTE — Progress Notes (Signed)
Pre visit review using our clinic review tool, if applicable. No additional management support is needed unless otherwise documented below in the visit note. 

## 2016-01-09 NOTE — Patient Instructions (Signed)
Clean your foot with soap and water frequently  Elevate it when you can Continue doxycycline Use the bactroban ointment on the sore on your foot  (you can see if they have the foam type band aids at the pharmacy if you want to cover it)  If symptoms worsen-please let us know  Follow up early next week for a re check   Labs for thyroid today

## 2016-01-09 NOTE — Assessment & Plan Note (Signed)
Blood pressure is improved today  bp in fair control at this time  BP Readings from Last 1 Encounters:  01/09/16 126/86   No changes needed Disc lifstyle change with low sodium diet and exercise

## 2016-01-09 NOTE — Progress Notes (Signed)
Subjective:    Patient ID: [name], female    DOB: 04-01-1936, 80 y.o.   MRN: [id number]  HPI Here for f/u of cellulitis of L foot   On doxycycline - she was started on the 15th- doxycycline  Is getting a little better  Has a cut on it but not oozing  Toes are less swelling   Some pain -but much improved   Has been elevating the foot  Not using any compresses  Put neosporin on it - reacted to that (? Allergic to neosporin) Washing with soap and water   bp is improved  today  No cp or palpitations or headaches or edema  No side effects to medicines  BP Readings from Last 3 Encounters:  01/09/16 126/86  01/05/16 136/80  11/26/15 146/88     Due for thyroid test-was changed to levothyroxine last time Seems to be doing ok with that   Patient Active Problem List   Diagnosis Date Noted  . Cellulitis of leg, left 01/05/2016  . Pedal edema 11/27/2015  . MGUS (monoclonal gammopathy of unknown significance) 11/18/2015  . Liver lesion 11/18/2015  . Left foot pain 10/10/2015  . Leg edema, left 09/22/2015  . Acute joint pain 10/14/2014  . Right foot pain 08/05/2014  . Right shoulder pain 08/05/2014  . Microscopic hematuria 05/21/2014  . PMR (polymyalgia rheumatica) ([place]) 05/15/2014  . Elevated sed rate 04/30/2014  . UTI (urinary tract infection) 04/30/2014  . Other malaise and fatigue 04/17/2014  . Diverticulosis 04/17/2014  . Bilateral arm pain 04/17/2014  . Abdominal pain, LLQ 04/17/2014  . Former smoker 12/12/2012  . Muscle cramps 07/12/2012  . Depression 04/19/2011  . Pre-syncope 03/08/2011  . OBESITY 10/03/2007  . VERTIGO 04/11/2007  . Hypothyroidism 12/27/2006  . HYPERCHOLESTEROLEMIA 12/27/2006  . Essential hypertension 12/27/2006  . SVT (supraventricular tachycardia) ([place]) 12/27/2006  . ALLERGIC RHINITIS 12/27/2006  . COPD 12/27/2006  . OSTEOARTHRITIS 12/27/2006  . Chronic back pain 12/27/2006   Past Medical History  Diagnosis Date  .  Hypertension   . Hyperlipidemia   . PVC's (premature ventricular contractions)   . Tobacco dependence   . IBS (irritable bowel syndrome)   . Diverticulitis   . Palpitations   . Chronic pain     Managed by Dr. Hardin Negus  . SVT (supraventricular tachycardia) ([place])   . Polymyalgia rheumatica ([facility])    Past Surgical History  Procedure Laterality Date  . Tonsillectomy    . Nasal sinus surgery    . Wrist fracture surgery    . Replacement total knee     Social History  Substance Use Topics  . Smoking status: Former Smoker -- 0.50 packs/day    Quit date: 12/12/2011  . Smokeless tobacco: Never Used  . Alcohol Use: No   Family History  Problem Relation Age of Onset  . Diabetes Mother   . Heart attack Father   . Heart disease Sister    Allergies  Allergen Reactions  . Amoxicillin-Pot Clavulanate     REACTION: diarrhea  . Aspirin Nausea And Vomiting  . Atorvastatin     REACTION: feel weak  . Carisoprodol     REACTION: intolerant  . Codeine     REACTION: hallucinations  . Codeine Phosphate     REACTION: UNKNOWN  . Cortisone     REACTION: pt. reports allergy, reaction not known  . Ezetimibe-Simvastatin     REACTION: feels weak  . Naproxen Sodium     REACTION: GI symptoms  .  Omeprazole     REACTION: abd pain  . Quinapril Hcl     REACTION: reports allergy, reaction not known  . Rofecoxib     REACTION: reports allergy, reaction not known   Current Outpatient Prescriptions on File Prior to Visit  Medication Sig Dispense Refill  . levothyroxine (SYNTHROID, LEVOTHROID) 50 MCG tablet Take 1 tablet (50 mcg total) by mouth daily. 90 tablet 0  . meclizine (ANTIVERT) 25 MG tablet TAKE 1 TABLET (25 MG TOTAL) BY MOUTH 3 (THREE) TIMES DAILY AS NEEDED FOR DIZZINESS. 30 tablet 2  . metoprolol tartrate (LOPRESSOR) 25 MG tablet TAKE 1 TABLET (25 MG TOTAL) BY MOUTH 2 (TWO) TIMES DAILY. 180 tablet 0  . pantoprazole (PROTONIX) 40 MG tablet TAKE 1 TABLET (40 MG TOTAL) BY MOUTH DAILY. 90  tablet 3  . predniSONE (DELTASONE) 5 MG tablet TAKE 1 TABLET BY MOUTH EVERY DAY WITH 1 MG TABLET TOTAL 8MG   0  . sertraline (ZOLOFT) 50 MG tablet TAKE 1 TABLET BY MOUTH EVERY DAY 90 tablet 3  . spironolactone (ALDACTONE) 25 MG tablet TAKE 1 TABLET (25 MG TOTAL) BY MOUTH DAILY. 90 tablet 1  . Wheat Dextrin (BENEFIBER PO) Take by mouth 2 (two) times daily.     No current facility-administered medications on file prior to visit.     Review of Systems Review of Systems  Constitutional: Negative for fever, appetite change, fatigue and unexpected weight change.  Eyes: Negative for pain and visual disturbance.  Respiratory: Negative for cough and shortness of breath.  pos for pedal edema  Cardiovascular: Negative for cp or palpitations    Gastrointestinal: Negative for nausea, diarrhea and constipation.  Genitourinary: Negative for urgency and frequency.  Skin: Negative for pallor or rash  pos for redness of L lower leg and foot  Neurological: Negative for weakness, light-headedness, numbness and headaches.  Hematological: Negative for adenopathy. Does not bruise/bleed easily.  Psychiatric/Behavioral: Negative for dysphoric mood. The patient is not nervous/anxious.         Objective:   Physical Exam  Constitutional: She appears well-developed and well-nourished. No distress.  obese and well appearing   HENT:  Head: Normocephalic and atraumatic.  Eyes: Conjunctivae and EOM are normal. Pupils are equal, round, and reactive to light. No scleral icterus.  Neck: Normal range of motion. Neck supple. No thyromegaly present.  Cardiovascular: Normal rate and regular rhythm.   Pulmonary/Chest: Effort normal and breath sounds normal.  Musculoskeletal: She exhibits edema and tenderness.  L lower leg and foot are edematous and tender (improved from prior) slt warm  Sore with scab present on top of foot-no drainage  No oozing of leg or sore Nl perfusion  Venous insuff noted   Lymphadenopathy:     She has no cervical adenopathy.  Neurological: She is alert. She has normal reflexes.  No tremor   Skin: Skin is warm and dry. There is erythema.  Cellulitis of L leg/ foot with erythema to 1/2 way up lower leg and swelling  1 cm wound with scab top of L foot w/o drainage  Psychiatric: She has a normal mood and affect.          Assessment & Plan:   Problem List Items Addressed This Visit      Cardiovascular and Mediastinum   Essential hypertension    Blood pressure is improved today  bp in fair control at this time  BP Readings from Last 1 Encounters:  01/09/16 126/86   No changes needed Disc lifstyle change  with low sodium diet and exercise          Endocrine   Hypothyroidism - Primary    Now on levothyroxine since last visit instead of armour thyroid  No clinical changes  TSH today      Relevant Orders   TSH     Other   Pedal edema    Improved R leg With cellulitis-starting to also improve on L  Enc supp hose (pt cannot tolerate compression on infected leg yet) F/u for re check next week early      Cellulitis of leg, left    In pt with hx of lymphedema and on chronic prednisone  Improved from last visit on doxycycline -will extend this 10 more days  Now has scab on the top of her foot -without drainage (may have been original cause of cellulitis)  Px bactroban for this  inst to wear slippers that do not touch the area/dress loosely No more shoes with straps for now  F/u early next wk for another re check  Would like to wrap leg-pt states it is still too tender to tolerate that

## 2016-01-10 NOTE — Assessment & Plan Note (Signed)
In pt with hx of lymphedema and on chronic prednisone  Improved from last visit on doxycycline -will extend this 10 more days  Now has scab on the top of her foot -without drainage (may have been original cause of cellulitis)  Px bactroban for this  inst to wear slippers that do not touch the area/dress loosely No more shoes with straps for now  F/u early next wk for another re check  Would like to wrap leg-pt states it is still too tender to tolerate that

## 2016-01-10 NOTE — Assessment & Plan Note (Signed)
Now on levothyroxine since last visit instead of armour thyroid  No clinical changes  TSH today

## 2016-01-10 NOTE — Assessment & Plan Note (Signed)
Improved R leg With cellulitis-starting to also improve on L  Enc supp hose (pt cannot tolerate compression on infected leg yet) F/u for re check next week early

## 2016-01-13 ENCOUNTER — Ambulatory Visit (INDEPENDENT_AMBULATORY_CARE_PROVIDER_SITE_OTHER)
Admission: RE | Admit: 2016-01-13 | Discharge: 2016-01-13 | Disposition: A | Discharge status: Home / Self Care | Payer: 59 | Source: Ambulatory Visit | Attending: Family Medicine | Admitting: Family Medicine

## 2016-01-13 ENCOUNTER — Encounter: Payer: Self-pay | Admitting: Family Medicine

## 2016-01-13 ENCOUNTER — Telehealth: Payer: Self-pay | Admitting: Family Medicine

## 2016-01-13 ENCOUNTER — Ambulatory Visit (INDEPENDENT_AMBULATORY_CARE_PROVIDER_SITE_OTHER): Payer: 59 | Admitting: Family Medicine

## 2016-01-13 VITALS — BP 122/78 | HR 59 | Temp 97.6°F | Ht 67.0 in | Wt 241.5 lb

## 2016-01-13 DIAGNOSIS — L03116 Cellulitis of left lower limb: Secondary | ICD-10-CM

## 2016-01-13 DIAGNOSIS — L97521 Non-pressure chronic ulcer of other part of left foot limited to breakdown of skin: Secondary | ICD-10-CM

## 2016-01-13 DIAGNOSIS — M19072 Primary osteoarthritis, left ankle and foot: Secondary | ICD-10-CM | POA: Diagnosis not present

## 2016-01-13 NOTE — Assessment & Plan Note (Signed)
Erythema is regressing from ankle - reassuring xr today of foot to r/o osteomyelitis  Continue to care for ulcer Cannot wrap due to pain (would help for venous insuff) F/u Friday for re check Continue doxycycline and elevation

## 2016-01-13 NOTE — Progress Notes (Signed)
Pre visit review using our clinic review tool, if applicable. No additional management support is needed unless otherwise documented below in the visit note. 

## 2016-01-13 NOTE — Telephone Encounter (Signed)
Appt made at [facility] for 01/16/16. Patient aware.

## 2016-01-13 NOTE — Patient Instructions (Signed)
Continue your antibiotic  Continue elevating foot  Keep clean with soap and water  Use the bactroban ointment on any areas of open skin/wounds  Xray today to rule out any signs of bone infection  I would like to start wrapping your foot and ankle as soon as you can tolerate it  Follow up with me for a re check Friday if possible

## 2016-01-13 NOTE — Telephone Encounter (Signed)
Pt notified of xray results and Rosaria Ferries will call to schedule appt

## 2016-01-13 NOTE — Telephone Encounter (Signed)
This is a ref to wound care/urgent  Pt not aware yet -for [facility] to call regarding xray report  Thanks

## 2016-01-13 NOTE — Progress Notes (Signed)
Subjective:    Patient ID: [name], female    DOB: 1935/11/29, 80 y.o.   MRN: [id number]  HPI Here for f/u of cellulitis   Thinks she has improved a bit more  More sore in am-then improves  Wound on her foot has drained  A little less swollen   Avoiding strappy shoes or anything that rubs the wound Elevates foot all the time   Patient Active Problem List   Diagnosis Date Noted  . Cellulitis of leg, left 01/05/2016  . Pedal edema 11/27/2015  . MGUS (monoclonal gammopathy of unknown significance) 11/18/2015  . Liver lesion 11/18/2015  . Left foot pain 10/10/2015  . Leg edema, left 09/22/2015  . Acute joint pain 10/14/2014  . Right foot pain 08/05/2014  . Right shoulder pain 08/05/2014  . Microscopic hematuria 05/21/2014  . PMR (polymyalgia rheumatica) ([place]) 05/15/2014  . Elevated sed rate 04/30/2014  . UTI (urinary tract infection) 04/30/2014  . Other malaise and fatigue 04/17/2014  . Diverticulosis 04/17/2014  . Bilateral arm pain 04/17/2014  . Abdominal pain, LLQ 04/17/2014  . Former smoker 12/12/2012  . Muscle cramps 07/12/2012  . Depression 04/19/2011  . Pre-syncope 03/08/2011  . OBESITY 10/03/2007  . VERTIGO 04/11/2007  . Hypothyroidism 12/27/2006  . HYPERCHOLESTEROLEMIA 12/27/2006  . Essential hypertension 12/27/2006  . SVT (supraventricular tachycardia) ([place]) 12/27/2006  . ALLERGIC RHINITIS 12/27/2006  . COPD 12/27/2006  . OSTEOARTHRITIS 12/27/2006  . Chronic back pain 12/27/2006   Past Medical History  Diagnosis Date  . Hypertension   . Hyperlipidemia   . PVC's (premature ventricular contractions)   . Tobacco dependence   . IBS (irritable bowel syndrome)   . Diverticulitis   . Palpitations   . Chronic pain     Managed by Dr. Hardin Negus  . SVT (supraventricular tachycardia) ([place])   . Polymyalgia rheumatica ([facility])    Past Surgical History  Procedure Laterality Date  . Tonsillectomy    . Nasal sinus surgery    . Wrist fracture surgery      . Replacement total knee     Social History  Substance Use Topics  . Smoking status: Former Smoker -- 0.50 packs/day    Quit date: 12/12/2011  . Smokeless tobacco: Never Used  . Alcohol Use: No   Family History  Problem Relation Age of Onset  . Diabetes Mother   . Heart attack Father   . Heart disease Sister    Allergies  Allergen Reactions  . Amoxicillin-Pot Clavulanate     REACTION: diarrhea  . Aspirin Nausea And Vomiting  . Atorvastatin     REACTION: feel weak  . Carisoprodol     REACTION: intolerant  . Codeine     REACTION: hallucinations  . Codeine Phosphate     REACTION: UNKNOWN  . Cortisone     REACTION: pt. reports allergy, reaction not known  . Ezetimibe-Simvastatin     REACTION: feels weak  . Naproxen Sodium     REACTION: GI symptoms  . Omeprazole     REACTION: abd pain  . Quinapril Hcl     REACTION: reports allergy, reaction not known  . Rofecoxib     REACTION: reports allergy, reaction not known   Current Outpatient Prescriptions on File Prior to Visit  Medication Sig Dispense Refill  . doxycycline (VIBRA-TABS) 100 MG tablet Take 1 tablet (100 mg total) by mouth 2 (two) times daily. 20 tablet 0  . levothyroxine (SYNTHROID, LEVOTHROID) 50 MCG tablet Take 1 tablet (50 mcg  total) by mouth daily. 90 tablet 0  . meclizine (ANTIVERT) 25 MG tablet TAKE 1 TABLET (25 MG TOTAL) BY MOUTH 3 (THREE) TIMES DAILY AS NEEDED FOR DIZZINESS. 30 tablet 2  . metoprolol tartrate (LOPRESSOR) 25 MG tablet TAKE 1 TABLET (25 MG TOTAL) BY MOUTH 2 (TWO) TIMES DAILY. 180 tablet 0  . morphine (MS CONTIN) 15 MG 12 hr tablet Take 1 tablet by mouth daily.    [name] morphine (MS CONTIN) 30 MG 12 hr tablet Take 1 tablet by mouth daily.    . mupirocin ointment (BACTROBAN) 2 % Apply 1 application topically 3 (three) times daily. Apply to affected area (sore on your foot) three times daily 15 g 0  . pantoprazole (PROTONIX) 40 MG tablet TAKE 1 TABLET (40 MG TOTAL) BY MOUTH DAILY. 90 tablet 3  .  predniSONE (DELTASONE) 5 MG tablet TAKE 1 TABLET BY MOUTH EVERY DAY WITH 1 MG TABLET TOTAL 8MG   0  . sertraline (ZOLOFT) 50 MG tablet TAKE 1 TABLET BY MOUTH EVERY DAY 90 tablet 3  . spironolactone (ALDACTONE) 25 MG tablet TAKE 1 TABLET (25 MG TOTAL) BY MOUTH DAILY. 90 tablet 1  . Wheat Dextrin (BENEFIBER PO) Take by mouth 2 (two) times daily.     No current facility-administered medications on file prior to visit.      Review of Systems Review of Systems  Constitutional: Negative for fever, appetite change, fatigue and unexpected weight change.  Eyes: Negative for pain and visual disturbance.  Respiratory: Negative for cough and shortness of breath.   Cardiovascular: Negative for cp or palpitations    Gastrointestinal: Negative for nausea, diarrhea and constipation.  Genitourinary: Negative for urgency and frequency.  Skin: Negative for pallor or rash  pos for redness/swelling/ pain of L ankle/foot Neurological: Negative for weakness, light-headedness, numbness and headaches.  Hematological: Negative for adenopathy. Does not bruise/bleed easily.  Psychiatric/Behavioral: Negative for dysphoric mood. The patient is not nervous/anxious.         Objective:   Physical Exam  Constitutional: She appears well-developed and well-nourished. No distress.  obese and well appearing   Eyes: Conjunctivae and EOM are normal. Pupils are equal, round, and reactive to light.  Neck: Normal range of motion. Neck supple.  Cardiovascular: Normal rate and regular rhythm.   Pulmonary/Chest: Effort normal and breath sounds normal.  Lymphadenopathy:    She has no cervical adenopathy.  Neurological: She is alert.  Skin: Skin is warm and dry. There is erythema.  Cellulitis L ankle/foot -erythema is regressing from shin and ankle  Swelling is slt imp  1 cm wound/ superficial ulcer lateral dorsal L foot is about the same with scant yellow staining on band aid   Ankle and foot are tender but improved from  last visit   Psychiatric: She has a normal mood and affect.          Assessment & Plan:   Problem List Items Addressed This Visit      Musculoskeletal and Integument   Foot ulcer ([place]) - Primary    In setting of cellulitis and venous insufficiency  Started as area of shoe strap rubbing  No change today  xr foot to r/o osteomyelitis Pt cannot tolerate wrapping or stocking right now - hope to do this in the future Continue elevation/ doxycycline and bactroban ointment F/u later this wk for re check  Low threshold for wound care clinic ref if not improving       Relevant Orders   DG Foot  Complete Left (Completed)     Other   Cellulitis of leg, left    Erythema is regressing from ankle - reassuring xr today of foot to r/o osteomyelitis  Continue to care for ulcer Cannot wrap due to pain (would help for venous insuff) F/u Friday for re check Continue doxycycline and elevation       Relevant Orders   DG Foot Complete Left (Completed)

## 2016-01-13 NOTE — Assessment & Plan Note (Signed)
In setting of cellulitis and venous insufficiency  Started as area of shoe strap rubbing  No change today  xr foot to r/o osteomyelitis Pt cannot tolerate wrapping or stocking right now - hope to do this in the future Continue elevation/ doxycycline and bactroban ointment F/u later this wk for re check  Low threshold for wound care clinic ref if not improving

## 2016-01-16 ENCOUNTER — Ambulatory Visit: Payer: 59 | Admitting: Family Medicine

## 2016-01-16 ENCOUNTER — Encounter: Payer: 59 | Attending: Surgery | Admitting: Surgery

## 2016-01-16 DIAGNOSIS — Z6841 Body Mass Index (BMI) 40.0 and over, adult: Secondary | ICD-10-CM | POA: Diagnosis not present

## 2016-01-16 DIAGNOSIS — J449 Chronic obstructive pulmonary disease, unspecified: Secondary | ICD-10-CM | POA: Insufficient documentation

## 2016-01-16 DIAGNOSIS — Z87891 Personal history of nicotine dependence: Secondary | ICD-10-CM | POA: Insufficient documentation

## 2016-01-16 DIAGNOSIS — I10 Essential (primary) hypertension: Secondary | ICD-10-CM | POA: Insufficient documentation

## 2016-01-16 DIAGNOSIS — I890 Lymphedema, not elsewhere classified: Secondary | ICD-10-CM | POA: Insufficient documentation

## 2016-01-16 DIAGNOSIS — L97522 Non-pressure chronic ulcer of other part of left foot with fat layer exposed: Secondary | ICD-10-CM | POA: Insufficient documentation

## 2016-01-16 DIAGNOSIS — L03116 Cellulitis of left lower limb: Secondary | ICD-10-CM | POA: Diagnosis not present

## 2016-01-16 DIAGNOSIS — F329 Major depressive disorder, single episode, unspecified: Secondary | ICD-10-CM | POA: Diagnosis not present

## 2016-01-16 DIAGNOSIS — E669 Obesity, unspecified: Secondary | ICD-10-CM | POA: Insufficient documentation

## 2016-01-16 DIAGNOSIS — Z8249 Family history of ischemic heart disease and other diseases of the circulatory system: Secondary | ICD-10-CM | POA: Insufficient documentation

## 2016-01-16 DIAGNOSIS — M1990 Unspecified osteoarthritis, unspecified site: Secondary | ICD-10-CM | POA: Insufficient documentation

## 2016-01-16 DIAGNOSIS — E785 Hyperlipidemia, unspecified: Secondary | ICD-10-CM | POA: Diagnosis not present

## 2016-01-16 NOTE — Progress Notes (Addendum)
STAV, DELAROCA ([id number]) Visit Report for 01/16/2016 Chief Complaint Document Details Patient Name: [name], [name] Date of Service: 01/16/2016 1:45 PM Medical Record Number: [id number] Patient Account Number: [id number] Date of Birth/Sex: 10/14/1935 (79 y.o. Female) Treating RN: Carolyne Fiscal, Debi Primary Care Physician: [name] Other Clinician: Referring Physician: [name] Treating Physician/Extender: Frann Rider in Treatment: 0 Information Obtained from: Patient Chief Complaint Patient presents to the wound care center for a consult due non healing wound to the left foot with swelling of the left leg which has been quite significant since about 4 months. Electronic Signature(s) Signed: 01/16/2016 3:06:36 PM By: Christin Fudge MD, FACS Entered By: Christin Fudge on 01/16/2016 15:06:36 [name] ([id number]) -------------------------------------------------------------------------------- Debridement Details Patient Name: [name] Date of Service: 01/16/2016 1:45 PM Medical Record Number: [id number] Patient Account Number: [id number] Date of Birth/Sex: 30-Mar-1936 (79 y.o. Female) Treating RN: Carolyne Fiscal, Debi Primary Care Physician: [name] Other Clinician: Referring Physician: [name] Treating Physician/Extender: Frann Rider in Treatment: 0 Debridement Performed for Wound #1 Left,Proximal,Lateral Foot Assessment: Performed By: Physician Christin Fudge, MD Debridement: Debridement Pre-procedure Yes Verification/Time Out Taken: Start Time: 14:54 Pain Control: Lidocaine 4% Topical Solution Level: Skin/Subcutaneous Tissue Total Area Debrided (L x 0.7 (cm) x 1.2 (cm) = 0.84 (cm) W): Tissue and other Viable, Non-Viable, Exudate, Fibrin/Slough, Subcutaneous material debrided: Instrument: Forceps Bleeding: Minimum Hemostasis Achieved: Pressure End Time: 14:55 Procedural Pain: 0 Post Procedural Pain: 0 Response to  Treatment: Procedure was tolerated well Post Debridement Measurements of Total Wound Length: (cm) 0.7 Width: (cm) 1.2 Depth: (cm) 0.3 Volume: (cm) 0.198 Post Procedure Diagnosis Same as Pre-procedure Electronic Signature(s) Signed: 01/16/2016 3:06:06 PM By: Christin Fudge MD, FACS Signed: 01/16/2016 4:37:51 PM By: Alric Quan Entered By: Christin Fudge on 01/16/2016 15:06:06 [name] ([id number]) -------------------------------------------------------------------------------- HPI Details Patient Name: [name] Date of Service: 01/16/2016 1:45 PM Medical Record Number: [id number] Patient Account Number: [id number] Date of Birth/Sex: 02/12/1936 (79 y.o. Female) Treating RN: Carolyne Fiscal, Debi Primary Care Physician: [name] Other Clinician: Referring Physician: [name] Treating Physician/Extender: Frann Rider in Treatment: 0 History of Present Illness Location: swelling of the left lower extremity and a ulcerated area on her left foot dorsum Quality: Patient reports experiencing a dull pain to affected area(s). Severity: Patient states wound are getting worse. Duration: Patient has had the wound for < 2 weeks prior to presenting for treatment. the swelling of the leg has been there for about 4 months Timing: Pain in wound is Intermittent (comes and goes Context: The wound would happen gradually Modifying Factors: Other treatment(s) tried include:antibiotics and local care Associated Signs and Symptoms: Patient reports having increase swelling. HPI Description: 80 year old patient with a past medical history of essential hypertension, SVT, COPD, osteoarthritis, foot ulcer, obesity, vertigo,pedal edema, from a smoker who quit in 2013. she is also status post tonsillectomy, nasal surgery, wrist fracture surgery and total replacement of her knee. she was seen by her PCP recently Dr. Glori Bickers for cellulitis. She also has a left foot wound which is  draining fluid. She has recently been put on doxycycline and was applying some local antibiotic ointment. Recent left foot x-ray shows IMPRESSION:1. Diffuse prominent soft tissue swelling. Subcutaneous air along the dorsum of the foot cannot be excluded. This may be related to infection. 2.o Peripheral vascular disease.3. Diffuse degenerative change with prominent hallux valgus deformity first MTP joint again noted no acute or focal bony abnormality identified. Also a left lower extremity venous duplex  study was done 09/11/15 -- No evidence of left lower extremity deep or superficial venous thrombus or incompetence. Electronic Signature(s) Signed: 01/16/2016 3:08:05 PM By: Christin Fudge MD, FACS Previous Signature: 01/16/2016 1:04:44 PM Version By: Christin Fudge MD, FACS Entered By: Christin Fudge on 01/16/2016 15:08:05 [name] ([id number]) -------------------------------------------------------------------------------- Physical Exam Details Patient Name: [name] Date of Service: 01/16/2016 1:45 PM Medical Record Number: [id number] Patient Account Number: [id number] Date of Birth/Sex: 12-Dec-1935 (79 y.o. Female) Treating RN: Carolyne Fiscal, Debi Primary Care Physician: [name] Other Clinician: Referring Physician: [name] Treating Physician/Extender: Frann Rider in Treatment: 0 Constitutional . Pulse regular. Respirations normal and unlabored. Afebrile. . Eyes Nonicteric. Reactive to light. Ears, Nose, Mouth, and Throat Lips, teeth, and gums WNL.[name] Moist mucosa without lesions. Neck supple and nontender. No palpable supraclavicular or cervical adenopathy. Normal sized without goiter. Respiratory WNL. No retractions.. Breath sounds WNL, No rubs, rales, rhonchi, or wheeze.. Cardiovascular ABI on the left could not be measured on the right is 0.99. she has significant lymphedema stage II of the left lower extremity and the right is  minimal. Gastrointestinal (GI) Abdomen without masses or tenderness.. No liver or spleen enlargement or tenderness.. Lymphatic No adneopathy. No adenopathy. No adenopathy. Musculoskeletal Adexa without tenderness or enlargement.. Digits and nails w/o clubbing, cyanosis, infection, petechiae, ischemia, or inflammatory conditions.. Integumentary (Hair, Skin) No suspicious lesions. No crepitus or fluctuance. No peri-wound warmth or erythema. No masses.[name] Psychiatric Judgement and insight Intact.. No evidence of depression, anxiety, or agitation.. Notes she has got significant lymphedema of the left lower extremity with no evidence of significant cellulitis. Her ABI on this foot could not be measured On the dorsum of the left foot laterally she has a ulcerated area with some subcutaneous debris and this is a punched out ulcer which may have started as an insect bite. Electronic Signature(s) Signed: 01/16/2016 3:09:24 PM By: Christin Fudge MD, FACS Entered By: Christin Fudge on 01/16/2016 15:09:24 [name] ([id number]) -------------------------------------------------------------------------------- Physician Orders Details Patient Name: [name] Date of Service: 01/16/2016 1:45 PM Medical Record Number: [id number] Patient Account Number: [id number] Date of Birth/Sex: 1936/06/10 (79 y.o. Female) Treating RN: Carolyne Fiscal, Debi Primary Care Physician: [name] Other Clinician: Referring Physician: [name] Treating Physician/Extender: Frann Rider in Treatment: 0 Verbal / Phone Orders: Yes Clinician: Pinkerton, Debi Read Back and Verified: Yes Diagnosis Coding Wound Cleansing Wound #1 Left,Proximal,Lateral Foot o Clean wound with Normal Saline. Wound #2 Left,Distal,Lateral Foot o Clean wound with Normal Saline. Anesthetic Wound #1 Left,Proximal,Lateral Foot o Topical Lidocaine 4% cream applied to wound bed prior to debridement Wound #2  Left,Distal,Lateral Foot o Topical Lidocaine 4% cream applied to wound bed prior to debridement Primary Wound Dressing Wound #1 Left,Proximal,Lateral Foot o Medihoney gel Secondary Dressing Wound #1 Left,Proximal,Lateral Foot o ABD pad o Dry Gauze o Conform/Kerlix - netting, tape Wound #2 Left,Distal,Lateral Foot o ABD pad o Dry Gauze o Conform/Kerlix - netting, tape Dressing Change Frequency Wound #1 Left,Proximal,Lateral Foot o Change dressing every day. Wound #2 Left,Distal,Lateral Foot o Change dressing every day. [name], [name] ([id number]) Follow-up Appointments Wound #1 Left,Proximal,Lateral Foot o Return Appointment in 1 week. Wound #2 Left,Distal,Lateral Foot o Return Appointment in 1 week. Edema Control Wound #1 Left,Proximal,Lateral Foot o Elevate legs to the level of the heart and pump ankles as often as possible o Other: - pt to get 20-8mm compression stockings Wound #2 Left,Distal,Lateral Foot o Elevate legs to the level of the heart and pump ankles as  often as possible o Other: - pt to get 20-41mm compression stockings Additional Orders / Instructions Wound #1 Left,Proximal,Lateral Foot o Increase protein intake. Wound #2 Left,Distal,Lateral Foot o Increase protein intake. Services and Therapies o Venous Studies -Unilateral - left leg at Dr. Tyrell Antonio office o Arterial Studies- Unilateral - left leg at Dr. Tyrell Antonio office and ABI's bilateral legs Electronic Signature(s) Signed: 01/16/2016 4:37:51 PM By: Alric Quan Signed: 01/20/2016 3:49:12 PM By: Christin Fudge MD, FACS Previous Signature: 01/16/2016 4:01:52 PM Version By: Christin Fudge MD, FACS Entered By: Alric Quan on 01/16/2016 16:22:32 [name] ([id number]) -------------------------------------------------------------------------------- Problem List Details Patient Name: [name] Date of Service: 01/16/2016 1:45 PM Medical  Record Number: [id number] Patient Account Number: [id number] Date of Birth/Sex: 1936/02/26 (79 y.o. Female) Treating RN: Carolyne Fiscal, Debi Primary Care Physician: [name] Other Clinician: Referring Physician: [name] Treating Physician/Extender: Frann Rider in Treatment: 0 Active Problems ICD-10 Encounter Code Description Active Date Diagnosis I89.0 Lymphedema, not elsewhere classified 01/16/2016 Yes L97.522 Non-pressure chronic ulcer of other part of left foot with fat 01/16/2016 Yes layer exposed L03.116 Cellulitis of left lower limb 01/16/2016 Yes Inactive Problems Resolved Problems Electronic Signature(s) Signed: 01/16/2016 3:05:36 PM By: Christin Fudge MD, FACS Entered By: Christin Fudge on 01/16/2016 15:05:36 [name] ([id number]) -------------------------------------------------------------------------------- Progress Note Details Patient Name: [name] Date of Service: 01/16/2016 1:45 PM Medical Record Number: [id number] Patient Account Number: [id number] Date of Birth/Sex: [birth year] (79 y.o. Female) Treating RN: Carolyne Fiscal, Debi Primary Care Physician: [name] Other Clinician: Referring Physician: [name] Treating Physician/Extender: Frann Rider in Treatment: 0 Subjective Chief Complaint Information obtained from Patient Patient presents to the wound care center for a consult due non healing wound to the left foot with swelling of the left leg which has been quite significant since about 4 months. History of Present Illness (HPI) The following HPI elements were documented for the patient's wound: Location: swelling of the left lower extremity and a ulcerated area on her left foot dorsum Quality: Patient reports experiencing a dull pain to affected area(s). Severity: Patient states wound are getting worse. Duration: Patient has had the wound for < 2 weeks prior to presenting for treatment. the swelling of the leg has  been there for about 4 months Timing: Pain in wound is Intermittent (comes and goes Context: The wound would happen gradually Modifying Factors: Other treatment(s) tried include:antibiotics and local care Associated Signs and Symptoms: Patient reports having increase swelling. 80 year old patient with a past medical history of essential hypertension, SVT, COPD, osteoarthritis, foot ulcer, obesity, vertigo,pedal edema, from a smoker who quit in 2013. she is also status post tonsillectomy, nasal surgery, wrist fracture surgery and total replacement of her knee. she was seen by her PCP recently Dr. Glori Bickers for cellulitis. She also has a left foot wound which is draining fluid. She has recently been put on doxycycline and was applying some local antibiotic ointment. Recent left foot x-ray shows IMPRESSION:1. Diffuse prominent soft tissue swelling. Subcutaneous air along the dorsum of the foot cannot be excluded. This may be related to infection. 2. Peripheral vascular disease.3. Diffuse degenerative change with prominent hallux valgus deformity first MTP joint again noted no acute or focal bony abnormality identified. Also a left lower extremity venous duplex study was done 09/11/15 -- No evidence of left lower extremity deep or superficial venous thrombus or incompetence. Wound History Patient presents with 2 open wounds that have been present for approximately one week. Patient has been treating  wounds in the following manner: mupirocin. Laboratory tests have not been performed in the last month. Patient reportedly has not tested positive for an antibiotic resistant organism. Patient reportedly has not tested positive for osteomyelitis. Patient reportedly has not had testing performed to evaluate circulation in the legs. Patient experiences the following problems associated with their wounds: infection, swelling. [name], [name] ([id number]) Patient History Information obtained from  Patient. Allergies codeine (Severity: Severe, Reaction: hallucinate) Family History Hypertension - Mother, Siblings, Thyroid Problems - Mother, No family history of Cancer, Diabetes, Heart Disease, Hereditary Spherocytosis, Kidney Disease, Lung Disease, Seizures, Stroke, Tuberculosis. Social History Former smoker - quit 4 yrs ago smoked 30 yrs, Marital Status - Married, Alcohol Use - Never, Drug Use - No History, Caffeine Use - Never. Medical History Respiratory Patient has history of Chronic Obstructive Pulmonary Disease (COPD) Cardiovascular Patient has history of Hypertension Denies history of Arrhythmia Musculoskeletal Patient has history of Osteoarthritis Review of Systems (ROS) Constitutional Symptoms ([place]) The patient has no complaints or symptoms. Eyes Complains or has symptoms of Glasses / Contacts - glasses. Ear/Nose/Mouth/Throat vertigo Hematologic/Lymphatic The patient has no complaints or symptoms. Cardiovascular palpatations hyperlipidemia Gastrointestinal The patient has no complaints or symptoms. Endocrine Complains or has symptoms of Thyroid disease. Genitourinary The patient has no complaints or symptoms. Immunological Complains or has symptoms of Hives. Integumentary (Skin) Complains or has symptoms of Wounds. Neurologic The patient has no complaints or symptoms. Oncologic The patient has no complaints or symptoms. [name], [name] ([id number]) Psychiatric depression Medications; her medications include bacitracin ointment for the foot, doxycycline, morphine, prednisone, metoprolol, Synthroid, Aldactone, Zoloft. Objective Constitutional Pulse regular. Respirations normal and unlabored. Afebrile. Vitals Time Taken: 2:10 PM, Height: 67 in, Source: Stated, Weight: 260 lbs, Source: Stated, BMI: 40.7, Temperature: 98.2 F, Pulse: 57 bpm, Respiratory Rate: 18 breaths/min, Blood Pressure: 161/92 mmHg. Eyes Nonicteric. Reactive to  light. Ears, Nose, Mouth, and Throat Lips, teeth, and gums WNL.[name] Moist mucosa without lesions. Neck supple and nontender. No palpable supraclavicular or cervical adenopathy. Normal sized without goiter. Respiratory WNL. No retractions.. Breath sounds WNL, No rubs, rales, rhonchi, or wheeze.. Cardiovascular ABI on the left could not be measured on the right is 0.99. she has significant lymphedema stage II of the left lower extremity and the right is minimal. Gastrointestinal (GI) Abdomen without masses or tenderness.. No liver or spleen enlargement or tenderness.. Lymphatic No adneopathy. No adenopathy. No adenopathy. Musculoskeletal Adexa without tenderness or enlargement.. Digits and nails w/o clubbing, cyanosis, infection, petechiae, ischemia, or inflammatory conditions.[name] Psychiatric Judgement and insight Intact.. No evidence of depression, anxiety, or agitation.[name] [name], [name] ([id number]) General Notes: she has got significant lymphedema of the left lower extremity with no evidence of significant cellulitis. Her ABI on this foot could not be measured On the dorsum of the left foot laterally she has a ulcerated area with some subcutaneous debris and this is a punched out ulcer which may have started as an insect bite. Integumentary (Hair, Skin) No suspicious lesions. No crepitus or fluctuance. No peri-wound warmth or erythema. No masses.. Wound #1 status is Open. Original cause of wound was Gradually Appeared. The wound is located on the Left,Proximal,Lateral Foot. The wound measures 0.7cm length x 1.2cm width x 0.2cm depth; 0.66cm^2 area and 0.132cm^3 volume. The wound is limited to skin breakdown. There is no tunneling or undermining noted. There is a large amount of purulent drainage noted. The wound margin is thickened. There is no granulation within the wound bed. There is a large (  67-100%) amount of necrotic tissue within the wound bed including Adherent Slough. The  periwound skin appearance exhibited: Localized Edema, Moist, Erythema. The surrounding wound skin color is noted with erythema which is circumferential. Periwound temperature was noted as No Abnormality. The periwound has tenderness on palpation. Wound #2 status is Open. Original cause of wound was Gradually Appeared. The wound is located on the Left,Distal,Lateral Foot. The wound measures 0.2cm length x 0.2cm width x 0.1cm depth; 0.031cm^2 area and 0.003cm^3 volume. The wound is limited to skin breakdown. There is no tunneling or undermining noted. There is a large amount of serous drainage noted. The wound margin is flat and intact. There is large (67-100%) red granulation within the wound bed. There is no necrotic tissue within the wound bed. The periwound skin appearance exhibited: Localized Edema, Moist, Erythema. The surrounding wound skin color is noted with erythema which is circumferential. Periwound temperature was noted as No Abnormality. The periwound has tenderness on palpation. Assessment Active Problems ICD-10 I89.0 - Lymphedema, not elsewhere classified L97.522 - Non-pressure chronic ulcer of other part of left foot with fat layer exposed L03.116 - Cellulitis of left lower limb the patient is a poor historian but has developed a significant one-sided lymphedema which is worse on the left and has minimal lymphedema on the right. Her ulceration on the dorsum of the left foot with the cellulitis may have started as an insect bite. I have recommended: 1. Medihoney ointment to be applied locally daily with a bordered foam. Cuda, Ermalinda S. ([id number]) 2. Compression stockings of the 20-30 mm variety to be worn all day 3. Elevation and exercise. 4. arterial and venous duplex studies of both lower extremities. 5. continue to take her antibiotics as prescribed by her PCP 6. reviewing the wound care clinic next week Procedures Wound #1 Wound #1 is a Trauma, Other located on  the Left,Proximal,Lateral Foot . There was a Skin/Subcutaneous Tissue Debridement [id number]) debridement with total area of 0.84 sq cm performed by Christin Fudge, MD. with the following instrument(s): Forceps to remove Viable and Non-Viable tissue/material including Exudate, Fibrin/Slough, and Subcutaneous after achieving pain control using Lidocaine 4% Topical Solution. A time out was conducted prior to the start of the procedure. A Minimum amount of bleeding was controlled with Pressure. The procedure was tolerated well with a pain level of 0 throughout and a pain level of 0 following the procedure. Post Debridement Measurements: 0.7cm length x 1.2cm width x 0.3cm depth; 0.198cm^3 volume. Post procedure Diagnosis Wound #1: Same as Pre-Procedure Plan Wound Cleansing: Wound #1 Left,Proximal,Lateral Foot: Clean wound with Normal Saline. Wound #2 Left,Distal,Lateral Foot: Clean wound with Normal Saline. Anesthetic: Wound #1 Left,Proximal,Lateral Foot: Topical Lidocaine 4% cream applied to wound bed prior to debridement Wound #2 Left,Distal,Lateral Foot: Topical Lidocaine 4% cream applied to wound bed prior to debridement Primary Wound Dressing: Wound #1 Left,Proximal,Lateral Foot: Medihoney gel Secondary Dressing: Wound #1 Left,Proximal,Lateral Foot: ABD pad Dry Gauze Conform/Kerlix - netting, tape Wound #2 Left,Distal,Lateral Foot: ABD pad Dry Gauze [name], Lynn S. ([id number]) Conform/Kerlix - netting, tape Dressing Change Frequency: Wound #1 Left,Proximal,Lateral Foot: Change dressing every day. Wound #2 Left,Distal,Lateral Foot: Change dressing every day. Follow-up Appointments: Wound #1 Left,Proximal,Lateral Foot: Return Appointment in 1 week. Wound #2 Left,Distal,Lateral Foot: Return Appointment in 1 week. Edema Control: Wound #1 Left,Proximal,Lateral Foot: Elevate legs to the level of the heart and pump ankles as often as possible Other: - pt to get 20-44mm  compression stockings Wound #2 Left,Distal,Lateral Foot: Elevate legs to the  level of the heart and pump ankles as often as possible Other: - pt to get 20-54mm compression stockings Additional Orders / Instructions: Wound #1 Left,Proximal,Lateral Foot: Increase protein intake. Wound #2 Left,Distal,Lateral Foot: Increase protein intake. Services and Therapies ordered were: Venous Studies -Unilateral - left leg at Dr. Tyrell Antonio office, Arterial Studies- Unilateral - left leg at Dr. Tyrell Antonio office and ABI's bilateral legs the patient is a poor historian but has developed a significant one-sided lymphedema which is worse on the left and has minimal lymphedema on the right. Her ulceration on the dorsum of the left foot with the cellulitis may have started as an insect bite. I have recommended: 1. Medihoney ointment to be applied locally daily with a bordered foam. 2. Compression stockings of the 20-30 mm variety to be worn all day 3. Elevation and exercise. 4. arterial and venous duplex studies of both lower extremities. 5. continue to take her antibiotics as prescribed by her PCP 6. reviewing the wound care clinic next week Electronic Signature(s) JADYNE, DEBOARD ([id number]) Signed: 01/20/2016 3:50:40 PM By: Christin Fudge MD, FACS Previous Signature: 01/16/2016 3:15:07 PM Version By: Christin Fudge MD, FACS Entered By: Christin Fudge on 01/20/2016 15:50:40 [name] ([id number]) -------------------------------------------------------------------------------- ROS/PFSH Details Patient Name: [name] Date of Service: 01/16/2016 1:45 PM Medical Record Number: [id number] Patient Account Number: [id number] Date of Birth/Sex: 04/14/1936 (79 y.o. Female) Treating RN: Carolyne Fiscal, Debi Primary Care Physician: [name] Other Clinician: Referring Physician: [name] Treating Physician/Extender: Frann Rider in Treatment: 0 Information Obtained From Patient Wound  History Do you currently have one or more open woundso Yes How many open wounds do you currently haveo 2 Approximately how long have you had your woundso one week How have you been treating your wound(s) until nowo mupirocin Has your wound(s) ever healed and then re-openedo No Have you had any lab work done in the past montho No Have you tested positive for an antibiotic resistant organism (MRSA, VRE)o No Have you tested positive for osteomyelitis (bone infection)o No Have you had any tests for circulation on your legso No Have you had other problems associated with your woundso Infection, Swelling Eyes Complaints and Symptoms: Positive for: Glasses / Contacts - glasses Endocrine Complaints and Symptoms: Positive for: Thyroid disease Immunological Complaints and Symptoms: Positive for: Hives Integumentary (Skin) Complaints and Symptoms: Positive for: Wounds Constitutional Symptoms (General Health) Complaints and Symptoms: No Complaints or Symptoms Ear/Nose/Mouth/Throat Complaints and Symptoms: Review of System Notes: [name], [name] ([id number]) vertigo Hematologic/Lymphatic Complaints and Symptoms: No Complaints or Symptoms Respiratory Medical History: Positive for: Chronic Obstructive Pulmonary Disease (COPD) Cardiovascular Complaints and Symptoms: Review of System Notes: palpatations hyperlipidemia Medical History: Positive for: Hypertension Negative for: Arrhythmia Gastrointestinal Complaints and Symptoms: No Complaints or Symptoms Genitourinary Complaints and Symptoms: No Complaints or Symptoms Musculoskeletal Medical History: Positive for: Osteoarthritis Neurologic Complaints and Symptoms: No Complaints or Symptoms Oncologic Complaints and Symptoms: No Complaints or Symptoms Psychiatric Complaints and Symptoms: Review of System Notes: [name], [name] ([id number]) depression Family and Social History Cancer: No; Diabetes: No; Heart Disease:  No; Hereditary Spherocytosis: No; Hypertension: Yes - Mother, Siblings; Kidney Disease: No; Lung Disease: No; Seizures: No; Stroke: No; Thyroid Problems: Yes - Mother; Tuberculosis: No; Former smoker - quit 4 yrs ago smoked 30 yrs; Marital Status - Married; Alcohol Use: Never; Drug Use: No History; Caffeine Use: Never; Financial Concerns: No; Food, Clothing or Shelter Needs: No; Support System Lacking: No; Transportation Concerns: No; Advanced Directives: No; Patient does not want  information on Advanced Directives; Do not resuscitate: No; Living Will: No; Medical Power of Attorney: No Physician Affirmation I have reviewed and agree with the above information. Electronic Signature(s) Signed: 01/16/2016 2:43:13 PM By: Christin Fudge MD, FACS Signed: 01/16/2016 4:37:51 PM By: Alric Quan Entered By: Christin Fudge on 01/16/2016 14:43:13 [name] ([id number]) -------------------------------------------------------------------------------- SuperBill Details Patient Name: [name] Date of Service: 01/16/2016 Medical Record Number: [id number] Patient Account Number: [id number] Date of Birth/Sex: [birth year] (79 y.o. Female) Treating RN: Carolyne Fiscal, Debi Primary Care Physician: [name] Other Clinician: Referring Physician: [name] Treating Physician/Extender: Frann Rider in Treatment: 0 Diagnosis Coding ICD-10 Codes Code Description I89.0 Lymphedema, not elsewhere classified L97.522 Non-pressure chronic ulcer of other part of left foot with fat layer exposed L03.116 Cellulitis of left lower limb Facility Procedures CPT4 Code Description: [id number] - WOUND CARE VISIT-LEV 3 EST PT Modifier: Quantity: 1 CPT4 Code Description: [id number] - DEB SUBQ TISSUE 20 SQ CM/< ICD-10 Description Diagnosis I89.0 Lymphedema, not elsewhere classified L97.522 Non-pressure chronic ulcer of other part of left foot L03.116 Cellulitis of left lower limb Modifier:  with fat lay Quantity: 1 er exposed Physician Procedures CPT4 Code Description: [id number] - WC PHYS LEVEL 4 - NEW PT ICD-10 Description Diagnosis I89.0 Lymphedema, not elsewhere classified L97.522 Non-pressure chronic ulcer of other part of left foo L03.116 Cellulitis of left lower limb Modifier: 25 t with fat laye Quantity: 1 r exposed CPT4 Code Description: [id number] - WC PHYS SUBQ TISS 20 SQ CM ICD-10 Description Diagnosis I89.0 Lymphedema, not elsewhere classified L97.522 Non-pressure chronic ulcer of other part of left foo L03.116 Cellulitis of left lower limb [name], Lynn  S. ([id number]) Modifier: t with fat laye Quantity: 1 r exposed Electronic Signature(s) Signed: 01/16/2016 4:37:51 PM By: Alric Quan Signed: 01/20/2016 3:49:12 PM By: Christin Fudge MD, FACS Previous Signature: 01/16/2016 3:15:27 PM Version By: Christin Fudge MD, FACS Entered By: Alric Quan on 01/16/2016 16:17:29

## 2016-01-17 NOTE — Progress Notes (Signed)
[name], [name] ([id number]) Visit Report for 01/16/2016 Abuse/Suicide Risk Screen Details Patient Name: [name], [name] Date of Service: 01/16/2016 1:45 PM Medical Record Number: [id number] Patient Account Number: [id number] Date of Birth/Sex: [birth year] (79 y.o. Female) Treating RN: Carolyne Fiscal, Debi Primary Care Physician: [name] Other Clinician: Referring Physician: [name] Treating Physician/Extender: Frann Rider in Treatment: 0 Abuse/Suicide Risk Screen Items Answer ABUSE/SUICIDE RISK SCREEN: Has anyone close to you tried to hurt or harm you recentlyo No Do you feel uncomfortable with anyone in your familyo No Has anyone forced you do things that you didnot want to doo No Do you have any thoughts of harming yourselfo No Patient displays signs or symptoms of abuse and/or neglect. No Electronic Signature(s) Signed: 01/16/2016 4:37:51 PM By: Alric Quan Entered By: Alric Quan on 01/16/2016 14:21:30 [name] ([id number]) -------------------------------------------------------------------------------- Activities of Daily Living Details Patient Name: [name] Date of Service: 01/16/2016 1:45 PM Medical Record Number: [id number] Patient Account Number: [id number] Date of Birth/Sex: 04/18/1936 (79 y.o. Female) Treating RN: Carolyne Fiscal, Debi Primary Care Physician: [name] Other Clinician: Referring Physician: [name] Treating Physician/Extender: Frann Rider in Treatment: 0 Activities of Daily Living Items Answer Activities of Daily Living (Please select one for each item) Drive Automobile Completely Able Take Medications Completely Able Use Telephone Completely Able Care for Appearance Completely Able Use Toilet Completely Able Bath / Shower Completely Able Dress Self Completely Able Feed Self Completely Able Walk Completely Able Get In / Out Bed Completely Able Housework Completely Able Prepare Meals  Completely [place] for Self Completely Able Electronic Signature(s) Signed: 01/16/2016 4:37:51 PM By: Alric Quan Entered By: Alric Quan on 01/16/2016 14:22:19 [name] ([id number]) -------------------------------------------------------------------------------- Education Assessment Details Patient Name: [name] Date of Service: 01/16/2016 1:45 PM Medical Record Number: [id number] Patient Account Number: [id number] Date of Birth/Sex: 11/09/1935 (79 y.o. Female) Treating RN: Carolyne Fiscal, Debi Primary Care Physician: [name] Other Clinician: Referring Physician: [name] Treating Physician/Extender: Frann Rider in Treatment: 0 Primary Learner Assessed: Patient Learning Preferences/Education Level/Primary Language Learning Preference: Explanation, Printed Material Highest Education Level: High School Preferred Language: English Cognitive Barrier Assessment/Beliefs Language Barrier: No Translator Needed: No Memory Deficit: No Emotional Barrier: No Cultural/Religious Beliefs Affecting Medical No Care: Physical Barrier Assessment Impaired Vision: Yes Glasses Impaired Hearing: No Decreased Hand dexterity: No Knowledge/Comprehension Assessment Knowledge Level: High Comprehension Level: High Ability to understand written High instructions: Ability to understand verbal High instructions: Motivation Assessment Anxiety Level: Calm Cooperation: Cooperative Education Importance: Acknowledges Need Interest in Health Problems: Asks Questions Perception: Coherent Willingness to Engage in Self- High Management Activities: Readiness to Engage in Self- High Management Activities: Electronic Signature(s) [name], [name] ([id number]) Signed: 01/16/2016 4:37:51 PM By: Alric Quan Entered By: Alric Quan on 01/16/2016 14:22:41 [name]  ([id number]) -------------------------------------------------------------------------------- Fall Risk Assessment Details Patient Name: [name] Date of Service: 01/16/2016 1:45 PM Medical Record Number: [id number] Patient Account Number: [id number] Date of Birth/Sex: Dec 28, 1935 (79 y.o. Female) Treating RN: Carolyne Fiscal, Debi Primary Care Physician: [name] Other Clinician: Referring Physician: [name] Treating Physician/Extender: Frann Rider in Treatment: 0 Fall Risk Assessment Items Have you had 2 or more falls in the last 12 monthso 0 No Have you had any fall that resulted in injury in the last 12 monthso 0 No FALL RISK ASSESSMENT: History of falling - immediate or within 3 months 25 Yes Secondary diagnosis 0 No Ambulatory aid None/bed rest/wheelchair/nurse 0 No Crutches/cane/walker 0 No  Furniture 0 No IV Access/Saline Lock 0 No Gait/Training Normal/bed rest/immobile 0 No Weak 0 No Impaired 0 No Mental Status Oriented to own ability 0 Yes Electronic Signature(s) Signed: 01/16/2016 4:37:51 PM By: Alric Quan Entered By: Alric Quan on 01/16/2016 14:23:27 [name] ([id number]) -------------------------------------------------------------------------------- Nutrition Risk Assessment Details Patient Name: [name] Date of Service: 01/16/2016 1:45 PM Medical Record Number: [id number] Patient Account Number: [id number] Date of Birth/Sex: 09-23-1935 (79 y.o. Female) Treating RN: Carolyne Fiscal, Debi Primary Care Physician: [name] Other Clinician: Referring Physician: [name] Treating Physician/Extender: Frann Rider in Treatment: 0 Height (in): 67 Weight (lbs): 260 Body Mass Index (BMI): 40.7 Nutrition Risk Assessment Items NUTRITION RISK SCREEN: I have an illness or condition that made me change the kind and/or 2 Yes amount of food I eat I eat fewer than two meals per day 3 Yes I eat few fruits and  vegetables, or milk products 0 No I have three or more drinks of beer, liquor or wine almost every day 0 No I have tooth or mouth problems that make it hard for me to eat 0 No I don't always have enough money to buy the food I need 0 No I eat alone most of the time 0 No I take three or more different prescribed or over-the-counter drugs a 1 Yes day Without wanting to, I have lost or gained 10 pounds in the last six 0 No months I am not always physically able to shop, cook and/or feed myself 0 No Nutrition Protocols Good Risk Protocol Moderate Risk Protocol Electronic Signature(s) Signed: 01/16/2016 4:37:51 PM By: Alric Quan Entered By: Alric Quan on 01/16/2016 14:23:59

## 2016-01-17 NOTE — Progress Notes (Signed)
SONATA, HOCKLEY ([id number]) Visit Report for 01/16/2016 Allergy List Details Patient Name: HEMEN, BOARDLEY Date of Service: 01/16/2016 1:45 PM Medical Record Number: [id number] Patient Account Number: [id number] Date of Birth/Sex: 1935/11/22 (79 y.o. Female) Treating RN: Carolyne Fiscal, Debi Primary Care Physician: [name] Other Clinician: Referring Physician: [name] Treating Physician/Extender: Frann Rider in Treatment: 0 Allergies Active Allergies codeine Reaction: hallucinate Severity: Severe Allergy Notes Electronic Signature(s) Signed: 01/16/2016 4:37:51 PM By: Alric Quan Entered By: Alric Quan on 01/16/2016 14:11:57 [name] ([id number]) -------------------------------------------------------------------------------- Arrival Information Details Patient Name: [name] Date of Service: 01/16/2016 1:45 PM Medical Record Number: [id number] Patient Account Number: [id number] Date of Birth/Sex: 04-04-1936 (79 y.o. Female) Treating RN: Carolyne Fiscal, Debi Primary Care Physician: [name] Other Clinician: Referring Physician: [name] Treating Physician/Extender: Frann Rider in Treatment: 0 Visit Information Patient Arrived: Wheel Chair Arrival Time: 14:07 Accompanied By: husband Transfer Assistance: EasyPivot Patient Lift Patient Identification Verified: Yes Secondary Verification Process Yes Completed: Patient Requires Transmission- No Based Precautions: Patient Has Alerts: No Electronic Signature(s) Signed: 01/16/2016 4:37:51 PM By: Alric Quan Entered By: Alric Quan on 01/16/2016 14:09:36 [name] ([id number]) -------------------------------------------------------------------------------- Clinic Level of Care Assessment Details Patient Name: [name] Date of Service: 01/16/2016 1:45 PM Medical Record Number: [id number] Patient Account Number: [id number] Date of Birth/Sex:  August 01, 1936 (79 y.o. Female) Treating RN: Carolyne Fiscal, Debi Primary Care Physician: [name] Other Clinician: Referring Physician: [name] Treating Physician/Extender: Frann Rider in Treatment: 0 Clinic Level of Care Assessment Items TOOL 1 Quantity Score X - Use when EandM and Procedure is performed on INITIAL visit 1 0 ASSESSMENTS - Nursing Assessment / Reassessment X - General Physical Exam (combine w/ comprehensive assessment (listed just 1 20 below) when performed on new pt. evals) X - Comprehensive Assessment (HX, ROS, Risk Assessments, Wounds Hx, etc.) 1 25 ASSESSMENTS - Wound and Skin Assessment / Reassessment []  - Dermatologic / Skin Assessment (not related to wound area) 0 ASSESSMENTS - Ostomy and/or Continence Assessment and Care []  - Incontinence Assessment and Management 0 []  - Ostomy Care Assessment and Management (repouching, etc.) 0 PROCESS - Coordination of Care []  - Simple Patient / Family Education for ongoing care 0 X - Complex (extensive) Patient / Family Education for ongoing care 1 20 X - Staff obtains Programmer, systems, Records, Test Results / Process Orders 1 10 []  - Staff telephones HHA, Nursing Homes / Clarify orders / etc 0 []  - Routine Transfer to another Facility (non-emergent condition) 0 []  - Routine Hospital Admission (non-emergent condition) 0 X - New Admissions / Biomedical engineer / Ordering NPWT, Apligraf, etc. 1 15 []  - Emergency Hospital Admission (emergent condition) 0 PROCESS - Special Needs []  - Pediatric / Minor Patient Management 0 []  - Isolation Patient Management 0 MIYONA, HYMAN. ([id number]) []  - Hearing / Language / Visual special needs 0 []  - Assessment of Community assistance (transportation, D/C planning, etc.) 0 []  - Additional assistance / Altered mentation 0 []  - Support Surface(s) Assessment (bed, cushion, seat, etc.) 0 INTERVENTIONS - Miscellaneous []  - External ear exam 0 []  - Patient Transfer (multiple  staff / Civil Service fast streamer / Similar devices) 0 []  - Simple Staple / Suture removal (25 or less) 0 []  - Complex Staple / Suture removal (26 or more) 0 []  - Hypo/Hyperglycemic Management (do not check if billed separately) 0 X - Ankle / Brachial Index (ABI) - do not check if billed separately 1 15 Has the patient been seen at the hospital  within the last three years: Yes Total Score: 105 Level Of Care: New/Established - Level 3 Electronic Signature(s) Signed: 01/16/2016 4:37:51 PM By: Alric Quan Entered By: Alric Quan on 01/16/2016 16:17:21 [name] ([id number]) -------------------------------------------------------------------------------- Encounter Discharge Information Details Patient Name: [name] Date of Service: 01/16/2016 1:45 PM Medical Record Number: [id number] Patient Account Number: [id number] Date of Birth/Sex: 03/21/1936 (79 y.o. Female) Treating RN: Carolyne Fiscal, Debi Primary Care Physician: [name] Other Clinician: Referring Physician: [name] Treating Physician/Extender: Frann Rider in Treatment: 0 Encounter Discharge Information Items Discharge Pain Level: 0 Discharge Condition: Stable Ambulatory Status: Wheelchair Discharge Destination: Home Transportation: Private Auto Accompanied By: husband Schedule Follow-up Appointment: Yes Medication Reconciliation completed and provided to Patient/Care No Sincerity Cedar: Provided on Clinical Summary of Care: 01/16/2016 Form Type Recipient Paper Patient TM Electronic Signature(s) Signed: 01/16/2016 3:15:10 PM By: Ruthine Dose Entered By: Ruthine Dose on 01/16/2016 15:15:10 [name] ([id number]) -------------------------------------------------------------------------------- Lower Extremity Assessment Details Patient Name: [name] Date of Service: 01/16/2016 1:45 PM Medical Record Number: [id number] Patient Account Number: [id number] Date of Birth/Sex: [birth year]  (79 y.o. Female) Treating RN: Carolyne Fiscal, Debi Primary Care Physician: [name] Other Clinician: Referring Physician: [name] Treating Physician/Extender: Frann Rider in Treatment: 0 Edema Assessment Assessed: [Left: No] [Right: No] Edema: [Left: Yes] [Right: No] Calf Left: Right: Point of Measurement: 34 cm From Medial Instep 42.6 cm 36.5 cm Ankle Left: Right: Point of Measurement: 11 cm From Medial Instep 26.2 cm 20.5 cm Vascular Assessment Pulses: Posterior Tibial Dorsalis Pedis Palpable: [Left:No] [Right:No] Doppler: [Left:Multiphasic] [Right:Monophasic] Extremity colors, hair growth, and conditions: Extremity Color: [Left:Mottled] [Right:Red] Temperature of Extremity: [Left:Warm] [Right:Warm] Capillary Refill: [Left:< 3 seconds] [Right:> 3 seconds] Blood Pressure: Brachial: [Right:161] Dorsalis Pedis: [Left:Dorsalis Pedis: 160] Ankle: Posterior Tibial: [Left:Posterior Tibial:] [Right:0.99] Toe Nail Assessment Left: Right: Thick: Yes Yes Discolored: Yes Yes Deformed: Yes Yes Improper Length and Hygiene: No No Electronic Signature(s) Signed: 01/16/2016 4:37:51 PM By: Rico Sheehan, Gypsy Balsam ([id number]) Entered By: Alric Quan on 01/16/2016 14:34:44 [name] ([id number]) -------------------------------------------------------------------------------- Multi Wound Chart Details Patient Name: [name] Date of Service: 01/16/2016 1:45 PM Medical Record Number: [id number] Patient Account Number: [id number] Date of Birth/Sex: [birth year] (79 y.o. Female) Treating RN: Carolyne Fiscal, Debi Primary Care Physician: [name] Other Clinician: Referring Physician: [name] Treating Physician/Extender: Frann Rider in Treatment: 0 Vital Signs Height(in): 67 Pulse(bpm): 57 Weight(lbs): 260 Blood Pressure 161/92 (mmHg): Body Mass Index(BMI): 41 Temperature(F): 98.2 Respiratory  Rate 18 (breaths/min): Photos: [1:No Photos] [2:No Photos] [N/A:N/A] Wound Location: [1:Left Foot - Lateral, Proximal] [2:Left Foot - Lateral, Distal N/A] Wounding Event: [1:Gradually Appeared] [2:Gradually Appeared] [N/A:N/A] Primary Etiology: [1:Trauma, Other] [2:To be determined] [N/A:N/A] Comorbid History: [1:Chronic Obstructive Pulmonary Disease (COPD), Hypertension, Osteoarthritis] [2:Chronic Obstructive Pulmonary Disease (COPD), Hypertension, Osteoarthritis] [N/A:N/A] Date Acquired: [1:01/09/2016] [2:01/09/2016] [N/A:N/A] Weeks of Treatment: [1:0] [2:0] [N/A:N/A] Wound Status: [1:Open] [2:Open] [N/A:N/A] Measurements L x W x D 0.7x1.2x0.2 [2:0.2x0.2x0.1] [N/A:N/A] (cm) Area (cm) : [1:0.66] [2:0.031] [N/A:N/A] Volume (cm) : [1:0.132] [2:0.003] [N/A:N/A] Classification: [1:Partial Thickness] [2:Partial Thickness] [N/A:N/A] Exudate Amount: [1:Large] [2:Large] [N/A:N/A] Exudate Type: [1:Purulent] [2:Serous] [N/A:N/A] Exudate Color: [1:yellow, brown, green] [2:amber] [N/A:N/A] Wound Margin: [1:Thickened] [2:Flat and Intact] [N/A:N/A] Granulation Amount: [1:None Present (0%)] [2:Large (67-100%)] [N/A:N/A] Granulation Quality: [1:N/A] [2:Red] [N/A:N/A] Necrotic Amount: [1:Large (67-100%)] [2:None Present (0%)] [N/A:N/A] Exposed Structures: [1:Fascia: No Fat: No Tendon: No Muscle: No Joint: No Bone: No] [2:Fascia: No Fat: No Tendon: No Muscle: No Joint: No Bone: No] [N/A:N/A] Limited to Skin Limited to  Skin Breakdown Breakdown Epithelialization: None None N/A Debridement: Debridement [id number] N/A N/A [id number]) Time-Out Taken: Yes N/A N/A Pain Control: Lidocaine 4% Topical N/A N/A Solution Tissue Debrided: Fibrin/Slough, Exudates, N/A N/A Subcutaneous Level: Skin/Subcutaneous N/A N/A Tissue Debridement Area (sq 0.84 N/A N/A cm): Instrument: Forceps N/A N/A Bleeding: Minimum N/A N/A Hemostasis Achieved: Pressure N/A N/A Procedural Pain: 0 N/A N/A Post Procedural Pain: 0 N/A  N/A Debridement Treatment Procedure was tolerated N/A N/A Response: well Post Debridement 0.7x1.2x0.3 N/A N/A Measurements L x W x D (cm) Post Debridement 0.198 N/A N/A Volume: (cm) Periwound Skin Texture: Edema: Yes Edema: Yes N/A Periwound Skin Moist: Yes Moist: Yes N/A Moisture: Periwound Skin Color: Erythema: Yes Erythema: Yes N/A Erythema Location: Circumferential Circumferential N/A Temperature: No Abnormality No Abnormality N/A Tenderness on Yes Yes N/A Palpation: Wound Preparation: Ulcer Cleansing: Topical Anesthetic N/A Rinsed/Irrigated with Applied: Other: lidocaine Saline 4% Topical Anesthetic Applied: Other: lidocaine 4% Procedures Performed: Debridement N/A N/A Treatment Notes Wound #1 (Left, Proximal, Lateral Foot) 1. Cleansed with: Clean wound with Normal Saline 2. Anesthetic Topical Lidocaine 4% cream to wound bed prior to debridement Rogoff, Elesa S. ([id number]) 4. Dressing Applied: Medihoney Gel 5. Secondary Dressing Applied ABD Pad Dry Gauze Kerlix/Conform 7. Secured with Tape Notes netting Wound #2 (Left, Distal, Lateral Foot) 1. Cleansed with: Clean wound with Normal Saline 2. Anesthetic Topical Lidocaine 4% cream to wound bed prior to debridement 5. Secondary Dressing Applied ABD Pad Dry Gauze Kerlix/Conform 7. Secured with Tape Notes netting Electronic Signature(s) Signed: 01/16/2016 4:37:51 PM By: Alric Quan Entered By: Alric Quan on 01/16/2016 16:16:02 [name] ([id number]) -------------------------------------------------------------------------------- Multi-Disciplinary Care Plan Details Patient Name: [name] Date of Service: 01/16/2016 1:45 PM Medical Record Number: [id number] Patient Account Number: [id number] Date of Birth/Sex: [birth year] (79 y.o. Female) Treating RN: Carolyne Fiscal, Debi Primary Care Physician: [name] Other Clinician: Referring Physician: [name] Treating  Physician/Extender: Frann Rider in Treatment: 0 Active Inactive Abuse / Safety / Falls / Self Care Management Nursing Diagnoses: Potential for falls Goals: Patient will remain injury free Date Initiated: 01/16/2016 Goal Status: Active Interventions: Assess fall risk on admission and as needed Notes: Nutrition Nursing Diagnoses: Imbalanced nutrition Goals: Patient/caregiver agrees to and verbalizes understanding of need to use nutritional supplements and/or vitamins as prescribed Date Initiated: 01/16/2016 Goal Status: Active Interventions: Assess patient nutrition upon admission and as needed per policy Notes: Orientation to the Wound Care Program Nursing Diagnoses: Knowledge deficit related to the wound healing center program Goals: Patient/caregiver will verbalize understanding of the [place], Greidy S. ([id number]) Date Initiated: 01/16/2016 Goal Status: Active Interventions: Provide education on orientation to the wound center Notes: Pain, Acute or Chronic Nursing Diagnoses: Pain, acute or chronic: actual or potential Potential alteration in comfort, pain Goals: Patient will verbalize adequate pain control and receive pain control interventions during procedures as needed Date Initiated: 01/16/2016 Goal Status: Active Interventions: Assess comfort goal upon admission Complete pain assessment as per visit requirements Notes: Wound/Skin Impairment Nursing Diagnoses: Impaired tissue integrity Goals: Ulcer/skin breakdown will have a volume reduction of 30% by week 4 Date Initiated: 01/16/2016 Goal Status: Active Ulcer/skin breakdown will have a volume reduction of 50% by week 8 Date Initiated: 01/16/2016 Goal Status: Active Ulcer/skin breakdown will have a volume reduction of 80% by week 12 Date Initiated: 01/16/2016 Goal Status: Active Interventions: Assess patient/caregiver ability to obtain necessary supplies Assess  ulceration(s) every visit Notes: BARRY, AGOSTA ([id number]) Electronic Signature(s) Signed: 01/16/2016 4:37:51 PM By: Carolyne Fiscal,  Debra Entered By: Alric Quan on 01/16/2016 16:15:52 [name] ([id number]) -------------------------------------------------------------------------------- Pain Assessment Details Patient Name: MARGAREE, TRINKA Date of Service: 01/16/2016 1:45 PM Medical Record Number: [id number] Patient Account Number: [id number] Date of Birth/Sex: [birth year] (79 y.o. Female) Treating RN: Carolyne Fiscal, Debi Primary Care Physician: [name] Other Clinician: Referring Physician: [name] Treating Physician/Extender: Frann Rider in Treatment: 0 Active Problems Location of Pain Severity and Description of Pain Patient Has Paino Yes Site Locations Pain Location: Pain in Ulcers With Dressing Change: Yes Rate the pain. Current Pain Level: 4 Character of Pain Describe the Pain: Stabbing, Tender Pain Management and Medication Current Pain Management: Electronic Signature(s) Signed: 01/16/2016 4:37:51 PM By: Alric Quan Entered By: Alric Quan on 01/16/2016 14:10:27 [name] ([id number]) -------------------------------------------------------------------------------- Patient/Caregiver Education Details Patient Name: [name] Date of Service: 01/16/2016 1:45 PM Medical Record Number: [id number] Patient Account Number: [id number] Date of Birth/Gender: Jul 15, 1936 (79 y.o. Female) Treating RN: Carolyne Fiscal, Debi Primary Care Physician: [name] Other Clinician: Referring Physician: [name] Treating Physician/Extender: Frann Rider in Treatment: 0 Education Assessment Education Provided To: Patient Education Topics Provided Wound/Skin Impairment: Handouts: Other: change dressing as ordered Methods: Demonstration, Explain/Verbal Responses: State content correctly Electronic  Signature(s) Signed: 01/16/2016 4:37:51 PM By: Alric Quan Entered By: Alric Quan on 01/16/2016 15:03:06 [name] ([id number]) -------------------------------------------------------------------------------- Wound Assessment Details Patient Name: [name] Date of Service: 01/16/2016 1:45 PM Medical Record Number: [id number] Patient Account Number: [id number] Date of Birth/Sex: October 27, 1935 (79 y.o. Female) Treating RN: Carolyne Fiscal, Debi Primary Care Physician: [name] Other Clinician: Referring Physician: [name] Treating Physician/Extender: Frann Rider in Treatment: 0 Wound Status Wound Number: 1 Primary Trauma, Other Etiology: Wound Location: Left Foot - Lateral, Proximal Wound Open Wounding Event: Gradually Appeared Status: Date Acquired: 01/09/2016 Comorbid Chronic Obstructive Pulmonary Weeks Of Treatment: 0 History: Disease (COPD), Hypertension, Clustered Wound: No Osteoarthritis Photos Photo Uploaded By: Alric Quan on 01/16/2016 16:27:14 Wound Measurements Length: (cm) 0.7 Width: (cm) 1.2 Depth: (cm) 0.2 Area: (cm) 0.66 Volume: (cm) 0.132 % Reduction in Area: % Reduction in Volume: Epithelialization: None Tunneling: No Undermining: No Wound Description Classification: Partial Thickness Wound Margin: Thickened Exudate Amount: Large Exudate Type: Purulent Exudate Color: yellow, brown, green Foul Odor After Cleansing: No Wound Bed Granulation Amount: None Present (0%) Exposed Structure Necrotic Amount: Large (67-100%) Fascia Exposed: No Necrotic Quality: Adherent Slough Fat Layer Exposed: No Tendon Exposed: No [place], Charmin S. ([id number]) Muscle Exposed: No Joint Exposed: No Bone Exposed: No Limited to Skin Breakdown Periwound Skin Texture Texture Color No Abnormalities Noted: No No Abnormalities Noted: No Localized Edema: Yes Erythema: Yes Erythema Location: Circumferential Moisture No  Abnormalities Noted: No Temperature / Pain Moist: Yes Temperature: No Abnormality Tenderness on Palpation: Yes Wound Preparation Ulcer Cleansing: Rinsed/Irrigated with Saline Topical Anesthetic Applied: Other: lidocaine 4%, Treatment Notes Wound #1 (Left, Proximal, Lateral Foot) 1. Cleansed with: Clean wound with Normal Saline 2. Anesthetic Topical Lidocaine 4% cream to wound bed prior to debridement 4. Dressing Applied: Medihoney Gel 5. Secondary Dressing Applied ABD Pad Dry Gauze Kerlix/Conform 7. Secured with Tape Notes netting Electronic Signature(s) Signed: 01/16/2016 4:37:51 PM By: Alric Quan Entered By: Alric Quan on 01/16/2016 14:38:29 [name] ([id number]) -------------------------------------------------------------------------------- Wound Assessment Details Patient Name: [name] Date of Service: 01/16/2016 1:45 PM Medical Record Number: [id number] Patient Account Number: [id number] Date of Birth/Sex: August 05, 1936 (79 y.o. Female) Treating RN: Carolyne Fiscal, Debi Primary Care Physician: [name] Other Clinician: Referring Physician: [name] Treating Physician/Extender:  Britto, Errol Weeks in Treatment: 0 Wound Status Wound Number: 2 Primary To be determined Etiology: Wound Location: Left Foot - Lateral, Distal Wound Open Wounding Event: Gradually Appeared Status: Date Acquired: 01/09/2016 Comorbid Chronic Obstructive Pulmonary Weeks Of Treatment: 0 History: Disease (COPD), Hypertension, Clustered Wound: No Osteoarthritis Photos Photo Uploaded By: Alric Quan on 01/16/2016 16:27:26 Wound Measurements Length: (cm) 0.2 Width: (cm) 0.2 Depth: (cm) 0.1 Area: (cm) 0.031 Volume: (cm) 0.003 % Reduction in Area: % Reduction in Volume: Epithelialization: None Tunneling: No Undermining: No Wound Description Classification: Partial Thickness Wound Margin: Flat and Intact Exudate Amount: Large Exudate Type:  Serous Exudate Color: amber Foul Odor After Cleansing: No Wound Bed Granulation Amount: Large (67-100%) Exposed Structure Granulation Quality: Red Fascia Exposed: No Necrotic Amount: None Present (0%) Fat Layer Exposed: No Tendon Exposed: No Freiberger, Honesty S. ([id number]) Muscle Exposed: No Joint Exposed: No Bone Exposed: No Limited to Skin Breakdown Periwound Skin Texture Texture Color No Abnormalities Noted: No No Abnormalities Noted: No Localized Edema: Yes Erythema: Yes Erythema Location: Circumferential Moisture No Abnormalities Noted: No Temperature / Pain Moist: Yes Temperature: No Abnormality Tenderness on Palpation: Yes Wound Preparation Topical Anesthetic Applied: Other: lidocaine 4%, Treatment Notes Wound #2 (Left, Distal, Lateral Foot) 1. Cleansed with: Clean wound with Normal Saline 2. Anesthetic Topical Lidocaine 4% cream to wound bed prior to debridement 5. Secondary Dressing Applied ABD Pad Dry Gauze Kerlix/Conform 7. Secured with Tape Notes netting Electronic Signature(s) Signed: 01/16/2016 4:37:51 PM By: Alric Quan Entered By: Alric Quan on 01/16/2016 14:39:50 [name] ([id number]) -------------------------------------------------------------------------------- [place] Details Patient Name: [name] Date of Service: 01/16/2016 1:45 PM Medical Record Number: [id number] Patient Account Number: [id number] Date of Birth/Sex: April 17, 1936 (79 y.o. Female) Treating RN: Carolyne Fiscal, Debi Primary Care Physician: [name] Other Clinician: Referring Physician: [name] Treating Physician/Extender: Frann Rider in Treatment: 0 Vital Signs Time Taken: 14:10 Temperature (F): 98.2 Height (in): 67 Pulse (bpm): 57 Source: Stated Respiratory Rate (breaths/min): 18 Weight (lbs): 260 Blood Pressure (mmHg): 161/92 Source: Stated Reference Range: 80 - 120 mg / dl Body Mass Index (BMI): 40.7 Electronic  Signature(s) Signed: 01/16/2016 4:37:51 PM By: Alric Quan Entered By: Alric Quan on 01/16/2016 14:11:05

## 2016-01-22 ENCOUNTER — Other Ambulatory Visit: Payer: Self-pay | Admitting: Family Medicine

## 2016-01-22 NOTE — Telephone Encounter (Signed)
Pt called to get refill for abx; pt was seen at wound care and advised to continue abx; advised pt to contact wound care about abx refill. Pt voiced understanding and will cb if needed.

## 2016-01-22 NOTE — Telephone Encounter (Signed)
Pt called wound center and they told her not to take abx until the doctor sees her tomorrow, she said they want to look at wound again and decide if she needs to be on this abx or another, pt will keep appt with wound center tomorrow, Rx declined and pt aware

## 2016-01-22 NOTE — Telephone Encounter (Signed)
Ok to refill times one if the wound center has not already

## 2016-01-22 NOTE — Telephone Encounter (Signed)
Please refill doxycycline if needed 100 mg bid #20 no ref  thanks

## 2016-01-22 NOTE — Telephone Encounter (Signed)
See phone note, pt request refill request, pt was advise by Rena to contact the wound center, please advise

## 2016-01-22 NOTE — Telephone Encounter (Signed)
See prev note, received abx refill request (I routed it to you) please advise

## 2016-01-23 ENCOUNTER — Encounter: Payer: 59 | Attending: Surgery | Admitting: Surgery

## 2016-01-23 DIAGNOSIS — J449 Chronic obstructive pulmonary disease, unspecified: Secondary | ICD-10-CM | POA: Insufficient documentation

## 2016-01-23 DIAGNOSIS — I890 Lymphedema, not elsewhere classified: Secondary | ICD-10-CM | POA: Diagnosis not present

## 2016-01-23 DIAGNOSIS — Z6841 Body Mass Index (BMI) 40.0 and over, adult: Secondary | ICD-10-CM | POA: Insufficient documentation

## 2016-01-23 DIAGNOSIS — I10 Essential (primary) hypertension: Secondary | ICD-10-CM | POA: Diagnosis not present

## 2016-01-23 DIAGNOSIS — E669 Obesity, unspecified: Secondary | ICD-10-CM | POA: Diagnosis not present

## 2016-01-23 DIAGNOSIS — R42 Dizziness and giddiness: Secondary | ICD-10-CM | POA: Insufficient documentation

## 2016-01-23 DIAGNOSIS — Z87891 Personal history of nicotine dependence: Secondary | ICD-10-CM | POA: Diagnosis not present

## 2016-01-23 DIAGNOSIS — L03116 Cellulitis of left lower limb: Secondary | ICD-10-CM | POA: Insufficient documentation

## 2016-01-23 DIAGNOSIS — L97522 Non-pressure chronic ulcer of other part of left foot with fat layer exposed: Secondary | ICD-10-CM | POA: Diagnosis not present

## 2016-01-27 ENCOUNTER — Other Ambulatory Visit: Payer: Self-pay | Admitting: Family Medicine

## 2016-01-27 DIAGNOSIS — I739 Peripheral vascular disease, unspecified: Secondary | ICD-10-CM

## 2016-01-30 ENCOUNTER — Encounter: Payer: 59 | Admitting: Surgery

## 2016-01-30 DIAGNOSIS — I890 Lymphedema, not elsewhere classified: Secondary | ICD-10-CM | POA: Diagnosis not present

## 2016-01-31 NOTE — Progress Notes (Signed)
[name], [name] ([id number]) Visit Report for 01/30/2016 Arrival Information Details Patient Name: [name], [name] Date of Service: 01/30/2016 3:15 PM Medical Record Number: [id number] Patient Account Number: [id number] Date of Birth/Sex: September 16, 1935 (80 y.o. Female) Treating RN: Carolyne Fiscal, Debi Primary Care Physician: [name] Other Clinician: Referring Physician: [name] Treating Physician/Extender: Frann Rider in Treatment: 2 Visit Information History Since Last Visit All ordered tests and consults were completed: No Patient Arrived: [name] Added or deleted any medications: No Arrival Time: 15:35 Any new allergies or adverse reactions: No Accompanied By: sister Had a fall or experienced change in No Transfer Assistance: Easy[employer] activities of daily living that may affect Patient Lift risk of falls: Patient Identification Verified: Yes Signs or symptoms of abuse/neglect since last No Secondary Verification Process Yes visito Completed: Hospitalized since last visit: No Patient Requires Transmission- No Pain Present Now: No Based Precautions: Patient Has Alerts: No Electronic Signature(s) Signed: 01/30/2016 4:33:34 PM By: Alric Quan Entered By: Alric Quan on 01/30/2016 15:35:27 [name] ([id number]) -------------------------------------------------------------------------------- Clinic Level of Care Assessment Details Patient Name: [name] Date of Service: 01/30/2016 3:15 PM Medical Record Number: [id number] Patient Account Number: [id number] Date of Birth/Sex: 20-Dec-1935 (80 y.o. Female) Treating RN: Carolyne Fiscal, Debi Primary Care Physician: [name] Other Clinician: Referring Physician: [name] Treating Physician/Extender: Frann Rider in Treatment: 2 Clinic Level of Care Assessment Items TOOL 4 Quantity Score X - Use when only an EandM is performed on FOLLOW-UP visit 1 0 ASSESSMENTS - Nursing  Assessment / Reassessment X - Reassessment of Co-morbidities (includes updates in patient status) 1 10 X - Reassessment of Adherence to Treatment Plan 1 5 ASSESSMENTS - Wound and Skin Assessment / Reassessment []  - Simple Wound Assessment / Reassessment - one wound 0 X - Complex Wound Assessment / Reassessment - multiple wounds 2 5 []  - Dermatologic / Skin Assessment (not related to wound area) 0 ASSESSMENTS - Focused Assessment []  - Circumferential Edema Measurements - multi extremities 0 []  - Nutritional Assessment / Counseling / Intervention 0 []  - Lower Extremity Assessment (monofilament, tuning fork, pulses) 0 []  - Peripheral Arterial Disease Assessment (using hand held doppler) 0 ASSESSMENTS - Ostomy and/or Continence Assessment and Care []  - Incontinence Assessment and Management 0 []  - Ostomy Care Assessment and Management (repouching, etc.) 0 PROCESS - Coordination of Care X - Simple Patient / Family Education for ongoing care 1 15 []  - Complex (extensive) Patient / Family Education for ongoing care 0 []  - Staff obtains Programmer, systems, Records, Test Results / Process Orders 0 []  - Staff telephones HHA, Nursing Homes / Clarify orders / etc 0 []  - Routine Transfer to another Facility (non-emergent condition) 0 Hunter, Lynn S. ([id number]) []  - Routine Hospital Admission (non-emergent condition) 0 []  - New Admissions / Biomedical engineer / Ordering NPWT, Apligraf, etc. 0 []  - Emergency Hospital Admission (emergent condition) 0 X - Simple Discharge Coordination 1 10 []  - Complex (extensive) Discharge Coordination 0 PROCESS - Special Needs []  - Pediatric / Minor Patient Management 0 []  - Isolation Patient Management 0 []  - Hearing / Language / Visual special needs 0 []  - Assessment of Community assistance (transportation, D/C planning, etc.) 0 []  - Additional assistance / Altered mentation 0 []  - Support Surface(s) Assessment (bed, cushion, seat, etc.) 0 INTERVENTIONS - Wound  Cleansing / Measurement X - Simple Wound Cleansing - one wound 1 5 []  - Complex Wound Cleansing - multiple wounds 0 X - Wound Imaging (photographs - any number of wounds)  1 5 []  - Wound Tracing (instead of photographs) 0 X - Simple Wound Measurement - one wound 1 5 []  - Complex Wound Measurement - multiple wounds 0 INTERVENTIONS - Wound Dressings []  - Small Wound Dressing one or multiple wounds 0 X - Medium Wound Dressing one or multiple wounds 1 15 []  - Large Wound Dressing one or multiple wounds 0 X - Application of Medications - topical 1 5 []  - Application of Medications - injection 0 INTERVENTIONS - Miscellaneous []  - External ear exam 0 [name], Lynn S. ([id number]) []  - Specimen Collection (cultures, biopsies, blood, body fluids, etc.) 0 []  - Specimen(s) / Culture(s) sent or taken to Lab for analysis 0 []  - Patient Transfer (multiple staff / Harrel Lemon Lift / Similar devices) 0 []  - Simple Staple / Suture removal (25 or less) 0 []  - Complex Staple / Suture removal (26 or more) 0 []  - Hypo / Hyperglycemic Management (close monitor of Blood Glucose) 0 []  - Ankle / Brachial Index (ABI) - do not check if billed separately 0 X - Vital Signs 1 5 Has the patient been seen at the hospital within the last three years: Yes Total Score: 90 Level Of Care: New/Established - Level 3 Electronic Signature(s) Signed: 01/30/2016 4:33:34 PM By: Alric Quan Entered By: Alric Quan on 01/30/2016 16:18:29 [name] ([id number]) -------------------------------------------------------------------------------- Encounter Discharge Information Details Patient Name: [name] Date of Service: 01/30/2016 3:15 PM Medical Record Number: [id number] Patient Account Number: [id number] Date of Birth/Sex: Nov 16, 1935 (80 y.o. Female) Treating RN: Carolyne Fiscal, Debi Primary Care Physician: [name] Other Clinician: Referring Physician: [name] Treating Physician/Extender: Frann Rider in Treatment: 2 Encounter Discharge Information Items Discharge Pain Level: 0 Discharge Condition: Stable Ambulatory Status: Cane Discharge Destination: Home Transportation: Private Auto Accompanied By: sister Schedule Follow-up Appointment: Yes Medication Reconciliation completed Yes and provided to Patient/Care Aracelli Woloszyn: Provided on Clinical Summary of Care: 01/30/2016 Form Type Recipient Paper Patient TM Electronic Signature(s) Signed: 01/30/2016 4:04:55 PM By: Ruthine Dose Entered By: Ruthine Dose on 01/30/2016 16:04:55 [name] ([id number]) -------------------------------------------------------------------------------- Lower Extremity Assessment Details Patient Name: [name] Date of Service: 01/30/2016 3:15 PM Medical Record Number: [id number] Patient Account Number: [id number] Date of Birth/Sex: 10/30/1935 (79 y.o. Female) Treating RN: Carolyne Fiscal, Debi Primary Care Physician: [name] Other Clinician: Referring Physician: [name] Treating Physician/Extender: Frann Rider in Treatment: 2 Vascular Assessment Pulses: Posterior Tibial Dorsalis Pedis Palpable: [Left:No] Doppler: [Left:Multiphasic] Extremity colors, hair growth, and conditions: Extremity Color: [Left:Mottled] Temperature of Extremity: [Left:Warm] Electronic Signature(s) Signed: 01/30/2016 4:33:34 PM By: Alric Quan Entered By: Alric Quan on 01/30/2016 15:42:50 [name] ([id number]) -------------------------------------------------------------------------------- Multi Wound Chart Details Patient Name: [name] Date of Service: 01/30/2016 3:15 PM Medical Record Number: [id number] Patient Account Number: [id number] Date of Birth/Sex: May 16, 1936 (79 y.o. Female) Treating RN: Carolyne Fiscal, Debi Primary Care Physician: [name] Other Clinician: Referring Physician: [name] Treating Physician/Extender: Frann Rider in  Treatment: 2 Vital Signs Height(in): 67 Pulse(bpm): 52 Weight(lbs): 260 Blood Pressure 154/78 (mmHg): Body Mass Index(BMI): 41 Temperature(F): 97.9 Respiratory Rate 18 (breaths/min): Photos: [1:No Photos] [2:No Photos] [N/A:N/A] Wound Location: [1:Left Foot - Lateral, Proximal] [2:Left Foot - Lateral, Distal N/A] Wounding Event: [1:Gradually Appeared] [2:Gradually Appeared] [N/A:N/A] Primary Etiology: [1:Trauma, Other] [2:Trauma, Other] [N/A:N/A] Comorbid History: [1:Chronic Obstructive Pulmonary Disease (COPD), Hypertension, Osteoarthritis] [2:Chronic Obstructive Pulmonary Disease (COPD), Hypertension, Osteoarthritis] [N/A:N/A] Date Acquired: [1:01/09/2016] [2:01/09/2016] [N/A:N/A] Weeks of Treatment: [1:2] [2:2] [N/A:N/A] Wound Status: [1:Open] [2:Healed - Epithelialized] [N/A:N/A] Measurements L x  W x D 0.5x0.8x0.5 [2:0x0x0] [N/A:N/A] (cm) Area (cm) : [1:0.314] [2:0] [N/A:N/A] Volume (cm) : [1:0.157] [2:0] [N/A:N/A] % Reduction in Area: [1:52.40%] [2:100.00%] [N/A:N/A] % Reduction in Volume: -18.90% [2:100.00%] [N/A:N/A] Starting Position 1 7 (o'clock): Ending Position 1 [1:2] (o'clock): Maximum Distance 1 0.6 (cm): Undermining: [1:Yes] [2:N/A] [N/A:N/A] Classification: [1:Partial Thickness] [2:Partial Thickness] [N/A:N/A] Exudate Amount: [1:Large] [2:Large] [N/A:N/A] Exudate Type: [1:Serous] [2:Serous] [N/A:N/A] Exudate Color: [1:amber] [2:amber] [N/A:N/A] Wound Margin: [1:Thickened] [2:Flat and Intact] [N/A:N/A] Granulation Amount: None Present (0%) Large (67-100%) N/A Granulation Quality: N/A Red N/A Necrotic Amount: Large (67-100%) None Present (0%) N/A Exposed Structures: Fascia: No Fascia: No N/A Fat: No Fat: No Tendon: No Tendon: No Muscle: No Muscle: No Joint: No Joint: No Bone: No Bone: No Limited to Skin Limited to Skin Breakdown Breakdown Epithelialization: Small (1-33%) Small (1-33%) N/A Periwound Skin Texture: Edema: Yes Edema: Yes  N/A Periwound Skin Moist: Yes Moist: Yes N/A Moisture: Periwound Skin Color: Erythema: Yes Erythema: Yes N/A Erythema Location: Circumferential Circumferential N/A Temperature: No Abnormality No Abnormality N/A Tenderness on Yes Yes N/A Palpation: Wound Preparation: Ulcer Cleansing: Ulcer Cleansing: N/A Rinsed/Irrigated with Rinsed/Irrigated with Saline Saline Topical Anesthetic Topical Anesthetic Applied: Other: lidocaine Applied: None 4% Treatment Notes Electronic Signature(s) Signed: 01/30/2016 4:33:34 PM By: Alric Quan Entered By: Alric Quan on 01/30/2016 15:49:34 [name] ([id number]) -------------------------------------------------------------------------------- Multi-Disciplinary Care Plan Details Patient Name: [name] Date of Service: 01/30/2016 3:15 PM Medical Record Number: [id number] Patient Account Number: [id number] Date of Birth/Sex: Apr 15, 1936 (79 y.o. Female) Treating RN: Carolyne Fiscal, Debi Primary Care Physician: [name] Other Clinician: Referring Physician: [name] Treating Physician/Extender: Frann Rider in Treatment: 2 Active Inactive Abuse / Safety / Falls / Self Care Management Nursing Diagnoses: Potential for falls Goals: Patient will remain injury free Date Initiated: 01/16/2016 Goal Status: Active Interventions: Assess fall risk on admission and as needed Notes: Nutrition Nursing Diagnoses: Imbalanced nutrition Goals: Patient/caregiver agrees to and verbalizes understanding of need to use nutritional supplements and/or vitamins as prescribed Date Initiated: 01/16/2016 Goal Status: Active Interventions: Assess patient nutrition upon admission and as needed per policy Notes: Orientation to the Wound Care Program Nursing Diagnoses: Knowledge deficit related to the wound healing center program Goals: Patient/caregiver will verbalize understanding of the [name],  Lynn S. ([id number]) Date Initiated: 01/16/2016 Goal Status: Active Interventions: Provide education on orientation to the wound center Notes: Pain, Acute or Chronic Nursing Diagnoses: Pain, acute or chronic: actual or potential Potential alteration in comfort, pain Goals: Patient will verbalize adequate pain control and receive pain control interventions during procedures as needed Date Initiated: 01/16/2016 Goal Status: Active Interventions: Assess comfort goal upon admission Complete pain assessment as per visit requirements Notes: Wound/Skin Impairment Nursing Diagnoses: Impaired tissue integrity Goals: Ulcer/skin breakdown will have a volume reduction of 30% by week 4 Date Initiated: 01/16/2016 Goal Status: Active Ulcer/skin breakdown will have a volume reduction of 50% by week 8 Date Initiated: 01/16/2016 Goal Status: Active Ulcer/skin breakdown will have a volume reduction of 80% by week 12 Date Initiated: 01/16/2016 Goal Status: Active Interventions: Assess patient/caregiver ability to obtain necessary supplies Assess ulceration(s) every visit Notes: [name], [name] ([id number]) Electronic Signature(s) Signed: 01/30/2016 4:33:34 PM By: Alric Quan Entered By: Alric Quan on 01/30/2016 15:48:40 [name] ([id number]) -------------------------------------------------------------------------------- Pain Assessment Details Patient Name: [name] Date of Service: 01/30/2016 3:15 PM Medical Record Number: [id number] Patient Account Number: [id number] Date of Birth/Sex: 04/29/1936 (80 y.o. Female) Treating RN: Ahmed Prima Primary Care Physician:  Glori Bickers, MARNE Other Clinician: Referring Physician: [name] Treating Physician/Extender: Frann Rider in Treatment: 2 Active Problems Location of Pain Severity and Description of Pain Patient Has Paino No Site Locations Pain Management and Medication Current Pain  Management: Electronic Signature(s) Signed: 01/30/2016 4:33:34 PM By: Alric Quan Entered By: Alric Quan on 01/30/2016 15:35:32 [name] ([id number]) -------------------------------------------------------------------------------- Patient/Caregiver Education Details Patient Name: [name] Date of Service: 01/30/2016 3:15 PM Medical Record Number: [id number] Patient Account Number: [id number] Date of Birth/Gender: 08-Jan-1936 (79 y.o. Female) Treating RN: Carolyne Fiscal, Debi Primary Care Physician: [name] Other Clinician: Referring Physician: [name] Treating Physician/Extender: Frann Rider in Treatment: 2 Education Assessment Education Provided To: Patient Education Topics Provided Wound/Skin Impairment: Handouts: Other: change dressing as ordered Methods: Demonstration, Explain/Verbal Responses: State content correctly Electronic Signature(s) Signed: 01/30/2016 4:33:34 PM By: Alric Quan Entered By: Alric Quan on 01/30/2016 15:55:56 [name] ([id number]) -------------------------------------------------------------------------------- Wound Assessment Details Patient Name: [name] Date of Service: 01/30/2016 3:15 PM Medical Record Number: [id number] Patient Account Number: [id number] Date of Birth/Sex: Jun 18, 1936 (79 y.o. Female) Treating RN: Carolyne Fiscal, Debi Primary Care Physician: [name] Other Clinician: Referring Physician: [name] Treating Physician/Extender: Frann Rider in Treatment: 2 Wound Status Wound Number: 1 Primary Trauma, Other Etiology: Wound Location: Left Foot - Lateral, Proximal Wound Open Wounding Event: Gradually Appeared Status: Date Acquired: 01/09/2016 Comorbid Chronic Obstructive Pulmonary Weeks Of Treatment: 2 History: Disease (COPD), Hypertension, Clustered Wound: No Osteoarthritis Photos Photo Uploaded By: Alric Quan on 01/30/2016 16:28:54 Wound  Measurements Length: (cm) 0.5 Width: (cm) 0.8 Depth: (cm) 0.5 Area: (cm) 0.314 Volume: (cm) 0.157 % Reduction in Area: 52.4% % Reduction in Volume: -18.9% Epithelialization: Small (1-33%) Tunneling: No Undermining: Yes Starting Position (o'clock): 7 Ending Position (o'clock): 2 Maximum Distance: (cm) 0.6 Wound Description Classification: Partial Thickness Wound Margin: Thickened Exudate Amount: Large Exudate Type: Serous Exudate Color: amber Foul Odor After Cleansing: No Wound Bed [name], Lynn S. ([id number]) Granulation Amount: None Present (0%) Exposed Structure Necrotic Amount: Large (67-100%) Fascia Exposed: No Necrotic Quality: Adherent Slough Fat Layer Exposed: No Tendon Exposed: No Muscle Exposed: No Joint Exposed: No Bone Exposed: No Limited to Skin Breakdown Periwound Skin Texture Texture Color No Abnormalities Noted: No No Abnormalities Noted: No Localized Edema: Yes Erythema: Yes Erythema Location: Circumferential Moisture No Abnormalities Noted: No Temperature / Pain Moist: Yes Temperature: No Abnormality Tenderness on Palpation: Yes Wound Preparation Ulcer Cleansing: Rinsed/Irrigated with Saline Topical Anesthetic Applied: Other: lidocaine 4%, Treatment Notes Wound #1 (Left, Proximal, Lateral Foot) 1. Cleansed with: Clean wound with Normal Saline 2. Anesthetic Topical Lidocaine 4% cream to wound bed prior to debridement 4. Dressing Applied: Medihoney Gel Plain packing gauze 5. Secondary Dressing Applied Guaze, ABD and kerlix/Conform 7. Secured with Tape Notes netting Electronic Signature(s) Signed: 01/30/2016 4:33:34 PM By: Alric Quan Entered By: Alric Quan on 01/30/2016 15:47:40 [name] ([id number]) -------------------------------------------------------------------------------- Wound Assessment Details Patient Name: [name] Date of Service: 01/30/2016 3:15 PM Medical Record Number:  [id number] Patient Account Number: [id number] Date of Birth/Sex: Apr 24, 1936 (79 y.o. Female) Treating RN: Carolyne Fiscal, Debi Primary Care Physician: [name] Other Clinician: Referring Physician: [name] Treating Physician/Extender: Frann Rider in Treatment: 2 Wound Status Wound Number: 2 Primary Trauma, Other Etiology: Wound Location: Left Foot - Lateral, Distal Wound Healed - Epithelialized Wounding Event: Gradually Appeared Status: Date Acquired: 01/09/2016 Comorbid Chronic Obstructive Pulmonary Weeks Of Treatment: 2 History: Disease (COPD), Hypertension, Clustered Wound: No Osteoarthritis Photos Photo Uploaded By: Alric Quan on  01/30/2016 16:29:00 Wound Measurements Length: (cm) 0 % Reduction Width: (cm) 0 % Reduction Depth: (cm) 0 Epitheliali Area: (cm) 0 Volume: (cm) 0 in Area: 100% in Volume: 100% zation: Small (1-33%) Wound Description Classification: Partial Thickness Wound Margin: Flat and Intact Exudate Amount: Large Exudate Type: Serous Exudate Color: amber Foul Odor After Cleansing: No Wound Bed Granulation Amount: Large (67-100%) Exposed Structure Granulation Quality: Red Fascia Exposed: No Necrotic Amount: None Present (0%) Fat Layer Exposed: No Tendon Exposed: No Maser, Lynn S. ([id number]) Muscle Exposed: No Joint Exposed: No Bone Exposed: No Limited to Skin Breakdown Periwound Skin Texture Texture Color No Abnormalities Noted: No No Abnormalities Noted: No Localized Edema: Yes Erythema: Yes Erythema Location: Circumferential Moisture No Abnormalities Noted: No Temperature / Pain Moist: Yes Temperature: No Abnormality Tenderness on Palpation: Yes Wound Preparation Ulcer Cleansing: Rinsed/Irrigated with Saline Topical Anesthetic Applied: None Electronic Signature(s) Signed: 01/30/2016 4:33:34 PM By: Alric Quan Entered By: Alric Quan on 01/30/2016 15:48:34 [name]  ([id number]) -------------------------------------------------------------------------------- Vitals Details Patient Name: [name] Date of Service: 01/30/2016 3:15 PM Medical Record Number: [id number] Patient Account Number: [id number] Date of Birth/Sex: 1936/07/11 (79 y.o. Female) Treating RN: Carolyne Fiscal, Debi Primary Care Physician: [name] Other Clinician: Referring Physician: [name] Treating Physician/Extender: Frann Rider in Treatment: 2 Vital Signs Time Taken: 15:35 Temperature (F): 97.9 Height (in): 67 Pulse (bpm): 52 Weight (lbs): 260 Respiratory Rate (breaths/min): 18 Body Mass Index (BMI): 40.7 Blood Pressure (mmHg): 154/78 Reference Range: 80 - 120 mg / dl Electronic Signature(s) Signed: 01/30/2016 4:33:34 PM By: Alric Quan Entered By: Alric Quan on 01/30/2016 15:40:14

## 2016-01-31 NOTE — Progress Notes (Addendum)
ORETA, THORNGREN ([id number]) Visit Report for 01/30/2016 Chief Complaint Document Details Patient Name: [name], [name] Date of Service: 01/30/2016 3:15 PM Medical Record Number: [id number] Patient Account Number: [id number] Date of Birth/Sex: Jul 17, 1936 (79 y.o. Female) Treating RN: Carolyne Fiscal, Debi Primary Care Physician: [name] Other Clinician: Referring Physician: [name] Treating Physician/Extender: Frann Rider in Treatment: 2 Information Obtained from: Patient Chief Complaint Patient presents to the wound care center for a consult due non healing wound to the left foot with swelling of the left leg which has been quite significant since about 4 months. Electronic Signature(s) Signed: 01/30/2016 3:37:30 PM By: Christin Fudge MD, FACS Entered By: Christin Fudge on 01/30/2016 15:37:30 [name] ([id number]) -------------------------------------------------------------------------------- HPI Details Patient Name: [name] Date of Service: 01/30/2016 3:15 PM Medical Record Number: [id number] Patient Account Number: [id number] Date of Birth/Sex: 1936/01/19 (79 y.o. Female) Treating RN: Carolyne Fiscal, Debi Primary Care Physician: [name] Other Clinician: Referring Physician: [name] Treating Physician/Extender: Frann Rider in Treatment: 2 History of Present Illness Location: swelling of the left lower extremity and a ulcerated area on her left foot dorsum Quality: Patient reports experiencing a dull pain to affected area(s). Severity: Patient states wound are getting worse. Duration: Patient has had the wound for < 2 weeks prior to presenting for treatment. the swelling of the leg has been there for about 4 months Timing: Pain in wound is Intermittent (comes and goes Context: The wound would happen gradually Modifying Factors: Other treatment(s) tried include:antibiotics and local care Associated Signs and Symptoms: Patient reports  having increase swelling. HPI Description: 80 year old patient with a past medical history of essential hypertension, SVT, COPD, osteoarthritis, foot ulcer, obesity, vertigo,pedal edema, from a smoker who quit in 2013. she is also status post tonsillectomy, nasal surgery, wrist fracture surgery and total replacement of her knee. she was seen by her PCP recently Dr. Glori Bickers for cellulitis. She also has a left foot wound which is draining fluid. She has recently been put on doxycycline and was applying some local antibiotic ointment. Recent left foot x-ray shows IMPRESSION:1. Diffuse prominent soft tissue swelling. Subcutaneous air along the dorsum of the foot cannot be excluded. This may be related to infection. 2.o Peripheral vascular disease.3. Diffuse degenerative change with prominent hallux valgus deformity first MTP joint again noted no acute or focal bony abnormality identified. Also a left lower extremity venous duplex study was done 09/11/15 -- No evidence of left lower extremity deep or superficial venous thrombus or incompetence. Electronic Signature(s) Signed: 01/30/2016 3:37:46 PM By: Christin Fudge MD, FACS Entered By: Christin Fudge on 01/30/2016 15:37:46 [name] ([id number]) -------------------------------------------------------------------------------- Physical Exam Details Patient Name: [name] Date of Service: 01/30/2016 3:15 PM Medical Record Number: [id number] Patient Account Number: [id number] Date of Birth/Sex: 01/29/1936 (79 y.o. Female) Treating RN: Carolyne Fiscal, Debi Primary Care Physician: [name] Other Clinician: Referring Physician: [name] Treating Physician/Extender: Frann Rider in Treatment: 2 Constitutional . Pulse regular. Respirations normal and unlabored. Afebrile. . Eyes Nonicteric. Reactive to light. Ears, Nose, Mouth, and Throat Lips, teeth, and gums WNL.[name] Moist mucosa without lesions. Neck supple and nontender. No  palpable supraclavicular or cervical adenopathy. Normal sized without goiter. Respiratory WNL. No retractions.. Breath sounds WNL, No rubs, rales, rhonchi, or wheeze.. Cardiovascular Heart rhythm and rate regular, no murmur or gallop.. Pedal Pulses WNL. No clubbing, cyanosis or edema. Chest Breasts symmetical and no nipple discharge.. Breast tissue WNL, no masses, lumps, or tenderness.. Lymphatic No adneopathy. No  adenopathy. No adenopathy. Musculoskeletal Adexa without tenderness or enlargement.. Digits and nails w/o clubbing, cyanosis, infection, petechiae, ischemia, or inflammatory conditions.. Integumentary (Hair, Skin) No suspicious lesions. No crepitus or fluctuance. No peri-wound warmth or erythema. No masses.[name] Psychiatric Judgement and insight Intact.. No evidence of depression, anxiety, or agitation.. Notes the wound was washed out with moist saline gauze and a Q-tip and looks much cleaner and has minimal slough today. Electronic Signature(s) Signed: 01/30/2016 4:00:02 PM By: Christin Fudge MD, FACS Previous Signature: 01/30/2016 3:42:48 PM Version By: Christin Fudge MD, FACS Entered By: Christin Fudge on 01/30/2016 16:00:02 [name] ([id number]) -------------------------------------------------------------------------------- Physician Orders Details Patient Name: [name] Date of Service: 01/30/2016 3:15 PM Medical Record Number: [id number] Patient Account Number: [id number] Date of Birth/Sex: February 04, 1936 (79 y.o. Female) Treating RN: Carolyne Fiscal, Debi Primary Care Physician: [name] Other Clinician: Referring Physician: [name] Treating Physician/Extender: Frann Rider in Treatment: 2 Verbal / Phone Orders: Yes Clinician: Carolyne Fiscal, Debi Read Back and Verified: Yes Diagnosis Coding ICD-10 Coding Code Description I89.0 Lymphedema, not elsewhere classified L97.522 Non-pressure chronic ulcer of other part of left foot with fat layer  exposed L03.116 Cellulitis of left lower limb Wound Cleansing Wound #1 Left,Proximal,Lateral Foot o Clean wound with Normal Saline. Anesthetic Wound #1 Left,Proximal,Lateral Foot o Topical Lidocaine 4% cream applied to wound bed prior to debridement Primary Wound Dressing Wound #1 Left,Proximal,Lateral Foot o Medihoney gel o Other: - 1/4 inch packing guaze Secondary Dressing Wound #1 Left,Proximal,Lateral Foot o ABD pad o Dry Gauze o Conform/Kerlix - netting, tape Dressing Change Frequency Wound #1 Left,Proximal,Lateral Foot o Change dressing every day. Follow-up Appointments Wound #1 Left,Proximal,Lateral Foot o Return Appointment in 1 week. Edema Control Moroz, Cristian S. ([id number]) Wound #1 Left,Proximal,Lateral Foot o Elevate legs to the level of the heart and pump ankles as often as possible o Other: - pt to get 20-85mm compression stockings Additional Orders / Instructions Wound #1 Left,Proximal,Lateral Foot o Increase protein intake. Electronic Signature(s) Signed: 01/30/2016 4:33:34 PM By: Alric Quan Signed: 02/02/2016 8:10:40 AM By: Christin Fudge MD, FACS Entered By: Alric Quan on 01/30/2016 15:54:46 [name] ([id number]) -------------------------------------------------------------------------------- Problem List Details Patient Name: [name] Date of Service: 01/30/2016 3:15 PM Medical Record Number: [id number] Patient Account Number: [id number] Date of Birth/Sex: 24-Dec-1935 (79 y.o. Female) Treating RN: Carolyne Fiscal, Debi Primary Care Physician: [name] Other Clinician: Referring Physician: [name] Treating Physician/Extender: Frann Rider in Treatment: 2 Active Problems ICD-10 Encounter Code Description Active Date Diagnosis I89.0 Lymphedema, not elsewhere classified 01/16/2016 Yes L97.522 Non-pressure chronic ulcer of other part of left foot with fat 01/16/2016 Yes layer  exposed L03.116 Cellulitis of left lower limb 01/16/2016 Yes Inactive Problems Resolved Problems Electronic Signature(s) Signed: 01/30/2016 3:37:23 PM By: Christin Fudge MD, FACS Entered By: Christin Fudge on 01/30/2016 15:37:23 [name] ([id number]) -------------------------------------------------------------------------------- Progress Note Details Patient Name: [name] Date of Service: 01/30/2016 3:15 PM Medical Record Number: [id number] Patient Account Number: [id number] Date of Birth/Sex: [birth year] (79 y.o. Female) Treating RN: Carolyne Fiscal, Debi Primary Care Physician: [name] Other Clinician: Referring Physician: [name] Treating Physician/Extender: Frann Rider in Treatment: 2 Subjective Chief Complaint Information obtained from Patient Patient presents to the wound care center for a consult due non healing wound to the left foot with swelling of the left leg which has been quite significant since about 4 months. History of Present Illness (HPI) The following HPI elements were documented for the patient's wound: Location: swelling of the left lower  extremity and a ulcerated area on her left foot dorsum Quality: Patient reports experiencing a dull pain to affected area(s). Severity: Patient states wound are getting worse. Duration: Patient has had the wound for < 2 weeks prior to presenting for treatment. the swelling of the leg has been there for about 4 months Timing: Pain in wound is Intermittent (comes and goes Context: The wound would happen gradually Modifying Factors: Other treatment(s) tried include:antibiotics and local care Associated Signs and Symptoms: Patient reports having increase swelling. 80 year old patient with a past medical history of essential hypertension, SVT, COPD, osteoarthritis, foot ulcer, obesity, vertigo,pedal edema, from a smoker who quit in 2013. she is also status post tonsillectomy, nasal surgery, wrist  fracture surgery and total replacement of her knee. she was seen by her PCP recently Dr. Glori Bickers for cellulitis. She also has a left foot wound which is draining fluid. She has recently been put on doxycycline and was applying some local antibiotic ointment. Recent left foot x-ray shows IMPRESSION:1. Diffuse prominent soft tissue swelling. Subcutaneous air along the dorsum of the foot cannot be excluded. This may be related to infection. 2. Peripheral vascular disease.3. Diffuse degenerative change with prominent hallux valgus deformity first MTP joint again noted no acute or focal bony abnormality identified. Also a left lower extremity venous duplex study was done 09/11/15 -- No evidence of left lower extremity deep or superficial venous thrombus or incompetence. Objective Prioleau, Lizann S. ([id number]) Constitutional Pulse regular. Respirations normal and unlabored. Afebrile. Vitals Time Taken: 3:35 PM, Height: 67 in, Weight: 260 lbs, BMI: 40.7, Temperature: 97.9 F, Pulse: 52 bpm, Respiratory Rate: 18 breaths/min, Blood Pressure: 154/78 mmHg. Eyes Nonicteric. Reactive to light. Ears, Nose, Mouth, and Throat Lips, teeth, and gums WNL.[name] Moist mucosa without lesions. Neck supple and nontender. No palpable supraclavicular or cervical adenopathy. Normal sized without goiter. Respiratory WNL. No retractions.. Breath sounds WNL, No rubs, rales, rhonchi, or wheeze.. Cardiovascular Heart rhythm and rate regular, no murmur or gallop.. Pedal Pulses WNL. No clubbing, cyanosis or edema. Chest Breasts symmetical and no nipple discharge.. Breast tissue WNL, no masses, lumps, or tenderness.. Lymphatic No adneopathy. No adenopathy. No adenopathy. Musculoskeletal Adexa without tenderness or enlargement.. Digits and nails w/o clubbing, cyanosis, infection, petechiae, ischemia, or inflammatory conditions.[name] Psychiatric Judgement and insight Intact.. No evidence of depression, anxiety, or  agitation.. General Notes: the wound was washed out with moist saline gauze and a Q-tip and looks much cleaner and has minimal slough today. Integumentary (Hair, Skin) No suspicious lesions. No crepitus or fluctuance. No peri-wound warmth or erythema. No masses.. Wound #1 status is Open. Original cause of wound was Gradually Appeared. The wound is located on the Left,Proximal,Lateral Foot. The wound measures 0.5cm length x 0.8cm width x 0.5cm depth; 0.314cm^2 area and 0.157cm^3 volume. The wound is limited to skin breakdown. There is no tunneling noted, however, there is undermining starting at 7:00 and ending at 2:00 with a maximum distance of 0.6cm. There is a large amount of serous drainage noted. The wound margin is thickened. There is no granulation within the wound bed. There is a large (67-100%) amount of necrotic tissue within the wound bed including Adherent Slough. The periwound skin appearance exhibited: Localized Edema, Moist, Erythema. The surrounding wound skin color is noted with erythema which is circumferential. Periwound temperature was Ashe, Annikah S. ([id number]) noted as No Abnormality. The periwound has tenderness on palpation. Wound #2 status is Healed - Epithelialized. Original cause of wound was Gradually Appeared. The wound is  located on the Left,Distal,Lateral Foot. The wound measures 0cm length x 0cm width x 0cm depth; 0cm^2 area and 0cm^3 volume. The wound is limited to skin breakdown. There is a large amount of serous drainage noted. The wound margin is flat and intact. There is large (67-100%) red granulation within the wound bed. There is no necrotic tissue within the wound bed. The periwound skin appearance exhibited: Localized Edema, Moist, Erythema. The surrounding wound skin color is noted with erythema which is circumferential. Periwound temperature was noted as No Abnormality. The periwound has tenderness on palpation. Assessment Active  Problems ICD-10 I89.0 - Lymphedema, not elsewhere classified L97.522 - Non-pressure chronic ulcer of other part of left foot with fat layer exposed L03.116 - Cellulitis of left lower limb Plan Wound Cleansing: Wound #1 Left,Proximal,Lateral Foot: Clean wound with Normal Saline. Anesthetic: Wound #1 Left,Proximal,Lateral Foot: Topical Lidocaine 4% cream applied to wound bed prior to debridement Primary Wound Dressing: Wound #1 Left,Proximal,Lateral Foot: Medihoney gel Other: - 1/4 inch packing guaze Secondary Dressing: Wound #1 Left,Proximal,Lateral Foot: ABD pad Dry Gauze Conform/Kerlix - netting, tape Dressing Change Frequency: Wound #1 Left,Proximal,Lateral Foot: Change dressing every day. Follow-up Appointments: Wound #1 Left,Proximal,Lateral Foot: GIZZEL, HOFFMEISTER. ([id number]) Return Appointment in 1 week. Edema Control: Wound #1 Left,Proximal,Lateral Foot: Elevate legs to the level of the heart and pump ankles as often as possible Other: - pt to get 20-56mm compression stockings Additional Orders / Instructions: Wound #1 Left,Proximal,Lateral Foot: Increase protein intake. I have recommended: 1. Medihoney ointment to be applied locally daily, and packed with a 1/4 inch gauze, with a bordered foam. 2. Compression stockings of the 20-30 mm variety to be worn all day -- she says she tried this but it's impossible for her to wear this and she refuses to try any more. 3. Elevation and exercise. 4. arterial and venous duplex studies of both lower extremities.-- appointment pending later in June 5. she has an appointment to change over to the [facility] wound center and may see me there. Electronic Signature(s) Signed: 01/30/2016 4:01:57 PM By: Christin Fudge MD, FACS Entered By: Christin Fudge on 01/30/2016 16:01:57 [name] ([id number]) -------------------------------------------------------------------------------- SuperBill Details Patient Name: [name] Date of Service: 01/30/2016 Medical Record Number: [id number] Patient Account Number: [id number] Date of Birth/Sex: February 03, 1936 (79 y.o. 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 exposed L03.116 Cellulitis of left lower limb Facility Procedures CPT4 Code: [id number] Description: 99213 - WOUND CARE VISIT-LEV 3 EST PT Modifier: Quantity: 1 Physician Procedures CPT4 Code Description: [id number] - WC PHYS LEVEL 3 - EST PT ICD-10 Description Diagnosis I89.0 Lymphedema, not elsewhere classified L97.522 Non-pressure chronic ulcer of other part of left foo L03.116 Cellulitis of left lower limb Modifier: t with fat laye Quantity: 1 r exposed Electronic Signature(s) Signed: 01/30/2016 4:33:34 PM By: Alric Quan Signed: 02/02/2016 8:10:40 AM By: Christin Fudge MD, FACS Previous Signature: 01/30/2016 4:02:11 PM Version By: Christin Fudge MD, FACS Entered By: Alric Quan on 01/30/2016 16:17:06

## 2016-02-05 ENCOUNTER — Other Ambulatory Visit: Payer: Self-pay | Admitting: Surgery

## 2016-02-05 ENCOUNTER — Ambulatory Visit (HOSPITAL_COMMUNITY)
Admission: RE | Admit: 2016-02-05 | Discharge: 2016-02-05 | Disposition: A | Discharge status: Home / Self Care | Payer: 59 | Source: Ambulatory Visit | Attending: Internal Medicine | Admitting: Internal Medicine

## 2016-02-05 DIAGNOSIS — Z720 Tobacco use: Secondary | ICD-10-CM | POA: Insufficient documentation

## 2016-02-05 DIAGNOSIS — R609 Edema, unspecified: Secondary | ICD-10-CM

## 2016-02-05 DIAGNOSIS — I10 Essential (primary) hypertension: Secondary | ICD-10-CM | POA: Diagnosis not present

## 2016-02-05 DIAGNOSIS — E785 Hyperlipidemia, unspecified: Secondary | ICD-10-CM | POA: Insufficient documentation

## 2016-02-05 DIAGNOSIS — L97929 Non-pressure chronic ulcer of unspecified part of left lower leg with unspecified severity: Secondary | ICD-10-CM

## 2016-02-06 ENCOUNTER — Ambulatory Visit: Payer: 59 | Admitting: Surgery

## 2016-02-09 ENCOUNTER — Inpatient Hospital Stay (HOSPITAL_COMMUNITY): Admission: RE | Admit: 2016-02-09 | Payer: 59 | Source: Ambulatory Visit

## 2016-02-10 ENCOUNTER — Ambulatory Visit: Payer: 59 | Admitting: Podiatry

## 2016-02-10 ENCOUNTER — Encounter (HOSPITAL_BASED_OUTPATIENT_CLINIC_OR_DEPARTMENT_OTHER): Payer: 59 | Attending: Surgery

## 2016-02-10 DIAGNOSIS — I890 Lymphedema, not elsewhere classified: Secondary | ICD-10-CM | POA: Insufficient documentation

## 2016-02-10 DIAGNOSIS — J449 Chronic obstructive pulmonary disease, unspecified: Secondary | ICD-10-CM | POA: Insufficient documentation

## 2016-02-10 DIAGNOSIS — E669 Obesity, unspecified: Secondary | ICD-10-CM | POA: Insufficient documentation

## 2016-02-10 DIAGNOSIS — I471 Supraventricular tachycardia: Secondary | ICD-10-CM | POA: Diagnosis not present

## 2016-02-10 DIAGNOSIS — Z87891 Personal history of nicotine dependence: Secondary | ICD-10-CM | POA: Insufficient documentation

## 2016-02-10 DIAGNOSIS — L97322 Non-pressure chronic ulcer of left ankle with fat layer exposed: Secondary | ICD-10-CM | POA: Insufficient documentation

## 2016-02-10 DIAGNOSIS — L03116 Cellulitis of left lower limb: Secondary | ICD-10-CM | POA: Diagnosis not present

## 2016-02-10 DIAGNOSIS — Z79899 Other long term (current) drug therapy: Secondary | ICD-10-CM | POA: Insufficient documentation

## 2016-02-10 DIAGNOSIS — L97522 Non-pressure chronic ulcer of other part of left foot with fat layer exposed: Secondary | ICD-10-CM | POA: Insufficient documentation

## 2016-02-17 DIAGNOSIS — L97522 Non-pressure chronic ulcer of other part of left foot with fat layer exposed: Secondary | ICD-10-CM | POA: Diagnosis not present

## 2016-02-18 ENCOUNTER — Encounter (HOSPITAL_COMMUNITY): Payer: 59

## 2016-02-23 ENCOUNTER — Encounter (HOSPITAL_COMMUNITY): Payer: 59

## 2016-02-25 ENCOUNTER — Ambulatory Visit (HOSPITAL_COMMUNITY)
Admission: RE | Admit: 2016-02-25 | Discharge: 2016-02-25 | Disposition: A | Discharge status: Home / Self Care | Payer: 59 | Source: Ambulatory Visit | Attending: Family Medicine | Admitting: Family Medicine

## 2016-02-25 ENCOUNTER — Encounter (HOSPITAL_BASED_OUTPATIENT_CLINIC_OR_DEPARTMENT_OTHER): Payer: 59

## 2016-02-25 DIAGNOSIS — R938 Abnormal findings on diagnostic imaging of other specified body structures: Secondary | ICD-10-CM | POA: Insufficient documentation

## 2016-02-25 DIAGNOSIS — I10 Essential (primary) hypertension: Secondary | ICD-10-CM | POA: Diagnosis not present

## 2016-02-25 DIAGNOSIS — Z720 Tobacco use: Secondary | ICD-10-CM | POA: Diagnosis not present

## 2016-02-25 DIAGNOSIS — I739 Peripheral vascular disease, unspecified: Secondary | ICD-10-CM | POA: Diagnosis not present

## 2016-02-25 DIAGNOSIS — E785 Hyperlipidemia, unspecified: Secondary | ICD-10-CM | POA: Insufficient documentation

## 2016-03-02 ENCOUNTER — Encounter (HOSPITAL_BASED_OUTPATIENT_CLINIC_OR_DEPARTMENT_OTHER): Payer: 59 | Attending: Surgery

## 2016-03-02 DIAGNOSIS — Z6841 Body Mass Index (BMI) 40.0 and over, adult: Secondary | ICD-10-CM | POA: Diagnosis not present

## 2016-03-02 DIAGNOSIS — L03116 Cellulitis of left lower limb: Secondary | ICD-10-CM | POA: Insufficient documentation

## 2016-03-02 DIAGNOSIS — Z87891 Personal history of nicotine dependence: Secondary | ICD-10-CM | POA: Diagnosis not present

## 2016-03-02 DIAGNOSIS — I890 Lymphedema, not elsewhere classified: Secondary | ICD-10-CM | POA: Diagnosis not present

## 2016-03-02 DIAGNOSIS — Z96659 Presence of unspecified artificial knee joint: Secondary | ICD-10-CM | POA: Insufficient documentation

## 2016-03-02 DIAGNOSIS — E669 Obesity, unspecified: Secondary | ICD-10-CM | POA: Diagnosis not present

## 2016-03-02 DIAGNOSIS — L97521 Non-pressure chronic ulcer of other part of left foot limited to breakdown of skin: Secondary | ICD-10-CM | POA: Diagnosis not present

## 2016-03-02 DIAGNOSIS — I10 Essential (primary) hypertension: Secondary | ICD-10-CM | POA: Diagnosis not present

## 2016-03-02 DIAGNOSIS — J449 Chronic obstructive pulmonary disease, unspecified: Secondary | ICD-10-CM | POA: Insufficient documentation

## 2016-03-02 DIAGNOSIS — L97222 Non-pressure chronic ulcer of left calf with fat layer exposed: Secondary | ICD-10-CM | POA: Diagnosis not present

## 2016-03-06 ENCOUNTER — Other Ambulatory Visit: Payer: Self-pay | Admitting: Family Medicine

## 2016-03-08 NOTE — Telephone Encounter (Signed)
Received refill electronically Last office visit 01/13/16/acute Last refill 12/12/14 #90/3 No follow up appointment scheduled

## 2016-03-08 NOTE — Telephone Encounter (Signed)
Refill sent to pharmacy as instructed. 

## 2016-03-08 NOTE — Telephone Encounter (Signed)
Please refill for a year  

## 2016-03-09 DIAGNOSIS — L97521 Non-pressure chronic ulcer of other part of left foot limited to breakdown of skin: Secondary | ICD-10-CM | POA: Diagnosis not present

## 2016-03-16 DIAGNOSIS — L97521 Non-pressure chronic ulcer of other part of left foot limited to breakdown of skin: Secondary | ICD-10-CM | POA: Diagnosis not present

## 2016-03-17 ENCOUNTER — Telehealth: Payer: Self-pay | Admitting: Hematology

## 2016-03-17 NOTE — Telephone Encounter (Signed)
[name] & spoke to pt and gave pt r/s time & date of appt per Dr Chilton Greathouse moved due to not being here

## 2016-03-18 ENCOUNTER — Other Ambulatory Visit: Payer: Self-pay | Admitting: Family Medicine

## 2016-03-18 ENCOUNTER — Ambulatory Visit: Payer: 59 | Admitting: Hematology

## 2016-03-18 ENCOUNTER — Other Ambulatory Visit: Payer: 59

## 2016-03-19 ENCOUNTER — Ambulatory Visit: Payer: 59 | Admitting: Hematology

## 2016-03-19 ENCOUNTER — Other Ambulatory Visit: Payer: 59

## 2016-03-22 ENCOUNTER — Other Ambulatory Visit (HOSPITAL_BASED_OUTPATIENT_CLINIC_OR_DEPARTMENT_OTHER): Payer: 59

## 2016-03-22 ENCOUNTER — Telehealth: Payer: Self-pay | Admitting: Hematology

## 2016-03-22 ENCOUNTER — Ambulatory Visit (HOSPITAL_BASED_OUTPATIENT_CLINIC_OR_DEPARTMENT_OTHER): Payer: 59 | Admitting: Hematology

## 2016-03-22 ENCOUNTER — Other Ambulatory Visit: Payer: Self-pay | Admitting: *Deleted

## 2016-03-22 ENCOUNTER — Encounter: Payer: Self-pay | Admitting: Hematology

## 2016-03-22 ENCOUNTER — Other Ambulatory Visit: Payer: 59

## 2016-03-22 VITALS — BP 132/81 | HR 81 | Temp 98.1°F | Resp 17 | Ht 67.0 in | Wt 242.1 lb

## 2016-03-22 DIAGNOSIS — R932 Abnormal findings on diagnostic imaging of liver and biliary tract: Secondary | ICD-10-CM | POA: Diagnosis not present

## 2016-03-22 DIAGNOSIS — D472 Monoclonal gammopathy: Secondary | ICD-10-CM | POA: Diagnosis not present

## 2016-03-22 LAB — COMPREHENSIVE METABOLIC PANEL
ALBUMIN: 3.6 g/dL (ref 3.5–5.0)
ALK PHOS: 75 U/L (ref 40–150)
ANION GAP: 8 meq/L (ref 3–11)
AST: 14 U/L (ref 5–34)
BUN: 13.6 mg/dL (ref 7.0–26.0)
CALCIUM: 9.6 mg/dL (ref 8.4–10.4)
CHLORIDE: 102 meq/L (ref 98–109)
CO2: 31 mEq/L — ABNORMAL HIGH (ref 22–29)
CREATININE: 0.9 mg/dL (ref 0.6–1.1)
EGFR: 60 mL/min/{1.73_m2} — ABNORMAL LOW (ref >90)
Glucose: 100 mg/dl (ref 70–140)
POTASSIUM: 3.6 meq/L (ref 3.5–5.1)
Sodium: 141 mEq/L (ref 136–145)
Total Bilirubin: 0.5 mg/dL (ref 0.20–1.20)
Total Protein: 7.1 g/dL (ref 6.4–8.3)

## 2016-03-22 LAB — CBC & DIFF AND RETIC
BASO%: 0.1 % (ref 0.0–2.0)
Basophils Absolute: 0 10*3/uL (ref 0.0–0.1)
EOS ABS: 0.1 10*3/uL (ref 0.0–0.5)
EOS%: 0.7 % (ref 0.0–7.0)
HCT: 36.2 % (ref 34.8–46.6)
HEMOGLOBIN: 11.6 g/dL (ref 11.6–15.9)
Immature Retic Fract: 7.3 % (ref 1.60–10.00)
LYMPH%: 43.5 % (ref 14.0–49.7)
MCH: 30.3 pg (ref 25.1–34.0)
MCHC: 32 g/dL (ref 31.5–36.0)
MCV: 94.5 fL (ref 79.5–101.0)
MONO#: 0.6 10*3/uL (ref 0.1–0.9)
MONO%: 7.7 % (ref 0.0–14.0)
NEUT%: 48 % (ref 38.4–76.8)
NEUTROS ABS: 3.7 10*3/uL (ref 1.5–6.5)
Platelets: 211 10*3/uL (ref 145–400)
RBC: 3.83 10*6/uL (ref 3.70–5.45)
RDW: 16.1 % — AB (ref 11.2–14.5)
RETIC %: 2.13 % — AB (ref 0.70–2.10)
Retic Ct Abs: 81.58 10*3/uL (ref 33.70–90.70)
WBC: 7.7 10*3/uL (ref 3.9–10.3)
lymph#: 3.3 10*3/uL (ref 0.9–3.3)

## 2016-03-22 NOTE — Telephone Encounter (Signed)
per pof to sch pt appt-cld pt and left a message of time & date of appt for 10/31@3 :30

## 2016-03-23 ENCOUNTER — Encounter (HOSPITAL_BASED_OUTPATIENT_CLINIC_OR_DEPARTMENT_OTHER): Payer: 59 | Attending: Surgery

## 2016-03-23 ENCOUNTER — Telehealth: Payer: Self-pay | Admitting: Hematology

## 2016-03-23 ENCOUNTER — Telehealth: Payer: Self-pay | Admitting: *Deleted

## 2016-03-23 DIAGNOSIS — M1990 Unspecified osteoarthritis, unspecified site: Secondary | ICD-10-CM | POA: Insufficient documentation

## 2016-03-23 DIAGNOSIS — L98492 Non-pressure chronic ulcer of skin of other sites with fat layer exposed: Secondary | ICD-10-CM | POA: Diagnosis present

## 2016-03-23 DIAGNOSIS — I10 Essential (primary) hypertension: Secondary | ICD-10-CM | POA: Diagnosis not present

## 2016-03-23 DIAGNOSIS — J449 Chronic obstructive pulmonary disease, unspecified: Secondary | ICD-10-CM | POA: Diagnosis not present

## 2016-03-23 DIAGNOSIS — L732 Hidradenitis suppurativa: Secondary | ICD-10-CM | POA: Insufficient documentation

## 2016-03-23 LAB — KAPPA/LAMBDA LIGHT CHAINS
IG KAPPA FREE LIGHT CHAIN: 262.8 mg/L — AB (ref 3.3–19.4)
IG LAMBDA FREE LIGHT CHAIN: 12.3 mg/L (ref 5.7–26.3)
Kappa/Lambda FluidC Ratio: 21.37 — ABNORMAL HIGH (ref 0.26–1.65)

## 2016-03-23 NOTE — Telephone Encounter (Signed)
spoke w/ pt confirmed 8/7 apt @ 10:15 with  [facility]

## 2016-03-23 NOTE — Telephone Encounter (Signed)
Per Dr. Irene Limbo, called patient to inform her that he has ordered MRI and surgery consult related to gallbladder issues.  No answer, lvm for patient to call us at her convenience.

## 2016-03-23 NOTE — Progress Notes (Signed)
[name]  HEMATOLOGY ONCOLOGY PROGRESS NOTE  Date of service: .03/22/2016   Patient Care Team: Abner Greenspan, MD as PCP - General  Diagnosis:  IgA kappa MGUS  Current Treatment: observation  INTERVAL HISTORY:  [name]  is here for a scheduled follow-up for MGUS. She notes chronic low back pain and other body aches. Has had issues with left foot ulcer and a left upper medial thigh ulcer for which she is being followed by wound care clinic . appears to have chronic venous insufficiency . Labs show slowly developing anemia . She was also noted to have a bump in her Free light chains and labs in March 2017. Still has not had her MRI of the abdomen as recommended to evaluate the mass near her gallbladder. We discussed that the labs showed increasing kappa free light chains . she is okay with getting a repeat bone survey but is hesitant to consider a bone marrow biopsy at this time . She continues to follow-up with Dr. Hardin Negus for chronic pain management and we Dr. Estanislado Pandy for Mx of PMR.   REVIEW OF SYSTEMS:    10 Point review of systems of done and is negative except as noted above.  . Past Medical History:  Diagnosis Date  . Chronic pain    Managed by Dr. Hardin Negus  . Diverticulitis   . Hyperlipidemia   . Hypertension   . IBS (irritable bowel syndrome)   . Palpitations   . Polymyalgia rheumatica ([place])   . PVC's (premature ventricular contractions)   . SVT (supraventricular tachycardia) ([place])   . Tobacco dependence     . Past Surgical History:  Procedure Laterality Date  . NASAL SINUS SURGERY    . REPLACEMENT TOTAL KNEE    . TONSILLECTOMY    . WRIST FRACTURE SURGERY      . Social History  Substance Use Topics  . Smoking status: Former Smoker    Packs/day: 0.50    Quit date: 12/12/2011  . Smokeless tobacco: Never Used  . Alcohol use No    ALLERGIES:  is allergic to amoxicillin-pot clavulanate; aspirin; atorvastatin; carisoprodol; codeine; codeine phosphate;  cortisone; ezetimibe-simvastatin; naproxen sodium; omeprazole; quinapril hcl; and rofecoxib.  MEDICATIONS:  Current Outpatient Prescriptions  Medication Sig Dispense Refill  . levothyroxine (SYNTHROID, LEVOTHROID) 50 MCG tablet Take 1 tablet (50 mcg total) by mouth daily. 90 tablet 0  . meclizine (ANTIVERT) 25 MG tablet TAKE 1 TABLET (25 MG TOTAL) BY MOUTH 3 (THREE) TIMES DAILY AS NEEDED FOR DIZZINESS. 30 tablet 2  . metoprolol tartrate (LOPRESSOR) 25 MG tablet TAKE 1 TABLET (25 MG TOTAL) BY MOUTH 2 (TWO) TIMES DAILY. 180 tablet 0  . morphine (MS CONTIN) 15 MG 12 hr tablet Take 1 tablet by mouth daily.    [name] morphine (MS CONTIN) 30 MG 12 hr tablet Take 1 tablet by mouth daily.    . mupirocin ointment (BACTROBAN) 2 % Apply 1 application topically 3 (three) times daily. Apply to affected area (sore on your foot) three times daily 15 g 0  . pantoprazole (PROTONIX) 40 MG tablet TAKE 1 TABLET (40 MG TOTAL) BY MOUTH DAILY. 90 tablet 3  . predniSONE (DELTASONE) 5 MG tablet TAKE 1 TABLET BY MOUTH EVERY DAY WITH 1 MG TABLET TOTAL 8MG  0  . sertraline (ZOLOFT) 50 MG tablet TAKE 1 TABLET BY MOUTH EVERY DAY 90 tablet 3  . spironolactone (ALDACTONE) 25 MG tablet TAKE 1 TABLET (25 MG TOTAL) BY MOUTH DAILY. 90 tablet 1  .  Wheat Dextrin (BENEFIBER PO) Take by mouth 2 (two) times daily.     No current facility-administered medications for this visit.     PHYSICAL EXAMINATION: ECOG PERFORMANCE STATUS: 2 - Symptomatic, <50% confined to bed  . Vitals:   03/22/16 1534  BP: 132/81  Pulse: 81  Resp: 17  Temp: 98.1 F (36.7 C)    Filed Weights   03/22/16 1534  Weight: 242 lb 1.6 oz (109.8 kg)   .Body mass index is 37.92 kg/m.  GENERAL:alert, in no acute distress and comfortable SKIN: skin color, texture, turgor are normal, no rashes or significant lesions EYES: normal, conjunctiva are pink and non-injected, sclera clear OROPHARYNX:no exudate, no erythema and lips, buccal mucosa, and tongue normal    NECK: supple, no JVD, thyroid normal size, non-tender, without nodularity LYMPH:  no palpable lymphadenopathy in the cervical, axillary or inguinal LUNGS: clear to auscultation with normal respiratory effort HEART: regular rate & rhythm,  no murmurs and no lower extremity edema ABDOMEN: abdomen obese, soft, non-tender, normoactive bowel sounds  Musculoskeletal: no cyanosis of digits and no clubbing  PSYCH: alert & oriented x 3 with fluent speech NEURO: no focal motor/sensory deficits  LABORATORY DATA:   I have reviewed the data as listed  . CBC Latest Ref Rng & Units 03/22/2016 11/18/2015 09/11/2015  WBC 3.9 - 10.3 10e3/uL 7.7 6.3 7.3  Hemoglobin 11.6 - 15.9 g/dL 11.6 11.5(L) 12.6  Hematocrit 34.8 - 46.6 % 36.2 35.4 39.5  Platelets 145 - 400 10e3/uL 211 169 203.0   . CBC    Component Value Date/Time   WBC 7.7 03/22/2016 1520   WBC 7.3 09/11/2015 1200   RBC 3.83 03/22/2016 1520   RBC 4.51 09/11/2015 1200   HGB 11.6 03/22/2016 1520   HCT 36.2 03/22/2016 1520   PLT 211 03/22/2016 1520   MCV 94.5 03/22/2016 1520   MCH 30.3 03/22/2016 1520   MCH 29.7 12/11/2010 0436   MCHC 32.0 03/22/2016 1520   MCHC 32.0 09/11/2015 1200   RDW 16.1 (H) 03/22/2016 1520   LYMPHSABS 3.3 03/22/2016 1520   MONOABS 0.6 03/22/2016 1520   EOSABS 0.1 03/22/2016 1520   BASOSABS 0.0 03/22/2016 1520     . CMP Latest Ref Rng & Units 03/22/2016 11/18/2015 11/18/2015  Glucose 70 - 140 mg/dl 100 104 -  BUN 7.0 - 26.0 mg/dL 13.6 27.3(H) -  Creatinine 0.6 - 1.1 mg/dL 0.9 1.0 -  Sodium 136 - 145 mEq/L 141 139 -  Potassium 3.5 - 5.1 mEq/L 3.6 4.2 -  Chloride 98 - 110 mmol/L - - -  CO2 22 - 29 mEq/L 31(H) 26 -  Calcium 8.4 - 10.4 mg/dL 9.6 9.4 -  Total Protein 6.4 - 8.3 g/dL 7.1 7.1 6.8  Total Bilirubin 0.20 - 1.20 mg/dL 0.50 0.61 -  Alkaline Phos 40 - 150 U/L 75 50 -  AST 5 - 34 U/L 14 19 -  ALT 0 - 55 U/L <9 16 -        RADIOGRAPHIC STUDIES: I have personally reviewed the radiological images as  listed and agreed with the findings in the report. No results found.  ASSESSMENT & PLAN:     80 year old Caucasian female with  #1 IgA kappa monoclonal gammopathy of undetermined significance. M protein spike of 0.2. PET/CT scan shows no evidence of hypermetabolic bone lesions. No anemia, no renal failure no hypercalcemia. Significantly altered kappa lambda light chain ratio along with non-IgG MGUS suggesting a higher risk of progression to a more  concerning clonal plasma cell dyscrasia.  Myeloma labs on 11/18/2015 showed no clear M spike but IFE still shows IgG kappa monoclonal protein. Serum free light chains showed increase in kappa free light chains from 24.8 in November 2016 to 187.6 on 11/18/2015 but signficant change in kappa/lambda ratio.  Patient appears to be slowly getting anemic with progressive drop in hgb from 13.6 to 11.6.   #2 Incidentally noted bilateral sacral insufficiency fractures right greater than left. Plan -her Myeloma labs drawn today are pending -we shall follow these up. -No other acute new symptoms. Has chronic fatigue and bone pains which are difficult to differentiate from new symptoms. -Given increase in serum free light chains shall get a whole-body bone survey again. -If her serum free light chain/myeloma labs also appear worse willing to consider a bone marrow examination. She is somewhat hesitant regarding this.  #3 Incidentally noted small focus of hypermetabolism in the liver adjacent to the gallbladder with gallbladder showing a small soft tissue lesion/calcified stone.Patient continues to be asymptomatic with this. We had ordered an MRI of the liver which are schedulers called her with but she refused to have that done citing that there were too many things happening in her life. She eventually agreed to do an ultrasound which showed a hepatic hemangioma in the left lobe and a complex-appearing nonmobile focus in the gallbladder fundus corresponding to  her PET scan. Plan -She will need an MRI of her abdomen to better evaluate her gallbladder mass. -We shall also given a referral to Gen. surgery for further evaluation.  #4 osteoarthritis and polymyalgia rheumatica -Being managed by Dr. Estanislado Pandy from rheumatology. She also follows with Dr. Hardin Negus for chronic pain management.  Return to care in 3 months with Dr. Irene Limbo unless myeloma labs/MRI abd suggest need for earlier followup    Sullivan Lone MD [place] [facility] Hematology/Oncology Physician [place]  (Office):       [phone] (Work cell):  [phone] (Fax):           [phone]

## 2016-03-24 LAB — MULTIPLE MYELOMA PANEL, SERUM
ALBUMIN SERPL ELPH-MCNC: 3.4 g/dL (ref 2.9–4.4)
ALPHA 1: 0.3 g/dL (ref 0.0–0.4)
ALPHA2 GLOB SERPL ELPH-MCNC: 0.7 g/dL (ref 0.4–1.0)
Albumin/Glob SerPl: 1.1 (ref 0.7–1.7)
B-GLOBULIN SERPL ELPH-MCNC: 1.1 g/dL (ref 0.7–1.3)
GAMMA GLOB SERPL ELPH-MCNC: 1.1 g/dL (ref 0.4–1.8)
GLOBULIN, TOTAL: 3.2 g/dL (ref 2.2–3.9)
IgA, Qn, Serum: 218 mg/dL (ref 64–422)
IgM, Qn, Serum: 32 mg/dL (ref 26–217)
TOTAL PROTEIN: 6.6 g/dL (ref 6.0–8.5)

## 2016-03-24 NOTE — Telephone Encounter (Signed)
-----   Message from Brunetta Genera, MD sent at 03/23/2016  6:42 AM EDT ----- Delle Reining, Could elect Mrs. Zellers know that there was a persistent abnormality in her gall bladder and they we have ordered MRI abd which she has been hesitant in getting previously) and placed a referral to general surgery. POF has been placed but need to make sure scheduler have seen this.  Thanks, GK

## 2016-03-24 NOTE — Telephone Encounter (Signed)
LVM X 2 to inform patient that Dr. Irene Limbo has placed orders for MRI and surgery consult d/t gallbladder issues.  Request patient to call us back.

## 2016-03-29 ENCOUNTER — Telehealth: Payer: Self-pay | Admitting: Cardiology

## 2016-03-29 NOTE — Telephone Encounter (Signed)
Returned call to patient's sister Katharine Look.She stated patient woke up this morning with left arm and left neck swollen.Stated left arm warm to touch.No redness.No injury.Spoke to DOD Dr.Kelly he advised to see PCP.

## 2016-03-29 NOTE — Telephone Encounter (Signed)
New message       Patient's sister is calling stating patient's left arm is swollen. Patient noticed this swelling this am.  Someone told pt that this could be a cardiac problem.  Please call pt at [phone]

## 2016-03-30 ENCOUNTER — Ambulatory Visit (INDEPENDENT_AMBULATORY_CARE_PROVIDER_SITE_OTHER): Payer: 59 | Admitting: Internal Medicine

## 2016-03-30 ENCOUNTER — Encounter: Payer: Self-pay | Admitting: Internal Medicine

## 2016-03-30 VITALS — BP 140/82 | HR 62 | Temp 97.8°F | Wt 243.0 lb

## 2016-03-30 DIAGNOSIS — I890 Lymphedema, not elsewhere classified: Secondary | ICD-10-CM | POA: Diagnosis not present

## 2016-03-30 DIAGNOSIS — S71102D Unspecified open wound, left thigh, subsequent encounter: Secondary | ICD-10-CM | POA: Diagnosis not present

## 2016-03-30 NOTE — Progress Notes (Signed)
Subjective:    Patient ID: [name], female    DOB: Sep 11, 1935, 80 y.o.   MRN: [id number]  HPI  Pt presents to the clinic today with c/o left arm and neck swelling. This started 2 days ago. The ares is warm to touch but she denies redness or pain. She denies any injury to the area. She denies chest pain or shortness of breath. She does have a history of PMR and chronic pain. She follows with pain management. She takes Prednisone and MS Contin as prescribed.  She also c/o a rash on her left upper leg. She noticed this 2 weeks ago. She has not noticed any drainage from the area. It has not been red, warm or tender. She showed the rash to the wound doctor who was treating her cellulitis of her left lower extremity. He recommended that she put silver alginate dressing on it. He also referred her to a dermatologist because he "does not treat that sort of thing".   Review of Systems  Past Medical History:  Diagnosis Date  . Chronic pain    Managed by Dr. Hardin Negus  . Diverticulitis   . Hyperlipidemia   . Hypertension   . IBS (irritable bowel syndrome)   . Palpitations   . Polymyalgia rheumatica ([place])   . PVC's (premature ventricular contractions)   . SVT (supraventricular tachycardia) ([place])   . Tobacco dependence     Current Outpatient Prescriptions  Medication Sig Dispense Refill  . levothyroxine (SYNTHROID, LEVOTHROID) 50 MCG tablet Take 1 tablet (50 mcg total) by mouth daily. 90 tablet 0  . meclizine (ANTIVERT) 25 MG tablet TAKE 1 TABLET (25 MG TOTAL) BY MOUTH 3 (THREE) TIMES DAILY AS NEEDED FOR DIZZINESS. 30 tablet 2  . metoprolol tartrate (LOPRESSOR) 25 MG tablet TAKE 1 TABLET (25 MG TOTAL) BY MOUTH 2 (TWO) TIMES DAILY. 180 tablet 0  . morphine (MS CONTIN) 15 MG 12 hr tablet Take 1 tablet by mouth daily.    [name] morphine (MS CONTIN) 30 MG 12 hr tablet Take 1 tablet by mouth daily.    . mupirocin ointment (BACTROBAN) 2 % Apply 1 application topically 3 (three) times daily.  Apply to affected area (sore on your foot) three times daily 15 g 0  . pantoprazole (PROTONIX) 40 MG tablet TAKE 1 TABLET (40 MG TOTAL) BY MOUTH DAILY. 90 tablet 3  . predniSONE (DELTASONE) 5 MG tablet TAKE 1 TABLET BY MOUTH EVERY DAY WITH 1 MG TABLET TOTAL 8MG  0  . sertraline (ZOLOFT) 50 MG tablet TAKE 1 TABLET BY MOUTH EVERY DAY 90 tablet 3  . spironolactone (ALDACTONE) 25 MG tablet TAKE 1 TABLET (25 MG TOTAL) BY MOUTH DAILY. 90 tablet 1  . Wheat Dextrin (BENEFIBER PO) Take by mouth 2 (two) times daily.     No current facility-administered medications for this visit.     Allergies  Allergen Reactions  . Amoxicillin-Pot Clavulanate     REACTION: diarrhea  . Aspirin Nausea And Vomiting  . Atorvastatin     REACTION: feel weak  . Carisoprodol     REACTION: intolerant  . Codeine     REACTION: hallucinations  . Codeine Phosphate     REACTION: UNKNOWN  . Cortisone     REACTION: pt. reports allergy, reaction not known  . Ezetimibe-Simvastatin     REACTION: feels weak  . Naproxen Sodium     REACTION: GI symptoms  . Omeprazole     REACTION: abd pain  . Quinapril Hcl  REACTION: reports allergy, reaction not known  . Rofecoxib     REACTION: reports allergy, reaction not known    Family History  Problem Relation Age of Onset  . Diabetes Mother   . Heart attack Father   . Heart disease Sister     Social History   Social History  . Marital status: Married    Spouse name: N/A  . Number of children: 2  . Years of education: N/A   Occupational History  .  Retired   Social History Main Topics  . Smoking status: Former Smoker    Packs/day: 0.50    Quit date: 12/12/2011  . Smokeless tobacco: Never Used  . Alcohol use No  . Drug use: No  . Sexual activity: Not on file   Other Topics Concern  . Not on file   Social History Narrative  . No narrative on file     Constitutional: Denies fever, malaise, fatigue, headache or abrupt weight changes.  Respiratory: Denies  difficulty breathing, shortness of breath, cough or sputum production.   Cardiovascular: Pt reports swelling of her left upper extremity. Denies chest pain, chest tightness, palpitations or swelling in the hands.  Musculoskeletal: Pt report bilateral shoulder pain.  Skin: Denies redness, rashes, lesions or ulcercations.   No other specific complaints in a complete review of systems (except as listed in HPI above).     Objective:   Physical Exam  BP 140/82 (BP Location: Left Arm, Patient Position: Sitting, Cuff Size: Normal)   Pulse 62   Temp 97.8 F (36.6 C) (Oral)   Wt 243 lb (110.2 kg)   SpO2 97%   BMI 38.06 kg/m  Wt Readings from Last 3 Encounters:  03/30/16 243 lb (110.2 kg)  03/22/16 242 lb 1.6 oz (109.8 kg)  01/13/16 241 lb 8 oz (109.5 kg)    General: Appears her stated age, chronically ill appearing, in NAD. Skin: Warm, dry and intact. No redness or warmth of left arm noted. 2 1 cm irregularly shaped open wounds to left medial thigh. No drainage, redness or warmth noted. Neck:  Neck supple, trachea midline. No adenopathy noted. Cardiovascular: Normal rate and rhythm. 1+ edema of left supraclavicular area extending all the way down to fingertips of left hand. No JVD noted. Pulmonary/Chest: Normal effort and positive vesicular breath sounds. No respiratory distress. No wheezes, rales or ronchi noted.  Musculoskeletal: Decreased internal and external rotation of the left shoulder due to pain.   Neurological: Sensation intact to LUE.  BMET    Component Value Date/Time   NA 141 03/22/2016 1520   K 3.6 03/22/2016 1520   CL 97 (L) 09/22/2015 0938   CO2 31 (H) 03/22/2016 1520   GLUCOSE 100 03/22/2016 1520   BUN 13.6 03/22/2016 1520   CREATININE 0.9 03/22/2016 1520   CALCIUM 9.6 03/22/2016 1520   GFRNONAA >60 12/11/2010 0436   GFRAA  12/11/2010 0436    >60        The eGFR has been calculated using the MDRD equation. This calculation has not been validated in all  clinical situations. eGFR's persistently <60 mL/min signify possible Chronic Kidney Disease.    Lipid Panel     Component Value Date/Time   CHOL 190 04/17/2014 1701   TRIG 124.0 04/17/2014 1701   HDL 37.60 (L) 04/17/2014 1701   CHOLHDL 5 04/17/2014 1701   VLDL 24.8 04/17/2014 1701   LDLCALC 128 (H) 04/17/2014 1701    CBC    Component Value Date/Time  WBC 7.7 03/22/2016 1520   WBC 7.3 09/11/2015 1200   RBC 3.83 03/22/2016 1520   RBC 4.51 09/11/2015 1200   HGB 11.6 03/22/2016 1520   HCT 36.2 03/22/2016 1520   PLT 211 03/22/2016 1520   MCV 94.5 03/22/2016 1520   MCH 30.3 03/22/2016 1520   MCH 29.7 12/11/2010 0436   MCHC 32.0 03/22/2016 1520   MCHC 32.0 09/11/2015 1200   RDW 16.1 (H) 03/22/2016 1520   LYMPHSABS 3.3 03/22/2016 1520   MONOABS 0.6 03/22/2016 1520   EOSABS 0.1 03/22/2016 1520   BASOSABS 0.0 03/22/2016 1520    Hgb A1C No results found for: HGBA1C          Assessment & Plan:   Lymphedema of LUE:  Not really sure what would be causing this Encouraged elevation Increase Spironolactone to 25 mg BID x 3 days If persist or worsens, we can wrap with an ACE wrap for compression  Would of left upper thigh:  Not infected Surprised that the wound center referred her to dermatology Continue Silver Alginate dressings until she see derm or follows up with PCP  RTC as needed or if symptoms persist or worsen Lilianne Delair, NP

## 2016-03-30 NOTE — Patient Instructions (Signed)
Edema °Edema is an abnormal buildup of fluids in your body tissues. Edema is somewhat dependent on gravity to pull the fluid to the lowest place in your body. That makes the condition more common in the legs and thighs (lower extremities). Painless swelling of the feet and ankles is common and becomes more likely as you get older. It is also common in looser tissues, like around your eyes.  °When the affected area is squeezed, the fluid may move out of that spot and leave a dent for a few moments. This dent is called pitting.  °CAUSES  °There are many possible causes of edema. Eating too much salt and being on your feet or sitting for a long time can cause edema in your legs and ankles. Hot weather may make edema worse. Common medical causes of edema include: °· Heart failure. °· Liver disease. °· Kidney disease. °· Weak blood vessels in your legs. °· Cancer. °· An injury. °· Pregnancy. °· Some medications. °· Obesity.  °SYMPTOMS  °Edema is usually painless. Your skin may look swollen or shiny.  °DIAGNOSIS  °Your health care provider may be able to diagnose edema by asking about your medical history and doing a physical exam. You may need to have tests such as X-rays, an electrocardiogram, or blood tests to check for medical conditions that may cause edema.  °TREATMENT  °Edema treatment depends on the cause. If you have heart, liver, or kidney disease, you need the treatment appropriate for these conditions. General treatment may include: °· Elevation of the affected body part above the level of your heart. °· Compression of the affected body part. Pressure from elastic bandages or support stockings squeezes the tissues and forces fluid back into the blood vessels. This keeps fluid from entering the tissues. °· Restriction of fluid and salt intake. °· Use of a water pill (diuretic). These medications are appropriate only for some types of edema. They pull fluid out of your body and make you urinate more often. This  gets rid of fluid and reduces swelling, but diuretics can have side effects. Only use diuretics as directed by your health care provider. °HOME CARE INSTRUCTIONS  °· Keep the affected body part above the level of your heart when you are lying down.   °· Do not sit still or stand for prolonged periods.   °· Do not put anything directly under your knees when lying down. °· Do not wear constricting clothing or garters on your upper legs.   °· Exercise your legs to work the fluid back into your blood vessels. This may help the swelling go down.   °· Wear elastic bandages or support stockings to reduce ankle swelling as directed by your health care provider.   °· Eat a low-salt diet to reduce fluid if your health care provider recommends it.   °· Only take medicines as directed by your health care provider.  °SEEK MEDICAL CARE IF:  °· Your edema is not responding to treatment. °· You have heart, liver, or kidney disease and notice symptoms of edema. °· You have edema in your legs that does not improve after elevating them.   °· You have sudden and unexplained weight gain. °SEEK IMMEDIATE MEDICAL CARE IF:  °· You develop shortness of breath or chest pain.   °· You cannot breathe when you lie down. °· You develop pain, redness, or warmth in the swollen areas.   °· You have heart, liver, or kidney disease and suddenly get edema. °· You have a fever and your symptoms suddenly get worse. °MAKE SURE YOU:  °·   Understand these instructions. °· Will watch your condition. °· Will get help right away if you are not doing well or get worse. °  °This information is not intended to replace advice given to you by your health care provider. Make sure you discuss any questions you have with your health care provider. °  °Document Released: 08/09/2005 Document Revised: 08/30/2014 Document Reviewed: 06/01/2013 °Elsevier Interactive Patient Education ©2016 [employer] ° °

## 2016-03-30 NOTE — Progress Notes (Signed)
Pre visit review using our clinic review tool, if applicable. No additional management support is needed unless otherwise documented below in the visit note. 

## 2016-04-01 ENCOUNTER — Encounter: Payer: Self-pay | Admitting: *Deleted

## 2016-04-01 NOTE — Progress Notes (Signed)
Rn contacted patient to inform her about MRI abdomen/General surgery. Patient states that the earliest she can get an appointment due to her schedule is in October. RN gave patient the central scheduling number to set up MRI (abdomen) appointment.

## 2016-04-02 ENCOUNTER — Ambulatory Visit (INDEPENDENT_AMBULATORY_CARE_PROVIDER_SITE_OTHER)
Admission: RE | Admit: 2016-04-02 | Discharge: 2016-04-02 | Disposition: A | Discharge status: Home / Self Care | Payer: 59 | Source: Ambulatory Visit | Attending: Family Medicine | Admitting: Family Medicine

## 2016-04-02 ENCOUNTER — Ambulatory Visit (INDEPENDENT_AMBULATORY_CARE_PROVIDER_SITE_OTHER): Payer: 59 | Admitting: Family Medicine

## 2016-04-02 ENCOUNTER — Encounter: Payer: Self-pay | Admitting: Family Medicine

## 2016-04-02 VITALS — BP 126/76 | HR 59 | Temp 98.4°F | Ht 67.0 in | Wt 242.8 lb

## 2016-04-02 DIAGNOSIS — E669 Obesity, unspecified: Secondary | ICD-10-CM

## 2016-04-02 DIAGNOSIS — R062 Wheezing: Secondary | ICD-10-CM | POA: Insufficient documentation

## 2016-04-02 DIAGNOSIS — S71102D Unspecified open wound, left thigh, subsequent encounter: Secondary | ICD-10-CM | POA: Diagnosis not present

## 2016-04-02 DIAGNOSIS — N3946 Mixed incontinence: Secondary | ICD-10-CM

## 2016-04-02 DIAGNOSIS — I890 Lymphedema, not elsewhere classified: Secondary | ICD-10-CM

## 2016-04-02 DIAGNOSIS — E039 Hypothyroidism, unspecified: Secondary | ICD-10-CM

## 2016-04-02 DIAGNOSIS — I10 Essential (primary) hypertension: Secondary | ICD-10-CM

## 2016-04-02 LAB — COMPREHENSIVE METABOLIC PANEL
ALT: 7 U/L (ref 6–29)
AST: 15 U/L (ref 10–35)
Albumin: 4 g/dL (ref 3.6–5.1)
Alkaline Phosphatase: 61 U/L (ref 33–130)
BILIRUBIN TOTAL: 0.5 mg/dL (ref 0.2–1.2)
BUN: 20 mg/dL (ref 7–25)
CO2: 29 mmol/L (ref 20–31)
CREATININE: 0.99 mg/dL — AB (ref 0.60–0.93)
Calcium: 9.4 mg/dL (ref 8.6–10.4)
Chloride: 101 mmol/L (ref 98–110)
GLUCOSE: 95 mg/dL (ref 65–99)
Potassium: 4.3 mmol/L (ref 3.5–5.3)
SODIUM: 139 mmol/L (ref 135–146)
Total Protein: 6.7 g/dL (ref 6.1–8.1)

## 2016-04-02 LAB — TSH: TSH: 130.75 mIU/L — ABNORMAL HIGH

## 2016-04-02 NOTE — Progress Notes (Signed)
Pre visit review using our clinic review tool, if applicable. No additional management support is needed unless otherwise documented below in the visit note. 

## 2016-04-02 NOTE — Progress Notes (Signed)
Subjective:    Patient ID: [name], female    DOB: 1936-08-04, 80 y.o.   MRN: [id number]  HPI  Here for f/u of acute and chronic medical problems   Wt Readings from Last 3 Encounters:  04/02/16 242 lb 12 oz (110.1 kg)  03/30/16 243 lb (110.2 kg)  03/22/16 242 lb 1.6 oz (109.8 kg)  this is stable Avoids salt and salty foods  bmi is 38  bp is stable today  No cp or palpitations or headaches or edema  No side effects to medicines  BP Readings from Last 3 Encounters:  04/02/16 126/76  03/30/16 140/82  03/22/16 132/81       Chemistry      Component Value Date/Time   NA 141 03/22/2016 1520   K 3.6 03/22/2016 1520   CL 97 (L) 09/22/2015 0938   CO2 31 (H) 03/22/2016 1520   BUN 13.6 03/22/2016 1520   CREATININE 0.9 03/22/2016 1520      Component Value Date/Time   CALCIUM 9.6 03/22/2016 1520   ALKPHOS 75 03/22/2016 1520   AST 14 03/22/2016 1520   ALT <9 03/22/2016 1520   BILITOT 0.50 03/22/2016 1520       Was seen earlier this mo by [facility] for L arm and neck swelling and rash on L upper leg  Her wound doctor had her use a silver alginate dressing on it  Also ref to dermatologist It is not as bad as it was   Dx with lymphedema of LUE Unsure of cause Inc her spironolactone to 25 mg bid for 3d to help rid excess fluid and told to use ace wrap if needed (has not tried) Improved but still swollen- it goes up and down (worse at the end of the day) when her shoulders hurt  Her L foot swells   Is seen by pain management-Dr Hardin Negus Also Dr Garen Grams for PMR  On oncol  for kappa monoclonal gammopathy - they watch cbc    Has gen surg consult for an abn appearing gallbladder  She has it for Oct 3rd  She decided against the MRI and will just see the surgeon   Now on levothyroxine Lab Results  Component Value Date   TSH 19.80 (H) 02/19/2015   on levothyroxine - now and will need a re check   Patient Active Problem List   Diagnosis Date Noted  . Lymphedema of  arm 04/02/2016  . Open wound of left thigh 04/02/2016  . Urinary incontinence 04/02/2016  . Wheeze 04/02/2016  . Foot ulcer ([place]) 01/13/2016  . Cellulitis of leg, left 01/05/2016  . Pedal edema 11/27/2015  . MGUS (monoclonal gammopathy of unknown significance) 11/18/2015  . Liver lesion 11/18/2015  . Left foot pain 10/10/2015  . Leg edema, left 09/22/2015  . Acute joint pain 10/14/2014  . Right foot pain 08/05/2014  . Right shoulder pain 08/05/2014  . Microscopic hematuria 05/21/2014  . PMR (polymyalgia rheumatica) ([place]) 05/15/2014  . Elevated sed rate 04/30/2014  . UTI (urinary tract infection) 04/30/2014  . Other malaise and fatigue 04/17/2014  . Diverticulosis 04/17/2014  . Bilateral arm pain 04/17/2014  . Abdominal pain, LLQ 04/17/2014  . Former smoker 12/12/2012  . Muscle cramps 07/12/2012  . Depression 04/19/2011  . Pre-syncope 03/08/2011  . OBESITY 10/03/2007  . VERTIGO 04/11/2007  . Hypothyroidism 12/27/2006  . HYPERCHOLESTEROLEMIA 12/27/2006  . Essential hypertension 12/27/2006  . SVT (supraventricular tachycardia) ([place]) 12/27/2006  . ALLERGIC RHINITIS 12/27/2006  . COPD 12/27/2006  .  OSTEOARTHRITIS 12/27/2006  . Chronic back pain 12/27/2006   Past Medical History:  Diagnosis Date  . Chronic pain    Managed by Dr. Hardin Negus  . Diverticulitis   . Hyperlipidemia   . Hypertension   . IBS (irritable bowel syndrome)   . Palpitations   . Polymyalgia rheumatica ([place])   . PVC's (premature ventricular contractions)   . SVT (supraventricular tachycardia) ([place])   . Tobacco dependence    Past Surgical History:  Procedure Laterality Date  . NASAL SINUS SURGERY    . REPLACEMENT TOTAL KNEE    . TONSILLECTOMY    . WRIST FRACTURE SURGERY     Social History  Substance Use Topics  . Smoking status: Former Smoker    Packs/day: 0.50    Quit date: 12/12/2011  . Smokeless tobacco: Never Used  . Alcohol use No   Family History  Problem Relation Age of Onset  .  Diabetes Mother   . Heart attack Father   . Heart disease Sister    Allergies  Allergen Reactions  . Amoxicillin-Pot Clavulanate     REACTION: diarrhea  . Aspirin Nausea And Vomiting  . Atorvastatin     REACTION: feel weak  . Carisoprodol     REACTION: intolerant  . Codeine     REACTION: hallucinations  . Codeine Phosphate     REACTION: UNKNOWN  . Cortisone     REACTION: pt. reports allergy, reaction not known  . Ezetimibe-Simvastatin     REACTION: feels weak  . Naproxen Sodium     REACTION: GI symptoms  . Omeprazole     REACTION: abd pain  . Quinapril Hcl     REACTION: reports allergy, reaction not known  . Rofecoxib     REACTION: reports allergy, reaction not known   Current Outpatient Prescriptions on File Prior to Visit  Medication Sig Dispense Refill  . levothyroxine (SYNTHROID, LEVOTHROID) 50 MCG tablet Take 1 tablet (50 mcg total) by mouth daily. 90 tablet 0  . meclizine (ANTIVERT) 25 MG tablet TAKE 1 TABLET (25 MG TOTAL) BY MOUTH 3 (THREE) TIMES DAILY AS NEEDED FOR DIZZINESS. 30 tablet 2  . metoprolol tartrate (LOPRESSOR) 25 MG tablet TAKE 1 TABLET (25 MG TOTAL) BY MOUTH 2 (TWO) TIMES DAILY. 180 tablet 0  . morphine (MS CONTIN) 15 MG 12 hr tablet Take 1 tablet by mouth daily.    [name] morphine (MS CONTIN) 30 MG 12 hr tablet Take 1 tablet by mouth daily.    . mupirocin ointment (BACTROBAN) 2 % Apply 1 application topically 3 (three) times daily. Apply to affected area (sore on your foot) three times daily 15 g 0  . pantoprazole (PROTONIX) 40 MG tablet TAKE 1 TABLET (40 MG TOTAL) BY MOUTH DAILY. 90 tablet 3  . predniSONE (DELTASONE) 5 MG tablet TAKE 1 TABLET BY MOUTH EVERY DAY WITH 1 MG TABLET TOTAL 8MG   0  . sertraline (ZOLOFT) 50 MG tablet TAKE 1 TABLET BY MOUTH EVERY DAY 90 tablet 3  . spironolactone (ALDACTONE) 25 MG tablet TAKE 1 TABLET (25 MG TOTAL) BY MOUTH DAILY. 90 tablet 1  . Wheat Dextrin (BENEFIBER PO) Take by mouth 2 (two) times daily.     No current  facility-administered medications on file prior to visit.     Review of Systems    Review of Systems  Constitutional: Negative for fever, appetite change,  and unexpected weight change.  Eyes: Negative for pain and visual disturbance. pos for gen faitgue Respiratory: Negative for cough  and shortness of breath.  pos for occ wheezing Cardiovascular: Negative for cp or palpitations    Gastrointestinal: Negative for nausea, diarrhea and constipation.  Genitourinary: pos for symptoms of mixed incontinence  Skin: Negative for pallor or rash  pos for wound of L inner thigh  Neurological: Negative for weakness, light-headedness, numbness and headaches.  Hematological: Negative for adenopathy. Does not bruise/bleed easily.  Psychiatric/Behavioral: Negative for dysphoric mood. The patient is sometimes nervous/anxious.      Objective:   Physical Exam  Constitutional: She appears well-developed and well-nourished. No distress.  obese and well appearing   HENT:  Head: Normocephalic and atraumatic.  Mouth/Throat: Oropharynx is clear and moist.  Nares are boggy  Eyes: Conjunctivae and EOM are normal. Pupils are equal, round, and reactive to light.  Neck: Normal range of motion. Neck supple. No JVD present. Carotid bruit is not present. No thyromegaly present.  Cardiovascular: Normal rate, regular rhythm, normal heart sounds and intact distal pulses.  Exam reveals no gallop.   Venous insuff of legs noted  Pulmonary/Chest: Effort normal and breath sounds normal. No respiratory distress. She has no wheezes. She has no rales. She exhibits no tenderness.  No crackles Diffusely distant bs  No wheeze today  Abdominal: Soft. Bowel sounds are normal. She exhibits no distension, no abdominal bruit and no mass. There is no tenderness.  Musculoskeletal: She exhibits edema.  Edema of L arm- esp lower forearm and dorsal hand w/o signs of injury  Some inc edema of L lower leg and dorsal foot as well      Lymphadenopathy:    She has no cervical adenopathy.  Neurological: She is alert. She has normal reflexes. No cranial nerve deficit. She exhibits normal muscle tone. Coordination normal.  Skin: Skin is warm and dry. No rash noted.  Inner L thigh- several areas of shallow healing ulceration with white base and little to no erythema or drainage   No vaginal wounds /lesions or d/c   Psychiatric: She has a normal mood and affect.          Assessment & Plan:   Problem List Items Addressed This Visit      Cardiovascular and Mediastinum   Essential hypertension - Primary    bp in fair control at this time  BP Readings from Last 1 Encounters:  04/02/16 126/76   No changes needed Disc lifstyle change with low sodium diet and exercise  Labs reviewed        Relevant Orders   Comprehensive metabolic panel (Completed)     Endocrine   Hypothyroidism    Now on levothyroxine for just over a month TSH today  Some edema- but this is focal      Relevant Orders   TSH (Completed)     Other   Wheeze    Not on exam today Hx of copd/former smoker cxr today      Relevant Orders   DG Chest 2 View (Completed)   Urinary incontinence    Will change from pad to undergarment to prevent friction/ protect inner thigh wound      Open wound of left thigh    L inner thigh- suspect due to friction from wearing 2 urinary incont pads at once  Suggested switching to an adult undergarment like depends  Continue the current dressing  Will watch for s/s of infection or if no further improvement       OBESITY    Discussed how this problem influences overall health and  the risks it imposes  Reviewed plan for weight loss with lower calorie diet (via better food choices and also portion control or program like weight watchers) and exercise building up to or more than 30 minutes 5 days per week including some aerobic activity   Exercise is limited by chronic pain      Lymphedema of arm     Unsure of etiology Enc pt to get a screening mammogram (no adenopathy felt) cxr today-former smoker also with copd No hx of arm/breast surgery  May need vasc eval /she will also bring up to cardiology       Relevant Orders   DG Chest 2 View (Completed)    Other Visit Diagnoses   None.

## 2016-04-02 NOTE — Patient Instructions (Addendum)
See the surgeon for your gallbladder issue as planned  Thyroid test today  Call Dr Dartha Lodge office and let them know you think your PMR is worse (more pain in arms)  Mention the swollen arm to your cardiologist  Chest xray today today - since you complained of wheezing and also due to the arm lymphedema   Make sure to get yearly mammograms   Keep dressing the wound on the inner thigh with the alginate pads and keep clean and dry Think your incontinence pads are worsening the problem- switch to an adult undergarment like depends or another band to see if that cuts down on friction and helps you heal

## 2016-04-04 NOTE — Assessment & Plan Note (Signed)
Not on exam today Hx of copd/former smoker cxr today

## 2016-04-04 NOTE — Assessment & Plan Note (Signed)
L inner thigh- suspect due to friction from wearing 2 urinary incont pads at once  Suggested switching to an adult undergarment like depends  Continue the current dressing  Will watch for s/s of infection or if no further improvement

## 2016-04-04 NOTE — Assessment & Plan Note (Signed)
Will change from pad to undergarment to prevent friction/ protect inner thigh wound

## 2016-04-04 NOTE — Assessment & Plan Note (Signed)
Now on levothyroxine for just over a month TSH today  Some edema- but this is focal

## 2016-04-04 NOTE — Assessment & Plan Note (Signed)
Unsure of etiology Enc pt to get a screening mammogram (no adenopathy felt) cxr today-former smoker also with copd No hx of arm/breast surgery  May need vasc eval /she will also bring up to cardiology

## 2016-04-04 NOTE — Assessment & Plan Note (Signed)
bp in fair control at this time  BP Readings from Last 1 Encounters:  04/02/16 126/76   No changes needed Disc lifstyle change with low sodium diet and exercise  Labs reviewed

## 2016-04-04 NOTE — Assessment & Plan Note (Signed)
Discussed how this problem influences overall health and the risks it imposes  Reviewed plan for weight loss with lower calorie diet (via better food choices and also portion control or program like weight watchers) and exercise building up to or more than 30 minutes 5 days per week including some aerobic activity   Exercise is limited by chronic pain

## 2016-04-05 ENCOUNTER — Telehealth: Payer: Self-pay | Admitting: Cardiology

## 2016-04-05 DIAGNOSIS — M7989 Other specified soft tissue disorders: Secondary | ICD-10-CM

## 2016-04-05 NOTE — Telephone Encounter (Signed)
Pt c/o swelling: STAT is pt has developed SOB within 24 hours  1. How long have you been experiencing swelling? 3 WEEKS 2. Where is the swelling located? LEFT ARM  3.  Are you currently taking a "fluid pill"? YES  4.  Are you currently SOB? NO 5.  Have you traveled recently? NO

## 2016-04-05 NOTE — Telephone Encounter (Signed)
Patient has had left arm swelling noticeable for several weeks now. She was just seen by PCP last week to advise on this. She is having swelling, but denies having any pain, SOB, or other symptoms. Recommendations were for CXR (normal findings) and mammography (pending). She was also recommended to follow up w/ [place] for possible recommendation of vascular tests. Pt aware she had recent LE vascular studies. Mentioned a wound on left leg that is being followed by PCP. She denies any wound or known cause of the left arm swelling.  Patient aware to keep on schedule for mammography, and that I will see if anything advised by Dr. Martinique as far as further testing. She voiced thanks for return call.

## 2016-04-08 ENCOUNTER — Telehealth: Payer: Self-pay | Admitting: *Deleted

## 2016-04-08 MED ORDER — LEVOTHYROXINE SODIUM 125 MCG PO TABS: 125.0000 ug | ORAL_TABLET | Freq: Every day | ORAL | 5 refills | Status: DC

## 2016-04-08 NOTE — Telephone Encounter (Signed)
-----   Message from Abner Greenspan, MD sent at 04/04/2016 11:50 AM EDT ----- Need to go up on thyroid medicine She is currently on 50 mcg (please verify she is taking it)  Since TSH is so high I am going to go up to 125 mcg daily   (please call in levothyroxine 125 mcg 1 po qd #30 5 ref)  Alert me if any problems  Re check tsh in about 3 weeks please

## 2016-04-08 NOTE — Telephone Encounter (Signed)
Pt notified of lab results and Dr. Marliss Coots comments. Pt is taking 62mcg of med so new dose of Rx sent to pharmacy and lab appt scheduled

## 2016-04-11 NOTE — Telephone Encounter (Signed)
We can do upper extremity venous doppler to rule out DVT of her arm.  Lynn Aden Martinique MD, [facility]

## 2016-04-12 NOTE — Telephone Encounter (Signed)
Returned call to patient Dr.Jordan's recommendations given.Scheduler will call back to schedule venous doppler left arm.

## 2016-04-13 ENCOUNTER — Ambulatory Visit (HOSPITAL_COMMUNITY)
Admission: RE | Admit: 2016-04-13 | Discharge: 2016-04-13 | Disposition: A | Discharge status: Home / Self Care | Payer: 59 | Source: Ambulatory Visit | Attending: Cardiovascular Disease | Admitting: Cardiovascular Disease

## 2016-04-13 DIAGNOSIS — M7989 Other specified soft tissue disorders: Secondary | ICD-10-CM | POA: Diagnosis present

## 2016-04-13 DIAGNOSIS — Z720 Tobacco use: Secondary | ICD-10-CM | POA: Insufficient documentation

## 2016-04-13 DIAGNOSIS — I10 Essential (primary) hypertension: Secondary | ICD-10-CM | POA: Diagnosis not present

## 2016-04-13 DIAGNOSIS — E785 Hyperlipidemia, unspecified: Secondary | ICD-10-CM | POA: Diagnosis not present

## 2016-04-30 ENCOUNTER — Other Ambulatory Visit: Payer: Self-pay | Admitting: Family Medicine

## 2016-04-30 ENCOUNTER — Ambulatory Visit: Payer: 59 | Admitting: Family Medicine

## 2016-04-30 DIAGNOSIS — E038 Other specified hypothyroidism: Secondary | ICD-10-CM

## 2016-05-03 ENCOUNTER — Telehealth: Payer: Self-pay

## 2016-05-03 ENCOUNTER — Other Ambulatory Visit: Payer: Self-pay | Admitting: Family Medicine

## 2016-05-03 NOTE — Telephone Encounter (Signed)
Pt requesting refill levothyroxine 125 mcg. Spoke with [facility] [address] and rx ready for pic up. Pt voiced understanding.

## 2016-05-06 ENCOUNTER — Ambulatory Visit (INDEPENDENT_AMBULATORY_CARE_PROVIDER_SITE_OTHER): Payer: 59

## 2016-05-06 ENCOUNTER — Other Ambulatory Visit: Payer: 59

## 2016-05-06 ENCOUNTER — Other Ambulatory Visit (INDEPENDENT_AMBULATORY_CARE_PROVIDER_SITE_OTHER): Payer: 59

## 2016-05-06 DIAGNOSIS — E038 Other specified hypothyroidism: Secondary | ICD-10-CM | POA: Diagnosis not present

## 2016-05-06 DIAGNOSIS — Z23 Encounter for immunization: Secondary | ICD-10-CM | POA: Diagnosis not present

## 2016-05-06 LAB — TSH: TSH: 0.32 u[IU]/mL — ABNORMAL LOW (ref 0.35–4.50)

## 2016-05-10 ENCOUNTER — Telehealth: Payer: Self-pay | Admitting: *Deleted

## 2016-05-10 MED ORDER — LEVOTHYROXINE SODIUM 112 MCG PO TABS: 112.0000 ug | ORAL_TABLET | Freq: Every day | ORAL | 11 refills | Status: DC

## 2016-05-10 NOTE — Telephone Encounter (Signed)
Pt notified of lab results and Dr. Tower's comments Rx sent to pharmacy and lab appt scheduled  

## 2016-05-10 NOTE — Telephone Encounter (Signed)
-----   Message from Abner Greenspan, MD sent at 05/06/2016  4:24 PM EDT ----- Much better overall- we over shot it just a bit - so I want to decrease dose from 125 to 112 mcg daily - that should get her therapeutic I hope she is feeling well  Please call in levothyroxine 112 mcg 1 po qd #30 11 ref Please re check tsh in about 6-8 weeks

## 2016-05-18 NOTE — Telephone Encounter (Signed)
Not unless her TSH is too high- (if she gets too much thyroid hormone that is bad for heart/blood pressure and bone density) I know she changed her dose recently  Stay on the same dose and schedule a TSH next week - we will see what it looks like Thanks for letting me know

## 2016-05-18 NOTE — Telephone Encounter (Signed)
Pt left v/m; since thyroid dosage has changed pts hand had started to swell again and pt wants to know if can resume previous dose of thyroid. Pt request cb.

## 2016-05-18 NOTE — Telephone Encounter (Signed)
Per DPR left voicemail letting pt know Dr. Marliss Coots comments and to call office back to schedule a lab appt to have TSH checked

## 2016-05-25 ENCOUNTER — Other Ambulatory Visit (INDEPENDENT_AMBULATORY_CARE_PROVIDER_SITE_OTHER): Payer: 59

## 2016-05-25 DIAGNOSIS — E038 Other specified hypothyroidism: Secondary | ICD-10-CM

## 2016-05-26 LAB — TSH: TSH: 0.21 u[IU]/mL — AB (ref 0.35–4.50)

## 2016-05-28 ENCOUNTER — Other Ambulatory Visit: Payer: Self-pay

## 2016-05-28 MED ORDER — LEVOTHYROXINE SODIUM 100 MCG PO TABS
100.0000 ug | ORAL_TABLET | Freq: Every day | ORAL | 11 refills | Status: DC
Start: 2016-05-28 — End: 2016-06-24

## 2016-05-28 NOTE — Telephone Encounter (Signed)
R/X called in to pharmacy for new dosing on Levothyroxine decreased to 148mcg po qd #30, 11 rf per Dr. Alba Cory order.  Please refer to TSH level drawn on 05/26/16 for details.

## 2016-05-31 NOTE — Telephone Encounter (Signed)
Kelly with [facility] [address] [facility] called to verify dose of levothyroxine pt is to take. Advised per 05/25/16 result note levothyroxine 100 mcg taking one daily; Claiborne Billings will d/c other strengths of levothyroxine to avoid future issues. Claiborne Billings voiced understanding.

## 2016-06-01 ENCOUNTER — Telehealth: Payer: Self-pay | Admitting: *Deleted

## 2016-06-01 NOTE — Telephone Encounter (Signed)
FYI "April with [facility].  We have seen this patient who has called [place] wanting to proceed with MRI and surgery.  Can we use the MRI order already in EPIC?"  Advised they enter their own MRI order.  This order was expected 03-30-2016, not authorized or scheduled and expected performance date has passed.  No further questions.

## 2016-06-02 ENCOUNTER — Telehealth: Payer: Self-pay

## 2016-06-02 NOTE — Telephone Encounter (Signed)
Pt last seen 04/02/16; when started to take levothyroxine 100 mcg left hand is swelling again. Swelling had gone done on previous dose of thyroid med.pt request cb. [facility] [address]

## 2016-06-02 NOTE — Telephone Encounter (Signed)
Ok-go back to the 112 mcg and stay on that (hold on to the 100 mcg but don't get rid of them)  Lets see if the swelling improves Let me know if neck is swollen at all  Please change dose in chart back to 112 mcg daily   Next TSH-keep as scheduled  thanks

## 2016-06-03 ENCOUNTER — Other Ambulatory Visit: Payer: Self-pay | Admitting: General Surgery

## 2016-06-03 DIAGNOSIS — R160 Hepatomegaly, not elsewhere classified: Secondary | ICD-10-CM

## 2016-06-03 NOTE — Telephone Encounter (Signed)
Spoke to pt and advised per Dr Glori Bickers. Med list updated to reflect changes

## 2016-06-07 ENCOUNTER — Telehealth: Payer: Self-pay | Admitting: Family Medicine

## 2016-06-07 NOTE — Telephone Encounter (Signed)
Pt called for advice regarding the ulcers in her private area.  She said she has been going to the dermatologist and they cant do anything more for her.  She said that she might need to go back to the wound center.  She wants to know what to do.  Please call her at [phone]

## 2016-06-07 NOTE — Telephone Encounter (Signed)
Please send for last several dermatology notes and return this to me when they get here-thanks , I will review them

## 2016-06-08 NOTE — Telephone Encounter (Signed)
Left voicemail with medical records requesting OV notes on pt

## 2016-06-11 ENCOUNTER — Encounter: Payer: Self-pay | Admitting: Family Medicine

## 2016-06-11 ENCOUNTER — Ambulatory Visit (INDEPENDENT_AMBULATORY_CARE_PROVIDER_SITE_OTHER): Payer: 59 | Admitting: Family Medicine

## 2016-06-11 VITALS — BP 122/64 | HR 105 | Temp 97.7°F | Ht 67.0 in | Wt 223.5 lb

## 2016-06-11 DIAGNOSIS — N3946 Mixed incontinence: Secondary | ICD-10-CM | POA: Diagnosis not present

## 2016-06-11 DIAGNOSIS — E039 Hypothyroidism, unspecified: Secondary | ICD-10-CM | POA: Diagnosis not present

## 2016-06-11 DIAGNOSIS — S71102D Unspecified open wound, left thigh, subsequent encounter: Secondary | ICD-10-CM | POA: Diagnosis not present

## 2016-06-11 LAB — TSH: TSH: 0.04 mIU/L — ABNORMAL LOW

## 2016-06-11 MED ORDER — OXYBUTYNIN CHLORIDE 5 MG PO TABS: 5.0000 mg | ORAL_TABLET | Freq: Two times a day (BID) | ORAL | 11 refills | Status: DC

## 2016-06-11 NOTE — Assessment & Plan Note (Signed)
Pt wants to try an oral medication for urge incontinence  Disc poss side eff incl dry mouth/constipation/ sedation/dizzinesss and she will try with caution  Px oxybutinin 5 mg bid   Will update with response   She did not do well with incontinence undergarments due to elastic portion in crease of thigh Currently using a folded soft surgical gauze tucked into underwear to minimalize friction

## 2016-06-11 NOTE — Progress Notes (Signed)
Pre visit review using our clinic review tool, if applicable. No additional management support is needed unless otherwise documented below in the visit note. 

## 2016-06-11 NOTE — Telephone Encounter (Signed)
Dr. Glori Bickers has OV notes from [facility] and pt is being seen today

## 2016-06-11 NOTE — Patient Instructions (Addendum)
If the silvadene ointment bothers your skin then use the [place] Honey ointment from [facility]  Use the softest pad possible Keep your appointment with the wound care center  Keep cleaning with soap and water  For incontinence try the ditropan 5 mg (oxybutynin) twice daily Possible side effects include dry mouth/constipation / dizziness/sleepiness  If those are not tolerable - stop the medication  Use caution   Thyroid test today

## 2016-06-11 NOTE — Progress Notes (Signed)
Subjective:    Patient ID: [name], female    DOB: 03/25/1936, 80 y.o.   MRN: [id number]  HPI Here for f/u of skin ulcers   She has been going to [facility]-sees Dr Amy Martinique Last note (reviewed with pt) is from 05/25/16 She had superficial cutaneous breakdown located on L medial gluteal fold Dx with ulcer most likely due to irritation and pressure (sitting) Was tx with silvadene cream 1% applied with duoderm to change every 72 hours  Not stated she was inst to f/u in 2 wk   The duoderm "tore me up and made me bleed"  Hurt very badly taking it off  Stopped the duo derm and started using vaseline instead of silvadene because it was bothering her "it hurt pretty bad"   She called to follow up and said that an employee told her that they couldn't help her any more   The wound care center in the past recommended manuka honey ointment - she bought it over the counter and it helped   (last visit was 2 mo ago)   The visit before that indicated a similar area on L superior medial anterior thigh  tx with silvadene  Of note pt is on prednisone for PMR  After last visit here - we recommended a depends undergarment instead of a pad -did not make any difference at all  Rubs her in the L thigh area   She has urine incontinence  Urge incontinence  She thinks she could handle the side eff of dry mouth /sleepiness     Wt Readings from Last 3 Encounters:  06/11/16 223 lb 8 oz (101.4 kg)  04/02/16 242 lb 12 oz (110.1 kg)  03/30/16 243 lb (110.2 kg)   bmi is 35.01  Due for a thyroid draw next month-wants to do it today if she can given the number of appts she has   Patient Active Problem List   Diagnosis Date Noted  . Lymphedema of arm 04/02/2016  . Open wound of left thigh 04/02/2016  . Urinary incontinence 04/02/2016  . Wheeze 04/02/2016  . Foot ulcer ([place]) 01/13/2016  . Cellulitis of leg, left 01/05/2016  . Pedal edema 11/27/2015  . MGUS (monoclonal  gammopathy of unknown significance) 11/18/2015  . Liver lesion 11/18/2015  . Left foot pain 10/10/2015  . Leg edema, left 09/22/2015  . Acute joint pain 10/14/2014  . Right foot pain 08/05/2014  . Right shoulder pain 08/05/2014  . Microscopic hematuria 05/21/2014  . PMR (polymyalgia rheumatica) ([place]) 05/15/2014  . Elevated sed rate 04/30/2014  . UTI (urinary tract infection) 04/30/2014  . Other malaise and fatigue 04/17/2014  . Diverticulosis 04/17/2014  . Bilateral arm pain 04/17/2014  . Abdominal pain, LLQ 04/17/2014  . Former smoker 12/12/2012  . Muscle cramps 07/12/2012  . Depression 04/19/2011  . Pre-syncope 03/08/2011  . OBESITY 10/03/2007  . VERTIGO 04/11/2007  . Hypothyroidism 12/27/2006  . HYPERCHOLESTEROLEMIA 12/27/2006  . Essential hypertension 12/27/2006  . SVT (supraventricular tachycardia) ([place]) 12/27/2006  . ALLERGIC RHINITIS 12/27/2006  . COPD 12/27/2006  . OSTEOARTHRITIS 12/27/2006  . Chronic back pain 12/27/2006   Past Medical History:  Diagnosis Date  . Chronic pain    Managed by Dr. Hardin Negus  . Diverticulitis   . Hyperlipidemia   . Hypertension   . IBS (irritable bowel syndrome)   . Palpitations   . Polymyalgia rheumatica ([place])   . PVC's (premature ventricular contractions)   . SVT (supraventricular tachycardia) ([place])   .  Tobacco dependence    Past Surgical History:  Procedure Laterality Date  . NASAL SINUS SURGERY    . REPLACEMENT TOTAL KNEE    . TONSILLECTOMY    . WRIST FRACTURE SURGERY     Social History  Substance Use Topics  . Smoking status: Former Smoker    Packs/day: 0.50    Quit date: 12/12/2011  . Smokeless tobacco: Never Used  . Alcohol use No   Family History  Problem Relation Age of Onset  . Diabetes Mother   . Heart attack Father   . Heart disease Sister    Allergies  Allergen Reactions  . Amoxicillin-Pot Clavulanate     REACTION: diarrhea  . Aspirin Nausea And Vomiting  . Atorvastatin     REACTION: feel weak  .  Carisoprodol     REACTION: intolerant  . Codeine     REACTION: hallucinations  . Codeine Phosphate     REACTION: UNKNOWN  . Cortisone     REACTION: pt. reports allergy, reaction not known  . Duoderm Hydroactive     Skin irritation and pain     . Ezetimibe-Simvastatin     REACTION: feels weak  . Naproxen Sodium     REACTION: GI symptoms  . Omeprazole     REACTION: abd pain  . Quinapril Hcl     REACTION: reports allergy, reaction not known  . Rofecoxib     REACTION: reports allergy, reaction not known  . Silvadene [Silver Sulfadiazine]     Skin irritation and pain    Current Outpatient Prescriptions on File Prior to Visit  Medication Sig Dispense Refill  . levothyroxine (SYNTHROID, LEVOTHROID) 100 MCG tablet Take 1 tablet (100 mcg total) by mouth daily. 30 tablet 11  . levothyroxine (SYNTHROID, LEVOTHROID) 112 MCG tablet Take 112 mcg by mouth daily before breakfast.    . meclizine (ANTIVERT) 25 MG tablet TAKE 1 TABLET (25 MG TOTAL) BY MOUTH 3 (THREE) TIMES DAILY AS NEEDED FOR DIZZINESS. 30 tablet 2  . metoprolol tartrate (LOPRESSOR) 25 MG tablet TAKE 1 TABLET (25 MG TOTAL) BY MOUTH 2 (TWO) TIMES DAILY. 180 tablet 0  . morphine (MS CONTIN) 15 MG 12 hr tablet Take 1 tablet by mouth daily.    [name] morphine (MS CONTIN) 30 MG 12 hr tablet Take 1 tablet by mouth daily.    . mupirocin ointment (BACTROBAN) 2 % Apply 1 application topically 3 (three) times daily. Apply to affected area (sore on your foot) three times daily 15 g 0  . pantoprazole (PROTONIX) 40 MG tablet TAKE 1 TABLET (40 MG TOTAL) BY MOUTH DAILY. 90 tablet 3  . predniSONE (DELTASONE) 5 MG tablet TAKE 1 TABLET BY MOUTH EVERY DAY WITH 1 MG TABLET TOTAL 8MG   0  . sertraline (ZOLOFT) 50 MG tablet TAKE 1 TABLET BY MOUTH EVERY DAY 90 tablet 3  . spironolactone (ALDACTONE) 25 MG tablet TAKE 1 TABLET (25 MG TOTAL) BY MOUTH DAILY. 90 tablet 1  . Wheat Dextrin (BENEFIBER PO) Take by mouth 2 (two) times daily.     No current  facility-administered medications on file prior to visit.     Review of Systems    Review of Systems  Constitutional: Negative for fever, appetite change, fatigue and unexpected weight change.  Eyes: Negative for pain and visual disturbance.  Respiratory: Negative for cough and shortness of breath.   Cardiovascular: Negative for cp or palpitations    Gastrointestinal: Negative for nausea, diarrhea and constipation.  Genitourinary: pos for urinary urgency  and incontinence ,neg for dysuria or hematuria  Skin: Negative for pallor or rash  pos for skin ulcers  Neurological: Negative for weakness, light-headedness, numbness and headaches.  Hematological: Negative for adenopathy. Does not bruise/bleed easily.  Psychiatric/Behavioral: Negative for dysphoric mood. The patient is not nervous/anxious.      Objective:   Physical Exam  Constitutional: She appears well-developed and well-nourished. No distress.  overwt and well appearing   Eyes: Conjunctivae and EOM are normal. Pupils are equal, round, and reactive to light. No scleral icterus.  Neck: Normal range of motion. Neck supple. No thyromegaly present.  Cardiovascular: Normal rate, regular rhythm and normal heart sounds.   Pulmonary/Chest: Effort normal and breath sounds normal. No respiratory distress. She has no wheezes. She has no rales.  Diffusely distant bs   Abdominal: Soft. Bowel sounds are normal. She exhibits no distension and no mass. There is no tenderness. There is no rebound and no guarding.  No suprapubic tenderness or fullness    Lymphadenopathy:    She has no cervical adenopathy.  Neurological: She is alert.  Skin: Skin is warm and dry. No rash noted. No erythema.  2 connecting skin ulcers on L inner thigh/close to buttock fold 3-4 cm and 2-3 cm  Some healing granulation tissue noted in the center  Little to no erythema and no drainage  Mildly tender    Psychiatric: She has a normal mood and affect.            Assessment & Plan:   Problem List Items Addressed This Visit      Endocrine   Hypothyroidism    Hypothyroidism  Pt has no clinical changes since dose was changed  Will do her TSH early since she is here today       Relevant Orders   TSH (Completed)     Other   Open wound of left thigh    Ongoing and not improved with silvadene (and pt states it burns) Rev derm notes from 2 different dates with pt in detail today Intolerant of duo derm  She has upcoming appt with wound care center  She pref to use the honey makula product that the wound care center recommended before  Will continue to wash with soap and water Avoid pads that rub the inner thigh area  wil try to control incontinence better with oxybutinin -but doubt we will be able to control it completely      Urinary incontinence - Primary    Pt wants to try an oral medication for urge incontinence  Disc poss side eff incl dry mouth/constipation/ sedation/dizzinesss and she will try with caution  Px oxybutinin 5 mg bid   Will update with response   She did not do well with incontinence undergarments due to elastic portion in crease of thigh Currently using a folded soft surgical gauze tucked into underwear to minimalize friction       Relevant Medications   oxybutynin (DITROPAN) 5 MG tablet    Other Visit Diagnoses   None.

## 2016-06-13 NOTE — Assessment & Plan Note (Addendum)
Ongoing and not improved with silvadene (and pt states it burns) Rev derm notes from 2 different dates with pt in detail today Intolerant of duo derm  She has upcoming appt with wound care center  She pref to use the honey makula product that the wound care center recommended before  Will continue to wash with soap and water Avoid pads that rub the inner thigh area  wil try to control incontinence better with oxybutinin -but doubt we will be able to control it completely

## 2016-06-13 NOTE — Assessment & Plan Note (Signed)
Hypothyroidism  Pt has no clinical changes since dose was changed  Will do her TSH early since she is here today

## 2016-06-14 ENCOUNTER — Other Ambulatory Visit: Payer: Self-pay | Admitting: Rheumatology

## 2016-06-14 ENCOUNTER — Other Ambulatory Visit: Payer: Self-pay | Admitting: Family Medicine

## 2016-06-14 ENCOUNTER — Telehealth: Payer: Self-pay | Admitting: *Deleted

## 2016-06-14 NOTE — Telephone Encounter (Signed)
Left voicemail requesting pt to call the office back 

## 2016-06-14 NOTE — Telephone Encounter (Signed)
-----   Message from Abner Greenspan, MD sent at 06/14/2016  4:18 PM EDT ----- [name]  Please decrease dose of levothyroxine to 50 mcg 1 po QD #30 3 ref   (she is currently on 100 mcg)  Please call in and change on med list  Re check tsh in 1 mo please

## 2016-06-15 NOTE — Telephone Encounter (Signed)
Left message for patient to call me back so I can see how she is doing with her taper.

## 2016-06-16 ENCOUNTER — Other Ambulatory Visit: Payer: 59

## 2016-06-16 ENCOUNTER — Other Ambulatory Visit: Payer: Self-pay | Admitting: Rheumatology

## 2016-06-16 NOTE — Telephone Encounter (Signed)
Have advised pharmacy I need to discuss prednisone with patient/ I have left message for her to call me back

## 2016-06-16 NOTE — Telephone Encounter (Signed)
[name] at [facility] called about the Prednisone refill. She has questions about it.

## 2016-06-17 ENCOUNTER — Inpatient Hospital Stay (HOSPITAL_COMMUNITY)
Admission: EM | Admit: 2016-06-17 | Discharge: 2016-06-24 | DRG: 872 | Disposition: A | Discharge status: Home / Self Care | Payer: 59 | Attending: Internal Medicine | Admitting: Internal Medicine

## 2016-06-17 ENCOUNTER — Inpatient Hospital Stay: Admission: RE | Admit: 2016-06-17 | Payer: 59 | Source: Ambulatory Visit

## 2016-06-17 ENCOUNTER — Inpatient Hospital Stay (HOSPITAL_COMMUNITY): Payer: 59

## 2016-06-17 ENCOUNTER — Emergency Department (HOSPITAL_COMMUNITY): Payer: 59

## 2016-06-17 ENCOUNTER — Other Ambulatory Visit: Payer: Self-pay | Admitting: Oncology

## 2016-06-17 DIAGNOSIS — A419 Sepsis, unspecified organism: Principal | ICD-10-CM | POA: Diagnosis present

## 2016-06-17 DIAGNOSIS — E0590 Thyrotoxicosis, unspecified without thyrotoxic crisis or storm: Secondary | ICD-10-CM | POA: Diagnosis not present

## 2016-06-17 DIAGNOSIS — Z8249 Family history of ischemic heart disease and other diseases of the circulatory system: Secondary | ICD-10-CM

## 2016-06-17 DIAGNOSIS — M549 Dorsalgia, unspecified: Secondary | ICD-10-CM | POA: Diagnosis present

## 2016-06-17 DIAGNOSIS — Z833 Family history of diabetes mellitus: Secondary | ICD-10-CM | POA: Diagnosis not present

## 2016-06-17 DIAGNOSIS — R634 Abnormal weight loss: Secondary | ICD-10-CM | POA: Diagnosis present

## 2016-06-17 DIAGNOSIS — B961 Klebsiella pneumoniae [K. pneumoniae] as the cause of diseases classified elsewhere: Secondary | ICD-10-CM | POA: Diagnosis present

## 2016-06-17 DIAGNOSIS — N390 Urinary tract infection, site not specified: Secondary | ICD-10-CM | POA: Diagnosis present

## 2016-06-17 DIAGNOSIS — S71109A Unspecified open wound, unspecified thigh, initial encounter: Secondary | ICD-10-CM

## 2016-06-17 DIAGNOSIS — Z7952 Long term (current) use of systemic steroids: Secondary | ICD-10-CM

## 2016-06-17 DIAGNOSIS — E039 Hypothyroidism, unspecified: Secondary | ICD-10-CM | POA: Diagnosis present

## 2016-06-17 DIAGNOSIS — Z888 Allergy status to other drugs, medicaments and biological substances status: Secondary | ICD-10-CM | POA: Diagnosis not present

## 2016-06-17 DIAGNOSIS — N3000 Acute cystitis without hematuria: Secondary | ICD-10-CM | POA: Diagnosis not present

## 2016-06-17 DIAGNOSIS — F32A Depression, unspecified: Secondary | ICD-10-CM | POA: Diagnosis present

## 2016-06-17 DIAGNOSIS — I4891 Unspecified atrial fibrillation: Secondary | ICD-10-CM | POA: Diagnosis present

## 2016-06-17 DIAGNOSIS — Z6835 Body mass index (BMI) 35.0-35.9, adult: Secondary | ICD-10-CM

## 2016-06-17 DIAGNOSIS — J438 Other emphysema: Secondary | ICD-10-CM | POA: Diagnosis not present

## 2016-06-17 DIAGNOSIS — I998 Other disorder of circulatory system: Secondary | ICD-10-CM | POA: Diagnosis present

## 2016-06-17 DIAGNOSIS — Z7901 Long term (current) use of anticoagulants: Secondary | ICD-10-CM | POA: Diagnosis not present

## 2016-06-17 DIAGNOSIS — Z87891 Personal history of nicotine dependence: Secondary | ICD-10-CM | POA: Diagnosis not present

## 2016-06-17 DIAGNOSIS — M353 Polymyalgia rheumatica: Secondary | ICD-10-CM | POA: Diagnosis present

## 2016-06-17 DIAGNOSIS — E876 Hypokalemia: Secondary | ICD-10-CM | POA: Diagnosis not present

## 2016-06-17 DIAGNOSIS — S71102D Unspecified open wound, left thigh, subsequent encounter: Secondary | ICD-10-CM | POA: Diagnosis not present

## 2016-06-17 DIAGNOSIS — I4892 Unspecified atrial flutter: Secondary | ICD-10-CM | POA: Diagnosis present

## 2016-06-17 DIAGNOSIS — I480 Paroxysmal atrial fibrillation: Secondary | ICD-10-CM | POA: Diagnosis not present

## 2016-06-17 DIAGNOSIS — R32 Unspecified urinary incontinence: Secondary | ICD-10-CM | POA: Diagnosis present

## 2016-06-17 DIAGNOSIS — D472 Monoclonal gammopathy: Secondary | ICD-10-CM | POA: Diagnosis present

## 2016-06-17 DIAGNOSIS — E860 Dehydration: Secondary | ICD-10-CM | POA: Diagnosis present

## 2016-06-17 DIAGNOSIS — F329 Major depressive disorder, single episode, unspecified: Secondary | ICD-10-CM | POA: Diagnosis present

## 2016-06-17 DIAGNOSIS — G894 Chronic pain syndrome: Secondary | ICD-10-CM | POA: Diagnosis present

## 2016-06-17 DIAGNOSIS — Z79891 Long term (current) use of opiate analgesic: Secondary | ICD-10-CM

## 2016-06-17 DIAGNOSIS — R0789 Other chest pain: Secondary | ICD-10-CM | POA: Diagnosis not present

## 2016-06-17 DIAGNOSIS — M6281 Muscle weakness (generalized): Secondary | ICD-10-CM

## 2016-06-17 DIAGNOSIS — A498 Other bacterial infections of unspecified site: Secondary | ICD-10-CM | POA: Diagnosis not present

## 2016-06-17 DIAGNOSIS — Z79899 Other long term (current) drug therapy: Secondary | ICD-10-CM | POA: Diagnosis not present

## 2016-06-17 DIAGNOSIS — R079 Chest pain, unspecified: Secondary | ICD-10-CM | POA: Diagnosis present

## 2016-06-17 DIAGNOSIS — J449 Chronic obstructive pulmonary disease, unspecified: Secondary | ICD-10-CM | POA: Diagnosis present

## 2016-06-17 DIAGNOSIS — I10 Essential (primary) hypertension: Secondary | ICD-10-CM | POA: Diagnosis present

## 2016-06-17 DIAGNOSIS — E785 Hyperlipidemia, unspecified: Secondary | ICD-10-CM | POA: Diagnosis present

## 2016-06-17 DIAGNOSIS — R55 Syncope and collapse: Secondary | ICD-10-CM | POA: Diagnosis present

## 2016-06-17 DIAGNOSIS — D649 Anemia, unspecified: Secondary | ICD-10-CM | POA: Diagnosis not present

## 2016-06-17 DIAGNOSIS — E0580 Other thyrotoxicosis without thyrotoxic crisis or storm: Secondary | ICD-10-CM | POA: Diagnosis present

## 2016-06-17 DIAGNOSIS — D638 Anemia in other chronic diseases classified elsewhere: Secondary | ICD-10-CM | POA: Diagnosis present

## 2016-06-17 DIAGNOSIS — B9620 Unspecified Escherichia coli [E. coli] as the cause of diseases classified elsewhere: Secondary | ICD-10-CM | POA: Diagnosis present

## 2016-06-17 LAB — CBC WITH DIFFERENTIAL/PLATELET
Basophils Absolute: 0 10*3/uL (ref 0.0–0.1)
Basophils Relative: 0 %
Eosinophils Absolute: 0 10*3/uL (ref 0.0–0.7)
Eosinophils Relative: 0 %
HEMATOCRIT: 28.7 % — AB (ref 36.0–46.0)
HEMOGLOBIN: 8.9 g/dL — AB (ref 12.0–15.0)
LYMPHS ABS: 1.3 10*3/uL (ref 0.7–4.0)
Lymphocytes Relative: 12 %
MCH: 27.6 pg (ref 26.0–34.0)
MCHC: 31 g/dL (ref 30.0–36.0)
MCV: 89.1 fL (ref 78.0–100.0)
MONO ABS: 1 10*3/uL (ref 0.1–1.0)
MONOS PCT: 10 %
NEUTROS ABS: 8.3 10*3/uL — AB (ref 1.7–7.7)
NEUTROS PCT: 78 %
Platelets: 170 10*3/uL (ref 150–400)
RBC: 3.22 MIL/uL — ABNORMAL LOW (ref 3.87–5.11)
RDW: 15.1 % (ref 11.5–15.5)
WBC: 10.6 10*3/uL — ABNORMAL HIGH (ref 4.0–10.5)

## 2016-06-17 LAB — URINALYSIS, ROUTINE W REFLEX MICROSCOPIC
Glucose, UA: NEGATIVE mg/dL
KETONES UR: 40 mg/dL — AB
Nitrite: POSITIVE — AB
PROTEIN: 100 mg/dL — AB
Specific Gravity, Urine: 1.027 (ref 1.005–1.030)
pH: 6 (ref 5.0–8.0)

## 2016-06-17 LAB — URINE MICROSCOPIC-ADD ON

## 2016-06-17 LAB — HEPATIC FUNCTION PANEL
ALT: 9 U/L — ABNORMAL LOW (ref 14–54)
AST: 22 U/L (ref 15–41)
Albumin: 2.9 g/dL — ABNORMAL LOW (ref 3.5–5.0)
Alkaline Phosphatase: 60 U/L (ref 38–126)
BILIRUBIN INDIRECT: 0.6 mg/dL (ref 0.3–0.9)
Bilirubin, Direct: 0.3 mg/dL (ref 0.1–0.5)
TOTAL PROTEIN: 6.9 g/dL (ref 6.5–8.1)
Total Bilirubin: 0.9 mg/dL (ref 0.3–1.2)

## 2016-06-17 LAB — I-STAT TROPONIN, ED: TROPONIN I, POC: 0.05 ng/mL (ref 0.00–0.08)

## 2016-06-17 LAB — BASIC METABOLIC PANEL
Anion gap: 10 (ref 5–15)
BUN: 12 mg/dL (ref 6–20)
CALCIUM: 9 mg/dL (ref 8.9–10.3)
CO2: 26 mmol/L (ref 22–32)
CREATININE: 0.84 mg/dL (ref 0.44–1.00)
Chloride: 100 mmol/L — ABNORMAL LOW (ref 101–111)
GFR calc Af Amer: >60 mL/min (ref >60)
GLUCOSE: 141 mg/dL — AB (ref 65–99)
Potassium: 3.5 mmol/L (ref 3.5–5.1)
Sodium: 136 mmol/L (ref 135–145)

## 2016-06-17 LAB — PHOSPHORUS: Phosphorus: 2.9 mg/dL (ref 2.5–4.6)

## 2016-06-17 LAB — MAGNESIUM: MAGNESIUM: 1.5 mg/dL — AB (ref 1.7–2.4)

## 2016-06-17 LAB — T4, FREE: Free T4: 1.82 ng/dL — ABNORMAL HIGH (ref 0.61–1.12)

## 2016-06-17 LAB — TROPONIN I
TROPONIN I: 0.04 ng/mL — AB (ref <0.03)
TROPONIN I: 0.04 ng/mL — AB (ref <0.03)
Troponin I: 0.07 ng/mL (ref <0.03)

## 2016-06-17 LAB — TSH: TSH: 0.019 u[IU]/mL — ABNORMAL LOW (ref 0.350–4.500)

## 2016-06-17 LAB — I-STAT CG4 LACTIC ACID, ED
LACTIC ACID, VENOUS: 1.69 mmol/L (ref 0.5–1.9)
Lactic Acid, Venous: 0.76 mmol/L (ref 0.5–1.9)

## 2016-06-17 LAB — MRSA PCR SCREENING: MRSA by PCR: NEGATIVE

## 2016-06-17 MED ORDER — MORPHINE SULFATE ER 15 MG PO TBCR
30.0000 mg | EXTENDED_RELEASE_TABLET | Freq: Every day | ORAL | Status: DC
  Administered 2016-06-17 – 2016-06-23 (×7): 30 mg via ORAL
  Filled 2016-06-17 (×7): qty 2

## 2016-06-17 MED ORDER — MAGNESIUM SULFATE 2 GM/50ML IV SOLN
2.0000 g | Freq: Once | INTRAVENOUS | Status: AC
Start: 2016-06-17 — End: 2016-06-17
  Administered 2016-06-17: 2 g via INTRAVENOUS
  Filled 2016-06-17: qty 50

## 2016-06-17 MED ORDER — SODIUM CHLORIDE 0.9 % IV BOLUS (SEPSIS)
500.0000 mL | Freq: Once | INTRAVENOUS | Status: AC
  Administered 2016-06-17: 500 mL via INTRAVENOUS

## 2016-06-17 MED ORDER — HYDROCODONE-ACETAMINOPHEN 5-325 MG PO TABS
1.0000 | ORAL_TABLET | Freq: Once | ORAL | Status: AC
  Administered 2016-06-17: 1 via ORAL
  Filled 2016-06-17: qty 1

## 2016-06-17 MED ORDER — LEVOTHYROXINE SODIUM 100 MCG PO TABS
100.0000 ug | ORAL_TABLET | Freq: Every day | ORAL | Status: DC
  Administered 2016-06-17: 100 ug via ORAL
  Filled 2016-06-17: qty 1

## 2016-06-17 MED ORDER — ENOXAPARIN SODIUM 100 MG/ML ~~LOC~~ SOLN
1.0000 mg/kg | Freq: Two times a day (BID) | SUBCUTANEOUS | Status: DC
  Administered 2016-06-17 – 2016-06-22 (×11): 100 mg via SUBCUTANEOUS
  Filled 2016-06-17 (×12): qty 1

## 2016-06-17 MED ORDER — SODIUM CHLORIDE 0.9 % IV BOLUS (SEPSIS)
1000.0000 mL | Freq: Once | INTRAVENOUS | Status: AC
  Administered 2016-06-17: 1000 mL via INTRAVENOUS

## 2016-06-17 MED ORDER — DILTIAZEM HCL 100 MG IV SOLR
5.0000 mg/h | INTRAVENOUS | Status: DC
  Administered 2016-06-17: 12.5 mg/h via INTRAVENOUS
  Administered 2016-06-17: 10 mg/h via INTRAVENOUS
  Administered 2016-06-17: 12.5 mg/h via INTRAVENOUS
  Filled 2016-06-17 (×2): qty 100

## 2016-06-17 MED ORDER — DEXTROSE 5 % IV SOLN
2.0000 g | Freq: Once | INTRAVENOUS | Status: AC
  Administered 2016-06-17: 2 g via INTRAVENOUS
  Filled 2016-06-17: qty 2

## 2016-06-17 MED ORDER — ONDANSETRON HCL 4 MG/2ML IJ SOLN: 4.0000 mg | Freq: Four times a day (QID) | INTRAMUSCULAR | Status: DC | PRN

## 2016-06-17 MED ORDER — ENSURE ENLIVE PO LIQD
237.0000 mL | Freq: Two times a day (BID) | ORAL | Status: DC
  Administered 2016-06-18 – 2016-06-24 (×10): 237 mL via ORAL

## 2016-06-17 MED ORDER — PANTOPRAZOLE SODIUM 40 MG PO TBEC
40.0000 mg | DELAYED_RELEASE_TABLET | Freq: Every day | ORAL | Status: DC
  Administered 2016-06-17 – 2016-06-24 (×8): 40 mg via ORAL
  Filled 2016-06-17 (×8): qty 1

## 2016-06-17 MED ORDER — PREDNISONE 5 MG PO TABS
2.5000 mg | ORAL_TABLET | Freq: Every day | ORAL | Status: DC
  Administered 2016-06-17 – 2016-06-24 (×8): 2.5 mg via ORAL
  Filled 2016-06-17 (×10): qty 1

## 2016-06-17 MED ORDER — MORPHINE SULFATE ER 15 MG PO TBCR
15.0000 mg | EXTENDED_RELEASE_TABLET | Freq: Every day | ORAL | Status: DC
  Administered 2016-06-17 – 2016-06-24 (×8): 15 mg via ORAL
  Filled 2016-06-17 (×9): qty 1

## 2016-06-17 MED ORDER — OXYCODONE HCL 5 MG PO TABS
5.0000 mg | ORAL_TABLET | Freq: Four times a day (QID) | ORAL | Status: DC | PRN
  Administered 2016-06-17 – 2016-06-18 (×2): 5 mg via ORAL
  Filled 2016-06-17 (×2): qty 1

## 2016-06-17 MED ORDER — DEXTROSE 5 % IV SOLN
1.0000 g | Freq: Three times a day (TID) | INTRAVENOUS | Status: DC
  Filled 2016-06-17: qty 1

## 2016-06-17 MED ORDER — LEVOFLOXACIN IN D5W 750 MG/150ML IV SOLN
750.0000 mg | Freq: Once | INTRAVENOUS | Status: AC
  Administered 2016-06-17: 750 mg via INTRAVENOUS
  Filled 2016-06-17: qty 150

## 2016-06-17 MED ORDER — LEVOFLOXACIN IN D5W 750 MG/150ML IV SOLN
750.0000 mg | INTRAVENOUS | Status: DC
  Administered 2016-06-18: 750 mg via INTRAVENOUS
  Filled 2016-06-17: qty 150

## 2016-06-17 MED ORDER — SERTRALINE HCL 50 MG PO TABS
50.0000 mg | ORAL_TABLET | Freq: Every day | ORAL | Status: DC
  Administered 2016-06-17 – 2016-06-24 (×8): 50 mg via ORAL
  Filled 2016-06-17 (×8): qty 1

## 2016-06-17 MED ORDER — COLLAGENASE 250 UNIT/GM EX OINT
TOPICAL_OINTMENT | Freq: Every day | CUTANEOUS | Status: DC
  Administered 2016-06-17 – 2016-06-24 (×8): via TOPICAL
  Filled 2016-06-17 (×2): qty 30

## 2016-06-17 MED ORDER — ACETAMINOPHEN 325 MG PO TABS
650.0000 mg | ORAL_TABLET | Freq: Once | ORAL | Status: AC
  Administered 2016-06-17: 650 mg via ORAL
  Filled 2016-06-17: qty 2

## 2016-06-17 MED ORDER — DEXTROSE 5 % IV SOLN
5.0000 mg/h | Freq: Once | INTRAVENOUS | Status: AC
  Administered 2016-06-17: 5 mg/h via INTRAVENOUS
  Filled 2016-06-17: qty 100

## 2016-06-17 MED ORDER — VANCOMYCIN HCL IN DEXTROSE 1-5 GM/200ML-% IV SOLN: 1000.0000 mg | Freq: Two times a day (BID) | INTRAVENOUS | Status: DC

## 2016-06-17 MED ORDER — ENOXAPARIN SODIUM 100 MG/ML ~~LOC~~ SOLN: 1.0000 mg/kg | Freq: Two times a day (BID) | SUBCUTANEOUS | Status: DC

## 2016-06-17 MED ORDER — ACETAMINOPHEN 325 MG PO TABS
650.0000 mg | ORAL_TABLET | ORAL | Status: DC | PRN
  Administered 2016-06-18 – 2016-06-23 (×7): 650 mg via ORAL
  Filled 2016-06-17 (×8): qty 2

## 2016-06-17 MED ORDER — VANCOMYCIN HCL IN DEXTROSE 1-5 GM/200ML-% IV SOLN
1000.0000 mg | Freq: Once | INTRAVENOUS | Status: AC
  Administered 2016-06-17: 1000 mg via INTRAVENOUS
  Filled 2016-06-17: qty 200

## 2016-06-17 NOTE — ED Notes (Signed)
Pt was scheduled to have a MR Abdomen wwo contrast and liver wwo contrast today at [facility].  Notified them that pt would be having those scans while admitted but she would need the DG Bone Density scan to be rescheduled.  Additionally for them to notify the ordering providers of the cancellation and why per Lissa Merlin, NP.

## 2016-06-17 NOTE — ED Provider Notes (Signed)
[facility] DEPT Provider Note   CSN: [id number] Arrival date & time: 06/17/16  0247  By signing my name below, I, Gwenlyn Fudge, attest that this documentation has been prepared under the direction and in the presence of Ripley Fraise, MD. Electronically Signed: Gwenlyn Fudge, ED Scribe. 06/17/16. 3:14 AM.   History   Chief Complaint Chief Complaint  Patient presents with  . Chest Pain   LEVEL 5 CAVEAT: HPI and ROS limited due to Altered Mental Status  Pt has PMHx of HLD, HTN, PVC's, and SVT. Pt reports gradual onset, constant chest pain onset 11:30 PM tonight. She states the pain is "nagging". Per triage note, pt has mild relief of chest pain with nitroglycerin. Pt also reports skin sores to her buttocks and to her left great toe. She has a fever on arrival to ED. Pt reports a recent urine infection a few days PTA. Pt denies vomiting, cough, diarrhea, dizziness, abdominal pain, headache. Pt is scheduled for an MRI tomorrow of gallbladder for possible gallbladder infection.   The history is provided by the patient. No language interpreter was used.  Chest Pain   This is a new problem. The current episode started 3 to 5 hours ago. The problem occurs constantly. The pain is present in the substernal region. The pain is moderate. The pain does not radiate. Associated symptoms include a fever. Pertinent negatives include no abdominal pain, no cough, no dizziness, no headaches, no nausea, no shortness of breath and no vomiting. She has tried nitroglycerin for the symptoms. The treatment provided moderate relief. Risk factors include being elderly and obesity.  Her past medical history is significant for hyperlipidemia and hypertension.    Past Medical History:  Diagnosis Date  . Chronic pain    Managed by Dr. Hardin Negus  . Diverticulitis   . Hyperlipidemia   . Hypertension   . IBS (irritable bowel syndrome)   . Palpitations   . Polymyalgia rheumatica ([place])   . PVC's (premature  ventricular contractions)   . SVT (supraventricular tachycardia) ([place])   . Tobacco dependence     Patient Active Problem List   Diagnosis Date Noted  . Lymphedema of arm 04/02/2016  . Open wound of left thigh 04/02/2016  . Urinary incontinence 04/02/2016  . Wheeze 04/02/2016  . Foot ulcer ([place]) 01/13/2016  . Cellulitis of leg, left 01/05/2016  . Pedal edema 11/27/2015  . MGUS (monoclonal gammopathy of unknown significance) 11/18/2015  . Liver lesion 11/18/2015  . Left foot pain 10/10/2015  . Leg edema, left 09/22/2015  . Acute joint pain 10/14/2014  . Right foot pain 08/05/2014  . Right shoulder pain 08/05/2014  . Microscopic hematuria 05/21/2014  . PMR (polymyalgia rheumatica) ([place]) 05/15/2014  . Elevated sed rate 04/30/2014  . UTI (urinary tract infection) 04/30/2014  . Other malaise and fatigue 04/17/2014  . Diverticulosis 04/17/2014  . Bilateral arm pain 04/17/2014  . Abdominal pain, LLQ 04/17/2014  . Former smoker 12/12/2012  . Muscle cramps 07/12/2012  . Depression 04/19/2011  . Pre-syncope 03/08/2011  . OBESITY 10/03/2007  . VERTIGO 04/11/2007  . Hypothyroidism 12/27/2006  . HYPERCHOLESTEROLEMIA 12/27/2006  . Essential hypertension 12/27/2006  . SVT (supraventricular tachycardia) ([place]) 12/27/2006  . ALLERGIC RHINITIS 12/27/2006  . COPD 12/27/2006  . OSTEOARTHRITIS 12/27/2006  . Chronic back pain 12/27/2006    Past Surgical History:  Procedure Laterality Date  . NASAL SINUS SURGERY    . REPLACEMENT TOTAL KNEE    . TONSILLECTOMY    . WRIST FRACTURE SURGERY  OB History    No data available      Home Medications    Prior to Admission medications   Medication Sig Start Date End Date Taking? Authorizing Provider  levothyroxine (SYNTHROID, LEVOTHROID) 100 MCG tablet Take 1 tablet (100 mcg total) by mouth daily. 05/28/16   Abner Greenspan, MD  levothyroxine (SYNTHROID, LEVOTHROID) 112 MCG tablet Take 112 mcg by mouth daily before breakfast.    Historical  Provider, MD  meclizine (ANTIVERT) 25 MG tablet TAKE 1 TABLET (25 MG TOTAL) BY MOUTH 3 (THREE) TIMES DAILY AS NEEDED FOR DIZZINESS. 03/14/15   Abner Greenspan, MD  metoprolol tartrate (LOPRESSOR) 25 MG tablet TAKE 1 TABLET (25 MG TOTAL) BY MOUTH 2 (TWO) TIMES DAILY. 06/14/16   Abner Greenspan, MD  morphine (MS CONTIN) 15 MG 12 hr tablet Take 1 tablet by mouth daily. 01/02/16   Historical Provider, MD  morphine (MS CONTIN) 30 MG 12 hr tablet Take 1 tablet by mouth daily. 01/02/16   Historical Provider, MD  mupirocin ointment (BACTROBAN) 2 % Apply 1 application topically 3 (three) times daily. Apply to affected area (sore on your foot) three times daily 01/09/16   Abner Greenspan, MD  oxybutynin (DITROPAN) 5 MG tablet Take 1 tablet (5 mg total) by mouth 2 (two) times daily. 06/11/16   Wynelle Fanny Tower, MD  pantoprazole (PROTONIX) 40 MG tablet TAKE 1 TABLET (40 MG TOTAL) BY MOUTH DAILY. 08/26/15   Wynelle Fanny Tower, MD  predniSONE (DELTASONE) 5 MG tablet TAKE 1 TABLET BY MOUTH EVERY DAY WITH 1 MG TABLET TOTAL 8MG  08/06/15   Historical Provider, MD  sertraline (ZOLOFT) 50 MG tablet TAKE 1 TABLET BY MOUTH EVERY DAY 03/08/16   Abner Greenspan, MD  spironolactone (ALDACTONE) 25 MG tablet TAKE 1 TABLET (25 MG TOTAL) BY MOUTH DAILY. 05/03/16   Abner Greenspan, MD  Wheat Dextrin (BENEFIBER PO) Take by mouth 2 (two) times daily.    Historical Provider, MD   Family History Family History  Problem Relation Age of Onset  . Diabetes Mother   . Heart attack Father   . Heart disease Sister     Social History Social History  Substance Use Topics  . Smoking status: Former Smoker    Packs/day: 0.50    Quit date: 12/12/2011  . Smokeless tobacco: Never Used  . Alcohol use No   Allergies   Amoxicillin-pot clavulanate; Aspirin; Atorvastatin; Carisoprodol; Codeine; Codeine phosphate; Cortisone; Duoderm hydroactive; Ezetimibe-simvastatin; Naproxen sodium; Omeprazole; Quinapril hcl; Rofecoxib; and Silvadene [silver  sulfadiazine]   Review of Systems Review of Systems  Unable to perform ROS: Mental status change  Constitutional: Positive for fever.  Respiratory: Negative for cough and shortness of breath.   Cardiovascular: Positive for chest pain.  Gastrointestinal: Negative for abdominal pain, diarrhea, nausea and vomiting.  Neurological: Negative for dizziness and headaches.   Physical Exam Updated Vital Signs BP 113/70 (BP Location: Right Wrist)   Pulse 65   Temp 98.9 F (37.2 C) (Oral)   Resp 18   Ht 5\' 7"  (1.702 m)   Wt 223 lb (101.2 kg)   SpO2 94%   BMI 34.93 kg/m  Rectal temp = 101 Physical Exam  Nursing note and vitals reviewed. CONSTITUTIONAL: Elderly, disheveled HEAD: Normocephalic/atraumatic EYES: EOMI/PERRL ENMT: Mucous membranes dry NECK: supple no meningeal signs SPINE/BACK:entire spine nontender CV: tachycardic, irregular LUNGS: coarse BS noted bilaterally, no distress noted ABDOMEN: soft, nontender, no rebound or guarding, bowel sounds noted throughout abdomen  GU:no cva tenderness  NEURO: Pt is awake/alert/confused moves all extremitiesx4. EXTREMITIES: pulses normal/equal, full ROM, no wounds or deformity to toes SKIN: warm, color normal, skin breakdown noted to left medial thigh without drainage/bleeding or abscess.  No signs of cellulitis PSYCH: anxious    ED Treatments / Results  DIAGNOSTIC STUDIES: Oxygen Saturation is 94% on RA, adequate by my interpretation.    COORDINATION OF CARE: 3:11 AM Discussed treatment plan with pt at bedside which includes admission and pt agreed to plan.  Labs (all labs ordered are listed, but only abnormal results are displayed) Labs Reviewed  BASIC METABOLIC PANEL - Abnormal; Notable for the following:       Result Value   Chloride 100 (*)    Glucose, Bld 141 (*)    All other components within normal limits  URINALYSIS, ROUTINE W REFLEX MICROSCOPIC (NOT AT [facility]) - Abnormal; Notable for the following:    Color, Urine AMBER  (*)    APPearance HAZY (*)    Hgb urine dipstick MODERATE (*)    Bilirubin Urine SMALL (*)    Ketones, ur 40 (*)    Protein, ur 100 (*)    Nitrite POSITIVE (*)    Leukocytes, UA MODERATE (*)    All other components within normal limits  HEPATIC FUNCTION PANEL - Abnormal; Notable for the following:    Albumin 2.9 (*)    ALT 9 (*)    All other components within normal limits  URINE MICROSCOPIC-ADD ON - Abnormal; Notable for the following:    Squamous Epithelial / LPF 0-5 (*)    Bacteria, UA MANY (*)    Casts HYALINE CASTS (*)    All other components within normal limits  CULTURE, BLOOD (ROUTINE X 2)  CULTURE, BLOOD (ROUTINE X 2)  URINE CULTURE  CBC WITH DIFFERENTIAL/PLATELET  I-STAT TROPOININ, ED  I-STAT CG4 LACTIC ACID, ED  I-STAT CG4 LACTIC ACID, ED    EKG  EKG Interpretation  Date/Time:  Thursday June 17 2016 02:57:54 EDT Ventricular Rate:  163 PR Interval:    QRS Duration: 81 QT Interval:  280 QTC Calculation: 462 R Axis:   40 Text Interpretation:  Atrial fibrillation with rapid V-rate Abnormal R-wave progression, early transition Repolarization abnormality, prob rate related Interpretation limited secondary to artifact Confirmed by Christy Gentles  MD, [place] ([id number]) on 06/17/2016 3:01:44 AM       Radiology Dg Chest Portable 1 View  Result Date: 06/17/2016 CLINICAL DATA:  Chest pain Code sepsis EXAM: PORTABLE CHEST 1 VIEW COMPARISON:  04/02/2016 FINDINGS: Chronic diffuse interstitial changes are again noted. Probable scarring at the left lung base. Low lung volumes. No gross consolidation. Cannot exclude tiny left effusion. Stable mild cardiomegaly with atherosclerosis. No pneumothorax. Degenerative changes of the right shoulder. IMPRESSION: 1. Stable mild cardiomegaly. 2. Diffuse interstitial changes, chronic. Cannot rule out tiny left effusion. 3. Atherosclerosis of the aorta Electronically Signed   By: Donavan Foil M.D.   On: 06/17/2016 03:37     Procedures Procedures  CRITICAL CARE Performed by: Sharyon Cable Total critical care time: 40 minutes Critical care time was exclusive of separately billable procedures and treating other patients. Critical care was necessary to treat or prevent imminent or life-threatening deterioration. Critical care was time spent personally by me on the following activities: development of treatment plan with patient and/or surrogate as well as nursing, discussions with consultants, evaluation of patient's response to treatment, examination of patient, obtaining history from patient or surrogate, ordering and performing treatments and interventions, ordering and review of  laboratory studies, ordering and review of radiographic studies, pulse oximetry and re-evaluation of patient's condition. PATIENT WITH SEPSIS, ATRIAL FIBRILLATION WITH RAPID RATE TAHT REQUIRED IV FLUIDS AND ALSO CARDIZEM Medications Ordered in ED Medications  sodium chloride 0.9 % bolus 1,000 mL (0 mLs Intravenous Stopped 06/17/16 0406)    And  sodium chloride 0.9 % bolus 1,000 mL (0 mLs Intravenous Stopped 06/17/16 0419)    And  sodium chloride 0.9 % bolus 1,000 mL (1,000 mLs Intravenous New Bag/Given 06/17/16 0529)    And  sodium chloride 0.9 % bolus 500 mL (500 mLs Intravenous New Bag/Given 06/17/16 0530)  HYDROcodone-acetaminophen (NORCO/VICODIN) 5-325 MG per tablet 1 tablet (not administered)  levofloxacin (LEVAQUIN) IVPB 750 mg (750 mg Intravenous New Bag/Given 06/17/16 0335)  aztreonam (AZACTAM) 2 g in dextrose 5 % 50 mL IVPB (0 g Intravenous Stopped 06/17/16 0530)  vancomycin (VANCOCIN) IVPB 1000 mg/200 mL premix (0 mg Intravenous Stopped 06/17/16 0500)  acetaminophen (TYLENOL) tablet 650 mg (650 mg Oral Given 06/17/16 0335)  diltiazem (CARDIZEM) 100 mg in dextrose 5 % 100 mL (1 mg/mL) infusion (5 mg/hr Intravenous New Bag/Given 06/17/16 0532)     Initial Impression / Assessment and Plan / ED Course  I have reviewed  the triage vital signs and the nursing notes.  Pertinent labs & imaging results that were available during my care of the patient were reviewed by me and considered in my medical decision making (see chart for details).  Clinical Course   4:24 AM History very difficult as patient and her husband are both confused Apparently, she called EMS for chest pain However she is noted to be febrile/tachycardic and in a-fib She has UTI Pt is likely septic Will need admit 5:51 AM Pt resting comfortably D/w dr Hal Hope for admission Pt still appears in afib even after IV fluids He requests to start cardizem Pt awake/alert, no distress Lactate negative No hypotension      This patients CHA2DS2-VASc Score and unadjusted Ischemic Stroke Rate (% per year) is equal to 2.2 % stroke rate/year from a score of 2  Above score calculated as 1 point each if present [CHF, HTN, DM, Vascular=MI/PAD/Aortic Plaque, Age if 65-74, or Female] Above score calculated as 2 points each if present [Age > 75, or Stroke/TIA/TE]     I personally performed the services described in this documentation, which was scribed in my presence. The recorded information has been reviewed and is accurate.     Final Clinical Impressions(s) / ED Diagnoses   Final diagnoses:  Sepsis, due to unspecified organism [facility])  Urinary tract infection without hematuria, site unspecified  Atrial fibrillation with rapid ventricular response [facility])    New Prescriptions New Prescriptions   No medications on file     Ripley Fraise, MD 06/17/16 [phone]

## 2016-06-17 NOTE — Care Management Note (Signed)
Case Management Note  Patient Details  Name: FAYMA LEVICK MRN: [id number] Date of Birth: June 01, 1936  Subjective/Objective:                  80 y.o. female with a Past Medical History significant for  MGUS, prior SVT, low thyroid who presents with chest discomfort. From home with spouse.  Action/Plan: Follow for disposition needs. /Admit to hospital, New AF- would like to start NOAC but pt has [employer] so not sure best co pay: Eliquis, Pradaxa or Xarelto.   Expected Discharge Date:  06/20/16               Expected Discharge Plan:  [place]  In-House Referral:  NA  Discharge planning Services  CM Consult, Medication Assistance  Post Acute Care Choice:    Choice offered to:     DME Arranged:    DME Agency:     HH Arranged:    HH Agency:     Status of Service:  In process, will continue to follow  If discussed at Long Length of Stay Meetings, dates discussed:    Additional Comments:  Fuller Mandril, RN 06/17/2016, 11:44 AM

## 2016-06-17 NOTE — Progress Notes (Signed)
COURTESY NOTE:  80 y/o followed by Dr Sullivan Lone with a diagnosis of MGUS, SPEP most recently (03/22/2016) showing no monoclonal spike but IFE showing a small monoclonal protein with IgA kappa specificity. She has no lytic lesions and as of today still has a normal total protein (6.9)   As you know MGUS is a benign process and is not related to the reasons for patient's admission. I will repeat an SPEP and other relevant labs and we will follow those results. Appreciate learning of this patient's admission

## 2016-06-17 NOTE — ED Notes (Signed)
Helped get patient cleaned up patient is resting

## 2016-06-17 NOTE — ED Triage Notes (Signed)
Pt comes in with chest pain that woke her up from sleep at 2330. Pt states pain is nagging. And was relieved some with nitro. Pt denies n/v, sob, dizziness. Pt received 2 nitro and 324 asa pta

## 2016-06-17 NOTE — H&P (Signed)
History and Physical    CAROLA LACROIX [id number] DOB: 1936/03/03 DOA: 06/17/2016   PCP: [name], MD   Patient coming from/Resides with: Private residence/husband  Admission status: Inpatient/stepdown -medically necessary to stay a minimum 2 midnights to rule out impending and/or unexpected changes in physiologic status that may differ from initial evaluation performed in the ER and/or at time of admission. Presented with chest pain in setting of new onset atrial fibrillation with RVR as well as UTI with SIRS with normal lactic acid and renal function, currently requiring IV Cardizem for rate control, still seems mildly volume depleted so may require additional IV fluids, initially will begin IV antibiotics UTI. Additionally was found to have new anemia and non-volitional weight loss of 20 pounds since August. Recent TSH was low with outpatient adjustment in Synthroid but this is concerning in setting of atrial fibrillation with tachycardia as well as anemia and non-volitional weight loss.  Chief Complaint: Chest pain  HPI: EMMALYNE OLIVERSON is a 80 y.o. female with medical history significant for history of prior SVT, chronic urinary incontinence with associated chronic wound involving the left medial thigh extending to the left gluteal fold, COPD and former tobacco use, hypertension, hypothyroidism, chronic back pain on MS Contin, depression, dyslipidemia, MGUS, and polymyalgia rheumatica chronic low-dose prednisone. Patient reports that around 7 PM last night she began having chest pain that was constant in nature with mild shortness of breath. She went to bed and slept for a few hours in which he got up to go to the bathroom she was still having the chest pain. At no time did she have any awareness of palpitations. Because of the chest pain she reported to the ER where she was found to be in atrial fibrillation with RVR with ventricular response of 150+ beats per minute. She has not had  any fevers or chills at home. She has no history of recurrent UTI. She is reporting urinary frequency and increased episodes of urinary incontinence past her baseline. In review of her outpatient records patient has lost nearly 20 pounds since August. She reports she has not made any attempts to lose weight. She is currently chest pain-free. In the ER patient was found to be febrile with a rectal temperature greater than 101 and given her abnormal urinalysis she was initially treated as a code sepsis.  ED Course:  Vital Signs: BP 107/79 (BP Location: Right Arm)   Pulse (!) 127   Temp 101.6 F (38.7 C) (Rectal)   Resp 13   Ht 5\' 7"  (1.702 m)   Wt 101.2 kg (223 lb)   SpO2 96%   BMI 34.93 kg/m  PCXR: Stable mild cardiomegaly, diffuse interstitial changes which are chronic Lab data: Sodium 136, potassium 3.5, chloride 100, BUN 12, creatinine 0.84, glucose 141, albumin 2.9, LFTs normal, poc [place] and 0.05, lactic acid normal Times 2 collections 1.69 and 0.76, WBC 10,600 neutrophils 78% and absolute neutrophils 8.3%, hemoglobin 8.9 with hematocrit 28.7, MCV 89.1, platelets 170,000, urinalysis abnormal and consistent with UTI, blood cultures and a urine culture has been obtained in the ER Medications and treatments: Normal saline bolus 3.5 L, Levaquin 750 mg IV 1, is active and 2 g IV 1, vancomycin 1 g IV 1, Tylenol 650 mg 1, Cardizem infusion at 5 mg per hour  Review of Systems:  In addition to the HPI above,  No reported Fever-chills, myalgias or other constitutional symptoms at home No Headache, changes with Vision or hearing, new weakness, tingling,  numbness in any extremity, dysarthria or word finding difficulty, gait disturbance or imbalance, tremors or seizure activity No problems swallowing food or Liquids, indigestion/reflux, choking or coughing while eating, abdominal pain with or after eating No Cough or palpitations, orthopnea or DOE No Abdominal pain, N/V, melena,hematochezia, dark  tarry stools, constipation No dysuria, malodorous urine, hematuria or flank pain-has chronic urinary incontinence and increased urinary frequency No new skin  lesions, masses or bruises, No new joint pains, aches, swelling or redness No recent unintentional weight gain  No polyuria, polydypsia or polyphagia   Past Medical History:  Diagnosis Date  . Chronic pain    Managed by Dr. Hardin Negus  . Diverticulitis   . Hyperlipidemia   . Hypertension   . IBS (irritable bowel syndrome)   . Palpitations   . Polymyalgia rheumatica ([place])   . PVC's (premature ventricular contractions)   . SVT (supraventricular tachycardia) ([place])   . Tobacco dependence     Past Surgical History:  Procedure Laterality Date  . NASAL SINUS SURGERY    . REPLACEMENT TOTAL KNEE    . TONSILLECTOMY    . WRIST FRACTURE SURGERY      Social History   Social History  . Marital status: Married    Spouse name: N/A  . Number of children: 2  . Years of education: N/A   Occupational History  .  Retired   Social History Main Topics  . Smoking status: Former Smoker    Packs/day: 0.50    Quit date: 12/12/2011  . Smokeless tobacco: Never Used  . Alcohol use No  . Drug use: No  . Sexual activity: Not on file   Other Topics Concern  . Not on file   Social History Narrative  . No narrative on file    Mobility: Rolling walker Work history: Not obtained   Allergies  Allergen Reactions  . Amoxicillin-Pot Clavulanate     REACTION: diarrhea  . Aspirin Nausea And Vomiting  . Atorvastatin     REACTION: feel weak  . Carisoprodol     REACTION: intolerant  . Codeine     REACTION: hallucinations  . Codeine Phosphate     REACTION: UNKNOWN  . Cortisone     REACTION: pt. reports allergy, reaction not known  . Duoderm Hydroactive     Skin irritation and pain     . Ezetimibe-Simvastatin     REACTION: feels weak  . Naproxen Sodium     REACTION: GI symptoms  . Omeprazole     REACTION: abd pain  . Quinapril  Hcl     REACTION: reports allergy, reaction not known  . Rofecoxib     REACTION: reports allergy, reaction not known  . Silvadene [Silver Sulfadiazine]     Skin irritation and pain     Family History  Problem Relation Age of Onset  . Diabetes Mother   . Heart attack Father   . Heart disease Sister      Prior to Admission medications   Medication Sig Start Date End Date Taking? Authorizing Provider  levothyroxine (SYNTHROID, LEVOTHROID) 100 MCG tablet Take 1 tablet (100 mcg total) by mouth daily. 05/28/16  Yes Abner Greenspan, MD  meclizine (ANTIVERT) 25 MG tablet TAKE 1 TABLET (25 MG TOTAL) BY MOUTH 3 (THREE) TIMES DAILY AS NEEDED FOR DIZZINESS. 03/14/15  Yes Abner Greenspan, MD  metoprolol tartrate (LOPRESSOR) 25 MG tablet TAKE 1 TABLET (25 MG TOTAL) BY MOUTH 2 (TWO) TIMES DAILY. 06/14/16  Yes [employer],  MD  morphine (MS CONTIN) 15 MG 12 hr tablet Take 1 tablet by mouth daily. 01/02/16  Yes Historical Provider, MD  morphine (MS CONTIN) 30 MG 12 hr tablet Take 1 tablet by mouth daily. 01/02/16  Yes Historical Provider, MD  oxybutynin (DITROPAN) 5 MG tablet Take 1 tablet (5 mg total) by mouth 2 (two) times daily. 06/11/16  Yes Pooler, MD  pantoprazole (PROTONIX) 40 MG tablet TAKE 1 TABLET (40 MG TOTAL) BY MOUTH DAILY. 08/26/15  Yes Abner Greenspan, MD  predniSONE (DELTASONE) 5 MG tablet Take 2mg  daily. 08/06/15  Yes Historical Provider, MD  PRESCRIPTION MEDICATION Apply 1 application topically 3 (three) times daily. Triam Silver   Yes Historical Provider, MD  sertraline (ZOLOFT) 50 MG tablet TAKE 1 TABLET BY MOUTH EVERY DAY 03/08/16  Yes Abner Greenspan, MD  spironolactone (ALDACTONE) 25 MG tablet TAKE 1 TABLET (25 MG TOTAL) BY MOUTH DAILY. 05/03/16  Yes Abner Greenspan, MD  trolamine salicylate (ASPERCREME) 10 % cream Apply 1 application topically 2 (two) times daily as needed for muscle pain.   Yes Historical Provider, MD    Physical Exam: Vitals:   06/17/16 0645 06/17/16 0700 06/17/16  0715 06/17/16 0717  BP: 104/75 108/76 107/79 107/79  Pulse: (!) 136 (!) 126 (!) 135 (!) 127  Resp: 14 12 13 13   Temp:      TempSrc:      SpO2: 92% 93% (!) 87% 96%  Weight:      Height:          Constitutional: NAD, calm, comfortable-Appears pale Eyes: PERRL, lids and conjunctivae normal ENMT: Mucous membranes are moist. Posterior pharynx clear of any exudate or lesions Neck: normal, supple, no masses, no thyromegaly Respiratory: clear to auscultation bilaterally, no wheezing, no crackles. Normal respiratory effort. No accessory muscle use.  Cardiovascular: Irregular rate and rhythm with ventricular responses between 110 and 130 bpm, no murmurs / rubs / gallops. No extremity edema. 2+ pedal pulses. No carotid bruits.  Abdomen: no tenderness, no masses palpated. No hepatosplenomegaly. Bowel sounds positive.  Genitourinary: Patient with visualized uncontrolled large volume urinary incontinence during my exam Musculoskeletal: no clubbing / cyanosis. No joint deformity upper and lower extremities. Good ROM, no contractures. Normal muscle tone.  Skin: On lower extremities patient has scattered 0.5-1 cm circumferential papular scaly lesions around the ankles; patient has more significant large skin breakdown with necrotic fibrin in the base at the left medial thigh just below the perineum and extends back to the left gluteal fold with longest length about 10-12 cm with with about 2-4 cm very irregular shaped with significant wound erythema but without purulent drainage Neurologic: CN 2-12 grossly intact. Sensation intact, DTR normal. Strength 5/5 x all 4 extremities.  Psychiatric: Normal judgment and insight. Alert and oriented x 3. Normal mood.    Labs on Admission: I have personally reviewed following labs and imaging studies  CBC:  Recent Labs Lab 06/17/16 0601  WBC 10.6*  NEUTROABS 8.3*  HGB 8.9*  HCT 28.7*  MCV 89.1  PLT 123XX123   Basic Metabolic Panel:  Recent Labs Lab  06/17/16 0305  NA 136  K 3.5  CL 100*  CO2 26  GLUCOSE 141*  BUN 12  CREATININE 0.84  CALCIUM 9.0   GFR: Estimated Creatinine Clearance: 67.4 mL/min (by C-G formula based on SCr of 0.84 mg/dL). Liver Function Tests:  Recent Labs Lab 06/17/16 0305  AST 22  ALT 9*  ALKPHOS 60  BILITOT 0.9  PROT 6.9  ALBUMIN 2.9*   No results for input(s): LIPASE, AMYLASE in the last 168 hours. No results for input(s): AMMONIA in the last 168 hours. Coagulation Profile: No results for input(s): INR, PROTIME in the last 168 hours. Cardiac Enzymes: No results for input(s): CKTOTAL, CKMB, CKMBINDEX, TROPONINI in the last 168 hours. BNP (last 3 results)  Recent Labs  09/11/15 1200  PROBNP 326.0*   HbA1C: No results for input(s): HGBA1C in the last 72 hours. CBG: No results for input(s): GLUCAP in the last 168 hours. Lipid Profile: No results for input(s): CHOL, HDL, LDLCALC, TRIG, CHOLHDL, LDLDIRECT in the last 72 hours. Thyroid Function Tests: No results for input(s): TSH, T4TOTAL, FREET4, T3FREE, THYROIDAB in the last 72 hours. Anemia Panel: No results for input(s): VITAMINB12, FOLATE, FERRITIN, TIBC, IRON, RETICCTPCT in the last 72 hours. Urine analysis:    Component Value Date/Time   COLORURINE AMBER (A) 06/17/2016 0327   APPEARANCEUR HAZY (A) 06/17/2016 0327   LABSPEC 1.027 06/17/2016 0327   LABSPEC 1.025 07/22/2015 1216   PHURINE 6.0 06/17/2016 0327   GLUCOSEU NEGATIVE 06/17/2016 0327   GLUCOSEU Negative 07/22/2015 1216   HGBUR MODERATE (A) 06/17/2016 0327   HGBUR trace-lysed 12/27/2006 1459   BILIRUBINUR SMALL (A) 06/17/2016 0327   BILIRUBINUR Negative 07/22/2015 1216   KETONESUR 40 (A) 06/17/2016 0327   PROTEINUR 100 (A) 06/17/2016 0327   UROBILINOGEN 0.2 07/22/2015 1216   NITRITE POSITIVE (A) 06/17/2016 0327   LEUKOCYTESUR MODERATE (A) 06/17/2016 0327   LEUKOCYTESUR Negative 07/22/2015 1216   Sepsis Labs: @LABRCNTIP (procalcitonin:4,lacticidven:4) )No results  found for this or any previous visit (from the past 240 hour(s)).   Radiological Exams on Admission: Dg Chest Portable 1 View  Result Date: 06/17/2016 CLINICAL DATA:  Chest pain Code sepsis EXAM: PORTABLE CHEST 1 VIEW COMPARISON:  04/02/2016 FINDINGS: Chronic diffuse interstitial changes are again noted. Probable scarring at the left lung base. Low lung volumes. No gross consolidation. Cannot exclude tiny left effusion. Stable mild cardiomegaly with atherosclerosis. No pneumothorax. Degenerative changes of the right shoulder. IMPRESSION: 1. Stable mild cardiomegaly. 2. Diffuse interstitial changes, chronic. Cannot rule out tiny left effusion. 3. Atherosclerosis of the aorta Electronically Signed   By: Donavan Foil M.D.   On: 06/17/2016 03:37    EKG: (Independently reviewed) Atrial fibrillation with ventricular rate 163 bpm, QTC 462 ms, nonspecific ST changes not consistent with ischemia - these are generalized  Assessment/Plan Principal Problem:   Chest pain -Presented with chest pain later found to be in atrial fibrillation with RVR-currently chest pain-free -Prior to last night no episodes of chest pain (including with exertion) -Cycle troponin -Echocardiogram completed February 2017 and would only repeat troponins significantly elevated and concerning for non-STEMI -Cardiac risk factors include: Female postmenopausal greater than age 66, prior tobacco use, hypertension, dyslipidemia, positive family history of father and sister with cardiovascular disease  Active Problems:   New-onset atrial fibrillation with RVR /History of SVT (supraventricular tachycardia)  -Rate better controlled after 3.5 L fluid bolus and utilization of Cardizem infusion -Continue Cardizem infusion and titrate to keep heart rate less than or equal to 100 bpm -Degree of dehydration seems to be contributing and may require additional IV fluids -Recent TSH low and may be contributing to tachycardia as well therefore  repeat TSH with free T4 and T3 (see below) -CHADVASC = 4 -Initial anticoagulation with full dose Lovenox; need to discuss further with patient Coumadin versus NOAC -Have asked case manager to assist with determining best co-pay for NOAC -?  Due to symptomatic anemia (see below)    UTI (urinary tract infection)/history of chronic Urinary incontinence -Chronic urinary incontinence -Reports has seen urologist once but never followed back up due to other issues -Recently started on oxybutynin by PCP but patient reports not taking due to dizziness and significant dry mouth -No history of recurrent UTIs or recent antibiotics so appropriate narrow antibiotics to pharmacist recommendation of Levaquin -Follow up on blood cultures and urine culture    SIRS (systemic inflammatory response syndrome)  -Initially treated as code sepsis due to presentation with significant tachycardia and fever greater than 100.9 -Mains elevated greater than 100 bpm in setting of new onset atrial fibrillation but otherwise patient vital signs are within normal limits with slightly suboptimal blood pressure in setting of presumed dehydration -Lactic acid normal and no evidence of end organ involvement/acute organ failure -Patient does have slightly low blood pressures with normal MAP, her mentation is normal and as noted her respiratory rate is normal and therefore does not meet sepsis criteria    Hypothyroidism -Recent outpatient TSH 0.04 which is low prompting PCP to decrease Synthroid from 118 to 100 g daily -Given presentation with RVR and new onset atrial fibrillation will repeat TSH with free T4 and T3 -Continue Synthroid for now    Essential hypertension  -Current blood pressure suboptimal and we are utilizing calcium channel blocker for rate control therefore will hold home Lopressor    COPD (chronic obstructive pulmonary disease)  -History of prior tobacco abuse -Currently asymptomatic without wheezing or other  respiratory symptoms -Not on MDIs or other bronchodilators at home    Open wound of left thigh/ left gluteal fold -Ongoing issue and has proven difficult to treat -Suspect chronicity related to chronic and recurrent urinary incontinence -Has been evaluated by wound care center as an outpatient -We'll ask WOC RN to evaluate patient this admission -Air mattress    Anemia/non-volitional weight loss -Baseline hemoglobin 11.6 -Current hemoglobin 8.9 -Patient with non-volitional weight loss of about 20 pounds since August of this year which could be related to concomitant use of diuretic at home but given anemia may need to explore further -Follow up on TSH -Check anemia panel -Check stools for blood -See below under MGUS    HYPERCHOLESTEROLEMIA -Not on statin prior to admission -Given presentation with chest pain need to repeat lipid panel this admission    Chronic back pain -Followed by Dr. Hardin Negus (Pain clinic) as an outpatient -Patient unclear on her MS Contin dosing and has both a 15 mg and a 30 mg tablet listed daily. She reports she takes both of those peels but is uncertain if she takes them both together or divided therefore I have given her her lower dosage in the morning and her higher dosage at night but this may need to be readjusted once this is better clarified -Based on oncologist's note patient does have history of sacral insufficiency fractures right greater than left    Depression -Continue Zoloft    PMR (polymyalgia rheumatica)  -Continue low-dose chronic prednisone -Followed by Dr. Estanislado Pandy -Also a component of her chronic pain syndrome    MGUS (monoclonal gammopathy of unknown significance) -Last saw Dr. Irene Limbo July 2017 -November 2016 PET scan demonstrated no evidence of hypermetabolic bone lesions and at that time she was not experiencing any anemia renal failure hypercalcemia -Based on his notes: significantly altered A lambda light chain ratio along with  non-IgG MGUS suggesting a higher risk of progression to a more concerning clonal plasma  cell dyscrasia;  she did have an elevated microglobulin of 3.74 -Repeat myeloma labs 11/18/15 showed no clear M spike but IFE still demonstrated IgG Monoclonal protein and serum free light chain showed increasing Her free light chains from 24.8 in November 2016 to 187.6 and patient was developing some mild anemia with a drop in hemoglobin from 13.6 to11.6 -Because of the increase in serum free light chains oncology planned a whole body bone survey which has apparently ordered but not yet completed -Myeloma labs were repeated in July and this demonstrated further increase in the Ig Kappa free light chains to 263 with the Ig lambda free light chains up to 12.3 and the capital lambda ratio up to 21.37 -Patient was also found to have a hypermetabolic area on the liver adjacent to the gallbladder with an MRI of the liver ordered but patient refused to have imaging done stating "they were too many things happening in my life"-an abdominal ultrasound was obtained instead which showed a hepatic hemangioma of the left lobe and a complex appearing non-mobile focus in the gallbladder fundus prompting referral to general surgery for further evaluation with recommendation to obtain an MRI of her abdomen to better evaluate the gallbladder mass -Given all of the above may need to pursue MRI of the abdomen while here (abnormal previous findings on abdominal ultrasound as well as progressive anemia and non-volitional weight loss) -Dr. Irene Limbo added to consultant list      DVT prophylaxis: Lovenox Code Status: Full Family Communication: No family at bedside  Disposition Plan: Anticipate discharge back to preadmission home environment once medically stable Consults called: Hematology/ Dr. Irene Limbo added to consultant list-office called and formal consultation requested-[place] on-call 10/26    Bunyan Brier L. ANP-BC [facility] Pager [phone]   If 7PM-7AM, please contact night-coverage [email or web address] Password [email or web address]  06/17/2016, 7:58 AM

## 2016-06-17 NOTE — Consult Note (Addendum)
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 Area continues to extend, and be more painful to touch.  Appears atypical.  Pressure Ulcer POA: N/A Measurement:8x3x0,4 Wound [id number] pink 80%, yellow slough 20%, multiple lesions that possibly connect under skin.  Drainage (amount, consistency, odor) Small serous Periwound:scarring, moist Dressing procedure/placement/frequency:Apply santyl to wound bed, covered with NS moist gauze, cover with dry gauze and ABD pad secure with Medipore tape.  Patient would benefit from an Ultrasound to the thigh to rule out an abscess, and she would also benefit from a foley catheter to prevent moisture from urinary incontinence if admitted to the hospital.  Lenard Simmer [place] student Please re-consult if further assistance is needed.  Thank-you,  Julien Girt MSN, [place], [place], [place], [place]

## 2016-06-17 NOTE — Progress Notes (Addendum)
RN informs me outpatient MR abdomen ordered by Dr. Zella Richer with surgery was scheduled to be done today. We have canceled due to patient instability but will need to be reordered while here and completed prior to discharge. An outpatient bone scan ordered by Dr. Irene Limbo has also been canceled since patient is admitted to the hospital.  In addition TSH remains low with slightly elevated free T4. Synthroid has been discontinued for now-she did receive her a.m. dose today.  Erin Hearing, ANP

## 2016-06-17 NOTE — Progress Notes (Signed)
Pharmacy Antibiotic Note  [name] is a 80 y.o. female admitted on 06/17/2016 with sepsis.  Pharmacy has been consulted for Vancomycin/Aztreonam/Levaquin dosing. WBC 10.6. Renal function good. UTI likely source.   Plan: -Vancomycin 1000 mg IV q12h -Levaquin 750 mg IV q24h -Aztreonam 1g IV q8h -Should be able to change Levaquin/Aztreonam to Cefepime once admitting MD has seen patient -Trend WBC, temp, renal function  -Drug levels as indicated   Height: 5\' 7"  (170.2 cm) Weight: 223 lb (101.2 kg) IBW/kg (Calculated) : 61.6  Temp (24hrs), Avg:100.3 F (37.9 C), Min:98.9 F (37.2 C), Max:101.6 F (38.7 C)   Recent Labs Lab 06/17/16 0305 06/17/16 0320 06/17/16 0601 06/17/16 0609  WBC  --   --  10.6*  --   CREATININE 0.84  --   --   --   LATICACIDVEN  --  1.69  --  0.76    Estimated Creatinine Clearance: 67.4 mL/min (by C-G formula based on SCr of 0.84 mg/dL).    Allergies  Allergen Reactions  . Amoxicillin-Pot Clavulanate     REACTION: diarrhea  . Aspirin Nausea And Vomiting  . Atorvastatin     REACTION: feel weak  . Carisoprodol     REACTION: intolerant  . Codeine     REACTION: hallucinations  . Codeine Phosphate     REACTION: UNKNOWN  . Cortisone     REACTION: pt. reports allergy, reaction not known  . Duoderm Hydroactive     Skin irritation and pain     . Ezetimibe-Simvastatin     REACTION: feels weak  . Naproxen Sodium     REACTION: GI symptoms  . Omeprazole     REACTION: abd pain  . Quinapril Hcl     REACTION: reports allergy, reaction not known  . Rofecoxib     REACTION: reports allergy, reaction not known  . Silvadene [Silver Sulfadiazine]     Skin irritation and pain      Narda Bonds 06/17/2016 6:51 AM

## 2016-06-18 DIAGNOSIS — N3000 Acute cystitis without hematuria: Secondary | ICD-10-CM

## 2016-06-18 DIAGNOSIS — D472 Monoclonal gammopathy: Secondary | ICD-10-CM

## 2016-06-18 DIAGNOSIS — N3946 Mixed incontinence: Secondary | ICD-10-CM

## 2016-06-18 LAB — BASIC METABOLIC PANEL
Anion gap: 7 (ref 5–15)
BUN: 9 mg/dL (ref 6–20)
CO2: 23 mmol/L (ref 22–32)
CREATININE: 0.64 mg/dL (ref 0.44–1.00)
Calcium: 8.3 mg/dL — ABNORMAL LOW (ref 8.9–10.3)
Chloride: 109 mmol/L (ref 101–111)
GFR calc Af Amer: >60 mL/min (ref >60)
GLUCOSE: 88 mg/dL (ref 65–99)
Potassium: 3.1 mmol/L — ABNORMAL LOW (ref 3.5–5.1)
SODIUM: 139 mmol/L (ref 135–145)

## 2016-06-18 LAB — IRON AND TIBC
IRON: 16 ug/dL — AB (ref 28–170)
SATURATION RATIOS: 9 % — AB (ref 10.4–31.8)
TIBC: 178 ug/dL — AB (ref 250–450)
UIBC: 162 ug/dL

## 2016-06-18 LAB — CBC WITH DIFFERENTIAL/PLATELET
BASOS ABS: 0 10*3/uL (ref 0.0–0.1)
Basophils Relative: 0 %
EOS ABS: 0 10*3/uL (ref 0.0–0.7)
EOS PCT: 0 %
HCT: 29.7 % — ABNORMAL LOW (ref 36.0–46.0)
Hemoglobin: 9.4 g/dL — ABNORMAL LOW (ref 12.0–15.0)
Lymphocytes Relative: 26 %
Lymphs Abs: 2.2 10*3/uL (ref 0.7–4.0)
MCH: 28 pg (ref 26.0–34.0)
MCHC: 31.6 g/dL (ref 30.0–36.0)
MCV: 88.4 fL (ref 78.0–100.0)
MONO ABS: 0.6 10*3/uL (ref 0.1–1.0)
Monocytes Relative: 7 %
Neutro Abs: 5.6 10*3/uL (ref 1.7–7.7)
Neutrophils Relative %: 67 %
PLATELETS: 192 10*3/uL (ref 150–400)
RBC: 3.36 MIL/uL — AB (ref 3.87–5.11)
RDW: 15.1 % (ref 11.5–15.5)
WBC: 8.5 10*3/uL (ref 4.0–10.5)

## 2016-06-18 LAB — TROPONIN I
TROPONIN I: 0.05 ng/mL — AB (ref <0.03)
Troponin I: 0.03 ng/mL (ref <0.03)
Troponin I: <0.03 ng/mL (ref <0.03)

## 2016-06-18 LAB — FOLATE: Folate: 7.7 ng/mL (ref >5.9)

## 2016-06-18 LAB — HEMOGLOBIN A1C
Hgb A1c MFr Bld: 5.7 % — ABNORMAL HIGH (ref 4.8–5.6)
Mean Plasma Glucose: 117 mg/dL

## 2016-06-18 LAB — VITAMIN B12: Vitamin B-12: 291 pg/mL (ref 180–914)

## 2016-06-18 LAB — RETICULOCYTES
RBC.: 3.36 MIL/uL — ABNORMAL LOW (ref 3.87–5.11)
Retic Count, Absolute: 63.8 10*3/uL (ref 19.0–186.0)
Retic Ct Pct: 1.9 % (ref 0.4–3.1)

## 2016-06-18 LAB — LIPID PANEL
CHOL/HDL RATIO: 2.9 ratio
Cholesterol: 112 mg/dL (ref 0–200)
HDL: 38 mg/dL — AB (ref >40)
LDL CALC: 63 mg/dL (ref 0–99)
Triglycerides: 56 mg/dL (ref <150)
VLDL: 11 mg/dL (ref 0–40)

## 2016-06-18 LAB — FERRITIN: Ferritin: 129 ng/mL (ref 11–307)

## 2016-06-18 LAB — MAGNESIUM: MAGNESIUM: 2.1 mg/dL (ref 1.7–2.4)

## 2016-06-18 MED ORDER — OXYCODONE HCL 5 MG PO TABS
5.0000 mg | ORAL_TABLET | ORAL | Status: DC | PRN
  Administered 2016-06-18 – 2016-06-21 (×10): 10 mg via ORAL
  Administered 2016-06-21 (×2): 5 mg via ORAL
  Administered 2016-06-22 (×2): 10 mg via ORAL
  Administered 2016-06-22: 5 mg via ORAL
  Administered 2016-06-22 – 2016-06-23 (×2): 10 mg via ORAL
  Administered 2016-06-23: 5 mg via ORAL
  Administered 2016-06-23 – 2016-06-24 (×4): 10 mg via ORAL
  Filled 2016-06-18: qty 1
  Filled 2016-06-18 (×6): qty 2
  Filled 2016-06-18: qty 1
  Filled 2016-06-18 (×5): qty 2
  Filled 2016-06-18: qty 1
  Filled 2016-06-18 (×2): qty 2
  Filled 2016-06-18: qty 1
  Filled 2016-06-18 (×5): qty 2

## 2016-06-18 MED ORDER — DILTIAZEM HCL-DEXTROSE 100-5 MG/100ML-% IV SOLN (PREMIX)
10.0000 mg/h | INTRAVENOUS | Status: DC
  Administered 2016-06-18: 5 mg/h via INTRAVENOUS
  Administered 2016-06-19: 10 mg/h via INTRAVENOUS
  Filled 2016-06-18 (×2): qty 100

## 2016-06-18 MED ORDER — POTASSIUM CHLORIDE CRYS ER 20 MEQ PO TBCR
40.0000 meq | EXTENDED_RELEASE_TABLET | Freq: Two times a day (BID) | ORAL | Status: DC
  Administered 2016-06-18 – 2016-06-19 (×2): 40 meq via ORAL
  Filled 2016-06-18 (×2): qty 2

## 2016-06-18 MED ORDER — CYANOCOBALAMIN 1000 MCG/ML IJ SOLN
1000.0000 ug | Freq: Every day | INTRAMUSCULAR | Status: AC
  Administered 2016-06-18 – 2016-06-20 (×3): 1000 ug via SUBCUTANEOUS
  Filled 2016-06-18 (×3): qty 1

## 2016-06-18 MED ORDER — METOPROLOL TARTRATE 25 MG PO TABS
25.0000 mg | ORAL_TABLET | Freq: Two times a day (BID) | ORAL | Status: DC
  Administered 2016-06-18 – 2016-06-20 (×4): 25 mg via ORAL
  Filled 2016-06-18 (×4): qty 1

## 2016-06-18 NOTE — Telephone Encounter (Signed)
Left voicemail requesting pt to call the office back 

## 2016-06-18 NOTE — Progress Notes (Signed)
Pharmacy Antibiotic Note [name] is a 80 y.o. female admitted on 06/17/2016 with sepsis.  Patient is currently on day 2 of Levaquin for treatment.    Plan: - Continue Levaquin 750 mg IV q24h for now - If desired can transition to or stepdown to cephalosporin for treatment with reported allergy to PCN being diarrhea    Height: 5\' 7"  (170.2 cm) Weight: 226 lb (102.5 kg) IBW/kg (Calculated) : 61.6  Temp (24hrs), Avg:98.6 F (37 C), Min:98.3 F (36.8 C), Max:99.4 F (37.4 C)   Recent Labs Lab 06/17/16 0305 06/17/16 0320 06/17/16 0601 06/17/16 0609 06/18/16 0138  WBC  --   --  10.6*  --  8.5  CREATININE 0.84  --   --   --  0.64  LATICACIDVEN  --  1.69  --  0.76  --     Estimated Creatinine Clearance: 71.4 mL/min (by C-G formula based on SCr of 0.64 mg/dL).    Allergies  Allergen Reactions  . Amoxicillin-Pot Clavulanate     REACTION: diarrhea  . Aspirin Nausea And Vomiting  . Atorvastatin     REACTION: feel weak  . Carisoprodol     REACTION: intolerant  . Codeine     REACTION: hallucinations  . Codeine Phosphate     REACTION: UNKNOWN  . Cortisone     REACTION: pt. reports allergy, reaction not known  . Duoderm Hydroactive     Skin irritation and pain     . Ezetimibe-Simvastatin     REACTION: feels weak  . Naproxen Sodium     REACTION: GI symptoms  . Omeprazole     REACTION: abd pain  . Quinapril Hcl     REACTION: reports allergy, reaction not known  . Rofecoxib     REACTION: reports allergy, reaction not known  . Silvadene [Silver Sulfadiazine]     Skin irritation and pain    Antimicrobials :  Levaquin 10/26>> Aztreo x 1 10/26 Vanc x 1 10/26  Dose adjustments this admission: N/a  Microbiology results:  10/26 BCx: px 10/26 UCx: px  10/26 MRSA PCR: px  Vincenza Hews, PharmD, BCPS 06/18/2016, 8:17 AM Pager: [phone]

## 2016-06-18 NOTE — Progress Notes (Signed)
[place] TEAM 1 - Stepdown/ICU TEAM  KRYSTALE BIONDI  [id number] DOB: 25-Aug-1935 DOA: 06/17/2016 PCP: [name], MD    Brief Narrative:  80 y.o. female with a history of prior SVT, chronic urinary incontinence with associated chronic wound involving the left medial thigh extending to the left gluteal fold, COPD, hypertension, hypothyroidism, chronic back pain on MS Contin, depression, dyslipidemia, MGUS, and polymyalgia rheumatica on chronic low-dose prednisone who c/o chest pain that was constant in nature with mild shortness of breath. In the ER she was found to be in atrial fibrillation with RVR at 150. The patient has unintentionally lost nearly 20 pounds since August 2017.   Subjective: The patient complains of severe pain in her left second toe.  She denies current chest pain nausea vomiting palpitations or shortness of breath.  Assessment & Plan:  Chest pain  -resolved - troponin not significantly elevated  New-onset atrial fibrillation with RVR / History of SVT (supraventricular tachycardia)  -Continue Cardizem infusion and titrate to keep heart rate less than or equal to 100 bpm -Dehydration, hypokalemia, hypomagnesemia, and iatrogenic hyperthyroidism all likely contributing  -Patient would not be a candidate for defibrillation until these issues are corrected -CHADVASC 4 - full dose Lovenox for now  -We will consult cardiology once reversible issues are better managed  E coli and Kleb UTI / history of chronic Urinary incontinence -has seen Urologist once but never followed back up due to other issues -not taking oxybutynin due to dizziness and dry mouth -Continue empiric antibiotic therapy until sensitivities available  Hypothyroidism -10/20 outpatient TSH 0.04 > PCP decreased Synthroid from 118 to 100 g daily - too early for this to be reflected in f/u labs so current TSH/FT4 not helpful - hold Synthroid for now and follow  Essential hypertension  -BP  currently well controlled   COPD  -Currently asymptomatic   Chronic Open wound of left thigh / left gluteal fold -Ongoing issue and has proven difficult to treat - related to chronic and recurrent urinary incontinence - WOC following   Anemia -Baseline hemoglobin 11.6 - admit hemoglobin 8.9 - Fe studies most c/w malnutrition or ACD - B12 marginal at 291 - replaced B-12 and follow  Non-volitional weight loss -loss of about 20 pounds since August of this year   Chronic back pain -Followed by Dr. Hardin Negus (Pain clinic) as an outpatient  Depression -Continue Zoloft  PMR  -Continue low-dose chronic prednisone - followed by Dr. Estanislado Pandy  [facility]  -Oncology has seen in hospital   L third toe ischemia Suspicious for embolic event, possibly related to afib - remainder of foot is warm w/ 1+ DP pulse - 2nd toe looks benign though it is the one she says hurts - check ABIs - allow toe to demarginate but for now appears that only tip of toe affected    DVT prophylaxis: lovenox  Code Status: FULL CODE Family Communication: Spoke with patient and daughter at bedside at length Disposition Plan: SDU  Consultants:  Heme/Onc  Procedures: none  Antimicrobials:  Levaquin 10/25 >  Objective: Blood pressure 108/67, pulse (!) 122, temperature 98 F (36.7 C), temperature source Oral, resp. rate 18, height 5\' 7"  (1.702 m), weight 102.5 kg (226 lb), SpO2 93 %.  Intake/Output Summary (Last 24 hours) at 06/18/16 1614 Last data filed at 06/18/16 1500  Gross per 24 hour  Intake          1057.37 ml  Output  275 ml  Net           782.37 ml   Filed Weights   06/17/16 0254 06/17/16 1729  Weight: 101.2 kg (223 lb) 102.5 kg (226 lb)    Examination: General: No acute respiratory distress Lungs: Clear to auscultation bilaterally without wheezes or crackles Cardiovascular: Irregularly irregular with heart rate at approximately 120 bpm without appreciable murmur Abdomen:  Nontender, nondistended, soft, bowel sounds positive, no rebound, no ascites, no appreciable mass Extremities: No significant edema bilateral lower extremities - purple discoloration of the very distal tip of the left third toe with no apparent discoloration of the surrounding toes  CBC:  Recent Labs Lab 06/17/16 0601 06/18/16 0138  WBC 10.6* 8.5  NEUTROABS 8.3* 5.6  HGB 8.9* 9.4*  HCT 28.7* 29.7*  MCV 89.1 88.4  PLT 170 [id number]   Basic Metabolic Panel:  Recent Labs Lab 06/17/16 0305 06/17/16 1331 06/18/16 0138  NA 136  --  139  K 3.5  --  3.1*  CL 100*  --  109  CO2 26  --  23  GLUCOSE 141*  --  88  BUN 12  --  9  CREATININE 0.84  --  0.64  CALCIUM 9.0  --  8.3*  MG  --  1.5* 2.1  PHOS  --  2.9  --    GFR: Estimated Creatinine Clearance: 71.4 mL/min (by C-G formula based on SCr of 0.64 mg/dL).  Liver Function Tests:  Recent Labs Lab 06/17/16 0305  AST 22  ALT 9*  ALKPHOS 60  BILITOT 0.9  PROT 6.9  ALBUMIN 2.9*    Cardiac Enzymes:  Recent Labs Lab 06/17/16 1331 06/17/16 1955 06/18/16 0138 06/18/16 0647 06/18/16 1316  TROPONINI 0.04* 0.04* 0.05* 0.03* <0.03    HbA1C: Hgb A1c MFr Bld  Date/Time Value Ref Range Status  06/17/2016 07:45 AM 5.7 (H) 4.8 - 5.6 % Final    Comment:    (NOTE)         Pre-diabetes: 5.7 - 6.4         Diabetes: >6.4         Glycemic control for adults with diabetes: <7.0      Recent Results (from the past 240 hour(s))  Blood Culture (routine x 2)     Status: None (Preliminary result)   Collection Time: 06/17/16  3:05 AM  Result Value Ref Range Status   Specimen Description BLOOD LEFT ARM  Final   Special Requests BOTTLES DRAWN AEROBIC AND ANAEROBIC 5ML  Final   Culture NO GROWTH 1 DAY  Final   Report Status PENDING  Incomplete  Blood Culture (routine x 2)     Status: None (Preliminary result)   Collection Time: 06/17/16  3:20 AM  Result Value Ref Range Status   Specimen Description BLOOD LEFT HAND  Final   Special  Requests IN PEDIATRIC BOTTLE 3ML  Final   Culture NO GROWTH 1 DAY  Final   Report Status PENDING  Incomplete  Urine culture     Status: Abnormal (Preliminary result)   Collection Time: 06/17/16  3:27 AM  Result Value Ref Range Status   Specimen Description URINE, CATHETERIZED  Final   Special Requests NONE  Final   Culture (A)  Final    >=100,000 COLONIES/mL KLEBSIELLA PNEUMONIAE >=100,000 COLONIES/mL ESCHERICHIA COLI SUSCEPTIBILITIES TO FOLLOW    Report Status PENDING  Incomplete  MRSA PCR Screening     Status: None   Collection Time: 06/17/16  5:36 PM  Result Value Ref Range Status   MRSA by PCR NEGATIVE NEGATIVE Final    Comment:        The GeneXpert MRSA Assay (FDA approved for NASAL specimens only), is one component of a comprehensive MRSA colonization surveillance program. It is not intended to diagnose MRSA infection nor to guide or monitor treatment for MRSA infections.      Scheduled Meds: . collagenase   Topical Daily  . enoxaparin (LOVENOX) injection  1 mg/kg Subcutaneous Q12H  . feeding supplement (ENSURE ENLIVE)  237 mL Oral BID BM  . levofloxacin (LEVAQUIN) IV  750 mg Intravenous Q24H  . morphine  15 mg Oral Daily  . morphine  30 mg Oral QHS  . pantoprazole  40 mg Oral Daily  . predniSONE  2.5 mg Oral Q breakfast  . sertraline  50 mg Oral Daily   Continuous Infusions: . diltiazem (CARDIZEM) infusion 5 mg/hr (06/18/16 1232)     LOS: 1 day   Cherene Altes, MD [facility] Office  [phone] Pager - Text Page per Shea Evans as per below:  On-Call/Text Page:      [email or web address]      password [email or web address]  If 7PM-7AM, please contact night-coverage [email or web address] Password [email or web address] 06/18/2016, 4:14 PM

## 2016-06-18 NOTE — Progress Notes (Signed)
Initial Nutrition Assessment  DOCUMENTATION CODES:   Obesity unspecified  INTERVENTION:    Continue Ensure Enlive po BID, each supplement provides 350 kcal and 20 grams of protein  NUTRITION DIAGNOSIS:   Increased nutrient needs related to wound healing as evidenced by estimated needs  GOAL:   Patient will meet greater than or equal to 90% of their needs  MONITOR:   PO intake, Supplement acceptance, Labs, Weight trends, I & O's  REASON FOR ASSESSMENT:   Malnutrition Screening Tool  ASSESSMENT:   80 y.o. female with a PMH significant for MGUS, prior SVT, low thyroid who presents with chest discomfort.  Patient reports a poor appetite >> had a little breakfast. No % PO intake records available in flowsheets. CWOCN note reviewed >> chronic wound to R thigh. Per readings below, pt has lost 20 lbs since August 2017. Has Ensure Enlive supplements ordered BID.  Nutrition focused physical exam completed.  No muscle or subcutaneous fat depletion noticed.  Diet Order:  Diet heart healthy/carb modified Room service appropriate? Yes; Fluid consistency: Thin  Skin:  Reviewed, no issues  Last BM:  10/25  Height:   Ht Readings from Last 1 Encounters:  06/17/16 5\' 7"  (1.702 m)    Weight:   Wt Readings from Last 1 Encounters:  06/17/16 226 lb (102.5 kg)    Ideal Body Weight:  61.3 kg  BMI:  Body mass index is 35.4 kg/m.  Estimated Nutritional Needs:   Kcal:  1800-2000  Protein:  90-100 gm  Fluid:  1.8-2.0 L  EDUCATION NEEDS:   No education needs identified at this time  Arthur Holms, RD, LDN Pager #: [phone] After-Hours Pager #: [phone]

## 2016-06-19 ENCOUNTER — Encounter (HOSPITAL_COMMUNITY): Payer: Self-pay | Admitting: *Deleted

## 2016-06-19 ENCOUNTER — Encounter (HOSPITAL_COMMUNITY): Payer: 59

## 2016-06-19 LAB — COMPREHENSIVE METABOLIC PANEL
ALBUMIN: 2.1 g/dL — AB (ref 3.5–5.0)
ALK PHOS: 51 U/L (ref 38–126)
ALT: 10 U/L — AB (ref 14–54)
AST: 16 U/L (ref 15–41)
Anion gap: 7 (ref 5–15)
BILIRUBIN TOTAL: 0.6 mg/dL (ref 0.3–1.2)
BUN: 11 mg/dL (ref 6–20)
CALCIUM: 8.5 mg/dL — AB (ref 8.9–10.3)
CO2: 24 mmol/L (ref 22–32)
CREATININE: 0.76 mg/dL (ref 0.44–1.00)
Chloride: 108 mmol/L (ref 101–111)
GFR calc non Af Amer: >60 mL/min (ref >60)
GLUCOSE: 109 mg/dL — AB (ref 65–99)
Potassium: 3.6 mmol/L (ref 3.5–5.1)
SODIUM: 139 mmol/L (ref 135–145)
TOTAL PROTEIN: 5.1 g/dL — AB (ref 6.5–8.1)

## 2016-06-19 LAB — URINE CULTURE

## 2016-06-19 LAB — CBC
HCT: 30.8 % — ABNORMAL LOW (ref 36.0–46.0)
Hemoglobin: 9.6 g/dL — ABNORMAL LOW (ref 12.0–15.0)
MCH: 27.4 pg (ref 26.0–34.0)
MCHC: 31.2 g/dL (ref 30.0–36.0)
MCV: 87.7 fL (ref 78.0–100.0)
PLATELETS: 229 10*3/uL (ref 150–400)
RBC: 3.51 MIL/uL — ABNORMAL LOW (ref 3.87–5.11)
RDW: 15.3 % (ref 11.5–15.5)
WBC: 8.1 10*3/uL (ref 4.0–10.5)

## 2016-06-19 LAB — MAGNESIUM: Magnesium: 1.8 mg/dL (ref 1.7–2.4)

## 2016-06-19 LAB — IGG, IGA, IGM
IGG (IMMUNOGLOBIN G), SERUM: 1036 mg/dL (ref 700–1600)
IGM, SERUM: 24 mg/dL — AB (ref 26–217)
IgA: 186 mg/dL (ref 64–422)

## 2016-06-19 LAB — T3: T3 TOTAL: 75 ng/dL (ref 71–180)

## 2016-06-19 LAB — CORTISOL: Cortisol, Plasma: 6.3 ug/dL

## 2016-06-19 MED ORDER — POTASSIUM CHLORIDE CRYS ER 20 MEQ PO TBCR
40.0000 meq | EXTENDED_RELEASE_TABLET | Freq: Two times a day (BID) | ORAL | Status: DC
  Administered 2016-06-19 – 2016-06-20 (×2): 40 meq via ORAL
  Filled 2016-06-19 (×2): qty 2

## 2016-06-19 MED ORDER — MAGNESIUM SULFATE 2 GM/50ML IV SOLN
2.0000 g | Freq: Once | INTRAVENOUS | Status: AC
  Administered 2016-06-19: 2 g via INTRAVENOUS
  Filled 2016-06-19: qty 50

## 2016-06-19 MED ORDER — CEPHALEXIN 500 MG PO CAPS
500.0000 mg | ORAL_CAPSULE | Freq: Two times a day (BID) | ORAL | Status: DC
  Administered 2016-06-19 – 2016-06-24 (×10): 500 mg via ORAL
  Filled 2016-06-19 (×13): qty 1

## 2016-06-19 NOTE — Progress Notes (Signed)
[place] TEAM 1 - Stepdown/ICU TEAM  [name]  [id number] DOB: [birth year] DOA: 06/17/2016 PCP: [name], MD    Brief Narrative:  80 y.o. female with a history of prior SVT, chronic urinary incontinence with associated chronic wound involving the left medial thigh extending to the left gluteal fold, COPD, hypertension, hypothyroidism, chronic back pain on MS Contin, depression, dyslipidemia, MGUS, and polymyalgia rheumatica on chronic low-dose prednisone who c/o chest pain that was constant in nature with mild shortness of breath. In the ER she was found to be in atrial fibrillation with RVR at 150. The patient has unintentionally lost nearly 20 pounds since August 2017.   Subjective: The pt reports near resolution of pain in her toe.  She denies cp, sob, n/v, or abdom pain. She feels much better in general per her report.    Assessment & Plan:  Chest pain  -resolved - troponin has normalized after very low peak at 0.07 - no further eval planned   New-onset atrial fibrillation with RVR / History of SVT (supraventricular tachycardia)  -HR now well controlled on BB alone w/ CCB gtt having been stopped  -Dehydration, hypokalemia, hypomagnesemia, and iatrogenic hyperthyroidism all likely contributing  -Patient would not be a candidate for defibrillation until these issues are corrected -CHADVASC 4 - full dose Lovenox for now  -We will consult Cardiology once reversible issues are better managed )anticipate Monday)  Pan sensitive E coli as well as Kleb UTI / history of chronic Urinary incontinence -has seen Urologist once but never followed back up  -not taking oxybutynin due to dizziness and dry mouth -narrow abx tx today and follow   Hypothyroidism -10/20 outpatient TSH 0.04 > PCP decreased Synthroid from 118 to 100 g daily - too early for this to be reflected in f/u labs so current TSH/FT4 not helpful - hold Synthroid for now and follow  Essential hypertension  -BP  currently well controlled   COPD  -Currently asymptomatic   Chronic Open wound of left thigh / left gluteal fold -Ongoing issue and has proven difficult to treat - related to chronic and recurrent urinary incontinence - WOC following   Anemia -Baseline hemoglobin 11.6 - admit hemoglobin 8.9 - Fe studies most c/w malnutrition or ACD - B12 marginal at 291 - replaced B-12 and follow - stable/improving today   Non-volitional weight loss -loss of about 20 pounds since August of this year   Chronic back pain -Followed by Dr. Hardin Negus (Pain clinic) as an outpatient - well controlled presently   Depression -Continue Zoloft  PMR  -Continue low-dose chronic prednisone - followed by Dr. Estanislado Pandy  [facility]  -Oncology has seen in hospital   L third toe ischemia Suspicious for embolic event, possibly related to afib - remainder of foot is warm w/ 1+ DP pulse - 2nd toe looks benign though it is the one she says hurts - check ABIs - allow toe to demarginate but for now appears that only tip of toe affected - exam stable today    DVT prophylaxis: lovenox  Code Status: FULL CODE Family Communication: no family present at time of exam today  Disposition Plan: SDU  Consultants:  Heme/Onc  Procedures: none  Antimicrobials:  Levaquin 10/26 > 10/27 Keflex 10/28 >  Objective: Blood pressure 104/74, pulse 74, temperature 98.1 F (36.7 C), temperature source Oral, resp. rate 12, height 5\' 7"  (1.702 m), weight 102.5 kg (226 lb), SpO2 94 %.  Intake/Output Summary (Last 24 hours) at 06/19/16 1508 Last  data filed at 06/19/16 0500  Gross per 24 hour  Intake           114.16 ml  Output              175 ml  Net           -60.84 ml   Filed Weights   06/17/16 0254 06/17/16 1729  Weight: 101.2 kg (223 lb) 102.5 kg (226 lb)    Examination: General: No acute respiratory distress - alert and conversant  Lungs: Clear to auscultation bilaterally  Cardiovascular: Irregularly irregular with  heart rate at approximately 90 bpm  Abdomen: Nontender, nondistended, soft, bowel sounds positive, no rebound, no ascites, no appreciable mass Extremities: No significant edema bilateral lower extremities - purple discoloration of the very distal tip of the left third toe with no apparent discoloration of the surrounding toes w/o change  CBC:  Recent Labs Lab 06/17/16 0601 06/18/16 0138 06/19/16 0128  WBC 10.6* 8.5 8.1  NEUTROABS 8.3* 5.6  --   HGB 8.9* 9.4* 9.6*  HCT 28.7* 29.7* 30.8*  MCV 89.1 88.4 87.7  PLT 170 192 [id number]   Basic Metabolic Panel:  Recent Labs Lab 06/17/16 0305 06/17/16 1331 06/18/16 0138 06/19/16 0128  NA 136  --  139 139  K 3.5  --  3.1* 3.6  CL 100*  --  109 108  CO2 26  --  23 24  GLUCOSE 141*  --  88 109*  BUN 12  --  9 11  CREATININE 0.84  --  0.64 0.76  CALCIUM 9.0  --  8.3* 8.5*  MG  --  1.5* 2.1 1.8  PHOS  --  2.9  --   --    GFR: Estimated Creatinine Clearance: 71.4 mL/min (by C-G formula based on SCr of 0.76 mg/dL).  Liver Function Tests:  Recent Labs Lab 06/17/16 0305 06/19/16 0128  AST 22 16  ALT 9* 10*  ALKPHOS 60 51  BILITOT 0.9 0.6  PROT 6.9 5.1*  ALBUMIN 2.9* 2.1*    Cardiac Enzymes:  Recent Labs Lab 06/17/16 1331 06/17/16 1955 06/18/16 0138 06/18/16 0647 06/18/16 1316  TROPONINI 0.04* 0.04* 0.05* 0.03* <0.03    HbA1C: Hgb A1c MFr Bld  Date/Time Value Ref Range Status  06/17/2016 07:45 AM 5.7 (H) 4.8 - 5.6 % Final    Comment:    (NOTE)         Pre-diabetes: 5.7 - 6.4         Diabetes: >6.4         Glycemic control for adults with diabetes: <7.0      Recent Results (from the past 240 hour(s))  Blood Culture (routine x 2)     Status: None (Preliminary result)   Collection Time: 06/17/16  3:05 AM  Result Value Ref Range Status   Specimen Description BLOOD LEFT ARM  Final   Special Requests BOTTLES DRAWN AEROBIC AND ANAEROBIC 5ML  Final   Culture NO GROWTH 2 DAYS  Final   Report Status PENDING   Incomplete  Blood Culture (routine x 2)     Status: None (Preliminary result)   Collection Time: 06/17/16  3:20 AM  Result Value Ref Range Status   Specimen Description BLOOD LEFT HAND  Final   Special Requests IN PEDIATRIC BOTTLE 3ML  Final   Culture NO GROWTH 2 DAYS  Final   Report Status PENDING  Incomplete  Urine culture     Status: Abnormal   Collection Time: 06/17/16  3:27 AM  Result Value Ref Range Status   Specimen Description URINE, CATHETERIZED  Final   Special Requests NONE  Final   Culture (A)  Final    >=100,000 COLONIES/mL KLEBSIELLA PNEUMONIAE >=100,000 COLONIES/mL ESCHERICHIA COLI    Report Status 06/19/2016 FINAL  Final   Organism ID, Bacteria KLEBSIELLA PNEUMONIAE (A)  Final   Organism ID, Bacteria ESCHERICHIA COLI (A)  Final      Susceptibility   Escherichia coli - MIC*    AMPICILLIN 4 SENSITIVE Sensitive     CEFAZOLIN <=4 SENSITIVE Sensitive     CEFTRIAXONE <=1 SENSITIVE Sensitive     CIPROFLOXACIN <=0.25 SENSITIVE Sensitive     GENTAMICIN <=1 SENSITIVE Sensitive     IMIPENEM <=0.25 SENSITIVE Sensitive     NITROFURANTOIN <=16 SENSITIVE Sensitive     TRIMETH/SULFA <=20 SENSITIVE Sensitive     AMPICILLIN/SULBACTAM <=2 SENSITIVE Sensitive     PIP/TAZO <=4 SENSITIVE Sensitive     Extended ESBL NEGATIVE Sensitive     * >=100,000 COLONIES/mL ESCHERICHIA COLI   Klebsiella pneumoniae - MIC*    AMPICILLIN 16 RESISTANT Resistant     CEFAZOLIN <=4 SENSITIVE Sensitive     CEFTRIAXONE <=1 SENSITIVE Sensitive     CIPROFLOXACIN <=0.25 SENSITIVE Sensitive     GENTAMICIN <=1 SENSITIVE Sensitive     IMIPENEM <=0.25 SENSITIVE Sensitive     NITROFURANTOIN <=16 SENSITIVE Sensitive     TRIMETH/SULFA <=20 SENSITIVE Sensitive     AMPICILLIN/SULBACTAM <=2 SENSITIVE Sensitive     PIP/TAZO <=4 SENSITIVE Sensitive     Extended ESBL NEGATIVE Sensitive     * >=100,000 COLONIES/mL KLEBSIELLA PNEUMONIAE  MRSA PCR Screening     Status: None   Collection Time: 06/17/16  5:36 PM    Result Value Ref Range Status   MRSA by PCR NEGATIVE NEGATIVE Final    Comment:        The GeneXpert MRSA Assay (FDA approved for NASAL specimens only), is one component of a comprehensive MRSA colonization surveillance program. It is not intended to diagnose MRSA infection nor to guide or monitor treatment for MRSA infections.      Scheduled Meds: . collagenase   Topical Daily  . cyanocobalamin  1,000 mcg Subcutaneous Daily  . enoxaparin (LOVENOX) injection  1 mg/kg Subcutaneous Q12H  . feeding supplement (ENSURE ENLIVE)  237 mL Oral BID BM  . levofloxacin (LEVAQUIN) IV  750 mg Intravenous Q24H  . metoprolol tartrate  25 mg Oral BID  . morphine  15 mg Oral Daily  . morphine  30 mg Oral QHS  . pantoprazole  40 mg Oral Daily  . potassium chloride  40 mEq Oral BID  . predniSONE  2.5 mg Oral Q breakfast  . sertraline  50 mg Oral Daily   Continuous Infusions: . diltiazem (CARDIZEM) infusion Stopped (06/19/16 0426)     LOS: 2 days   Cherene Altes, MD [facility] Office  [phone] Pager - Text Page per Shea Evans as per below:  On-Call/Text Page:      [email or web address]      password [email or web address]  If 7PM-7AM, please contact night-coverage [email or web address] Password [email or web address] 06/19/2016, 3:08 PM

## 2016-06-20 ENCOUNTER — Encounter (HOSPITAL_COMMUNITY): Payer: 59

## 2016-06-20 DIAGNOSIS — S71102D Unspecified open wound, left thigh, subsequent encounter: Secondary | ICD-10-CM

## 2016-06-20 LAB — CBC
HEMATOCRIT: 31.5 % — AB (ref 36.0–46.0)
HEMOGLOBIN: 9.5 g/dL — AB (ref 12.0–15.0)
MCH: 27.3 pg (ref 26.0–34.0)
MCHC: 30.2 g/dL (ref 30.0–36.0)
MCV: 90.5 fL (ref 78.0–100.0)
Platelets: 230 10*3/uL (ref 150–400)
RBC: 3.48 MIL/uL — ABNORMAL LOW (ref 3.87–5.11)
RDW: 15.8 % — ABNORMAL HIGH (ref 11.5–15.5)
WBC: 6.6 10*3/uL (ref 4.0–10.5)

## 2016-06-20 LAB — BASIC METABOLIC PANEL
ANION GAP: 3 — AB (ref 5–15)
BUN: 13 mg/dL (ref 6–20)
CO2: 25 mmol/L (ref 22–32)
Calcium: 8.6 mg/dL — ABNORMAL LOW (ref 8.9–10.3)
Chloride: 111 mmol/L (ref 101–111)
Creatinine, Ser: 0.64 mg/dL (ref 0.44–1.00)
GFR calc Af Amer: >60 mL/min (ref >60)
GFR calc non Af Amer: >60 mL/min (ref >60)
GLUCOSE: 109 mg/dL — AB (ref 65–99)
POTASSIUM: 4.5 mmol/L (ref 3.5–5.1)
Sodium: 139 mmol/L (ref 135–145)

## 2016-06-20 LAB — MAGNESIUM: Magnesium: 2.2 mg/dL (ref 1.7–2.4)

## 2016-06-20 MED ORDER — OXYBUTYNIN CHLORIDE 5 MG PO TABS
5.0000 mg | ORAL_TABLET | Freq: Two times a day (BID) | ORAL | Status: DC
  Administered 2016-06-20 – 2016-06-24 (×8): 5 mg via ORAL
  Filled 2016-06-20 (×8): qty 1

## 2016-06-20 MED ORDER — METOPROLOL TARTRATE 50 MG PO TABS
50.0000 mg | ORAL_TABLET | Freq: Two times a day (BID) | ORAL | Status: DC
  Administered 2016-06-20 – 2016-06-21 (×2): 50 mg via ORAL
  Filled 2016-06-20 (×2): qty 1

## 2016-06-20 NOTE — Progress Notes (Signed)
[place] TEAM 1 - Stepdown/ICU TEAM  [name]  [id number] DOB: 1936/05/06 DOA: 06/17/2016 PCP: [name], MD    Brief Narrative:  80 y.o. female with a history of prior SVT, chronic urinary incontinence with associated chronic wound involving the left medial thigh extending to the left gluteal fold, COPD, hypertension, hypothyroidism, chronic back pain on MS Contin, depression, dyslipidemia, MGUS, and polymyalgia rheumatica on chronic low-dose prednisone who c/o chest pain that was constant in nature with mild shortness of breath. In the ER she was found to be in atrial fibrillation with RVR at 150. The patient has unintentionally lost nearly 20 pounds since August 2017.   Subjective: The patient feels that her PMR is flaring up a bit with stiffness and discomfort in both shoulders.  She denies chest pain shortness of breath fevers or chills.  She denies any further pain in her left foot.  She has not yet been up and out of bed to a significant extent.  Assessment & Plan:  Chest pain  -resolved - troponin has normalized after very low peak at 0.07 - plan to ask Cards to see her in AM   New-onset atrial fibrillation with RVR / History of SVT (supraventricular tachycardia)  -HR variable today w/ episodes in to 120s -Dehydration, hypokalemia, hypomagnesemia, and iatrogenic hyperthyroidism all likely contributing - the initial 3 have been corrected - th hyperthyroidism will take a bit more time to correct  -CHADVASC 4 - full dose Lovenox for now - plan to transition to oral after Cards eval  -We will consult Cardiology in AM to follow this issue long term and to assist in determining if DCCV would eventually be a consideration   Pan sensitive E coli as well as Kleb UTI / history of chronic Urinary incontinence -has seen Urologist once but never followed back up  -not taking oxybutynin due to dizziness and dry mouth -narrow abx tx today and follow   Hypothyroidism -10/20  outpatient TSH 0.04 > PCP decreased Synthroid from 118 to 100 g daily - too early for this to be reflected in f/u labs so current TSH/FT4 not helpful - hold Synthroid for now and follow  Essential hypertension  -BP currently well controlled   COPD  -Currently asymptomatic   Chronic Open wound of left thigh / left gluteal fold -Ongoing issue and has proven difficult to treat - related to chronic and recurrent urinary incontinence - WOC following   Anemia -Baseline hemoglobin 11.6 - admit hemoglobin 8.9 - Fe studies most c/w malnutrition or ACD - B12 marginal at 291 - replaced B-12  Non-volitional weight loss -loss of about 16 pounds since August of this year (~110kg) - I suspect this is mostly accounted for by DH/fluid loss rather than true lean body mass   Filed Weights   06/17/16 1729 06/19/16 0500 06/20/16 0400  Weight: 102.5 kg (226 lb) 102.5 kg (225 lb 15.5 oz) 102.3 kg (225 lb 9.6 oz)    Chronic back pain -Followed by Dr. Hardin Negus (Pain clinic) as an outpatient - well controlled presently   Depression -Continue Zoloft  PMR  -Continue low-dose chronic prednisone - followed by Dr. Estanislado Pandy  [facility]  -Oncology has seen in hospital   L third toe ischemia Suspicious for embolic event, possibly related to afib - remainder of foot is warm w/ 1+ DP pulse - 2nd toe looks benign though it is the one she says hurts - ABIs pending - allow toe to demarginate but for now appears  that only tip of toe affected - exam improved today     DVT prophylaxis: lovenox  Code Status: FULL CODE Family Communication: no family present at time of exam today  Disposition Plan: SDU  Consultants:  Heme/Onc  Procedures: none  Antimicrobials:  Levaquin 10/26 > 10/27 Keflex 10/28 >  Objective: Blood pressure 117/65, pulse 94, temperature 98 F (36.7 C), temperature source Oral, resp. rate 13, height 5\' 7"  (1.702 m), weight 102.3 kg (225 lb 9.6 oz), SpO2 93 %.  Intake/Output Summary  (Last 24 hours) at 06/20/16 1514 Last data filed at 06/20/16 0700  Gross per 24 hour  Intake              410 ml  Output              575 ml  Net             -165 ml   Filed Weights   06/17/16 1729 06/19/16 0500 06/20/16 0400  Weight: 102.5 kg (226 lb) 102.5 kg (225 lb 15.5 oz) 102.3 kg (225 lb 9.6 oz)    Examination: General: No acute respiratory distress - alert and pleasant  Lungs: Clear to auscultation bilaterally - no wheeze  Cardiovascular: Irregularly irregular with heart rate at approximately 105 bpm  Abdomen: Nontender, obese, soft, bowel sounds positive, no rebound Extremities: No significant edema bilateral lower extremities - purple discoloration of the very distal tip of the left third toe almost completely resolved w/ now only a 56mm diameter are affected   CBC:  Recent Labs Lab 06/17/16 0601 06/18/16 0138 06/19/16 0128 06/20/16 0216  WBC 10.6* 8.5 8.1 6.6  NEUTROABS 8.3* 5.6  --   --   HGB 8.9* 9.4* 9.6* 9.5*  HCT 28.7* 29.7* 30.8* 31.5*  MCV 89.1 88.4 87.7 90.5  PLT 170 192 229 [id number]   Basic Metabolic Panel:  Recent Labs Lab 06/17/16 0305 06/17/16 1331 06/18/16 0138 06/19/16 0128 06/20/16 0216  NA 136  --  139 139 139  K 3.5  --  3.1* 3.6 4.5  CL 100*  --  109 108 111  CO2 26  --  23 24 25   GLUCOSE 141*  --  88 109* 109*  BUN 12  --  9 11 13   CREATININE 0.84  --  0.64 0.76 0.64  CALCIUM 9.0  --  8.3* 8.5* 8.6*  MG  --  1.5* 2.1 1.8 2.2  PHOS  --  2.9  --   --   --    GFR: Estimated Creatinine Clearance: 71.3 mL/min (by C-G formula based on SCr of 0.64 mg/dL).  Liver Function Tests:  Recent Labs Lab 06/17/16 0305 06/19/16 0128  AST 22 16  ALT 9* 10*  ALKPHOS 60 51  BILITOT 0.9 0.6  PROT 6.9 5.1*  ALBUMIN 2.9* 2.1*    Cardiac Enzymes:  Recent Labs Lab 06/17/16 1331 06/17/16 1955 06/18/16 0138 06/18/16 0647 06/18/16 1316  TROPONINI 0.04* 0.04* 0.05* 0.03* <0.03    HbA1C: Hgb A1c MFr Bld  Date/Time Value Ref Range Status    06/17/2016 07:45 AM 5.7 (H) 4.8 - 5.6 % Final    Comment:    (NOTE)         Pre-diabetes: 5.7 - 6.4         Diabetes: >6.4         Glycemic control for adults with diabetes: <7.0      Recent Results (from the past 240 hour(s))  Blood Culture (routine x 2)  Status: None (Preliminary result)   Collection Time: 06/17/16  3:05 AM  Result Value Ref Range Status   Specimen Description BLOOD LEFT ARM  Final   Special Requests BOTTLES DRAWN AEROBIC AND ANAEROBIC 5ML  Final   Culture NO GROWTH 3 DAYS  Final   Report Status PENDING  Incomplete  Blood Culture (routine x 2)     Status: None (Preliminary result)   Collection Time: 06/17/16  3:20 AM  Result Value Ref Range Status   Specimen Description BLOOD LEFT HAND  Final   Special Requests IN PEDIATRIC BOTTLE 3ML  Final   Culture NO GROWTH 3 DAYS  Final   Report Status PENDING  Incomplete  Urine culture     Status: Abnormal   Collection Time: 06/17/16  3:27 AM  Result Value Ref Range Status   Specimen Description URINE, CATHETERIZED  Final   Special Requests NONE  Final   Culture (A)  Final    >=100,000 COLONIES/mL KLEBSIELLA PNEUMONIAE >=100,000 COLONIES/mL ESCHERICHIA COLI    Report Status 06/19/2016 FINAL  Final   Organism ID, Bacteria KLEBSIELLA PNEUMONIAE (A)  Final   Organism ID, Bacteria ESCHERICHIA COLI (A)  Final      Susceptibility   Escherichia coli - MIC*    AMPICILLIN 4 SENSITIVE Sensitive     CEFAZOLIN <=4 SENSITIVE Sensitive     CEFTRIAXONE <=1 SENSITIVE Sensitive     CIPROFLOXACIN <=0.25 SENSITIVE Sensitive     GENTAMICIN <=1 SENSITIVE Sensitive     IMIPENEM <=0.25 SENSITIVE Sensitive     NITROFURANTOIN <=16 SENSITIVE Sensitive     TRIMETH/SULFA <=20 SENSITIVE Sensitive     AMPICILLIN/SULBACTAM <=2 SENSITIVE Sensitive     PIP/TAZO <=4 SENSITIVE Sensitive     Extended ESBL NEGATIVE Sensitive     * >=100,000 COLONIES/mL ESCHERICHIA COLI   Klebsiella pneumoniae - MIC*    AMPICILLIN 16 RESISTANT Resistant      CEFAZOLIN <=4 SENSITIVE Sensitive     CEFTRIAXONE <=1 SENSITIVE Sensitive     CIPROFLOXACIN <=0.25 SENSITIVE Sensitive     GENTAMICIN <=1 SENSITIVE Sensitive     IMIPENEM <=0.25 SENSITIVE Sensitive     NITROFURANTOIN <=16 SENSITIVE Sensitive     TRIMETH/SULFA <=20 SENSITIVE Sensitive     AMPICILLIN/SULBACTAM <=2 SENSITIVE Sensitive     PIP/TAZO <=4 SENSITIVE Sensitive     Extended ESBL NEGATIVE Sensitive     * >=100,000 COLONIES/mL KLEBSIELLA PNEUMONIAE  MRSA PCR Screening     Status: None   Collection Time: 06/17/16  5:36 PM  Result Value Ref Range Status   MRSA by PCR NEGATIVE NEGATIVE Final    Comment:        The GeneXpert MRSA Assay (FDA approved for NASAL specimens only), is one component of a comprehensive MRSA colonization surveillance program. It is not intended to diagnose MRSA infection nor to guide or monitor treatment for MRSA infections.      Scheduled Meds: . cephALEXin  500 mg Oral Q12H  . collagenase   Topical Daily  . enoxaparin (LOVENOX) injection  1 mg/kg Subcutaneous Q12H  . feeding supplement (ENSURE ENLIVE)  237 mL Oral BID BM  . metoprolol tartrate  25 mg Oral BID  . morphine  15 mg Oral Daily  . morphine  30 mg Oral QHS  . pantoprazole  40 mg Oral Daily  . predniSONE  2.5 mg Oral Q breakfast  . sertraline  50 mg Oral Daily       LOS: 3 days   Cherene Altes,  MD [facility] Office  [phone] Pager - Text Page per Shea Evans as per below:  On-Call/Text Page:      [email or web address]      password [email or web address]  If 7PM-7AM, please contact night-coverage [email or web address] Password [email or web address] 06/20/2016, 3:14 PM

## 2016-06-20 NOTE — Evaluation (Signed)
Physical Therapy Evaluation Patient Details Name: [name] MRN: [id number] DOB: [birth year] Today's Date: 06/20/2016   History of Present Illness  Patient is a 80 yo female admitted 06/17/16 with chest pain, mild SOB.  Patient with Afib with RVR, UTI, 3rd toe ischemia.     PMH:  SVT, urinary incontinence, wound Lt medial thigh to buttocks, COPD, HTN, chronic back pain, polymyalgia rheumatica, depression    Clinical Impression  Patient presents with problems listed below.  Will benefit from acute PT to maximize functional independence prior to discharge home with husband and family support.  Recommend HHPT at d/c for continued therapy.    Follow Up Recommendations Home health PT;Supervision for mobility/OOB    Equipment Recommendations  None recommended by PT    Recommendations for Other Services       Precautions / Restrictions Precautions Precautions: Fall Restrictions Weight Bearing Restrictions: No      Mobility  Bed Mobility Overal bed mobility: Modified Independent             General bed mobility comments: Increased time and use of rail.  Transfers Overall transfer level: Needs assistance Equipment used: Rolling walker (2 wheeled) Transfers: Sit to/from Stand Sit to Stand: Supervision         General transfer comment: Patient uses proper hand placement.  Assist for safety only.  Ambulation/Gait Ambulation/Gait assistance: Min guard Ambulation Distance (Feet): 30 Feet Assistive device: Rolling walker (2 wheeled) Gait Pattern/deviations: Step-through pattern;Decreased stride length;Trunk flexed Gait velocity: decreased Gait velocity interpretation: Below normal speed for age/gender General Gait Details: Verbal cues to stand upright and remain close to RW.  Assist for balance/safety.  Stairs            Wheelchair Mobility    Modified Rankin (Stroke Patients Only)       Balance Overall balance assessment: Needs  assistance Sitting-balance support: No upper extremity supported;Feet supported Sitting balance-Leahy Scale: Good     Standing balance support: Single extremity supported Standing balance-Leahy Scale: Poor                               Pertinent Vitals/Pain Pain Assessment: 0-10 Pain Score: 5  Pain Location: Lt thigh wound Pain Descriptors / Indicators: Sore;Tender Pain Intervention(s): Limited activity within patient's tolerance;Monitored during session;Repositioned    Home Living Family/patient expects to be discharged to:: Private residence Living Arrangements: Spouse/significant other Available Help at Discharge: Family;Available 24 hours/day (Husband taking leave; daughter and sister) Type of Home: House Home Access: Ramped entrance     Home Layout: One level Home Equipment: Walker - 2 wheels;Cane - single point;Shower seat;Bedside commode;Hand held shower head;Wheelchair - manual      Prior Function Level of Independence: Independent with assistive device(s)         Comments: Uses RW or cane.  Drives     Hand Dominance        Extremity/Trunk Assessment   Upper Extremity Assessment: Overall WFL for tasks assessed           Lower Extremity Assessment: Generalized weakness         Communication   Communication: No difficulties  Cognition Arousal/Alertness: Awake/alert Behavior During Therapy: WFL for tasks assessed/performed Overall Cognitive Status: Within Functional Limits for tasks assessed                      General Comments General comments (skin integrity, edema, etc.): Wound Lt medial thigh  Exercises     Assessment/Plan    PT Assessment Patient needs continued PT services  PT Problem List Decreased strength;Decreased activity tolerance;Decreased balance;Decreased mobility;Decreased knowledge of use of DME;Cardiopulmonary status limiting activity;Pain;Decreased skin integrity          PT Treatment  Interventions DME instruction;Gait training;Functional mobility training;Therapeutic activities;Therapeutic exercise;Balance training;Patient/family education    PT Goals (Current goals can be found in the Care Plan section)  Acute Rehab PT Goals Patient Stated Goal: To return home PT Goal Formulation: With patient Time For Goal Achievement: 06/27/16 Potential to Achieve Goals: Good    Frequency Min 3X/week   Barriers to discharge Decreased caregiver support Husband works and patient alone.  Husband taking a leave from work.  Daughter and sister will assist as well.    Co-evaluation               End of Session Equipment Utilized During Treatment: Gait belt Activity Tolerance: Patient tolerated treatment well;Patient limited by fatigue Patient left: in bed;with call bell/phone within reach Nurse Communication: Mobility status (Recommend HHPT f/u)         Time: [id number] PT Time Calculation (min) (ACUTE ONLY): 17 min   Charges:   PT Evaluation $PT Eval Moderate Complexity: 1 Procedure     PT G CodesDespina Pole 07/16/16, 6:10 PM Carita Pian. Sanjuana Kava, [place] Pager [phone]

## 2016-06-20 NOTE — Plan of Care (Signed)
Problem: Pain Managment: Goal: General experience of comfort will improve Outcome: Progressing Discussed with patient about pain management and taking lower to higher degrees of pain relief depending on the type of pain with some teach back displayed

## 2016-06-20 NOTE — Plan of Care (Signed)
Problem: Education: Goal: Knowledge of  General Education information/materials will improve Outcome: Progressing Discussed with patient about pain management and getting up to use the bedside commode for toileting with some teach back displayed

## 2016-06-21 ENCOUNTER — Encounter (HOSPITAL_COMMUNITY): Payer: 59

## 2016-06-21 DIAGNOSIS — D649 Anemia, unspecified: Secondary | ICD-10-CM

## 2016-06-21 DIAGNOSIS — R079 Chest pain, unspecified: Secondary | ICD-10-CM

## 2016-06-21 DIAGNOSIS — E0590 Thyrotoxicosis, unspecified without thyrotoxic crisis or storm: Secondary | ICD-10-CM

## 2016-06-21 LAB — COMPREHENSIVE METABOLIC PANEL
ALK PHOS: 60 U/L (ref 38–126)
ALT: 16 U/L (ref 14–54)
ANION GAP: 5 (ref 5–15)
AST: 33 U/L (ref 15–41)
Albumin: 2.4 g/dL — ABNORMAL LOW (ref 3.5–5.0)
BILIRUBIN TOTAL: 0.6 mg/dL (ref 0.3–1.2)
BUN: 12 mg/dL (ref 6–20)
CALCIUM: 8.7 mg/dL — AB (ref 8.9–10.3)
CO2: 26 mmol/L (ref 22–32)
Chloride: 107 mmol/L (ref 101–111)
Creatinine, Ser: 0.82 mg/dL (ref 0.44–1.00)
Glucose, Bld: 97 mg/dL (ref 65–99)
Potassium: 4.7 mmol/L (ref 3.5–5.1)
SODIUM: 138 mmol/L (ref 135–145)
TOTAL PROTEIN: 5.5 g/dL — AB (ref 6.5–8.1)

## 2016-06-21 LAB — CBC
HCT: 32.2 % — ABNORMAL LOW (ref 36.0–46.0)
HEMOGLOBIN: 9.9 g/dL — AB (ref 12.0–15.0)
MCH: 27.3 pg (ref 26.0–34.0)
MCHC: 30.7 g/dL (ref 30.0–36.0)
MCV: 89 fL (ref 78.0–100.0)
PLATELETS: 262 10*3/uL (ref 150–400)
RBC: 3.62 MIL/uL — AB (ref 3.87–5.11)
RDW: 15.3 % (ref 11.5–15.5)
WBC: 7.2 10*3/uL (ref 4.0–10.5)

## 2016-06-21 LAB — PHOSPHORUS: PHOSPHORUS: 2.4 mg/dL — AB (ref 2.5–4.6)

## 2016-06-21 LAB — KAPPA/LAMBDA LIGHT CHAINS
KAPPA FREE LGHT CHN: 328.1 mg/L — AB (ref 3.3–19.4)
KAPPA, LAMDA LIGHT CHAIN RATIO: 22.47 — AB (ref 0.26–1.65)
Lambda free light chains: 14.6 mg/L (ref 5.7–26.3)

## 2016-06-21 LAB — MAGNESIUM: MAGNESIUM: 1.9 mg/dL (ref 1.7–2.4)

## 2016-06-21 MED ORDER — METOPROLOL TARTRATE 50 MG PO TABS
50.0000 mg | ORAL_TABLET | Freq: Three times a day (TID) | ORAL | Status: DC
  Administered 2016-06-21 – 2016-06-22 (×5): 50 mg via ORAL
  Filled 2016-06-21 (×5): qty 1

## 2016-06-21 MED ORDER — DILTIAZEM HCL-DEXTROSE 100-5 MG/100ML-% IV SOLN (PREMIX)
5.0000 mg/h | INTRAVENOUS | Status: DC
  Administered 2016-06-21 (×2): 5 mg/h via INTRAVENOUS
  Filled 2016-06-21 (×2): qty 100

## 2016-06-21 MED ORDER — DILTIAZEM HCL-DEXTROSE 100-5 MG/100ML-% IV SOLN (PREMIX): 5.0000 mg/h | INTRAVENOUS | Status: DC

## 2016-06-21 NOTE — Progress Notes (Signed)
[facility] TEAM 1 - Stepdown/ICU TEAM  [name]  [id number] DOB: 1936/04/14 DOA: 06/17/2016 PCP: [name], MD    Brief Narrative:  80 y.o. female with a history of prior SVT, chronic urinary incontinence with associated chronic wound involving the left medial thigh extending to the left gluteal fold, COPD, hypertension, hypothyroidism, chronic back pain on MS Contin, depression, dyslipidemia, MGUS, and polymyalgia rheumatica on chronic low-dose prednisone who c/o chest pain that was constant in nature with mild shortness of breath. In the ER she was found to be in atrial fibrillation with RVR at 150. The patient has unintentionally lost nearly 20 pounds since August 2017.   Subjective: The patient is resting comfortably in bed today.  She reports no further pain in her foot.  She denies chest pain or shortness of breath.  She does report poor appetite but also admits she does not like the food that is served here.  Assessment & Plan:  Chest pain  -resolved - troponin has normalized after very low peak at 0.07 - does not likely need ischemic w/u at this time   New-onset atrial fibrillation with RVR / History of SVT (supraventricular tachycardia)  -HR still not at goal - Cards has increased BB - will resume low dose cardizem gtt for now and follow  -Dehydration, hypokalemia, hypomagnesemia, and iatrogenic hyperthyroidism all likely contributing - the initial 3 have been corrected - the hyperthyroidism will take a bit more time to correct  -CHADVASC 4 - full dose Lovenox for now - plan to transition to oral after Cards eval  -Cards consulted   Pan sensitive E coli as well as Kleb UTI / history of chronic Urinary incontinence -has seen Urologist once but never followed back up  -not taking oxybutynin due to dizziness and dry mouth -narrowed abx tx to complete 7 full days of tx    Hypothyroidism -10/20 outpatient TSH 0.04 > PCP decreased Synthroid from 118 to 100 g daily -  too early for this to be reflected in f/u labs so current TSH/FT4 not helpful - hold Synthroid entirely for now and follow  Essential hypertension  -BP currently well controlled   COPD  -Currently asymptomatic   Chronic Open wound of left thigh / left gluteal fold -Ongoing issue and has proven difficult to treat - related to chronic and recurrent urinary incontinence - WOC following   Anemia -Baseline hemoglobin 11.6 - admit hemoglobin 8.9 - Fe studies most c/w malnutrition or ACD - B12 marginal at 291 - replaced B-12  Non-volitional weight loss -loss of about 16 pounds since August of this year (~110kg) - I suspect this is mostly accounted for by DH/fluid loss rather than true lean body mass   Filed Weights   06/19/16 0500 06/20/16 0400 06/21/16 [id number]  Weight: 102.5 kg (225 lb 15.5 oz) 102.3 kg (225 lb 9.6 oz) 101.5 kg (223 lb 11.2 oz)    Chronic back pain -Followed by Dr. Hardin Negus (Pain clinic) as an outpatient - well controlled presently   Depression -Continue Zoloft  PMR  -Continue low-dose chronic prednisone - followed by Dr. Estanislado Pandy  [facility]  -Oncology has seen in hospital   L third toe ischemia Suspicious for embolic event, possibly related to afib - remainder of foot is warm w/ 1+ DP pulse - 2nd toe looks benign though it is the one she says hurts - ABIs pending - allow toe to demarginate but for now appears that only tip of toe affected - exam improved  today     DVT prophylaxis: lovenox  Code Status: FULL CODE Family Communication: no family present at time of exam today  Disposition Plan: SDU  Consultants:  Heme/Onc  Procedures: none  Antimicrobials:  Levaquin 10/26 > 10/27 Keflex 10/28 >  Objective: Blood pressure 124/76, pulse 99, temperature 98.1 F (36.7 C), temperature source Oral, resp. rate 13, height 5\' 7"  (1.702 m), weight 101.5 kg (223 lb 11.2 oz), SpO2 93 %.  Intake/Output Summary (Last 24 hours) at 06/21/16 1417 Last data filed at  06/21/16 0300  Gross per 24 hour  Intake              720 ml  Output                0 ml  Net              720 ml   Filed Weights   06/19/16 0500 06/20/16 0400 06/21/16 0212  Weight: 102.5 kg (225 lb 15.5 oz) 102.3 kg (225 lb 9.6 oz) 101.5 kg (223 lb 11.2 oz)    Examination: General: No acute respiratory distress   Lungs: Clear to auscultation bilaterally without wheezing or crackles Cardiovascular: Irregularly irregular with heart rate at approximately 130 bpm Abdomen: Nontender, obese, soft, bowel sounds positive, without rebound Extremities: No significant edema bilateral lower extremities - purple discoloration of the very distal tip of the left third toe almost completely resolved   CBC:  Recent Labs Lab 06/17/16 0601 06/18/16 0138 06/19/16 0128 06/20/16 0216 06/21/16 0308  WBC 10.6* 8.5 8.1 6.6 7.2  NEUTROABS 8.3* 5.6  --   --   --   HGB 8.9* 9.4* 9.6* 9.5* 9.9*  HCT 28.7* 29.7* 30.8* 31.5* 32.2*  MCV 89.1 88.4 87.7 90.5 89.0  PLT 170 192 229 230 [id number]   Basic Metabolic Panel:  Recent Labs Lab 06/17/16 0305 06/17/16 1331 06/18/16 0138 06/19/16 0128 06/20/16 0216 06/21/16 0308  NA 136  --  139 139 139 138  K 3.5  --  3.1* 3.6 4.5 4.7  CL 100*  --  109 108 111 107  CO2 26  --  23 24 25 26   GLUCOSE 141*  --  88 109* 109* 97  BUN 12  --  9 11 13 12   CREATININE 0.84  --  0.64 0.76 0.64 0.82  CALCIUM 9.0  --  8.3* 8.5* 8.6* 8.7*  MG  --  1.5* 2.1 1.8 2.2 1.9  PHOS  --  2.9  --   --   --  2.4*   GFR: Estimated Creatinine Clearance: 69.3 mL/min (by C-G formula based on SCr of 0.82 mg/dL).  Liver Function Tests:  Recent Labs Lab 06/17/16 0305 06/19/16 0128 06/21/16 0308  AST 22 16 33  ALT 9* 10* 16  ALKPHOS 60 51 60  BILITOT 0.9 0.6 0.6  PROT 6.9 5.1* 5.5*  ALBUMIN 2.9* 2.1* 2.4*    Cardiac Enzymes:  Recent Labs Lab 06/17/16 1331 06/17/16 1955 06/18/16 0138 06/18/16 0647 06/18/16 1316  TROPONINI 0.04* 0.04* 0.05* 0.03* <0.03     HbA1C: Hgb A1c MFr Bld  Date/Time Value Ref Range Status  06/17/2016 07:45 AM 5.7 (H) 4.8 - 5.6 % Final    Comment:    (NOTE)         Pre-diabetes: 5.7 - 6.4         Diabetes: >6.4         Glycemic control for adults with diabetes: <7.0  Recent Results (from the past 240 hour(s))  Blood Culture (routine x 2)     Status: None (Preliminary result)   Collection Time: 06/17/16  3:05 AM  Result Value Ref Range Status   Specimen Description BLOOD LEFT ARM  Final   Special Requests BOTTLES DRAWN AEROBIC AND ANAEROBIC 5ML  Final   Culture NO GROWTH 3 DAYS  Final   Report Status PENDING  Incomplete  Blood Culture (routine x 2)     Status: None (Preliminary result)   Collection Time: 06/17/16  3:20 AM  Result Value Ref Range Status   Specimen Description BLOOD LEFT HAND  Final   Special Requests IN PEDIATRIC BOTTLE 3ML  Final   Culture NO GROWTH 3 DAYS  Final   Report Status PENDING  Incomplete  Urine culture     Status: Abnormal   Collection Time: 06/17/16  3:27 AM  Result Value Ref Range Status   Specimen Description URINE, CATHETERIZED  Final   Special Requests NONE  Final   Culture (A)  Final    >=100,000 COLONIES/mL KLEBSIELLA PNEUMONIAE >=100,000 COLONIES/mL ESCHERICHIA COLI    Report Status 06/19/2016 FINAL  Final   Organism ID, Bacteria KLEBSIELLA PNEUMONIAE (A)  Final   Organism ID, Bacteria ESCHERICHIA COLI (A)  Final      Susceptibility   Escherichia coli - MIC*    AMPICILLIN 4 SENSITIVE Sensitive     CEFAZOLIN <=4 SENSITIVE Sensitive     CEFTRIAXONE <=1 SENSITIVE Sensitive     CIPROFLOXACIN <=0.25 SENSITIVE Sensitive     GENTAMICIN <=1 SENSITIVE Sensitive     IMIPENEM <=0.25 SENSITIVE Sensitive     NITROFURANTOIN <=16 SENSITIVE Sensitive     TRIMETH/SULFA <=20 SENSITIVE Sensitive     AMPICILLIN/SULBACTAM <=2 SENSITIVE Sensitive     PIP/TAZO <=4 SENSITIVE Sensitive     Extended ESBL NEGATIVE Sensitive     * >=100,000 COLONIES/mL ESCHERICHIA COLI    Klebsiella pneumoniae - MIC*    AMPICILLIN 16 RESISTANT Resistant     CEFAZOLIN <=4 SENSITIVE Sensitive     CEFTRIAXONE <=1 SENSITIVE Sensitive     CIPROFLOXACIN <=0.25 SENSITIVE Sensitive     GENTAMICIN <=1 SENSITIVE Sensitive     IMIPENEM <=0.25 SENSITIVE Sensitive     NITROFURANTOIN <=16 SENSITIVE Sensitive     TRIMETH/SULFA <=20 SENSITIVE Sensitive     AMPICILLIN/SULBACTAM <=2 SENSITIVE Sensitive     PIP/TAZO <=4 SENSITIVE Sensitive     Extended ESBL NEGATIVE Sensitive     * >=100,000 COLONIES/mL KLEBSIELLA PNEUMONIAE  MRSA PCR Screening     Status: None   Collection Time: 06/17/16  5:36 PM  Result Value Ref Range Status   MRSA by PCR NEGATIVE NEGATIVE Final    Comment:        The GeneXpert MRSA Assay (FDA approved for NASAL specimens only), is one component of a comprehensive MRSA colonization surveillance program. It is not intended to diagnose MRSA infection nor to guide or monitor treatment for MRSA infections.      Scheduled Meds: . cephALEXin  500 mg Oral Q12H  . collagenase   Topical Daily  . enoxaparin (LOVENOX) injection  1 mg/kg Subcutaneous Q12H  . feeding supplement (ENSURE ENLIVE)  237 mL Oral BID BM  . metoprolol tartrate  50 mg Oral BID  . morphine  15 mg Oral Daily  . morphine  30 mg Oral QHS  . oxybutynin  5 mg Oral BID  . pantoprazole  40 mg Oral Daily  . predniSONE  2.5 mg  Oral Q breakfast  . sertraline  50 mg Oral Daily       LOS: 4 days   Cherene Altes, MD [facility] Office  [phone] Pager - Text Page per Amion as per below:  On-Call/Text Page:      [email or web address]      password [email or web address]  If 7PM-7AM, please contact night-coverage [email or web address] Password [email or web address] 06/21/2016, 2:17 PM

## 2016-06-21 NOTE — Evaluation (Signed)
Occupational Therapy Evaluation and Discharge Patient Details Name: [name] MRN: [id number] DOB: 08-Jul-1936 Today's Date: 06/21/2016    History of Present Illness Patient is a 80 yo female admitted 06/17/16 with chest pain, mild SOB.  Patient with Afib with RVR, UTI, 3rd toe ischemia.     PMH:  SVT, urinary incontinence, wound Lt medial thigh to buttocks, COPD, HTN, chronic back pain, polymyalgia rheumatica, depression   Clinical Impression   This 80 yo female admitted with above presents to acute OT at a min A level or better for basic ADLs except for toileting hygiene (due to left upper inner thigh wound and thus needs A for this to keep wound taken care of). Pt is to have 24 hour care by her family post D/C. No further OT needs, we will sign off with HHOT follow up recommended to address and basic ADL needs in pt's own environment.    Follow Up Recommendations  Home health OT;Supervision/Assistance - 24 hour    Equipment Recommendations  None recommended by OT       Precautions / Restrictions Precautions Precautions: Fall Precaution Comments: left inner thigh wound Restrictions Weight Bearing Restrictions: No      Mobility Bed Mobility Overal bed mobility: Modified Independent             General bed mobility comments: Increased time and use of rail and HOB up  Transfers Overall transfer level: Needs assistance Equipment used: Rolling walker (2 wheeled) Transfers: Sit to/from Stand Sit to Stand: Supervision                   ADL Overall ADL's : Needs assistance/impaired Eating/Feeding: Independent;Sitting   Grooming: Set up;Sitting   Upper Body Bathing: Set up;Sitting   Lower Body Bathing: Minimal assistance (S sit<>stand, A needed for LBB due to left upper inner thigh wound)   Upper Body Dressing : Set up;Sitting   Lower Body Dressing: Supervision/safety;Sit to/from stand   Toilet Transfer: Supervision/safety (side step up to head of  bed without AD)   Toileting- Clothing Manipulation and Hygiene: Maximal assistance (S sit<>stand)                         Pertinent Vitals/Pain Pain Assessment: 0-10 Pain Score: 5  Pain Location: left inner upper thigh wound Pain Descriptors / Indicators: Tender;Sore Pain Intervention(s): Monitored during session;Repositioned (reapplied foam dressing that was coming off)     Hand Dominance Right   Extremity/Trunk Assessment Upper Extremity Assessment Upper Extremity Assessment: Overall WFL for tasks assessed           Communication Communication Communication: No difficulties   Cognition Arousal/Alertness: Awake/alert Behavior During Therapy: WFL for tasks assessed/performed Overall Cognitive Status: Within Functional Limits for tasks assessed                                Home Living Family/patient expects to be discharged to:: Private residence Living Arrangements: Spouse/significant other Available Help at Discharge: Family;Available 24 hours/day (husband taking leave, dtr and sister) Type of Home: House Home Access: Ramped entrance     Home Layout: One level     Bathroom Shower/Tub: Occupational psychologist: Handicapped height     Home Equipment: Environmental consultant - 2 wheels;Cane - single point;Shower seat;Bedside commode;Hand held shower head;Wheelchair - manual          Prior Functioning/Environment Level of Independence: Independent  with assistive device(s)        Comments: Uses RW or cane.  Drives        OT Problem List: Impaired balance (sitting and/or standing);Obesity;Pain   OT Treatment/Interventions:      OT Goals(Current goals can be found in the care plan section) Acute Rehab OT Goals Patient Stated Goal: To return home and get this wound taken care of  OT Frequency:                End of Session Nurse Communication:  (I replaced foam dressing and was not sure if there was anything else being done about it?  RN Justice Rocher) said wound care nurse had looked at it again today.)  Activity Tolerance: Patient tolerated treatment well Patient left: in bed;with call bell/phone within reach   Time: 1321-1347 OT Time Calculation (min): 26 min Charges:  OT General Charges $OT Visit: 1 Procedure OT Evaluation $OT Eval Moderate Complexity: 1 Procedure OT Treatments $Self Care/Home Management : 8-22 mins  Almon Register [id number] 06/21/2016, 2:11 PM

## 2016-06-21 NOTE — Progress Notes (Signed)
PT Cancellation Note  Patient Details Name: [name] MRN: [id number] DOB: 16-Jan-1936   Cancelled Treatment:    Reason Eval/Treat Not Completed: Other (comment). Pt fatigued after OT.   Suanne Marker 06/21/2016, 3:21 PM [facility] PT [phone]

## 2016-06-21 NOTE — Consult Note (Signed)
Reason for Consult:   AF with RVR- A-flutter with 2:1, new onset  Requesting Physician: Dr Thereasa Solo Primary Cardiologist Dr Martinique  HPI:   80 y/o obese caucasian female with a history of remote PSVT, PMR on chronic steroids, HTN, HLD with statin intol, anemia, MGUS, and hypothyroidism, followed by Dr Glori Bickers and Dr Martinique. Echo Feb 2017 showed an EF of 55-60% with normal LA size. She had recently been noted to have a TSH of 130 in Aug. She was placed on levothyroxine. This was decreased recently from 0.118 mg to 0.1mg   secondary to a TSH of 0.04. She admits she had lost 20 lbs since Aug. The pt presented to the ED at [facility] with palpitations 06/17/16 and was noted to be in atrial flutter with RVR-160. She was admitted and started on Diltiazem for rate control and full dose Lovenox (CHADs VASc=4 for age, HTN, and sex). TSH is now 0.019, T4 1.82. Her HR remains difficult to control though she is less symptomatic.   PMHx:  Past Medical History:  Diagnosis Date  . Chronic pain    Managed by Dr. Hardin Negus  . Diverticulitis   . Hyperlipidemia   . Hypertension   . IBS (irritable bowel syndrome)   . Palpitations   . Polymyalgia rheumatica ([place])   . PVC's (premature ventricular contractions)   . SVT (supraventricular tachycardia) ([place])   . Tobacco dependence     Past Surgical History:  Procedure Laterality Date  . NASAL SINUS SURGERY    . REPLACEMENT TOTAL KNEE    . TONSILLECTOMY    . WRIST FRACTURE SURGERY      SOCHx:  reports that she quit smoking about 4 years ago. She smoked 0.50 packs per day. She has never used smokeless tobacco. She reports that she does not drink alcohol or use drugs.Lives with her husband. Activity limited, she uses a walker or cane to ambulate.   FAMHx: Family History  Problem Relation Age of Onset  . Diabetes Mother   . Heart attack Father   . Heart disease Sister     ALLERGIES: Allergies  Allergen Reactions  . Amoxicillin-Pot Clavulanate       REACTION: diarrhea  . Aspirin Nausea And Vomiting  . Atorvastatin     REACTION: feel weak  . Carisoprodol     REACTION: intolerant  . Codeine     REACTION: hallucinations  . Codeine Phosphate     REACTION: UNKNOWN  . Cortisone     REACTION: pt. reports allergy, reaction not known  . Duoderm Hydroactive     Skin irritation and pain     . Ezetimibe-Simvastatin     REACTION: feels weak  . Naproxen Sodium     REACTION: GI symptoms  . Omeprazole     REACTION: abd pain  . Quinapril Hcl     REACTION: reports allergy, reaction not known  . Rofecoxib     REACTION: reports allergy, reaction not known  . Silvadene [Silver Sulfadiazine]     Skin irritation and pain     ROS: Review of Systems: General: negative for chills, fever, night sweats or weight changes.  Cardiovascular: negative for edema, orthopnea, paroxysmal nocturnal dyspnea or shortness of breath HEENT: negative for any visual disturbances, blindness, glaucoma Dermatological: negative for rash Respiratory: negative for cough, hemoptysis, or wheezing Urologic: negative for hematuria or dysuria Abdominal: negative for nausea, vomiting, diarrhea, bright red blood per rectum, melena, or hematemesis Neurologic: negative for visual changes,  syncope, or dizziness Musculoskeletal: negative for back pain, joint pain, or swelling Psych: cooperative and appropriate All other systems reviewed and are otherwise negative except as noted above.   HOME MEDICATIONS: Prior to Admission medications   Medication Sig Start Date End Date Taking? Authorizing Provider  levothyroxine (SYNTHROID, LEVOTHROID) 100 MCG tablet Take 1 tablet (100 mcg total) by mouth daily. 05/28/16  Yes Abner Greenspan, MD  meclizine (ANTIVERT) 25 MG tablet TAKE 1 TABLET (25 MG TOTAL) BY MOUTH 3 (THREE) TIMES DAILY AS NEEDED FOR DIZZINESS. 03/14/15  Yes Abner Greenspan, MD  metoprolol tartrate (LOPRESSOR) 25 MG tablet TAKE 1 TABLET (25 MG TOTAL) BY MOUTH 2 (TWO) TIMES  DAILY. 06/14/16  Yes Abner Greenspan, MD  morphine (MS CONTIN) 15 MG 12 hr tablet Take 1 tablet by mouth daily. 01/02/16  Yes Historical Provider, MD  morphine (MS CONTIN) 30 MG 12 hr tablet Take 1 tablet by mouth daily. 01/02/16  Yes Historical Provider, MD  oxybutynin (DITROPAN) 5 MG tablet Take 1 tablet (5 mg total) by mouth 2 (two) times daily. 06/11/16  Yes Maxwell, MD  pantoprazole (PROTONIX) 40 MG tablet TAKE 1 TABLET (40 MG TOTAL) BY MOUTH DAILY. 08/26/15  Yes Abner Greenspan, MD  predniSONE (DELTASONE) 5 MG tablet Take 2mg  daily. 08/06/15  Yes Historical Provider, MD  PRESCRIPTION MEDICATION Apply 1 application topically 3 (three) times daily. Triam Silver   Yes Historical Provider, MD  sertraline (ZOLOFT) 50 MG tablet TAKE 1 TABLET BY MOUTH EVERY DAY 03/08/16  Yes Abner Greenspan, MD  spironolactone (ALDACTONE) 25 MG tablet TAKE 1 TABLET (25 MG TOTAL) BY MOUTH DAILY. 05/03/16  Yes Abner Greenspan, MD  trolamine salicylate (ASPERCREME) 10 % cream Apply 1 application topically 2 (two) times daily as needed for muscle pain.   Yes Historical Provider, MD    HOSPITAL MEDICATIONS: I have reviewed the patient's current medications.  VITALS: Blood pressure 124/76, pulse 99, temperature 98.1 F (36.7 C), temperature source Oral, resp. rate 13, height 5\' 7"  (1.702 m), weight 223 lb 11.2 oz (101.5 kg), SpO2 93 %.  PHYSICAL EXAM: General appearance: alert, cooperative, no distress, morbidly obese and pale Neck: no carotid bruit, no JVD and thyroid not enlarged, symmetric, no tenderness/mass/nodules Lungs: clear to auscultation bilaterally Heart: irregularly irregular rhythm Abdomen: obese, non tender Extremities: extremities normal, atraumatic, no cyanosis or edema Skin: pale cool dry Neurologic: Grossly normal  LABS: Results for orders placed or performed during the hospital encounter of 06/17/16 (from the past 24 hour(s))  Comprehensive metabolic panel     Status: Abnormal   Collection Time:  06/21/16  3:08 AM  Result Value Ref Range   Sodium 138 135 - 145 mmol/L   Potassium 4.7 3.5 - 5.1 mmol/L   Chloride 107 101 - 111 mmol/L   CO2 26 22 - 32 mmol/L   Glucose, Bld 97 65 - 99 mg/dL   BUN 12 6 - 20 mg/dL   Creatinine, Ser 0.82 0.44 - 1.00 mg/dL   Calcium 8.7 (L) 8.9 - 10.3 mg/dL   Total Protein 5.5 (L) 6.5 - 8.1 g/dL   Albumin 2.4 (L) 3.5 - 5.0 g/dL   AST 33 15 - 41 U/L   ALT 16 14 - 54 U/L   Alkaline Phosphatase 60 38 - 126 U/L   Total Bilirubin 0.6 0.3 - 1.2 mg/dL   GFR calc non Af Amer >60 >60 mL/min   GFR calc Af Amer >60 >60 mL/min  Anion gap 5 5 - 15  CBC     Status: Abnormal   Collection Time: 06/21/16  3:08 AM  Result Value Ref Range   WBC 7.2 4.0 - 10.5 K/uL   RBC 3.62 (L) 3.87 - 5.11 MIL/uL   Hemoglobin 9.9 (L) 12.0 - 15.0 g/dL   HCT 32.2 (L) 36.0 - 46.0 %   MCV 89.0 78.0 - 100.0 fL   MCH 27.3 26.0 - 34.0 pg   MCHC 30.7 30.0 - 36.0 g/dL   RDW 15.3 11.5 - 15.5 %   Platelets 262 150 - 400 K/uL  Magnesium     Status: None   Collection Time: 06/21/16  3:08 AM  Result Value Ref Range   Magnesium 1.9 1.7 - 2.4 mg/dL  Phosphorus     Status: Abnormal   Collection Time: 06/21/16  3:08 AM  Result Value Ref Range   Phosphorus 2.4 (L) 2.5 - 4.6 mg/dL    EKG: Earlier today AF with VR 80s, currently A-flutter with VR 45.   IMAGING: PCXR 06/17/16 IMPRESSION: 1. Stable mild cardiomegaly. 2. Diffuse interstitial changes, chronic. Cannot rule out tiny left effusion. 3. Atherosclerosis of the aorta   IMPRESSION: Principal Problem:   Chest pain Active Problems:   Hyperthyroidism   New onset atrial fibrillation (HCC)   Essential hypertension   COPD (chronic obstructive pulmonary disease) (HCC)   PMR (polymyalgia rheumatica) (HCC)   SIRS (systemic inflammatory response syndrome) (HCC)   Anemia   HYPERCHOLESTEROLEMIA   Obesity   History of SVT (supraventricular tachycardia) (HCC)   Chronic back pain   Depression   UTI (urinary tract infection)    MGUS (monoclonal gammopathy of unknown significance)   Open wound of left thigh/chronic wound left gluteal fold   Urinary incontinence   Pressure injury of skin   RECOMMENDATION: Will review with MD- Lopressor was increased from 25 mg BID to 50 mg BID, will increase further to 50 mg Q8.   I suspect her hyperthyroidism will need to be under better control before considering TEE/CV.   She will need NOAC pre and post post cardioversion, but not necessarily long term as she has a revserable metabolic condition that is most likely contributing to her AF.   Time Spent Directly with Patient: 50 minutes  Kerin Ransom, [place]ri[place] beeper 06/21/2016, 2:21 PM    Patient seen and examined. Agree with assessment and plan. Ms. Finnegan Litzinger is a 80 year old renal superventricular tachycardia, COPD, hypertension, polymyalgia rheumatica, manic on low-dose prednisone, MGUS, hyperlipidemia, and urinary incontinence. From a cardiac perspective she is followed by Dr. Peter Martinique.   An echo Doppler study in February 2017 showed an ejection fraction of 55-60%.  Left atrial size was normal. In August she was found to be significantly hypothyroid and was started on Synthroid replacement and was most recently receiving 118 g daily. She was admitted on 06/17/16 with mild shortness of breath and vague chest pain was found to be in atrial fibrillation with rapid ventricular rate at 150 bpm.  ECG from that day shows atrial fibrillation at 163 bpm.   She recently was found to have an over suppressed TSH and her dose of Synthroid replacement has been reduced to 100 g.  She has lost somewhere between 15-20 pounds since August.  She had been started on IV Cardizem and currently is at 5 mg per hour and has been receiving Lovenox at full dose for anticoagulation. Beta blocker was started and had been titrated to 50 mg  bid.  A follow-up ECG yesterday showed atrial fibrillation at a rate of 88 bpm.  Her heart rate today is  still increased with rates ranging from 98-140 bpm. Chest x-ray has revealed chronic interstitial changes in stable mild cardiomegaly.  Agree with further titration of Lopressor to 50 mg every 8 hours today.  If her ventricular rate stabilizes, it may be possible to discontinue IV Cardizem. Initial troponin was minimally positive at 0.05 with follow-up troponin negative, suggesting rate related demand ischemia. Her thyroid replacement has been on hold and I anticipate she may require further dose reduction from 100 g upon reinstitution. At present, she is on Lovenox for anticoagulation, but can be transitioned to NOAC therapy for oral anticoagulation.  Troy Sine, MD, [facility] 06/21/2016 4:26 PM

## 2016-06-22 ENCOUNTER — Telehealth: Payer: Self-pay

## 2016-06-22 ENCOUNTER — Other Ambulatory Visit: Payer: 59

## 2016-06-22 ENCOUNTER — Ambulatory Visit: Payer: 59 | Admitting: Hematology

## 2016-06-22 DIAGNOSIS — I471 Supraventricular tachycardia: Secondary | ICD-10-CM

## 2016-06-22 DIAGNOSIS — F321 Major depressive disorder, single episode, moderate: Secondary | ICD-10-CM

## 2016-06-22 DIAGNOSIS — A419 Sepsis, unspecified organism: Principal | ICD-10-CM

## 2016-06-22 DIAGNOSIS — E6609 Other obesity due to excess calories: Secondary | ICD-10-CM

## 2016-06-22 DIAGNOSIS — N390 Urinary tract infection, site not specified: Secondary | ICD-10-CM

## 2016-06-22 DIAGNOSIS — J438 Other emphysema: Secondary | ICD-10-CM

## 2016-06-22 DIAGNOSIS — S71102A Unspecified open wound, left thigh, initial encounter: Secondary | ICD-10-CM

## 2016-06-22 DIAGNOSIS — B961 Klebsiella pneumoniae [K. pneumoniae] as the cause of diseases classified elsewhere: Secondary | ICD-10-CM

## 2016-06-22 LAB — CULTURE, BLOOD (ROUTINE X 2)
CULTURE: NO GROWTH
Culture: NO GROWTH

## 2016-06-22 MED ORDER — RIVAROXABAN 20 MG PO TABS
20.0000 mg | ORAL_TABLET | Freq: Every day | ORAL | Status: DC
  Administered 2016-06-22 – 2016-06-23 (×2): 20 mg via ORAL
  Filled 2016-06-22 (×2): qty 1

## 2016-06-22 NOTE — Progress Notes (Addendum)
Subjective:  Feels better  Objective:   Vital Signs : Vitals:   06/22/16 0108 06/22/16 0444 06/22/16 0500 06/22/16 0721  BP: 111/76 123/77  (!) 96/55  Pulse: 72 63    Resp: 17 17    Temp:  97.6 F (36.4 C)  97.9 F (36.6 C)  TempSrc:  Axillary  Axillary  SpO2: 92% 98%    Weight:   222 lb (100.7 kg)   Height:        Intake/Output from previous day:  Intake/Output Summary (Last 24 hours) at 06/22/16 1007 Last data filed at 06/21/16 1900  Gross per 24 hour  Intake              480 ml  Output                0 ml  Net              480 ml    I/O since admission: +2340  Wt Readings from Last 3 Encounters:  06/22/16 222 lb (100.7 kg)  06/11/16 223 lb 8 oz (101.4 kg)  04/02/16 242 lb 12 oz (110.1 kg)    Medications: . cephALEXin  500 mg Oral Q12H  . collagenase   Topical Daily  . enoxaparin (LOVENOX) injection  1 mg/kg Subcutaneous Q12H  . feeding supplement (ENSURE ENLIVE)  237 mL Oral BID BM  . metoprolol tartrate  50 mg Oral TID  . morphine  15 mg Oral Daily  . morphine  30 mg Oral QHS  . oxybutynin  5 mg Oral BID  . pantoprazole  40 mg Oral Daily  . predniSONE  2.5 mg Oral Q breakfast  . sertraline  50 mg Oral Daily       Physical Exam:   General appearance: alert, cooperative and mildly obese Neck: no adenopathy, no carotid bruit, no JVD, supple, symmetrical, trachea midline and thyroid not enlarged, symmetric, no tenderness/mass/nodules Lungs: clear to auscultation bilaterally Heart: regular rate and rhythm; 1/6 sem; no s3 Abdomen: soft, non-tender; bowel sounds normal; no masses,  no organomegaly Extremities: no edema, redness or tenderness in the calves or thighs Neurologic: Grossly normal   Rate: 88  Rhythm: normal sinus rhythm  ECG (independently read by me):  Converted to NSR at 70 from prior tracing.   Lab Results:   Recent Labs  06/20/16 0216 06/21/16 0308  NA 139 138  K 4.5 4.7  CL 111 107  CO2 25 26  GLUCOSE 109* 97  BUN 13 12   CREATININE 0.64 0.82  CALCIUM 8.6* 8.7*  MG 2.2 1.9  PHOS  --  2.4*    Hepatic Function Latest Ref Rng & Units 06/21/2016 06/19/2016 06/17/2016  Total Protein 6.5 - 8.1 g/dL 5.5(L) 5.1(L) 6.9  Albumin 3.5 - 5.0 g/dL 2.4(L) 2.1(L) 2.9(L)  AST 15 - 41 U/L 33 16 22  ALT 14 - 54 U/L 16 10(L) 9(L)  Alk Phosphatase 38 - 126 U/L 60 51 60  Total Bilirubin 0.3 - 1.2 mg/dL 0.6 0.6 0.9  Bilirubin, Direct 0.1 - 0.5 mg/dL - - 0.3     Recent Labs  06/20/16 0216 06/21/16 0308  WBC 6.6 7.2  HGB 9.5* 9.9*  HCT 31.5* 32.2*  MCV 90.5 89.0  PLT 230 262    No results for input(s): TROPONINI in the last 72 hours.  Invalid input(s): CK, MB  Lab Results  Component Value Date   TSH 0.019 (L) 06/17/2016   No results for input(s): HGBA1C in  the last 72 hours.   Recent Labs  06/21/16 0308  PROT 5.5*  ALBUMIN 2.4*  AST 33  ALT 16  ALKPHOS 60  BILITOT 0.6   No results for input(s): INR in the last 72 hours. BNP (last 3 results)  Recent Labs  09/22/15 0952  BNP 130.2*    ProBNP (last 3 results)  Recent Labs  09/11/15 1200  PROBNP 326.0*     Lipid Panel     Component Value Date/Time   CHOL 112 06/18/2016 0138   TRIG 56 06/18/2016 0138   HDL 38 (L) 06/18/2016 0138   CHOLHDL 2.9 06/18/2016 0138   VLDL 11 06/18/2016 0138   LDLCALC 63 06/18/2016 0138   LDLDIRECT 136.4 05/24/2012 0954      Imaging:  No results found.    Assessment/Plan:   Principal Problem:   Chest pain Active Problems:   Hyperthyroidism   HYPERCHOLESTEROLEMIA   Obesity   Essential hypertension   History of SVT (supraventricular tachycardia) (HCC)   COPD (chronic obstructive pulmonary disease) (HCC)   Chronic back pain   Depression   UTI (urinary tract infection)   PMR (polymyalgia rheumatica) (HCC)   MGUS (monoclonal gammopathy of unknown significance)   Open wound of left thigh/chronic wound left gluteal fold   Urinary incontinence   SIRS (systemic inflammatory response syndrome)  (HCC)   Atrial fibrillation with rapid ventricular response (HCC)   Anemia   Pressure injury of skin   1. PAF: converted to NSR with metoprolol 50 mg tid; consider transition to 75 mg bid for med simplification. 2. H/o SVT 3. Essential HTN 4. Oversuppressed TSH on thryroid replacement; currently on hold, but may need to resume at lower dose than 100 ug 5. Anticoagulation on full dose lovenox;  Consider transition to NOAC with eliquis. 6. Anemia   Troy Sine, MD, [facility] 06/22/2016, 10:07 AM

## 2016-06-22 NOTE — Telephone Encounter (Signed)
I spoke to the patient. I advised her to have her hospitalist order a consult for urology.

## 2016-06-22 NOTE — Progress Notes (Signed)
Per [facility] insurance check:  "preferred is Xarelto, copay $63.00 for 30-day supply.

## 2016-06-22 NOTE — Progress Notes (Signed)
Physical Therapy Treatment Patient Details Name: [name] MRN: [id number] DOB: 02-Aug-1936 Today's Date: 06/22/2016    History of Present Illness Patient is a 80 yo female admitted 06/17/16 with chest pain, mild SOB.  Patient with Afib with RVR, UTI, 3rd toe ischemia.     PMH:  SVT, urinary incontinence, wound Lt medial thigh to buttocks, COPD, HTN, chronic back pain, polymyalgia rheumatica, depression    PT Comments    Patient progressing with ambulation and tolerance to activity.  Still with weakness evident in trunk with postural deficits and relates history of back pain.    Follow Up Recommendations  Home health PT;Supervision for mobility/OOB     Equipment Recommendations  None recommended by PT    Recommendations for Other Services       Precautions / Restrictions Precautions Precautions: Fall Precaution Comments: left inner thigh wound    Mobility  Bed Mobility Overal bed mobility: Needs Assistance Bed Mobility: Sit to Supine       Sit to supine: HOB elevated;Min assist   General bed mobility comments: increased time, cues for positioning, assist for lifting trunk to allow pt to scoot up in bed  Transfers Overall transfer level: Needs assistance Equipment used: Rolling walker (2 wheeled) Transfers: Sit to/from Stand Sit to Stand: Supervision         General transfer comment: cues for safety, hand placement  Ambulation/Gait Ambulation/Gait assistance: Min guard Ambulation Distance (Feet): 140 Feet Assistive device: Rolling walker (2 wheeled) Gait Pattern/deviations: Step-through pattern;Shuffle;Decreased stride length;Wide base of support;Trunk flexed     General Gait Details: cues for posture though short lived Public librarian Rankin (Stroke Patients Only)       Balance Overall balance assessment: Needs assistance   Sitting balance-Leahy Scale: Good     Standing balance  support: Bilateral upper extremity supported Standing balance-Leahy Scale: Poor Standing balance comment: UE support needed for balance                    Cognition Arousal/Alertness: Awake/alert Behavior During Therapy: WFL for tasks assessed/performed Overall Cognitive Status: Within Functional Limits for tasks assessed                      Exercises      General Comments General comments (skin integrity, edema, etc.): wound medial L thigh      Pertinent Vitals/Pain Pain Assessment: Faces Faces Pain Scale: Hurts even more Pain Location: chronic back pain Pain Descriptors / Indicators: Aching Pain Intervention(s): Monitored during session;Repositioned    Home Living                      Prior Function            PT Goals (current goals can now be found in the care plan section) Progress towards PT goals: Progressing toward goals    Frequency    Min 3X/week      PT Plan Current plan remains appropriate    Co-evaluation             End of Session Equipment Utilized During Treatment: Gait belt Activity Tolerance: Patient tolerated treatment well Patient left: in bed;with call bell/phone within reach     Time: 1042-1105 PT Time Calculation (min) (ACUTE ONLY): 23 min  Charges:  $Gait Training: 23-37 mins  G CodesReginia Naas 06/22/2016, 1:54 PM  Magda Kiel, [place] 06/22/2016

## 2016-06-22 NOTE — Progress Notes (Signed)
PROGRESS NOTE    [name]  [id number] DOB: October 07, 1935 DOA: 06/17/2016 PCP: [name], MD   Brief Narrative:  80 y.o. WF PMHx Depression, SVT, Chronic Urinary Incontinence with associated Chronic Wound involving the Left Medial Thigh extending to the left gluteal fold, COPD and former Tobacco use, HTN, Hypothyroidism, Chronic Back pain on MS Contin, Dyslipidemia, MGUS, Polymyalgia Rheumatica on chronic low-dose prednisone.   Patient reports that around 7 PM last night she began having chest pain that was constant in nature with mild shortness of breath. She went to bed and slept for a few hours in which he got up to go to the bathroom she was still having the chest pain. At no time did she have any awareness of palpitations. Because of the chest pain she reported to the ER where she was found to be in atrial fibrillation with RVR with ventricular response of 150+ beats per minute. She has not had any fevers or chills at home. She has no history of recurrent UTI. She is reporting urinary frequency and increased episodes of urinary incontinence past her baseline. In review of her outpatient records patient has lost nearly 20 pounds since August. She reports she has not made any attempts to lose weight. She is currently chest pain-free. In the ER patient was found to be febrile with a rectal temperature greater than 101 and given her abnormal urinalysis she was initially treated as a code sepsis.   Subjective: 10/31 24 hours afebrile. A/O 4, NAD. States not sleeping well at night. Patient currently asleep during daytime.   Assessment & Plan:   Principal Problem:   Chest pain Active Problems:   Hyperthyroidism   HYPERCHOLESTEROLEMIA   Obesity   Essential hypertension   History of SVT (supraventricular tachycardia) (HCC)   COPD (chronic obstructive pulmonary disease) (HCC)   Chronic back pain   Depression   UTI (urinary tract infection)   PMR (polymyalgia rheumatica) (HCC)  MGUS (monoclonal gammopathy of unknown significance)   Open wound of left thigh/chronic wound left gluteal fold   Urinary incontinence   SIRS (systemic inflammatory response syndrome) (HCC)   Atrial fibrillation with rapid ventricular response (HCC)   Anemia   Pressure injury of skin   Sepsis secondary to UTI (HCC)   Klebsiella pneumoniae infection   Escherichia coli infection   Chest pain  -resolved  - troponin has normalized after very low peak at 0.07  - does not likely need ischemic w/u at this time   New-onset atrial fibrillation with RVR / History of SVT (supraventricular tachycardia) -CHADVASC 4  -Currently in NSR -Metoprolol 50 mgTID per cardiology -Full dose Lovenox---> Xarelto starting 10/31 per pharmacy - Per cardiology Thyroid abnormality will need to be better controlled before considering TEE/CV  Pan sensitive positive KLEBSIELLA PNEUMONIAE/ ESCHERICHIA COLI UTI / history of chronic Urinary incontinence -has seen Urologist once but never followed back up  -not taking oxybutynin due to dizziness and dry mouth -narrowed abx tx to complete 7 full days of tx    Hypothyroidism -10/26 TSH/free T4 would be consistent with Hyperthyroidism. 10/20 outpatient TSH 0.04 > PCP decreased Synthroid from 118 to 100 g daily, therefore too early for this to be reflected in f/u labs so current TSH/FT4 not helpful  - hold Synthroid entirely for now and follow  Essential hypertension  -BP currently well controlled   COPD  -Currently asymptomatic   Chronic Open wound of left thigh /left gluteal fold -Ongoing issue and has proven difficult to  treat  - related to chronic and recurrent urinary incontinence  - WOC following   Anemia (Baseline Hemoglobin 11.6) - admit hemoglobin 8.9  - Fe studies most c/w malnutrition or ACD  - B12 marginal at 291 - replaced B-12  Non-volitional weight loss -loss of about 16 pounds since August of this year (~110kg) - I suspect this is  mostly accounted for by DH/fluid loss rather than true lean body mass   Chronic back pain -Followed by Dr. Hardin Negus (Pain clinic)as an outpatient - well controlled presently   Depression -Continue Zoloft 50 mg daily  PMR  -Continue low-dose chronic prednisone 2.5 mg daily  - followed by Dr. Estanislado Pandy  MGUS  -Oncology has seen in hospital. No treatment required.  L third toe ischemia -Suspicious for embolic event, possibly related to afib, remainder of foot is warm w/ 1+ DP pulse  -2nd toe looks benign though it is the one she says hurts -ABIs pending - allow toe to demarginate but for now appears that only tip of toe affected - exam improved today          DVT prophylaxis: Lovenox--> Xarelto Code Status: Full Family Communication:  Disposition Plan: Home   Consultants:  [facility] Dr.Gustav C Magrinat Oncology    Procedures/Significant Events:  10/26 ultrasound LLE: Negative abscess   Cultures 10/26 blood left arm/hand NGTD 10/26 urine positive KLEBSIELLA PNEUMONIAE/ ESCHERICHIA COLI  10/6 MRSA by PCR negative        Antimicrobials: Levaquin 10/26 > 10/27 Keflex 10/28    Devices None   LINES / TUBES:  None    Continuous Infusions:    Objective: Vitals:   06/22/16 0108 06/22/16 0444 06/22/16 0500 06/22/16 0721  BP: 111/76 123/77  (!) 96/55  Pulse: 72 63    Resp: 17 17    Temp:  97.6 F (36.4 C)  97.9 F (36.6 C)  TempSrc:  Axillary  Axillary  SpO2: 92% 98%    Weight:   100.7 kg (222 lb)   Height:        Intake/Output Summary (Last 24 hours) at 06/22/16 1205 Last data filed at 06/21/16 1900  Gross per 24 hour  Intake              480 ml  Output                0 ml  Net              480 ml   Filed Weights   06/20/16 0400 06/21/16 0212 06/22/16 0500  Weight: 102.3 kg (225 lb 9.6 oz) 101.5 kg (223 lb 11.2 oz) 100.7 kg (222 lb)    Examination:  General: A/O 4, NAD, No acute respiratory distress Eyes:  negative scleral hemorrhage, negative anisocoria, negative icterus ENT: Negative Runny nose, negative gingival bleeding, Neck:  Negative scars, masses, torticollis, lymphadenopathy, JVD Lungs: Clear to auscultation bilaterally without wheezes or crackles Cardiovascular: Regular rate and rhythm without murmur gallop or rub normal S1 and S2 Abdomen: Morbidly obese, negative abdominal pain, nondistended, positive soft, bowel sounds, no rebound, no ascites, no appreciable mass Extremities: No significant cyanosis, clubbing. Left third metatarsal purplish color secondary to ischemia?  Skin: Negative rashes, lesions, ulcers Psychiatric:  Negative depression, negative anxiety, negative fatigue, negative mania  Central nervous system:  Cranial nerves II through XII intact, tongue/uvula midline, all extremities muscle strength 5/5, sensation intact throughout,  negative dysarthria, negative expressive aphasia, negative receptive aphasia.  [name]  Data Reviewed: Care during the described time interval was provided by me .  I have reviewed this patient's available data, including medical history, events of note, physical examination, and all test results as part of my evaluation. I have personally reviewed and interpreted all radiology studies.  CBC:  Recent Labs Lab 06/17/16 0601 06/18/16 0138 06/19/16 0128 06/20/16 0216 06/21/16 0308  WBC 10.6* 8.5 8.1 6.6 7.2  NEUTROABS 8.3* 5.6  --   --   --   HGB 8.9* 9.4* 9.6* 9.5* 9.9*  HCT 28.7* 29.7* 30.8* 31.5* 32.2*  MCV 89.1 88.4 87.7 90.5 89.0  PLT 170 192 229 230 [id number]   Basic Metabolic Panel:  Recent Labs Lab 06/17/16 0305 06/17/16 1331 06/18/16 0138 06/19/16 0128 06/20/16 0216 06/21/16 0308  NA 136  --  139 139 139 138  K 3.5  --  3.1* 3.6 4.5 4.7  CL 100*  --  109 108 111 107  CO2 26  --  23 24 25 26   GLUCOSE 141*  --  88 109* 109* 97  BUN 12  --  9 11 13 12   CREATININE 0.84  --  0.64 0.76 0.64 0.82  CALCIUM 9.0  --  8.3* 8.5* 8.6*  8.7*  MG  --  1.5* 2.1 1.8 2.2 1.9  PHOS  --  2.9  --   --   --  2.4*   GFR: Estimated Creatinine Clearance: 68.9 mL/min (by C-G formula based on SCr of 0.82 mg/dL). Liver Function Tests:  Recent Labs Lab 06/17/16 0305 06/19/16 0128 06/21/16 0308  AST 22 16 33  ALT 9* 10* 16  ALKPHOS 60 51 60  BILITOT 0.9 0.6 0.6  PROT 6.9 5.1* 5.5*  ALBUMIN 2.9* 2.1* 2.4*   No results for input(s): LIPASE, AMYLASE in the last 168 hours. No results for input(s): AMMONIA in the last 168 hours. Coagulation Profile: No results for input(s): INR, PROTIME in the last 168 hours. Cardiac Enzymes:  Recent Labs Lab 06/17/16 1331 06/17/16 1955 06/18/16 0138 06/18/16 0647 06/18/16 1316  TROPONINI 0.04* 0.04* 0.05* 0.03* <0.03   BNP (last 3 results)  Recent Labs  09/11/15 1200  PROBNP 326.0*   HbA1C: No results for input(s): HGBA1C in the last 72 hours. CBG: No results for input(s): GLUCAP in the last 168 hours. Lipid Profile: No results for input(s): CHOL, HDL, LDLCALC, TRIG, CHOLHDL, LDLDIRECT in the last 72 hours. Thyroid Function Tests: No results for input(s): TSH, T4TOTAL, FREET4, T3FREE, THYROIDAB in the last 72 hours. Anemia Panel: No results for input(s): VITAMINB12, FOLATE, FERRITIN, TIBC, IRON, RETICCTPCT in the last 72 hours. Urine analysis:    Component Value Date/Time   COLORURINE AMBER (A) 06/17/2016 0327   APPEARANCEUR HAZY (A) 06/17/2016 0327   LABSPEC 1.027 06/17/2016 0327   LABSPEC 1.025 07/22/2015 1216   PHURINE 6.0 06/17/2016 0327   GLUCOSEU NEGATIVE 06/17/2016 0327   GLUCOSEU Negative 07/22/2015 1216   HGBUR MODERATE (A) 06/17/2016 0327   HGBUR trace-lysed 12/27/2006 1459   BILIRUBINUR SMALL (A) 06/17/2016 0327   BILIRUBINUR Negative 07/22/2015 1216   KETONESUR 40 (A) 06/17/2016 0327   PROTEINUR 100 (A) 06/17/2016 0327   UROBILINOGEN 0.2 07/22/2015 1216   NITRITE POSITIVE (A) 06/17/2016 0327   LEUKOCYTESUR MODERATE (A) 06/17/2016 0327   LEUKOCYTESUR  Negative 07/22/2015 1216   Sepsis Labs: @LABRCNTIP (procalcitonin:4,lacticidven:4)  ) Recent Results (from the past 240 hour(s))  Blood Culture (routine x 2)     Status: None (Preliminary result)   Collection Time: 06/17/16  3:05 AM  Result Value Ref Range Status   Specimen Description BLOOD LEFT ARM  Final   Special Requests BOTTLES DRAWN AEROBIC AND ANAEROBIC 5ML  Final   Culture NO GROWTH 4 DAYS  Final   Report Status PENDING  Incomplete  Blood Culture (routine x 2)     Status: None (Preliminary result)   Collection Time: 06/17/16  3:20 AM  Result Value Ref Range Status   Specimen Description BLOOD LEFT HAND  Final   Special Requests IN PEDIATRIC BOTTLE 3ML  Final   Culture NO GROWTH 4 DAYS  Final   Report Status PENDING  Incomplete  Urine culture     Status: Abnormal   Collection Time: 06/17/16  3:27 AM  Result Value Ref Range Status   Specimen Description URINE, CATHETERIZED  Final   Special Requests NONE  Final   Culture (A)  Final    >=100,000 COLONIES/mL KLEBSIELLA PNEUMONIAE >=100,000 COLONIES/mL ESCHERICHIA COLI    Report Status 06/19/2016 FINAL  Final   Organism ID, Bacteria KLEBSIELLA PNEUMONIAE (A)  Final   Organism ID, Bacteria ESCHERICHIA COLI (A)  Final      Susceptibility   Escherichia coli - MIC*    AMPICILLIN 4 SENSITIVE Sensitive     CEFAZOLIN <=4 SENSITIVE Sensitive     CEFTRIAXONE <=1 SENSITIVE Sensitive     CIPROFLOXACIN <=0.25 SENSITIVE Sensitive     GENTAMICIN <=1 SENSITIVE Sensitive     IMIPENEM <=0.25 SENSITIVE Sensitive     NITROFURANTOIN <=16 SENSITIVE Sensitive     TRIMETH/SULFA <=20 SENSITIVE Sensitive     AMPICILLIN/SULBACTAM <=2 SENSITIVE Sensitive     PIP/TAZO <=4 SENSITIVE Sensitive     Extended ESBL NEGATIVE Sensitive     * >=100,000 COLONIES/mL ESCHERICHIA COLI   Klebsiella pneumoniae - MIC*    AMPICILLIN 16 RESISTANT Resistant     CEFAZOLIN <=4 SENSITIVE Sensitive     CEFTRIAXONE <=1 SENSITIVE Sensitive     CIPROFLOXACIN <=0.25  SENSITIVE Sensitive     GENTAMICIN <=1 SENSITIVE Sensitive     IMIPENEM <=0.25 SENSITIVE Sensitive     NITROFURANTOIN <=16 SENSITIVE Sensitive     TRIMETH/SULFA <=20 SENSITIVE Sensitive     AMPICILLIN/SULBACTAM <=2 SENSITIVE Sensitive     PIP/TAZO <=4 SENSITIVE Sensitive     Extended ESBL NEGATIVE Sensitive     * >=100,000 COLONIES/mL KLEBSIELLA PNEUMONIAE  MRSA PCR Screening     Status: None   Collection Time: 06/17/16  5:36 PM  Result Value Ref Range Status   MRSA by PCR NEGATIVE NEGATIVE Final    Comment:        The GeneXpert MRSA Assay (FDA approved for NASAL specimens only), is one component of a comprehensive MRSA colonization surveillance program. It is not intended to diagnose MRSA infection nor to guide or monitor treatment for MRSA infections.          Radiology Studies: No results found.      Scheduled Meds: . cephALEXin  500 mg Oral Q12H  . collagenase   Topical Daily  . feeding supplement (ENSURE ENLIVE)  237 mL Oral BID BM  . metoprolol tartrate  50 mg Oral TID  . morphine  15 mg Oral Daily  . morphine  30 mg Oral QHS  . oxybutynin  5 mg Oral BID  . pantoprazole  40 mg Oral Daily  . predniSONE  2.5 mg Oral Q breakfast  . rivaroxaban  20 mg Oral Q supper  . sertraline  50 mg Oral Daily   Continuous  Infusions:    LOS: 5 days    Time spent: 40 minutes    Elyas Villamor, Geraldo Docker, MD [facility] Pager [phone]   If 7PM-7AM, please contact night-coverage [email or web address] Password [email or web address] 06/22/2016, 12:05 PM

## 2016-06-22 NOTE — Telephone Encounter (Signed)
Called again. Patient is in the hospital  She has been in sepsis, has had UTI, her husband states she is improving   Can we discuss?

## 2016-06-22 NOTE — Progress Notes (Signed)
ANTICOAGULATION CONSULT NOTE - Initial Consult  Pharmacy Consult for xarelto Indication: atrial fibrillation  Allergies  Allergen Reactions  . Amoxicillin-Pot Clavulanate     REACTION: diarrhea  . Aspirin Nausea And Vomiting  . Atorvastatin     REACTION: feel weak  . Carisoprodol     REACTION: intolerant  . Codeine     REACTION: hallucinations  . Codeine Phosphate     REACTION: UNKNOWN  . Cortisone     REACTION: pt. reports allergy, reaction not known  . Duoderm Hydroactive     Skin irritation and pain     . Ezetimibe-Simvastatin     REACTION: feels weak  . Naproxen Sodium     REACTION: GI symptoms  . Omeprazole     REACTION: abd pain  . Quinapril Hcl     REACTION: reports allergy, reaction not known  . Rofecoxib     REACTION: reports allergy, reaction not known  . Silvadene [Silver Sulfadiazine]     Skin irritation and pain     Patient Measurements: Height: 5\' 7"  (170.2 cm) Weight: 222 lb (100.7 kg) IBW/kg (Calculated) : 61.6  Vital Signs: Temp: 97.9 F (36.6 C) (10/31 0721) Temp Source: Axillary (10/31 0721) BP: 96/55 (10/31 0721) Pulse Rate: 63 (10/31 0444)  Labs:  Recent Labs  06/20/16 0216 06/21/16 0308  HGB 9.5* 9.9*  HCT 31.5* 32.2*  PLT 230 262  CREATININE 0.64 0.82    Estimated Creatinine Clearance: 68.9 mL/min (by C-G formula based on SCr of 0.82 mg/dL).  Assessment: 83 YOF with hx MGUS and polymyalgia rheumatica who presented on 10/26 with CP/AMS. Found to be in Afib with RVR and concern for UTI (+ symptoms)  PMH: chronic pain, HTN, HLD, IBS, PMR, SVT, diverticulitis  Anticoag: s/p enox to rivaroxaban  Nephro: SCr 0.82,  Heme/Onc:  H&H 9.9/32.2, Plt 262  Plan:  D/C lovenox Xarelto 20 qd to begin at 2200 today then with dinner Educate patient  Levester Fresh, PharmD, BCPS, [facility] Clinical Pharmacist Pager [phone] 06/22/2016 10:39 AM

## 2016-06-22 NOTE — Care Management Note (Signed)
Case Management Note  Patient Details  Name: [name] MRN: [id number] Date of Birth: 12-11-1935  Subjective/Objective:   Pt lives with spouse, has cane, walker, and BSC.  Will discharge home on Xarelto, CM provided card for free 30-day free trial, informed pt that her copay will be $63 for ongoing refills.  PT recommended home health therapy but pt refused, stated her husband works but plans to take several days off to stay with her when she is discharged.  Advised pt that Dr Glori Bickers can order home health services if she changes her mind when she discharges home.             Expected Discharge Plan:  Home/Self Care  Discharge planning Services  CM Consult, Medication Assistance  HH Arranged:  Patient Refused  Status of Service:  Completed, signed off  Girard Cooter, [place] 06/22/2016, 2:33 PM

## 2016-06-22 NOTE — Telephone Encounter (Signed)
Pt called asking that Dr Glori Bickers call Dr Jenean Lindau, Urologist, to come to the hospital for a consult of her bladder. She said the nurse told her that her PCP would have to do that. I have called the hospital to get her room info, but her line was busy.

## 2016-06-23 ENCOUNTER — Encounter (HOSPITAL_BASED_OUTPATIENT_CLINIC_OR_DEPARTMENT_OTHER): Payer: 59 | Attending: Surgery

## 2016-06-23 DIAGNOSIS — Z7901 Long term (current) use of anticoagulants: Secondary | ICD-10-CM

## 2016-06-23 DIAGNOSIS — A498 Other bacterial infections of unspecified site: Secondary | ICD-10-CM

## 2016-06-23 DIAGNOSIS — R0789 Other chest pain: Secondary | ICD-10-CM

## 2016-06-23 DIAGNOSIS — I480 Paroxysmal atrial fibrillation: Secondary | ICD-10-CM

## 2016-06-23 MED ORDER — LEVOTHYROXINE SODIUM 100 MCG PO TABS
100.0000 ug | ORAL_TABLET | Freq: Every day | ORAL | Status: DC
  Administered 2016-06-24: 100 ug via ORAL
  Filled 2016-06-23: qty 1

## 2016-06-23 MED ORDER — METOPROLOL SUCCINATE ER 50 MG PO TB24
150.0000 mg | ORAL_TABLET | Freq: Every day | ORAL | Status: DC
  Administered 2016-06-23 – 2016-06-24 (×2): 150 mg via ORAL
  Filled 2016-06-23 (×2): qty 1

## 2016-06-23 NOTE — Progress Notes (Signed)
PROGRESS NOTE  [name] [id number] DOB: 07-14-1936 DOA: 06/17/2016 PCP: [name], MD   LOS: 6 days   Brief Narrative: 80 y.o.WF PMHx Depression, SVT, Chronic Urinary Incontinence with associated Chronic Wound involving the Left Medial Thigh extending to the left gluteal fold, COPD and former Tobacco use, HTN, Hypothyroidism, Chronic Back pain on MS Contin, Dyslipidemia, MGUS,Polymyalgia Rheumatica on chronic low-dose prednisone. Patient reports that around 7 PM the night PTA she began having chest pain that was constant in nature with mild shortness of breath. She went to bed and slept for a few hours in which he got up to go to the bathroom she was still having the chest pain. At no time did she have any awareness of palpitations. Because of the chest pain she reported to the ER where she was found to be in atrial fibrillation with RVR with ventricular response of 150+ beats per minute.She has not had any fevers or chills at home. She has no history of recurrent UTI. She is reporting urinary frequency and increased episodes of urinary incontinence past her baseline. In review of her outpatient records patient has lost nearly 20 pounds since August. She reports she has not made any attempts to lose weight. She is currently chest pain-free.In the ER patient was found to be febrile with a rectal temperature greater than 101 and given her abnormal urinalysis she was initially treated as a code sepsis.   Assessment & Plan: Principal Problem:   Chest pain Active Problems:   Hyperthyroidism   HYPERCHOLESTEROLEMIA   Obesity   Essential hypertension   History of SVT (supraventricular tachycardia) (HCC)   COPD (chronic obstructive pulmonary disease) (HCC)   Chronic back pain   Depression   UTI (urinary tract infection)   PMR (polymyalgia rheumatica) (HCC)   MGUS (monoclonal gammopathy of unknown significance)   Open wound of left thigh/chronic wound left gluteal fold   Urinary  incontinence   SIRS (systemic inflammatory response syndrome) (HCC)   Atrial fibrillation with rapid ventricular response (HCC)   Anemia   Pressure injury of skin   Sepsis secondary to UTI (HCC)   Klebsiella pneumoniae infection   Escherichia coli infection   Chest pain due to A fib / RVR - resolved  - troponin has normalized after very low peak at 0.07  - does not likely need ischemic w/u at this time   New-onset atrial fibrillation with RVR / History of SVT (supraventricular tachycardia) -CHADVASC 4  - Currently in NSR - Metoprolol 50 mgTID per cardiology transitioned to Toprol 150 XL today. D/w Dr. Claiborne Billings, monitor on telemetry for 24 h - Full dose Lovenox---> Xarelto starting 10/31 per pharmacy  Pan sensitive [place] UTI / history of chronic Urinary incontinence - has seen Urologist once but never followed back up  - not taking oxybutynin due to dizziness and dry mouth - narrowedabx tx to complete 7 full days of tx, now on Keflex  Urinary incontinence - outpatient follow up with Dr. Gaynelle Arabian in 1-2 weeks  Hypothyroidism - 10/26 TSH 0.019, would be consistent with Hyperthyroidism. 10/20 outpatient TSH 0.04 >PCP decreased Synthroid from 118 to 100 g daily, therefore too early for this to be reflected in f/u labs so current TSH not helpful  - resume synthroid today at 100 mcg. Will need repeat TSH in 2-3 weeks  Essential hypertension  - BP currently well controlled   COPD  - Currently asymptomatic   Chronic Open wound of left thigh /left gluteal  fold  - Ongoing issue and has proven difficult to treat  - related to chronic and recurrent urinary incontinence - WOC following   Anemia (Baseline Hemoglobin 11.6) - admit hemoglobin 8.9  - Fe studies most c/w malnutrition or ACD  - B12 marginal at 291 - replaced B-12  Non-volitional weight loss - loss of about 16 pounds since August of this year (~110kg) - I suspect this is mostly  accounted for by DH/fluid loss rather than true lean body mass vs hyperthyroidism  Chronic back pain - Followed by Dr. Hardin Negus (Pain clinic)as an outpatient - well controlled presently   Depression - Continue Zoloft 50 mg daily  PMR  - Continue low-dose chronic prednisone 2.5 mg daily - followed by Dr. Estanislado Pandy  MGUS  - Oncology has seen in hospital. No treatment required.  L third toe ischemia - Suspicious for embolic event, possibly related to afib, remainder of foot is warm w/ 1+ DP pulse. Resolved   DVT prophylaxis: Xarelto Code Status: Full Family Communication: no family bedside Disposition Plan: home 1 day pending telemetry stability  Consultants:   Cardiology   Oncology   Procedures:   None   Antimicrobials:  Vanc / Aztreonam 10/26 >> 10/26, Levaquin 10/26 >> 10/28  Keflex 10/28 >>  Subjective: - no chest pain, shortness of breath, no abdominal pain, nausea or vomiting.   Objective: Vitals:   06/22/16 1657 06/22/16 2005 06/22/16 2207 06/23/16 0533  BP: 119/64 (!) 118/55 113/61 (!) 136/48  Pulse: 63 70 60 60  Resp: 18 18  18   Temp: 97.5 F (36.4 C) 97.8 F (36.6 C)  98.3 F (36.8 C)  TempSrc: Oral Oral  Oral  SpO2: 93% 93%  93%  Weight:    100.5 kg (221 lb 8 oz)  Height:        Intake/Output Summary (Last 24 hours) at 06/23/16 1310 Last data filed at 06/22/16 1700  Gross per 24 hour  Intake              210 ml  Output                0 ml  Net              210 ml   Filed Weights   06/21/16 0212 06/22/16 0500 06/23/16 0533  Weight: 101.5 kg (223 lb 11.2 oz) 100.7 kg (222 lb) 100.5 kg (221 lb 8 oz)   Examination: Constitutional: NAD Vitals:   06/22/16 1657 06/22/16 2005 06/22/16 2207 06/23/16 0533  BP: 119/64 (!) 118/55 113/61 (!) 136/48  Pulse: 63 70 60 60  Resp: 18 18  18   Temp: 97.5 F (36.4 C) 97.8 F (36.6 C)  98.3 F (36.8 C)  TempSrc: Oral Oral  Oral  SpO2: 93% 93%  93%  Weight:    100.5 kg (221 lb 8 oz)  Height:        Eyes: PERRL, lids and conjunctivae normal Neck: normal, supple, no masses, no thyromegaly Respiratory: clear to auscultation bilaterally, no wheezing, no crackles.  Cardiovascular: Regular rate and rhythm, no murmurs / rubs / gallops. No LE edema.  Abdomen: no tenderness. Bowel sounds positive.  Musculoskeletal: no clubbing / cyanosis.  Skin: no rashes, lesions, ulcers. No induration Neurologic: non focal    Data Reviewed: I have personally reviewed following labs and imaging studies  CBC:  Recent Labs Lab 06/17/16 0601 06/18/16 0138 06/19/16 0128 06/20/16 0216 06/21/16 0308  WBC 10.6* 8.5 8.1 6.6 7.2  NEUTROABS 8.3*  5.6  --   --   --   HGB 8.9* 9.4* 9.6* 9.5* 9.9*  HCT 28.7* 29.7* 30.8* 31.5* 32.2*  MCV 89.1 88.4 87.7 90.5 89.0  PLT 170 192 229 230 [id number]   Basic Metabolic Panel:  Recent Labs Lab 06/17/16 0305 06/17/16 1331 06/18/16 0138 06/19/16 0128 06/20/16 0216 06/21/16 0308  NA 136  --  139 139 139 138  K 3.5  --  3.1* 3.6 4.5 4.7  CL 100*  --  109 108 111 107  CO2 26  --  23 24 25 26   GLUCOSE 141*  --  88 109* 109* 97  BUN 12  --  9 11 13 12   CREATININE 0.84  --  0.64 0.76 0.64 0.82  CALCIUM 9.0  --  8.3* 8.5* 8.6* 8.7*  MG  --  1.5* 2.1 1.8 2.2 1.9  PHOS  --  2.9  --   --   --  2.4*   GFR: Estimated Creatinine Clearance: 68.9 mL/min (by C-G formula based on SCr of 0.82 mg/dL). Liver Function Tests:  Recent Labs Lab 06/17/16 0305 06/19/16 0128 06/21/16 0308  AST 22 16 33  ALT 9* 10* 16  ALKPHOS 60 51 60  BILITOT 0.9 0.6 0.6  PROT 6.9 5.1* 5.5*  ALBUMIN 2.9* 2.1* 2.4*   No results for input(s): LIPASE, AMYLASE in the last 168 hours. No results for input(s): AMMONIA in the last 168 hours. Coagulation Profile: No results for input(s): INR, PROTIME in the last 168 hours. Cardiac Enzymes:  Recent Labs Lab 06/17/16 1331 06/17/16 1955 06/18/16 0138 06/18/16 0647 06/18/16 1316  TROPONINI 0.04* 0.04* 0.05* 0.03* <0.03   BNP (last 3  results)  Recent Labs  09/11/15 1200  PROBNP 326.0*   HbA1C: No results for input(s): HGBA1C in the last 72 hours. CBG: No results for input(s): GLUCAP in the last 168 hours. Lipid Profile: No results for input(s): CHOL, HDL, LDLCALC, TRIG, CHOLHDL, LDLDIRECT in the last 72 hours. Thyroid Function Tests: No results for input(s): TSH, T4TOTAL, FREET4, T3FREE, THYROIDAB in the last 72 hours. Anemia Panel: No results for input(s): VITAMINB12, FOLATE, FERRITIN, TIBC, IRON, RETICCTPCT in the last 72 hours. Urine analysis:    Component Value Date/Time   COLORURINE AMBER (A) 06/17/2016 0327   APPEARANCEUR HAZY (A) 06/17/2016 0327   LABSPEC 1.027 06/17/2016 0327   LABSPEC 1.025 07/22/2015 1216   PHURINE 6.0 06/17/2016 0327   GLUCOSEU NEGATIVE 06/17/2016 0327   GLUCOSEU Negative 07/22/2015 1216   HGBUR MODERATE (A) 06/17/2016 0327   HGBUR trace-lysed 12/27/2006 1459   BILIRUBINUR SMALL (A) 06/17/2016 0327   BILIRUBINUR Negative 07/22/2015 1216   KETONESUR 40 (A) 06/17/2016 0327   PROTEINUR 100 (A) 06/17/2016 0327   UROBILINOGEN 0.2 07/22/2015 1216   NITRITE POSITIVE (A) 06/17/2016 0327   LEUKOCYTESUR MODERATE (A) 06/17/2016 0327   LEUKOCYTESUR Negative 07/22/2015 1216   Sepsis Labs: Invalid input(s): PROCALCITONIN, LACTICIDVEN  Recent Results (from the past 240 hour(s))  Blood Culture (routine x 2)     Status: None   Collection Time: 06/17/16  3:05 AM  Result Value Ref Range Status   Specimen Description BLOOD LEFT ARM  Final   Special Requests BOTTLES DRAWN AEROBIC AND ANAEROBIC 5ML  Final   Culture NO GROWTH 5 DAYS  Final   Report Status 06/22/2016 FINAL  Final  Blood Culture (routine x 2)     Status: None   Collection Time: 06/17/16  3:20 AM  Result Value Ref Range Status  Specimen Description BLOOD LEFT HAND  Final   Special Requests IN PEDIATRIC BOTTLE 3ML  Final   Culture NO GROWTH 5 DAYS  Final   Report Status 06/22/2016 FINAL  Final  Urine culture     Status:  Abnormal   Collection Time: 06/17/16  3:27 AM  Result Value Ref Range Status   Specimen Description URINE, CATHETERIZED  Final   Special Requests NONE  Final   Culture (A)  Final    >=100,000 COLONIES/mL KLEBSIELLA PNEUMONIAE >=100,000 COLONIES/mL ESCHERICHIA COLI    Report Status 06/19/2016 FINAL  Final   Organism ID, Bacteria KLEBSIELLA PNEUMONIAE (A)  Final   Organism ID, Bacteria ESCHERICHIA COLI (A)  Final      Susceptibility   Escherichia coli - MIC*    AMPICILLIN 4 SENSITIVE Sensitive     CEFAZOLIN <=4 SENSITIVE Sensitive     CEFTRIAXONE <=1 SENSITIVE Sensitive     CIPROFLOXACIN <=0.25 SENSITIVE Sensitive     GENTAMICIN <=1 SENSITIVE Sensitive     IMIPENEM <=0.25 SENSITIVE Sensitive     NITROFURANTOIN <=16 SENSITIVE Sensitive     TRIMETH/SULFA <=20 SENSITIVE Sensitive     AMPICILLIN/SULBACTAM <=2 SENSITIVE Sensitive     PIP/TAZO <=4 SENSITIVE Sensitive     Extended ESBL NEGATIVE Sensitive     * >=100,000 COLONIES/mL ESCHERICHIA COLI   Klebsiella pneumoniae - MIC*    AMPICILLIN 16 RESISTANT Resistant     CEFAZOLIN <=4 SENSITIVE Sensitive     CEFTRIAXONE <=1 SENSITIVE Sensitive     CIPROFLOXACIN <=0.25 SENSITIVE Sensitive     GENTAMICIN <=1 SENSITIVE Sensitive     IMIPENEM <=0.25 SENSITIVE Sensitive     NITROFURANTOIN <=16 SENSITIVE Sensitive     TRIMETH/SULFA <=20 SENSITIVE Sensitive     AMPICILLIN/SULBACTAM <=2 SENSITIVE Sensitive     PIP/TAZO <=4 SENSITIVE Sensitive     Extended ESBL NEGATIVE Sensitive     * >=100,000 COLONIES/mL KLEBSIELLA PNEUMONIAE  MRSA PCR Screening     Status: None   Collection Time: 06/17/16  5:36 PM  Result Value Ref Range Status   MRSA by PCR NEGATIVE NEGATIVE Final    Comment:        The GeneXpert MRSA Assay (FDA approved for NASAL specimens only), is one component of a comprehensive MRSA colonization surveillance program. It is not intended to diagnose MRSA infection nor to guide or monitor treatment for MRSA infections.        Radiology Studies: No results found.   Scheduled Meds: . cephALEXin  500 mg Oral Q12H  . collagenase   Topical Daily  . feeding supplement (ENSURE ENLIVE)  237 mL Oral BID BM  . levothyroxine  100 mcg Oral QAC breakfast  . metoprolol succinate  150 mg Oral Daily  . morphine  15 mg Oral Daily  . morphine  30 mg Oral QHS  . oxybutynin  5 mg Oral BID  . pantoprazole  40 mg Oral Daily  . predniSONE  2.5 mg Oral Q breakfast  . rivaroxaban  20 mg Oral Q supper  . sertraline  50 mg Oral Daily   Continuous Infusions:    Marzetta Board, MD, PhD [facility] Pager [phone]  If 7PM-7AM, please contact night-coverage [email or web address] Password [email or web address] 06/23/2016, 1:10 PM

## 2016-06-23 NOTE — Progress Notes (Signed)
Patient sitting up in bed, pain meds given. No other needs at this time. Call light within reach.

## 2016-06-23 NOTE — Telephone Encounter (Signed)
Pt has been in the hospital for the last few days, will try to f/u with her once she is d/c

## 2016-06-23 NOTE — Care Management Note (Addendum)
Case Management Note Marvetta Gibbons RN, BSN Unit 2W-Case Manager [phone]  Patient Details  Name: [name] MRN: [id number] Date of Birth: 02/16/1936  Subjective/Objective:   Pt admitted with afib                 Action/Plan: PTA pt lived at home - plan to return home- order written for HHPT, spoke with pt at bedside- choice offered for [facility] agencies- per pt she has used [place] in the past- (now known as [facility])- pt would like to use them again- she would like to know if she has any copay cost- will have [facility] check on that and let pt know- pt states she has needed DME- referral called to [facility] with [facility] for [place]-  Confirmed that pt has 30 day free card for Xarelto.   Expected Discharge Date:                Expected Discharge Plan:  [place]  In-House Referral:     Discharge planning Services  CM Consult, Medication Assistance  Post Acute Care Choice:  Home Health Choice offered to:  Patient  DME Arranged:    DME Agency:     HH Arranged:  PT/RN [place]:  [facility] (now [facility])  Status of Service:  Completed, signed off  If discussed at [employer] of Stay Meetings, dates discussed:    Additional Comments:  Dawayne Patricia, RN 06/23/2016, 11:29 PM

## 2016-06-23 NOTE — Progress Notes (Signed)
Patient Name: [name] Date of Encounter: 06/23/2016  Primary Cardiologist: Dr. [place]  Hospital Problem List     Principal Problem:   Chest pain Active Problems:   Hyperthyroidism   HYPERCHOLESTEROLEMIA   Obesity   Essential hypertension   History of SVT (supraventricular tachycardia) (HCC)   COPD (chronic obstructive pulmonary disease) (HCC)   Chronic back pain   Depression   UTI (urinary tract infection)   PMR (polymyalgia rheumatica) (HCC)   MGUS (monoclonal gammopathy of unknown significance)   Open wound of left thigh/chronic wound left gluteal fold   Urinary incontinence   SIRS (systemic inflammatory response syndrome) (HCC)   Atrial fibrillation with rapid ventricular response (HCC)   Anemia   Pressure injury of skin   Sepsis secondary to UTI ([place])   Klebsiella pneumoniae infection   Escherichia coli infection     Subjective   Tired, says she hurts all over. Denies chest pain and palpitations.   Inpatient Medications    Scheduled Meds: . cephALEXin  500 mg Oral Q12H  . collagenase   Topical Daily  . feeding supplement (ENSURE ENLIVE)  237 mL Oral BID BM  . metoprolol tartrate  50 mg Oral TID  . morphine  15 mg Oral Daily  . morphine  30 mg Oral QHS  . oxybutynin  5 mg Oral BID  . pantoprazole  40 mg Oral Daily  . predniSONE  2.5 mg Oral Q breakfast  . rivaroxaban  20 mg Oral Q supper  . sertraline  50 mg Oral Daily   Continuous Infusions:   PRN Meds: acetaminophen, ondansetron (ZOFRAN) IV, oxyCODONE   Vital Signs    Vitals:   06/22/16 1657 06/22/16 2005 06/22/16 2207 06/23/16 0533  BP: 119/64 (!) 118/55 113/61 (!) 136/48  Pulse: 63 70 60 60  Resp: 18 18  18   Temp: 97.5 F (36.4 C) 97.8 F (36.6 C)  98.3 F (36.8 C)  TempSrc: Oral Oral  Oral  SpO2: 93% 93%  93%  Weight:    221 lb 8 oz (100.5 kg)  Height:        Intake/Output Summary (Last 24 hours) at 06/23/16 0726 Last data filed at 06/22/16 1700  Gross per 24 hour    Intake              210 ml  Output                0 ml  Net              210 ml   Filed Weights   06/21/16 0212 06/22/16 0500 06/23/16 0533  Weight: 223 lb 11.2 oz (101.5 kg) 222 lb (100.7 kg) 221 lb 8 oz (100.5 kg)    Physical Exam   GEN: Well nourished, well developed,obese female in no acute distress.  HEENT: Grossly normal.  Neck: Supple, no JVD, carotid bruits, or masses. Cardiac: RRR, no murmurs, rubs, or gallops. No clubbing, cyanosis, edema.  Radials/DP/PT 2+ and equal bilaterally.  Respiratory:  Respirations regular and unlabored, clear to auscultation bilaterally. GI: Soft, nontender, nondistended, BS + x 4. MS: no deformity or atrophy. Skin: warm and dry, no rash. Neuro:  Strength and sensation are intact. Psych: AAOx3.  Normal affect.  Labs    CBC  Recent Labs  06/21/16 0308  WBC 7.2  HGB 9.9*  HCT 32.2*  MCV 89.0  PLT 99991111   Basic Metabolic Panel  Recent Labs  06/21/16 0308  NA 138  K  4.7  CL 107  CO2 26  GLUCOSE 97  BUN 12  CREATININE 0.82  CALCIUM 8.7*  MG 1.9  PHOS 2.4*   Liver Function Tests  Recent Labs  06/21/16 0308  AST 33  ALT 16  ALKPHOS 60  BILITOT 0.6  PROT 5.5*  ALBUMIN 2.4*     Telemetry    NSR- Personally Reviewed  ECG     NSR- Personally Reviewed  Radiology    No results found.  Cardiac Studies   None  Patient Profile     Ms. Northcott is a 80 year old female with a past medical history of remote PSVT, PMR on chronic steroids, HTN, HLD with statin intolerance, anemia, MGUS, and hypothyroidism. She presented to the ED on 06/17/16 with palpitations and was noted to be in Aflutter with RVR with rate in 160's. Started on diltiazem and found to have TSH of 0.019.   Assessment & Plan    1. Atrial fibrillation with RVR: Has converted to NSR, remains in NSR this am. Diltiazem gtt discontinued. On metoprolol 50mg  TID, can consolidate with Toprol XL 150 mg.   Would anticoagulate with Eliquis.   This  patients CHA2DS2-VASc Score and unadjusted Ischemic Stroke Rate (% per year) is equal to 4.8 % stroke rate/year from a score of 4 Above score calculated as 1 point each if present [CHF, HTN, DM, Vascular=MI/PAD/Aortic Plaque, Age if 65-74, or Female], 2 points each if present [Age > 75, or Stroke/TIA/TE]  2. History of SVT: Stable, continue beta blocker.   3. Oversuppressed TSH: Synthroid on hold. Management per primary team.   4. Essential hypertension: Well controlled on current regimen.      Signed, Arbutus Leas, NP  06/23/2016, 7:26 AM    Patient seen and examined. Agree with assessment and plan. Maintaining NSR rate now in the 70s. Pt apparently received a 150 mg dose of Toprol XL this am.  Will need to monitor closely to make certain that she does not get bradycardic and if so dose reduction may be necessary. Now on NOAC for anticoagulation. Consider re-instituting synthroid at reduced dose since it has been held for ~ 1 week   Troy Sine, MD, [facility] 06/23/2016 10:21 AM

## 2016-06-23 NOTE — Discharge Instructions (Signed)

## 2016-06-24 DIAGNOSIS — I10 Essential (primary) hypertension: Secondary | ICD-10-CM

## 2016-06-24 DIAGNOSIS — I4891 Unspecified atrial fibrillation: Secondary | ICD-10-CM

## 2016-06-24 MED ORDER — LEVOTHYROXINE SODIUM 88 MCG PO TABS: 88.0000 ug | ORAL_TABLET | Freq: Every day | ORAL | 0 refills | Status: DC

## 2016-06-24 MED ORDER — METOPROLOL SUCCINATE ER 50 MG PO TB24: 150.0000 mg | ORAL_TABLET | Freq: Every day | ORAL | 0 refills | Status: DC

## 2016-06-24 MED ORDER — CEPHALEXIN 500 MG PO CAPS: 500.0000 mg | ORAL_CAPSULE | Freq: Two times a day (BID) | ORAL | 0 refills | Status: DC

## 2016-06-24 MED ORDER — RIVAROXABAN 20 MG PO TABS: 20.0000 mg | ORAL_TABLET | Freq: Every day | ORAL | 0 refills | Status: DC

## 2016-06-24 NOTE — Discharge Summary (Signed)
Physician Discharge Summary  [name] [id number] DOB: [birth year] DOA: 06/17/2016  PCP: Lynn Pardon, MD  Admit date: 06/17/2016 Discharge date: 06/24/2016  Admitted From: home Disposition:  home  Recommendations for Outpatient Follow-up:  1. Follow up with PCP in 1-2 weeks 2. Follow up with [name] in 1-2 weeks 3. Keflex for 2 additional days  Home Health: PT, RN Equipment/Devices: none  Discharge Condition: stable CODE STATUS: Full Diet recommendation: heart healthy  HPI: [name] is a 80 y.o. female with medical history significant for history of prior SVT, chronic urinary incontinence with associated chronic wound involving the left medial thigh extending to the left gluteal fold, COPD and former tobacco use, hypertension, hypothyroidism, chronic back pain on MS Contin, depression, dyslipidemia, MGUS, and polymyalgia rheumatica chronic low-dose prednisone. Patient reports that around 7 PM last night she began having chest pain that was constant in nature with mild shortness of breath. She went to bed and slept for a few hours in which he got up to go to the bathroom she was still having the chest pain. At no time did she have any awareness of palpitations. Because of the chest pain she reported to the ER where she was found to be in atrial fibrillation with RVR with ventricular response of 150+ beats per minute. She has not had any fevers or chills at home. She has no history of recurrent UTI. She is reporting urinary frequency and increased episodes of urinary incontinence past her baseline. In review of her outpatient records patient has lost nearly 20 pounds since August. She reports she has not made any attempts to lose weight. She is currently chest pain-free. In the ER patient was found to be febrile with a rectal temperature greater than 101 and given her abnormal urinalysis she was initially treated as a code sepsis.  Hospital Course: Discharge  Diagnoses:  Principal Problem:   Chest pain Active Problems:   Hyperthyroidism   HYPERCHOLESTEROLEMIA   Obesity   Essential hypertension   History of SVT (supraventricular tachycardia) (HCC)   COPD (chronic obstructive pulmonary disease) (HCC)   Chronic back pain   Depression   UTI (urinary tract infection)   PMR (polymyalgia rheumatica) (HCC)   MGUS (monoclonal gammopathy of unknown significance)   Open wound of left thigh/chronic wound left gluteal fold   Urinary incontinence   SIRS (systemic inflammatory response syndrome) (HCC)   Atrial fibrillation with rapid ventricular response (HCC)   Anemia   Pressure injury of skin   Sepsis secondary to UTI (HCC)   Klebsiella pneumoniae infection   Escherichia coli infection  Chest pain due to A fib / RVR - resolved  - troponin has normalized after very low peak at 0.07  - does not likely need ischemic w/u at this time, cardiology consulted and have followed patient while hospitalized New-onset atrial fibrillation with RVR / History of SVT (supraventricular tachycardia) - CHADVASC 4  - Currently in NSR - Metoprolol 50 mgTID per cardiology transitioned to Toprol 150 XL today. D/w [name], monitor on telemetry for 24 h - Full dose Lovenox--->Xarelto starting 10/31 per pharmacy Pan sensitive [place] / history of chronic Urinary incontinence - has seen Urologist once but never followed back up  - not taking oxybutynin due to dizziness and dry mouth - narrowedabx tx to complete 7 full days of tx, now on Keflex Urinary incontinence - outpatient follow up with [name] in 1-2 weeks Hypothyroidism - 10/26 TSH 0.019, would be  consistent with Hyperthyroidism. 10/20outpatient TSH 0.04 >PCP decreased Synthroid from 118 to 100 g daily, thereforetoo early for this to be reflected in f/u labs so current TSH not helpful - synthroid at 88 mcg on discharge. Will need repeat TSH in 2-3  weeks Essential hypertension  - BP currently well controlled  COPD  - Currently asymptomatic  Chronic Open wound of left thigh /left gluteal fold  - Ongoing issue and has proven difficult to treat  - related to chronic and recurrent urinary incontinence - WOC following  Anemia(Baseline Hemoglobin 11.6) - admit hemoglobin 8.9  - Fe studies most c/w malnutrition or ACD  - B12 marginal at 291 - replaced B-12 Non-volitional weight loss - loss of about 16 pounds since August of this year (~110kg) - I suspect this is mostly accounted for by DH/fluid loss rather than true lean body mass vs hyperthyroidism Chronic back pain - Followed byDr. Phillips(Pain clinic)as an outpatient - well controlled presently  Depression - Continue Zoloft50 mg daily PMR  - Continue low-dose chronic prednisone2.5 mg daily - followed by [name] MGUS  - Oncology has seen in hospital. No treatment required. L third toe ischemia - Suspicious for embolic event, possibly related to afib,remainder of foot is warm w/ 1+ DP pulse. Resolved   Discharge Instructions     Medication List    STOP taking these medications   metoprolol tartrate 25 MG tablet Commonly known as:  LOPRESSOR     TAKE these medications   cephALEXin 500 MG capsule Commonly known as:  KEFLEX Take 1 capsule (500 mg total) by mouth every 12 (twelve) hours.   levothyroxine 88 MCG tablet Commonly known as:  SYNTHROID, LEVOTHROID Take 1 tablet (88 mcg total) by mouth daily. What changed:  medication strength  how much to take   meclizine 25 MG tablet Commonly known as:  ANTIVERT TAKE 1 TABLET (25 MG TOTAL) BY MOUTH 3 (THREE) TIMES DAILY AS NEEDED FOR DIZZINESS.   metoprolol succinate 50 MG 24 hr tablet Commonly known as:  TOPROL-XL Take 3 tablets (150 mg total) by mouth daily.   morphine 15 MG 12 hr tablet Commonly known as:  MS CONTIN Take 1 tablet by mouth daily.   morphine 30 MG 12 hr tablet Commonly known  as:  MS CONTIN Take 1 tablet by mouth daily.   oxybutynin 5 MG tablet Commonly known as:  DITROPAN Take 1 tablet (5 mg total) by mouth 2 (two) times daily.   pantoprazole 40 MG tablet Commonly known as:  PROTONIX TAKE 1 TABLET (40 MG TOTAL) BY MOUTH DAILY.   predniSONE 5 MG tablet Commonly known as:  DELTASONE Take 2mg  daily. What changed:  Another medication with the same name was added. Make sure you understand how and when to take each.   predniSONE 1 MG tablet Commonly known as:  DELTASONE TAKE 4 TABLETS BY MOUTH DAILLY IN THE MORNING IN [place], 3 DAILY SEPTEMBER, 2 DAILY OCTOBER What changed:  You were already taking a medication with the same name, and this prescription was added. Make sure you understand how and when to take each.   PRESCRIPTION MEDICATION Apply 1 application topically 3 (three) times daily. Triam Silver   rivaroxaban 20 MG Tabs tablet Commonly known as:  XARELTO Take 1 tablet (20 mg total) by mouth daily with supper.   sertraline 50 MG tablet Commonly known as:  ZOLOFT TAKE 1 TABLET BY MOUTH EVERY DAY   spironolactone 25 MG tablet Commonly known as:  ALDACTONE  TAKE 1 TABLET (25 MG TOTAL) BY MOUTH DAILY.   trolamine salicylate 10 % cream Commonly known as:  ASPERCREME Apply 1 application topically 2 (two) times daily as needed for muscle pain.      Follow-up Information    [facility] .   Specialty:  Home Health Services Why:  HHPT/RN arranged Contact information: [place] [address] [phone]        Ailene Rud, MD. Schedule an appointment as soon as possible for a visit in 1 week(s).   Specialty:  Urology Why:  PLEASE CALL IF NO APPT SCHEDULED BEFORE DISCHARGE Contact information: [address] [place] [address] [phone]          Allergies  Allergen Reactions  . Amoxicillin-Pot Clavulanate     REACTION: diarrhea  . Aspirin Nausea And Vomiting  . Atorvastatin     REACTION:  feel weak  . Carisoprodol     REACTION: intolerant  . Codeine     REACTION: hallucinations  . Codeine Phosphate     REACTION: UNKNOWN  . Cortisone     REACTION: pt. reports allergy, reaction not known  . Duoderm Hydroactive     Skin irritation and pain     . Ezetimibe-Simvastatin     REACTION: feels weak  . Naproxen Sodium     REACTION: GI symptoms  . Omeprazole     REACTION: abd pain  . Quinapril Hcl     REACTION: reports allergy, reaction not known  . Rofecoxib     REACTION: reports allergy, reaction not known  . Silvadene [Silver Sulfadiazine]     Skin irritation and pain     Consultations:  Cardiology   Procedures/Studies:  [place] Extrem Low Left Ltd  Result Date: 06/17/2016 CLINICAL DATA:  Patient has an open wound along the upper left thigh. EXAM: ULTRASOUND LEFT LOWER EXTREMITY LIMITED TECHNIQUE: Ultrasound examination of the lower extremity soft tissues was performed in the area of clinical concern. COMPARISON:  None FINDINGS: There is no mass or fluid collection. Specifically, there is no evidence of an abscess. IMPRESSION: No soft tissue abscess. Electronically Signed   By: Lajean Manes M.D.   On: 06/17/2016 15:43   Dg Chest Portable 1 View  Result Date: 06/17/2016 CLINICAL DATA:  Chest pain Code sepsis EXAM: PORTABLE CHEST 1 VIEW COMPARISON:  04/02/2016 FINDINGS: Chronic diffuse interstitial changes are again noted. Probable scarring at the left lung base. Low lung volumes. No gross consolidation. Cannot exclude tiny left effusion. Stable mild cardiomegaly with atherosclerosis. No pneumothorax. Degenerative changes of the right shoulder. IMPRESSION: 1. Stable mild cardiomegaly. 2. Diffuse interstitial changes, chronic. Cannot rule out tiny left effusion. 3. Atherosclerosis of the aorta Electronically Signed   By: Donavan Foil M.D.   On: 06/17/2016 03:37      Subjective: - no chest pain, shortness of breath, no abdominal pain, nausea or vomiting.    Discharge  Exam: Vitals:   06/24/16 0500 06/24/16 1325  BP: 119/66 (!) 104/48  Pulse: 72 62  Resp: 16 17  Temp: 97.8 F (36.6 C) 97.9 F (36.6 C)   Vitals:   06/23/16 1321 06/23/16 2016 06/24/16 0500 06/24/16 1325  BP: (!) 100/48 136/69 119/66 (!) 104/48  Pulse: 74 66 72 62  Resp: 19 18 16 17   Temp: 98.3 F (36.8 C) 98.5 F (36.9 C) 97.8 F (36.6 C) 97.9 F (36.6 C)  TempSrc: Oral Oral Oral Oral  SpO2: 96% 92% 93% 97%  Weight:   100.7 kg (221 lb 14.4 oz)   Height:        General: Pt is alert, awake, not in acute distress Cardiovascular: RRR, S1/S2 +, no rubs, no gallops Respiratory: CTA bilaterally, no wheezing, no rhonchi Abdominal: Soft, NT, ND, bowel sounds + Extremities: no edema, no cyanosis    The results of significant diagnostics from this hospitalization (including imaging, microbiology, ancillary and laboratory) are listed below for reference.     Microbiology: Recent Results (from the past 240 hour(s))  Blood Culture (routine x 2)     Status: None   Collection Time: 06/17/16  3:05 AM  Result Value Ref Range Status   Specimen Description BLOOD LEFT ARM  Final   Special Requests BOTTLES DRAWN AEROBIC AND ANAEROBIC 5ML  Final   Culture NO GROWTH 5 DAYS  Final   Report Status 06/22/2016 FINAL  Final  Blood Culture (routine x 2)     Status: None   Collection Time: 06/17/16  3:20 AM  Result Value Ref Range Status   Specimen Description BLOOD LEFT HAND  Final   Special Requests IN PEDIATRIC BOTTLE 3ML  Final   Culture NO GROWTH 5 DAYS  Final   Report Status 06/22/2016 FINAL  Final  Urine culture     Status: Abnormal   Collection Time: 06/17/16  3:27 AM  Result Value Ref Range Status   Specimen Description URINE, CATHETERIZED  Final   Special Requests NONE  Final   Culture (A)  Final    >=100,000 COLONIES/mL KLEBSIELLA PNEUMONIAE >=100,000 COLONIES/mL ESCHERICHIA COLI    Report Status 06/19/2016 FINAL  Final   Organism ID, Bacteria KLEBSIELLA PNEUMONIAE (A)   Final   Organism ID, Bacteria ESCHERICHIA COLI (A)  Final      Susceptibility   Escherichia coli - MIC*    AMPICILLIN 4 SENSITIVE Sensitive     CEFAZOLIN <=4 SENSITIVE Sensitive     CEFTRIAXONE <=1 SENSITIVE Sensitive     CIPROFLOXACIN <=0.25 SENSITIVE Sensitive     GENTAMICIN <=1 SENSITIVE Sensitive     IMIPENEM <=0.25 SENSITIVE Sensitive     NITROFURANTOIN <=16 SENSITIVE Sensitive     TRIMETH/SULFA <=20 SENSITIVE Sensitive     AMPICILLIN/SULBACTAM <=2 SENSITIVE Sensitive     PIP/TAZO <=4 SENSITIVE Sensitive     Extended ESBL NEGATIVE Sensitive     * >=100,000 COLONIES/mL ESCHERICHIA COLI   Klebsiella pneumoniae - MIC*    AMPICILLIN 16 RESISTANT Resistant     CEFAZOLIN <=4 SENSITIVE Sensitive     CEFTRIAXONE <=1 SENSITIVE Sensitive     CIPROFLOXACIN <=0.25 SENSITIVE Sensitive     GENTAMICIN <=1 SENSITIVE Sensitive     IMIPENEM <=0.25 SENSITIVE Sensitive     NITROFURANTOIN <=16 SENSITIVE Sensitive     TRIMETH/SULFA <=20 SENSITIVE Sensitive     AMPICILLIN/SULBACTAM <=2 SENSITIVE Sensitive     PIP/TAZO <=4 SENSITIVE Sensitive     Extended ESBL NEGATIVE Sensitive     * >=100,000 COLONIES/mL KLEBSIELLA PNEUMONIAE  MRSA PCR Screening     Status: None   Collection Time: 06/17/16  5:36 PM  Result Value Ref Range Status   MRSA by PCR NEGATIVE NEGATIVE Final    Comment:        The GeneXpert MRSA Assay (FDA approved for NASAL specimens only), is one component of a comprehensive MRSA colonization surveillance program. It is not intended to diagnose MRSA infection nor to guide or monitor treatment for MRSA infections.      Labs: BNP (last 3 results)  Recent Labs  09/22/15 0952  BNP [id number]*   Basic Metabolic Panel:  Recent Labs Lab 06/18/16 0138 06/19/16 0128 06/20/16 0216 06/21/16 0308  NA 139 139 139 138  K 3.1* 3.6 4.5 4.7  CL 109 108 111 107  CO2 23 24 25 26   GLUCOSE 88 109* 109* 97  BUN 9 11 13 12   CREATININE 0.64 0.76 0.64 0.82  CALCIUM 8.3* 8.5* 8.6* 8.7*   MG 2.1 1.8 2.2 1.9  PHOS  --   --   --  2.4*   Liver Function Tests:  Recent Labs Lab 06/19/16 0128 06/21/16 0308  AST 16 33  ALT 10* 16  ALKPHOS 51 60  BILITOT 0.6 0.6  PROT 5.1* 5.5*  ALBUMIN 2.1* 2.4*   No results for input(s): LIPASE, AMYLASE in the last 168 hours. No results for input(s): AMMONIA in the last 168 hours. CBC:  Recent Labs Lab 06/18/16 0138 06/19/16 0128 06/20/16 0216 06/21/16 0308  WBC 8.5 8.1 6.6 7.2  NEUTROABS 5.6  --   --   --   HGB 9.4* 9.6* 9.5* 9.9*  HCT 29.7* 30.8* 31.5* 32.2*  MCV 88.4 87.7 90.5 89.0  PLT 192 229 230 262   Cardiac Enzymes:  Recent Labs Lab 06/17/16 1955 06/18/16 0138 06/18/16 0647 06/18/16 1316  TROPONINI 0.04* 0.05* 0.03* <0.03   BNP: Invalid input(s): POCBNP CBG: No results for input(s): GLUCAP in the last 168 hours. D-Dimer No results for input(s): DDIMER in the last 72 hours. Hgb A1c No results for input(s): HGBA1C in the last 72 hours. Lipid Profile No results for input(s): CHOL, HDL, LDLCALC, TRIG, CHOLHDL, LDLDIRECT in the last 72 hours. Thyroid function studies No results for input(s): TSH, T4TOTAL, T3FREE, THYROIDAB in the last 72 hours.  Invalid input(s): FREET3 Anemia work up No results for input(s): VITAMINB12, FOLATE, FERRITIN, TIBC, IRON, RETICCTPCT in the last 72 hours. Urinalysis    Component Value Date/Time   COLORURINE AMBER (A) 06/17/2016 0327   APPEARANCEUR HAZY (A) 06/17/2016 0327   LABSPEC 1.027 06/17/2016 0327   LABSPEC 1.025 07/22/2015 1216   PHURINE 6.0 06/17/2016 0327   GLUCOSEU NEGATIVE 06/17/2016 0327   GLUCOSEU Negative 07/22/2015 1216   HGBUR MODERATE (A) 06/17/2016 0327   HGBUR trace-lysed 12/27/2006 1459   BILIRUBINUR SMALL (A) 06/17/2016 0327   BILIRUBINUR Negative 07/22/2015 1216   KETONESUR 40 (A) 06/17/2016 0327   PROTEINUR 100 (A) 06/17/2016 0327   UROBILINOGEN 0.2 07/22/2015 1216   NITRITE POSITIVE (A) 06/17/2016 0327   LEUKOCYTESUR MODERATE (A) 06/17/2016  0327   LEUKOCYTESUR Negative 07/22/2015 1216   Sepsis Labs Invalid input(s): PROCALCITONIN,  WBC,  LACTICIDVEN Microbiology Recent Results (from the past 240 hour(s))  Blood Culture (routine x 2)     Status: None   Collection Time: 06/17/16  3:05 AM  Result Value Ref Range Status   Specimen Description BLOOD LEFT ARM  Final   Special Requests BOTTLES DRAWN AEROBIC AND ANAEROBIC 5ML  Final   Culture NO GROWTH 5 DAYS  Final   Report Status 06/22/2016 FINAL  Final  Blood Culture (routine x 2)     Status: None   Collection Time: 06/17/16  3:20 AM  Result Value Ref Range Status   Specimen Description BLOOD LEFT HAND  Final   Special Requests IN PEDIATRIC BOTTLE 3ML  Final   Culture NO GROWTH 5 DAYS  Final   Report Status 06/22/2016 FINAL  Final  Urine culture     Status: Abnormal   Collection Time:  06/17/16  3:27 AM  Result Value Ref Range Status   Specimen Description URINE, CATHETERIZED  Final   Special Requests NONE  Final   Culture (A)  Final    >=100,000 COLONIES/mL KLEBSIELLA PNEUMONIAE >=100,000 COLONIES/mL ESCHERICHIA COLI    Report Status 06/19/2016 FINAL  Final   Organism ID, Bacteria KLEBSIELLA PNEUMONIAE (A)  Final   Organism ID, Bacteria ESCHERICHIA COLI (A)  Final      Susceptibility   Escherichia coli - MIC*    AMPICILLIN 4 SENSITIVE Sensitive     CEFAZOLIN <=4 SENSITIVE Sensitive     CEFTRIAXONE <=1 SENSITIVE Sensitive     CIPROFLOXACIN <=0.25 SENSITIVE Sensitive     GENTAMICIN <=1 SENSITIVE Sensitive     IMIPENEM <=0.25 SENSITIVE Sensitive     NITROFURANTOIN <=16 SENSITIVE Sensitive     TRIMETH/SULFA <=20 SENSITIVE Sensitive     AMPICILLIN/SULBACTAM <=2 SENSITIVE Sensitive     PIP/TAZO <=4 SENSITIVE Sensitive     Extended ESBL NEGATIVE Sensitive     * >=100,000 COLONIES/mL ESCHERICHIA COLI   Klebsiella pneumoniae - MIC*    AMPICILLIN 16 RESISTANT Resistant     CEFAZOLIN <=4 SENSITIVE Sensitive     CEFTRIAXONE <=1 SENSITIVE Sensitive     CIPROFLOXACIN  <=0.25 SENSITIVE Sensitive     GENTAMICIN <=1 SENSITIVE Sensitive     IMIPENEM <=0.25 SENSITIVE Sensitive     NITROFURANTOIN <=16 SENSITIVE Sensitive     TRIMETH/SULFA <=20 SENSITIVE Sensitive     AMPICILLIN/SULBACTAM <=2 SENSITIVE Sensitive     PIP/TAZO <=4 SENSITIVE Sensitive     Extended ESBL NEGATIVE Sensitive     * >=100,000 COLONIES/mL KLEBSIELLA PNEUMONIAE  MRSA PCR Screening     Status: None   Collection Time: 06/17/16  5:36 PM  Result Value Ref Range Status   MRSA by PCR NEGATIVE NEGATIVE Final    Comment:        The GeneXpert MRSA Assay (FDA approved for NASAL specimens only), is one component of a comprehensive MRSA colonization surveillance program. It is not intended to diagnose MRSA infection nor to guide or monitor treatment for MRSA infections.      Time coordinating discharge: Over 30 minutes  SIGNED:  Marzetta Board, MD  [facility] 06/24/2016, 2:06 PM Pager [phone]  If 7PM-7AM, please contact night-coverage [email or web address] Password [email or web address]

## 2016-06-24 NOTE — Progress Notes (Signed)
Patient Name: [name] Date of Encounter: 06/24/2016  Primary Cardiologist: Dr. Martinique  Hospital Problem List     Principal Problem:   Chest pain Active Problems:   Hyperthyroidism   HYPERCHOLESTEROLEMIA   Obesity   Essential hypertension   History of SVT (supraventricular tachycardia) (HCC)   COPD (chronic obstructive pulmonary disease) (HCC)   Chronic back pain   Depression   UTI (urinary tract infection)   PMR (polymyalgia rheumatica) (HCC)   MGUS (monoclonal gammopathy of unknown significance)   Open wound of left thigh/chronic wound left gluteal fold   Urinary incontinence   SIRS (systemic inflammatory response syndrome) (HCC)   Atrial fibrillation with rapid ventricular response (HCC)   Anemia   Pressure injury of skin   Sepsis secondary to UTI ([place])   Klebsiella pneumoniae infection   Escherichia coli infection     Subjective   Feels better, is up and walking with her walker. Denies palpitations.   Inpatient Medications    Scheduled Meds: . cephALEXin  500 mg Oral Q12H  . collagenase   Topical Daily  . feeding supplement (ENSURE ENLIVE)  237 mL Oral BID BM  . levothyroxine  100 mcg Oral QAC breakfast  . metoprolol succinate  150 mg Oral Daily  . morphine  15 mg Oral Daily  . morphine  30 mg Oral QHS  . oxybutynin  5 mg Oral BID  . pantoprazole  40 mg Oral Daily  . predniSONE  2.5 mg Oral Q breakfast  . rivaroxaban  20 mg Oral Q supper  . sertraline  50 mg Oral Daily   Continuous Infusions:   PRN Meds: acetaminophen, ondansetron (ZOFRAN) IV, oxyCODONE   Vital Signs    Vitals:   06/23/16 0533 06/23/16 1321 06/23/16 2016 06/24/16 0500  BP: (!) 136/48 (!) 100/48 136/69 119/66  Pulse: 60 74 66 72  Resp: 18 19 18 16   Temp: 98.3 F (36.8 C) 98.3 F (36.8 C) 98.5 F (36.9 C) 97.8 F (36.6 C)  TempSrc: Oral Oral Oral Oral  SpO2: 93% 96% 92% 93%  Weight: 221 lb 8 oz (100.5 kg)   221 lb 14.4 oz (100.7 kg)  Height:         Intake/Output Summary (Last 24 hours) at 06/24/16 0705 Last data filed at 06/23/16 2100  Gross per 24 hour  Intake              520 ml  Output                0 ml  Net              520 ml   Filed Weights   06/22/16 0500 06/23/16 0533 06/24/16 0500  Weight: 222 lb (100.7 kg) 221 lb 8 oz (100.5 kg) 221 lb 14.4 oz (100.7 kg)    Physical Exam    GEN: Well nourished, well developed, in no acute distress.  HEENT: Grossly normal.  Neck: Supple, no JVD, carotid bruits, or masses. Cardiac: RRR, no murmurs, rubs, or gallops. No clubbing, cyanosis, edema.  Radials/DP/PT 2+ and equal bilaterally.  Respiratory:  Respirations regular and unlabored, clear to auscultation bilaterally. GI: Soft, nontender, nondistended, BS + x 4. MS: no deformity or atrophy. Skin: warm and dry, no rash. Neuro:  Strength and sensation are intact. Psych: AAOx3.  Normal affect.  Labs      Telemetry    NSR - Personally Reviewed  ECG    NSR - Personally Reviewed  Radiology  No results found.  Cardiac Studies   None  Patient Profile  [name] is a 80 year old female with a past medical history of remote PSVT, PMR on chronic steroids, HTN, HLD with statin intolerance, anemia, MGUS, and hypothyroidism. She presented to the ED on 06/17/16 with palpitations and was noted to be in Aflutter with RVR with rate in 160's. Started on diltiazem and found to have TSH of 0.019.      Assessment & Plan   1. Atrial fibrillation with RVR: Has converted to NSR, remains in NSR this am. Diltiazem gtt discontinued. On Toprol 150mg  XL, would send home on this dose.   Continue Xarelto  This patients CHA2DS2-VASc Score and unadjusted Ischemic Stroke Rate (% per year) is equal to 4.8 % stroke rate/year from a score of 4 Above score calculated as 1 point each if present [CHF, HTN, DM, Vascular=MI/PAD/Aortic Plaque, Age if 65-74, or Female], 2 points each if present [Age > 75, or Stroke/TIA/TE]  2. History of  SVT: Stable, continue beta blocker.   3. Oversuppressed TSH: Synthroid was on hold.  4. Essential hypertension: Well controlled on current regimen.   Dispo: MD to advise on discharge, but likely ok to dc home from cardiac standpoint. She has an appt. With Dr. Martinique in 2 weeks.     Signed, Arbutus Leas, NP  06/24/2016, 7:05 AM    Patient seen and examined. Agree with assessment and plan. Maintaining NSR.  HR today in the 70's.  Would consider resuming synthroid at 88 ug rather than 100 ug. No chest pain. OK from cardiac perspective for dc.   Troy Sine, MD, [facility] 06/24/2016 11:08 AM

## 2016-06-25 ENCOUNTER — Telehealth: Payer: Self-pay

## 2016-06-25 ENCOUNTER — Other Ambulatory Visit: Payer: Self-pay | Admitting: Family Medicine

## 2016-06-25 MED ORDER — LEVOTHYROXINE SODIUM 88 MCG PO TABS: 88.0000 ug | ORAL_TABLET | Freq: Every day | ORAL | 1 refills | Status: AC

## 2016-06-25 NOTE — Telephone Encounter (Signed)
Rx sent to pharmacy and pt aware to take 88 mcg instead of the 170mcg, I will get our Nurse Lattie Haw to do the transitional care appt call

## 2016-06-25 NOTE — Telephone Encounter (Signed)
She was d/c at 88 mcg-please take this- they decreased her dose another notch given the low TSH Please schedule f/u (transitional care call) if needed

## 2016-06-25 NOTE — Telephone Encounter (Signed)
1st attempt for TCM outreach - left voicemail msg with contact info.

## 2016-06-25 NOTE — Telephone Encounter (Signed)
Addressed through another phone note 

## 2016-06-25 NOTE — Telephone Encounter (Signed)
Pt left v/m; pt was discharged from hospital on 06/24/16. Pt cannot remember what mg of thyroid medicine pt should be taking; pt thinks it might be 100 mcg but is not sure. Med list has levothyroxine 88 mcg to take one tab daily. Pt request cb.

## 2016-06-29 ENCOUNTER — Telehealth: Payer: Self-pay

## 2016-06-29 NOTE — Telephone Encounter (Signed)
Darlina Guys nurse with [facility] left v/m wanting to verify that Dr Glori Bickers would be pt's ordering physician. Please advise. Kecia request cb.

## 2016-06-29 NOTE — Telephone Encounter (Signed)
Darlina Guys notified of Dr. Marliss Coots comments

## 2016-06-29 NOTE — Telephone Encounter (Signed)
Yes that is fine  thanks

## 2016-06-30 ENCOUNTER — Other Ambulatory Visit: Payer: Self-pay | Admitting: Family Medicine

## 2016-06-30 DIAGNOSIS — E0590 Thyrotoxicosis, unspecified without thyrotoxic crisis or storm: Secondary | ICD-10-CM

## 2016-07-01 ENCOUNTER — Other Ambulatory Visit: Payer: 59

## 2016-07-04 ENCOUNTER — Emergency Department (HOSPITAL_COMMUNITY)
Admission: EM | Admit: 2016-07-04 | Discharge: 2016-07-04 | Disposition: A | Discharge status: Home / Self Care | Payer: 59 | Attending: Emergency Medicine | Admitting: Emergency Medicine

## 2016-07-04 ENCOUNTER — Emergency Department (HOSPITAL_COMMUNITY): Payer: 59

## 2016-07-04 ENCOUNTER — Encounter (HOSPITAL_COMMUNITY): Payer: Self-pay

## 2016-07-04 DIAGNOSIS — R0789 Other chest pain: Secondary | ICD-10-CM | POA: Diagnosis not present

## 2016-07-04 DIAGNOSIS — Z96659 Presence of unspecified artificial knee joint: Secondary | ICD-10-CM | POA: Insufficient documentation

## 2016-07-04 DIAGNOSIS — J449 Chronic obstructive pulmonary disease, unspecified: Secondary | ICD-10-CM | POA: Diagnosis not present

## 2016-07-04 DIAGNOSIS — Z7901 Long term (current) use of anticoagulants: Secondary | ICD-10-CM | POA: Diagnosis not present

## 2016-07-04 DIAGNOSIS — I10 Essential (primary) hypertension: Secondary | ICD-10-CM | POA: Diagnosis not present

## 2016-07-04 DIAGNOSIS — R0602 Shortness of breath: Secondary | ICD-10-CM | POA: Diagnosis not present

## 2016-07-04 DIAGNOSIS — E039 Hypothyroidism, unspecified: Secondary | ICD-10-CM | POA: Insufficient documentation

## 2016-07-04 DIAGNOSIS — Z79899 Other long term (current) drug therapy: Secondary | ICD-10-CM | POA: Diagnosis not present

## 2016-07-04 DIAGNOSIS — Z87891 Personal history of nicotine dependence: Secondary | ICD-10-CM | POA: Insufficient documentation

## 2016-07-04 DIAGNOSIS — R079 Chest pain, unspecified: Secondary | ICD-10-CM

## 2016-07-04 LAB — BASIC METABOLIC PANEL
ANION GAP: 10 (ref 5–15)
BUN: 16 mg/dL (ref 6–20)
CHLORIDE: 104 mmol/L (ref 101–111)
CO2: 24 mmol/L (ref 22–32)
Calcium: 9 mg/dL (ref 8.9–10.3)
Creatinine, Ser: 0.8 mg/dL (ref 0.44–1.00)
GFR calc Af Amer: >60 mL/min (ref >60)
GLUCOSE: 106 mg/dL — AB (ref 65–99)
POTASSIUM: 3.6 mmol/L (ref 3.5–5.1)
Sodium: 138 mmol/L (ref 135–145)

## 2016-07-04 LAB — CBC
HEMATOCRIT: 33.5 % — AB (ref 36.0–46.0)
HEMOGLOBIN: 10.2 g/dL — AB (ref 12.0–15.0)
MCH: 27.1 pg (ref 26.0–34.0)
MCHC: 30.4 g/dL (ref 30.0–36.0)
MCV: 88.9 fL (ref 78.0–100.0)
Platelets: 346 10*3/uL (ref 150–400)
RBC: 3.77 MIL/uL — ABNORMAL LOW (ref 3.87–5.11)
RDW: 16.6 % — AB (ref 11.5–15.5)
WBC: 10.1 10*3/uL (ref 4.0–10.5)

## 2016-07-04 LAB — D-DIMER, QUANTITATIVE: D-Dimer, Quant: 6.09 ug/mL-FEU — ABNORMAL HIGH (ref 0.00–0.50)

## 2016-07-04 LAB — BRAIN NATRIURETIC PEPTIDE: B NATRIURETIC PEPTIDE 5: 197.8 pg/mL — AB (ref 0.0–100.0)

## 2016-07-04 LAB — I-STAT TROPONIN, ED
TROPONIN I, POC: 0 ng/mL (ref 0.00–0.08)
Troponin i, poc: 0 ng/mL (ref 0.00–0.08)

## 2016-07-04 MED ORDER — IOPAMIDOL (ISOVUE-370) INJECTION 76%
INTRAVENOUS | Status: AC
  Administered 2016-07-04: 100 mL
  Filled 2016-07-04: qty 100

## 2016-07-04 MED ORDER — ONDANSETRON HCL 4 MG/2ML IJ SOLN
4.0000 mg | Freq: Once | INTRAMUSCULAR | Status: AC
  Administered 2016-07-04: 4 mg via INTRAVENOUS
  Filled 2016-07-04: qty 2

## 2016-07-04 MED ORDER — MORPHINE SULFATE (PF) 4 MG/ML IV SOLN
4.0000 mg | Freq: Once | INTRAVENOUS | Status: AC
  Administered 2016-07-04: 4 mg via INTRAVENOUS
  Filled 2016-07-04: qty 1

## 2016-07-04 MED ORDER — GI COCKTAIL ~~LOC~~
30.0000 mL | Freq: Once | ORAL | Status: AC
  Administered 2016-07-04: 30 mL via ORAL
  Filled 2016-07-04: qty 30

## 2016-07-04 MED ORDER — SODIUM CHLORIDE 0.9 % IV BOLUS (SEPSIS)
1000.0000 mL | Freq: Once | INTRAVENOUS | Status: AC
  Administered 2016-07-04: 1000 mL via INTRAVENOUS

## 2016-07-04 MED ORDER — FAMOTIDINE IN NACL 20-0.9 MG/50ML-% IV SOLN
20.0000 mg | Freq: Once | INTRAVENOUS | Status: AC
  Administered 2016-07-04: 20 mg via INTRAVENOUS
  Filled 2016-07-04: qty 50

## 2016-07-04 NOTE — ED Notes (Signed)
Patient transported to CT 

## 2016-07-04 NOTE — ED Provider Notes (Signed)
[facility] DEPT Provider Note   CSN: [id number] Arrival date & time: 07/04/16  0036  By signing my name below, I, Irene Pap, attest that this documentation has been prepared under the direction and in the presence of Merryl Hacker, MD. Electronically Signed: Irene Pap, ED Scribe. 07/04/16. 1:14 AM.  History   Chief Complaint Chief Complaint  Patient presents with  . Chest Pain  . Nausea   The history is provided by the patient. No language interpreter was used.   HPI Comments: [name] is a 80 y.o. female with a hx of chronic pain, HTN, PVCs, SVT, and polymyalgia rheumatica brought in by EMS who presents to the Emergency Department complaining of pressured, midline chest pain radiating from her epigastrium upward onset 5 hours ago. Pt reports hx of similar symptoms that were diagnosed as atrial flutter(?). She also has a history of GERD. She reports associated nausea and mild SOB. She currently rates her pain 8/10. Pt says she took something at home for pain 3 hours ago to no relief. Pt is on Xarelto and Metoprolol. She was given 324 mg aspirin, two nitroglycerin tablets, and 100 cc of NS en route. She denies hx of MI or stent placement. She denies abdominal pain.   Past Medical History:  Diagnosis Date  . Chronic pain    Managed by Dr. Hardin Negus  . Diverticulitis   . Hyperlipidemia   . Hypertension   . IBS (irritable bowel syndrome)   . Palpitations   . Polymyalgia rheumatica ([place])   . PVC's (premature ventricular contractions)   . SVT (supraventricular tachycardia) ([place])   . Tobacco dependence     Patient Active Problem List   Diagnosis Date Noted  . Sepsis secondary to UTI ([place])   . Klebsiella pneumoniae infection   . Escherichia coli infection   . Pressure injury of skin 06/18/2016  . SIRS (systemic inflammatory response syndrome) ([place]) 06/17/2016  . Atrial fibrillation with rapid ventricular response ([place]) 06/17/2016  . Anemia 06/17/2016  .  Chest pain 06/17/2016  . Lymphedema of arm 04/02/2016  . Open wound of left thigh/chronic wound left gluteal fold 04/02/2016  . Urinary incontinence 04/02/2016  . Foot ulcer ([place]) 01/13/2016  . Pedal edema 11/27/2015  . MGUS (monoclonal gammopathy of unknown significance) 11/18/2015  . Liver lesion 11/18/2015  . Leg edema, left 09/22/2015  . Microscopic hematuria 05/21/2014  . PMR (polymyalgia rheumatica) ([place]) 05/15/2014  . Elevated sed rate 04/30/2014  . UTI (urinary tract infection) 04/30/2014  . Diverticulosis 04/17/2014  . Former smoker 12/12/2012  . Depression 04/19/2011  . Obesity 10/03/2007  . VERTIGO 04/11/2007  . Hyperthyroidism 12/27/2006  . HYPERCHOLESTEROLEMIA 12/27/2006  . Essential hypertension 12/27/2006  . History of SVT (supraventricular tachycardia) ([place]) 12/27/2006  . ALLERGIC RHINITIS 12/27/2006  . COPD (chronic obstructive pulmonary disease) ([place]) 12/27/2006  . OSTEOARTHRITIS 12/27/2006  . Chronic back pain 12/27/2006    Past Surgical History:  Procedure Laterality Date  . NASAL SINUS SURGERY    . REPLACEMENT TOTAL KNEE    . TONSILLECTOMY    . WRIST FRACTURE SURGERY      OB History    No data available       Home Medications    Prior to Admission medications   Medication Sig Start Date End Date Taking? Authorizing Provider  levothyroxine (SYNTHROID, LEVOTHROID) 88 MCG tablet Take 1 tablet (88 mcg total) by mouth daily. 06/25/16  Yes Abner Greenspan, MD  meclizine (ANTIVERT) 25 MG tablet TAKE 1  TABLET (25 MG TOTAL) BY MOUTH 3 (THREE) TIMES DAILY AS NEEDED FOR DIZZINESS. 03/14/15  Yes Abner Greenspan, MD  metoprolol succinate (TOPROL-XL) 50 MG 24 hr tablet Take 3 tablets (150 mg total) by mouth daily. 06/24/16  Yes Costin Karlyne Greenspan, MD  morphine (MS CONTIN) 15 MG 12 hr tablet Take 1 tablet by mouth daily. 01/02/16  Yes Historical Provider, MD  morphine (MS CONTIN) 30 MG 12 hr tablet Take 1 tablet by mouth daily. 01/02/16  Yes Historical Provider, MD    oxybutynin (DITROPAN) 5 MG tablet Take 1 tablet (5 mg total) by mouth 2 (two) times daily. 06/11/16  Yes [place], MD  pantoprazole (PROTONIX) 40 MG tablet TAKE 1 TABLET (40 MG TOTAL) BY MOUTH DAILY. 08/26/15  Yes [place], MD  predniSONE (DELTASONE) 1 MG tablet TAKE 4 TABLETS BY MOUTH DAILLY IN THE MORNING IN [place], 3 DAILY SEPTEMBER, 2 DAILY OCTOBER Patient taking differently: Take 1 tablet once daily. 06/23/16  Yes Bo Merino, MD  PRESCRIPTION MEDICATION Apply 1 application topically 3 (three) times daily. Triam Silver   Yes Historical Provider, MD  rivaroxaban (XARELTO) 20 MG TABS tablet Take 1 tablet (20 mg total) by mouth daily with supper. 06/24/16  Yes Costin Karlyne Greenspan, MD  sertraline (ZOLOFT) 50 MG tablet TAKE 1 TABLET BY MOUTH EVERY DAY 03/08/16  Yes Abner Greenspan, MD  spironolactone (ALDACTONE) 25 MG tablet TAKE 1 TABLET (25 MG TOTAL) BY MOUTH DAILY. 05/03/16  Yes Abner Greenspan, MD  trolamine salicylate (ASPERCREME) 10 % cream Apply 1 application topically 2 (two) times daily as needed for muscle pain.   Yes Historical Provider, MD    Family History Family History  Problem Relation Age of Onset  . Diabetes Mother   . Heart attack Father   . Heart disease Sister     Social History Social History  Substance Use Topics  . Smoking status: Former Smoker    Packs/day: 0.50    Quit date: 12/12/2011  . Smokeless tobacco: Never Used  . Alcohol use No     Allergies   Amoxicillin-pot clavulanate; Aspirin; Atorvastatin; Carisoprodol; Codeine; Codeine phosphate; Cortisone; Duoderm hydroactive; Ezetimibe-simvastatin; Naproxen sodium; Omeprazole; Quinapril hcl; Rofecoxib; and Silvadene [silver sulfadiazine]   Review of Systems Review of Systems  Respiratory: Positive for shortness of breath.   Cardiovascular: Positive for chest pain.  Gastrointestinal: Positive for nausea and vomiting. Negative for abdominal pain.  All other systems reviewed and are  negative.    Physical Exam Updated Vital Signs BP 135/61 (BP Location: Right Wrist)   Pulse (!) 46   Temp 97.9 F (36.6 C) (Oral)   Resp 14   Ht 5\' 7"  (1.702 m)   Wt 216 lb (98 kg)   SpO2 93%   BMI 33.83 kg/m   Physical Exam  Constitutional: She is oriented to person, place, and time. No distress.  Overweight  HENT:  Head: Normocephalic and atraumatic.  Cardiovascular: Normal rate, regular rhythm and normal heart sounds.   Pulmonary/Chest: Effort normal. No respiratory distress. She has no wheezes.  Abdominal: Soft. Bowel sounds are normal. There is tenderness. There is no guarding.  Musculoskeletal:  Mild tenderness to palpation left calf, no obvious asymmetric swelling  Neurological: She is alert and oriented to person, place, and time.  Skin: Skin is warm and dry.  Psychiatric: She has a normal mood and affect.  Nursing note and vitals reviewed.    ED Treatments / Results  DIAGNOSTIC STUDIES: Oxygen Saturation  is 93% on RA, low by my interpretation.    COORDINATION OF CARE: 1:14 AM-Discussed treatment plan which includes labs, EKG, and x-ray with pt at bedside and pt agreed to plan.    Labs (all labs ordered are listed, but only abnormal results are displayed) Labs Reviewed  BASIC METABOLIC PANEL - Abnormal; Notable for the following:       Result Value   Glucose, Bld 106 (*)    All other components within normal limits  CBC - Abnormal; Notable for the following:    RBC 3.77 (*)    Hemoglobin 10.2 (*)    HCT 33.5 (*)    RDW 16.6 (*)    All other components within normal limits  BRAIN NATRIURETIC PEPTIDE - Abnormal; Notable for the following:    B Natriuretic Peptide 197.8 (*)    All other components within normal limits  D-DIMER, QUANTITATIVE (NOT AT [facility]) - Abnormal; Notable for the following:    D-Dimer, Quant 6.09 (*)    All other components within normal limits  Randolm Idol, ED  Randolm Idol, ED    EKG  EKG  Interpretation  Date/Time:  Sunday July 04 2016 00:54:53 EST Ventricular Rate:  47 PR Interval:    QRS Duration: 100 QT Interval:  490 QTC Calculation: 434 R Axis:   9 Text Interpretation:  Bradycardia with irregular rate Confirmed by Dina Rich  MD, COURTNEY ([id number]) on 07/04/2016 2:07:34 AM       Radiology Ct Angio Chest Pe W And/or Wo Contrast  Result Date: 07/04/2016 CLINICAL DATA:  Positive D-dimer. History of hypertension and arrhythmias. EXAM: CT ANGIOGRAPHY CHEST WITH CONTRAST TECHNIQUE: Multidetector CT imaging of the chest was performed using the standard protocol during bolus administration of intravenous contrast. Multiplanar CT image reconstructions and MIPs were obtained to evaluate the vascular anatomy. CONTRAST:  100 mL Isovue 370 COMPARISON:  None. FINDINGS: Cardiovascular: Examination of the pulmonary arteries is somewhat limited by contrast bolus and motion artifact but there is no definite evidence of significant pulmonary embolus. Normal heart size. No pericardial effusion. Calcification of the aorta and coronary arteries. Mediastinum/Nodes: No enlarged mediastinal, hilar, or axillary lymph nodes. Thyroid gland, trachea, and esophagus demonstrate no significant findings. Lungs/Pleura: Atelectasis in the lung bases. Emphysematous changes particularly in the upper lungs. Slight fibrosis. No focal consolidation. No pleural effusions. No pneumothorax. Upper Abdomen: The appearance of the liver suggests hepatic cirrhosis. The lateral segment of the left lobe is enlarged and there appears to be mild lobulation to the contour. The gallbladder is distended and filled with mildly hyperechoic material, likely representing sludge although small stones or milk of calcium could also have this appearance. No gallbladder wall thickening. No bile duct dilatation. Musculoskeletal: No chest wall abnormality. No acute or significant osseous findings. Review of the MIP images confirms the above  findings. IMPRESSION: No evidence of significant pulmonary embolus. Emphysematous changes, fibrosis, and atelectasis in the lungs. Aortic atherosclerosis. Electronically Signed   By: Lucienne Capers M.D.   On: 07/04/2016 05:18   Dg Chest Port 1 View  Result Date: 07/04/2016 CLINICAL DATA:  Chest pain tonight.  Bradycardia. EXAM: PORTABLE CHEST 1 VIEW COMPARISON:  06/17/2016 FINDINGS: Shallow inspiration. Linear atelectasis in the lung bases. No focal airspace disease or consolidation. No blunting of costophrenic angles. No pneumothorax. Calcification of the aorta. Degenerative changes in the spine and shoulders. IMPRESSION: Linear atelectasis in the lung bases.  No focal consolidation. Electronically Signed   By: Lucienne Capers M.D.   On: 07/04/2016  02:04    Procedures Procedures (including critical care time)  Medications Ordered in ED Medications  morphine 4 MG/ML injection 4 mg (4 mg Intravenous Given 07/04/16 0148)  ondansetron (ZOFRAN) injection 4 mg (4 mg Intravenous Given 07/04/16 0147)  sodium chloride 0.9 % bolus 1,000 mL (0 mLs Intravenous Stopped 07/04/16 0343)  gi cocktail (Maalox,Lidocaine,Donnatal) (30 mLs Oral Given 07/04/16 0227)  ondansetron (ZOFRAN) injection 4 mg (4 mg Intravenous Given 07/04/16 0344)  famotidine (PEPCID) IVPB 20 mg premix (0 mg Intravenous Stopped 07/04/16 0513)  iopamidol (ISOVUE-370) 76 % injection (100 mLs  Contrast Given 07/04/16 0451)     Initial Impression / Assessment and Plan / ED Course  I have reviewed the triage vital signs and the nursing notes.  Pertinent labs & imaging results that were available during my care of the patient were reviewed by me and considered in my medical decision making (see chart for details).  Clinical Course as of Jul 05 551  Nancy Fetter Jul 04, 2016  0310 Patient pain improved with GI cocktail. Now 7 out of 10. She did vomit following the GI cocktail. Has a history of reflux. Initial chest pain workup is reassuring.  Patient given IV Pepcid. 2 Re-dosed nausea medication  [CH]    Clinical Course User Index [CH] Merryl Hacker, MD   Patient presents with CP for 3 hours.  Somewhat atypical.  History of atrial flutter (?), SVT, PVC.  Has seen Dr. [place].  Noted to be bradycardic to 45 but is on metoprolol.  EKG nonischemic.  INitial work-up reassuring.  Improved with GI cocktail.  Some features of reflux.  PE also a consideration.  Dimer +.  CTA and repeat trop ordered.  Repeat trop reassuring and CTA neg.  Patient improved with nausea and PPI.  Likely multifactorial.  Given risk factors, will need formal cardiac evaluation but not convincing for ACS.  F/U with cardiology for stress evaluation.    After history, exam, and medical workup I feel the patient has been appropriately medically screened and is safe for discharge home. Pertinent diagnoses were discussed with the patient. Patient was given return precautions.   Final Clinical Impressions(s) / ED Diagnoses   Final diagnoses:  Atypical chest pain   I personally performed the services described in this documentation, which was scribed in my presence. The recorded information has been reviewed and is accurate.   New Prescriptions New Prescriptions   No medications on file     Merryl Hacker, MD 07/04/16 2305

## 2016-07-04 NOTE — Discharge Instructions (Signed)
You were seen today for chest pain. Your chest pain is atypical for heart disease; however, he needs follow-up with your cardiologist for repeat evaluation and stress testing. This may be related to your known reflux. Continue Protonix.

## 2016-07-04 NOTE — ED Notes (Signed)
Pt complains of nausea stating she had vomited before EMT came into room. Pt ambulated to bathroom and RN made aware of pt's nausea.

## 2016-07-04 NOTE — ED Triage Notes (Signed)
Pt brought in by [facility]. Pt at home c/o of chest pain x4 hours. Chest pain described as aching centralized with no radiation. Pt received 324 of Aspirin, 2 nitro tabs, and 100cc of NS on transport. Pt denies SOB and dizziness. EMS found prolonged QT on rhythm strip and held zofran.

## 2016-07-09 NOTE — Progress Notes (Signed)
[name] Date of Birth: 1936/02/21   History of Present Illness: [name] is seen for post hospital follow up.  She has a history of SVT and PVCs. She is on chronic metoprolol therapy. She has  PMR and is on chronic steroids. She had remote Echo in 2005 that showed inferior HK. Subsequent Adenosine Myoview was normal with normal EF. She also has chronic urinary incontinence with associated chronic wound involving the left medial thigh extending to the left gluteal fold, COPD and former tobacco use, hypertension, hypothyroidism, chronic back pain on MS Contin, depression, dyslipidemia, and MGUS.   She was recently admitted 10/26-11/09/2015 with UTI, chest pain, and Afib with RVR. She was managed with rate control and anticoagulated on Xarelto. Peak troponin 0.07. She converted to NSR and was DC on Toprol XL 150 mg daily. Echo prior in February 2017 was normal. UTI was treated with antibiotics. Synthroid dose recently decreased due to low TSH.   On follow up today she still feels very weak. No energy. No tachycardia or palpitations. No dyspnea or chest pain. She has lost 27 lbs since August. Appetite is poor. She states she has not taken Xarelto because it makes her stomach hurt. She does have chronic ischemic changes in her left 3rd and 5th toes since July. LE arterial dopplers showed adequate perfusion for healing.  Current Outpatient Prescriptions on File Prior to Visit  Medication Sig Dispense Refill  . levothyroxine (SYNTHROID, LEVOTHROID) 88 MCG tablet Take 1 tablet (88 mcg total) by mouth daily. 30 tablet 1  . meclizine (ANTIVERT) 25 MG tablet TAKE 1 TABLET (25 MG TOTAL) BY MOUTH 3 (THREE) TIMES DAILY AS NEEDED FOR DIZZINESS. 30 tablet 2  . metoprolol succinate (TOPROL-XL) 50 MG 24 hr tablet Take 3 tablets (150 mg total) by mouth daily. 90 tablet 0  . morphine (MS CONTIN) 15 MG 12 hr tablet Take 1 tablet by mouth daily.    [name] morphine (MS CONTIN) 30 MG 12 hr tablet Take 1 tablet by  mouth daily.    . pantoprazole (PROTONIX) 40 MG tablet TAKE 1 TABLET (40 MG TOTAL) BY MOUTH DAILY. 90 tablet 3  . predniSONE (DELTASONE) 1 MG tablet TAKE 4 TABLETS BY MOUTH DAILLY IN THE MORNING IN AUGUST, 3 DAILY SEPTEMBER, 2 DAILY OCTOBER (Patient taking differently: Take 1 tablet once daily.) 360 tablet 0  . PRESCRIPTION MEDICATION Apply 1 application topically 3 (three) times daily. Triam Silver    . sertraline (ZOLOFT) 50 MG tablet TAKE 1 TABLET BY MOUTH EVERY DAY 90 tablet 3  . trolamine salicylate (ASPERCREME) 10 % cream Apply 1 application topically 2 (two) times daily as needed for muscle pain.     No current facility-administered medications on file prior to visit.     Allergies  Allergen Reactions  . Amoxicillin-Pot Clavulanate     REACTION: diarrhea  . Aspirin Nausea And Vomiting  . Atorvastatin     REACTION: feel weak  . Carisoprodol     REACTION: intolerant  . Codeine     REACTION: hallucinations  . Codeine Phosphate     REACTION: UNKNOWN  . Cortisone     REACTION: pt. reports allergy, reaction not known  . Duoderm Hydroactive     Skin irritation and pain     . Ezetimibe-Simvastatin     REACTION: feels weak  . Naproxen Sodium     REACTION: GI symptoms  . Omeprazole     REACTION: abd pain  . Quinapril Hcl  REACTION: reports allergy, reaction not known  . Rofecoxib     REACTION: reports allergy, reaction not known  . Silvadene [Silver Sulfadiazine]     Skin irritation and pain     Past Medical History:  Diagnosis Date  . Chronic pain    Managed by Dr. Hardin Negus  . Diverticulitis   . Hyperlipidemia   . Hypertension   . IBS (irritable bowel syndrome)   . Palpitations   . Polymyalgia rheumatica ([place])   . PVC's (premature ventricular contractions)   . SVT (supraventricular tachycardia) ([place])   . Tobacco dependence     Past Surgical History:  Procedure Laterality Date  . NASAL SINUS SURGERY    . REPLACEMENT TOTAL KNEE    . TONSILLECTOMY    . WRIST  FRACTURE SURGERY      History  Smoking Status  . Former Smoker  . Packs/day: 0.50  . Quit date: 12/12/2011  Smokeless Tobacco  . Never Used    History  Alcohol Use No    Family History  Problem Relation Age of Onset  . Diabetes Mother   . Heart attack Father   . Heart disease Sister     Review of Systems: The review of systems is as above.   All other systems were reviewed and are negative.  Physical Exam: BP 100/60 (BP Location: Right Wrist, Patient Position: Sitting, Cuff Size: Normal)   Pulse 90   Ht 5\' 7"  (1.702 m)   Wt 215 lb (97.5 kg)   BMI 33.67 kg/m  Patient is in no acute distress.  She is obese. Skin is warm and dry. Color is normal.  HEENT is unremarkable. Normocephalic/atraumatic. PERRL. Sclera are nonicteric. Neck is supple. No masses. No JVD. Lungs are clear. Cardiac exam shows a regular rate and rhythm. Normal S1-2 without gallop or murmur.  Abdomen is soft and obese. Extremities reveal no edema.  She has dry gangrene left 3rd and 5th toes with bluish discoloration and pain on palpation. DP and PT pulses are palpable. She walks with a cane.  No gross neurologic deficits noted.  LABORATORY DATA:   Lab Results  Component Value Date   WBC 10.1 07/04/2016   HGB 10.2 (L) 07/04/2016   HCT 33.5 (L) 07/04/2016   PLT 346 07/04/2016   GLUCOSE 106 (H) 07/04/2016   CHOL 112 06/18/2016   TRIG 56 06/18/2016   HDL 38 (L) 06/18/2016   LDLDIRECT 136.4 05/24/2012   LDLCALC 63 06/18/2016   ALT 16 06/21/2016   AST 33 06/21/2016   NA 138 07/04/2016   K 3.6 07/04/2016   CL 104 07/04/2016   CREATININE 0.80 07/04/2016   BUN 16 07/04/2016   CO2 24 07/04/2016   TSH 0.019 (L) 06/17/2016   INR 1.00 12/10/2010   HGBA1C 5.7 (H) 06/17/2016   Echo: 09/25/15: Study Conclusions  - Left ventricle: The cavity size was normal. Wall thickness was   normal. Systolic function was normal. The estimated ejection   fraction was in the range of 55% to 60%. Although no diagnostic    regional wall motion abnormality was identified, this possibility   cannot be completely excluded on the basis of this study. Left   ventricular diastolic function parameters were normal. - Mitral valve: Calcified annulus.    Assessment / Plan: 1. Supraventricular tachycardia. Well controlled on her current dose of metoprolol. We will continue same dose and followup again in 6 months.  2. PVCs. Asymptomatic  3.  Atrial fibrillation - paroxysmal. In setting  of UTI and low TSH. Converted to NSR. Now on anticoagulation with Xarelto. Intolerant due to GI side effects. Will switch to Eliquis 5 mg bid. If unable to tolerate this will need to start coumadin.   4. HTN controlled. In fact BP is low. This may be contributing to fatigue. Will DC aldactone.  5. Hypothyroidism. Recent dose change on Synthroid. Follow up TSH.  6. PMR. On steroids.  7. 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 Stable. Dopplers indicate adequate perfusion for healing. Monitor for infection.

## 2016-07-12 ENCOUNTER — Encounter: Payer: Self-pay | Admitting: Cardiology

## 2016-07-12 ENCOUNTER — Other Ambulatory Visit: Payer: 59

## 2016-07-12 ENCOUNTER — Other Ambulatory Visit: Payer: Self-pay | Admitting: Family Medicine

## 2016-07-12 ENCOUNTER — Ambulatory Visit (INDEPENDENT_AMBULATORY_CARE_PROVIDER_SITE_OTHER): Payer: 59 | Admitting: Cardiology

## 2016-07-12 VITALS — BP 100/60 | HR 90 | Ht 67.0 in | Wt 215.0 lb

## 2016-07-12 DIAGNOSIS — I4891 Unspecified atrial fibrillation: Secondary | ICD-10-CM

## 2016-07-12 DIAGNOSIS — I10 Essential (primary) hypertension: Secondary | ICD-10-CM | POA: Diagnosis not present

## 2016-07-12 DIAGNOSIS — E7800 Pure hypercholesterolemia, unspecified: Secondary | ICD-10-CM

## 2016-07-12 DIAGNOSIS — I471 Supraventricular tachycardia: Secondary | ICD-10-CM | POA: Diagnosis not present

## 2016-07-12 LAB — COMPREHENSIVE METABOLIC PANEL
ALT: 23 U/L (ref 6–29)
AST: 20 U/L (ref 10–35)
Albumin: 3.3 g/dL — ABNORMAL LOW (ref 3.6–5.1)
Alkaline Phosphatase: 129 U/L (ref 33–130)
BUN: 16 mg/dL (ref 7–25)
CHLORIDE: 99 mmol/L (ref 98–110)
CO2: 30 mmol/L (ref 20–31)
Calcium: 9.5 mg/dL (ref 8.6–10.4)
Creat: 0.72 mg/dL (ref 0.60–0.93)
Glucose, Bld: 94 mg/dL (ref 65–99)
POTASSIUM: 3.5 mmol/L (ref 3.5–5.3)
Sodium: 135 mmol/L (ref 135–146)
TOTAL PROTEIN: 6.4 g/dL (ref 6.1–8.1)
Total Bilirubin: 0.6 mg/dL (ref 0.2–1.2)

## 2016-07-12 LAB — TSH: TSH: 24.57 m[IU]/L — AB

## 2016-07-12 MED ORDER — APIXABAN 2.5 MG PO TABS
5.0000 mg | ORAL_TABLET | Freq: Two times a day (BID) | ORAL | Status: DC
Start: 2016-07-12 — End: 2016-07-18

## 2016-07-12 NOTE — Patient Instructions (Signed)
Stop taking Xarelto and Aldactone  Start Eliquis 5 mg twice a day  I will see you in 3 months.

## 2016-07-18 ENCOUNTER — Other Ambulatory Visit: Payer: Self-pay | Admitting: Cardiology

## 2016-07-18 ENCOUNTER — Other Ambulatory Visit: Payer: Self-pay | Admitting: Physician Assistant

## 2016-07-18 ENCOUNTER — Telehealth: Payer: Self-pay | Admitting: Cardiology

## 2016-07-18 DIAGNOSIS — I4821 Permanent atrial fibrillation: Secondary | ICD-10-CM

## 2016-07-18 MED ORDER — APIXABAN 2.5 MG PO TABS: 5.0000 mg | ORAL_TABLET | Freq: Two times a day (BID) | ORAL | Status: DC

## 2016-07-18 NOTE — Telephone Encounter (Signed)
Received a call from patient. Reports she was recently switched from Xarelto to Eliquis at her last office visit due to GI upset. States she was given samples in the office, but has misplaced them. Called her pharmacy but did not have a prescription there. I informed her I would send in a new Rx and that she should not miss any additional doses.

## 2016-07-19 ENCOUNTER — Inpatient Hospital Stay (HOSPITAL_COMMUNITY)
Admission: EM | Admit: 2016-07-19 | Discharge: 2016-07-28 | DRG: 175 | Disposition: A | Discharge status: Home Health | Payer: 59 | Attending: Internal Medicine | Admitting: Internal Medicine

## 2016-07-19 ENCOUNTER — Emergency Department (HOSPITAL_COMMUNITY): Payer: 59

## 2016-07-19 ENCOUNTER — Encounter (HOSPITAL_COMMUNITY): Payer: Self-pay | Admitting: Emergency Medicine

## 2016-07-19 DIAGNOSIS — M353 Polymyalgia rheumatica: Secondary | ICD-10-CM | POA: Diagnosis present

## 2016-07-19 DIAGNOSIS — G9341 Metabolic encephalopathy: Secondary | ICD-10-CM | POA: Diagnosis not present

## 2016-07-19 DIAGNOSIS — K828 Other specified diseases of gallbladder: Secondary | ICD-10-CM | POA: Diagnosis present

## 2016-07-19 DIAGNOSIS — R112 Nausea with vomiting, unspecified: Secondary | ICD-10-CM

## 2016-07-19 DIAGNOSIS — K580 Irritable bowel syndrome with diarrhea: Secondary | ICD-10-CM | POA: Diagnosis not present

## 2016-07-19 DIAGNOSIS — C787 Secondary malignant neoplasm of liver and intrahepatic bile duct: Secondary | ICD-10-CM | POA: Diagnosis not present

## 2016-07-19 DIAGNOSIS — R911 Solitary pulmonary nodule: Secondary | ICD-10-CM | POA: Diagnosis present

## 2016-07-19 DIAGNOSIS — C801 Malignant (primary) neoplasm, unspecified: Secondary | ICD-10-CM | POA: Diagnosis not present

## 2016-07-19 DIAGNOSIS — Z833 Family history of diabetes mellitus: Secondary | ICD-10-CM

## 2016-07-19 DIAGNOSIS — F419 Anxiety disorder, unspecified: Secondary | ICD-10-CM | POA: Diagnosis present

## 2016-07-19 DIAGNOSIS — F329 Major depressive disorder, single episode, unspecified: Secondary | ICD-10-CM | POA: Diagnosis present

## 2016-07-19 DIAGNOSIS — E785 Hyperlipidemia, unspecified: Secondary | ICD-10-CM | POA: Diagnosis present

## 2016-07-19 DIAGNOSIS — I2699 Other pulmonary embolism without acute cor pulmonale: Secondary | ICD-10-CM | POA: Diagnosis not present

## 2016-07-19 DIAGNOSIS — I4891 Unspecified atrial fibrillation: Secondary | ICD-10-CM

## 2016-07-19 DIAGNOSIS — D6869 Other thrombophilia: Secondary | ICD-10-CM | POA: Diagnosis not present

## 2016-07-19 DIAGNOSIS — Z79891 Long term (current) use of opiate analgesic: Secondary | ICD-10-CM

## 2016-07-19 DIAGNOSIS — M1990 Unspecified osteoarthritis, unspecified site: Secondary | ICD-10-CM | POA: Diagnosis present

## 2016-07-19 DIAGNOSIS — I482 Chronic atrial fibrillation: Secondary | ICD-10-CM | POA: Diagnosis present

## 2016-07-19 DIAGNOSIS — C221 Intrahepatic bile duct carcinoma: Secondary | ICD-10-CM | POA: Diagnosis not present

## 2016-07-19 DIAGNOSIS — Z515 Encounter for palliative care: Secondary | ICD-10-CM | POA: Diagnosis not present

## 2016-07-19 DIAGNOSIS — Z7901 Long term (current) use of anticoagulants: Secondary | ICD-10-CM | POA: Diagnosis not present

## 2016-07-19 DIAGNOSIS — J449 Chronic obstructive pulmonary disease, unspecified: Secondary | ICD-10-CM | POA: Diagnosis present

## 2016-07-19 DIAGNOSIS — D649 Anemia, unspecified: Secondary | ICD-10-CM | POA: Diagnosis not present

## 2016-07-19 DIAGNOSIS — Z7189 Other specified counseling: Secondary | ICD-10-CM

## 2016-07-19 DIAGNOSIS — Z6833 Body mass index (BMI) 33.0-33.9, adult: Secondary | ICD-10-CM | POA: Diagnosis not present

## 2016-07-19 DIAGNOSIS — Z8249 Family history of ischemic heart disease and other diseases of the circulatory system: Secondary | ICD-10-CM | POA: Diagnosis not present

## 2016-07-19 DIAGNOSIS — Z79899 Other long term (current) drug therapy: Secondary | ICD-10-CM | POA: Diagnosis not present

## 2016-07-19 DIAGNOSIS — K589 Irritable bowel syndrome without diarrhea: Secondary | ICD-10-CM | POA: Diagnosis present

## 2016-07-19 DIAGNOSIS — Z87891 Personal history of nicotine dependence: Secondary | ICD-10-CM

## 2016-07-19 DIAGNOSIS — E039 Hypothyroidism, unspecified: Secondary | ICD-10-CM | POA: Diagnosis present

## 2016-07-19 DIAGNOSIS — Z66 Do not resuscitate: Secondary | ICD-10-CM | POA: Diagnosis not present

## 2016-07-19 DIAGNOSIS — D472 Monoclonal gammopathy: Secondary | ICD-10-CM | POA: Diagnosis not present

## 2016-07-19 DIAGNOSIS — I2782 Chronic pulmonary embolism: Secondary | ICD-10-CM | POA: Diagnosis not present

## 2016-07-19 DIAGNOSIS — M549 Dorsalgia, unspecified: Secondary | ICD-10-CM | POA: Diagnosis present

## 2016-07-19 DIAGNOSIS — D638 Anemia in other chronic diseases classified elsewhere: Secondary | ICD-10-CM | POA: Diagnosis present

## 2016-07-19 DIAGNOSIS — I639 Cerebral infarction, unspecified: Secondary | ICD-10-CM

## 2016-07-19 DIAGNOSIS — R1011 Right upper quadrant pain: Secondary | ICD-10-CM | POA: Diagnosis not present

## 2016-07-19 DIAGNOSIS — R2990 Unspecified symptoms and signs involving the nervous system: Secondary | ICD-10-CM | POA: Diagnosis not present

## 2016-07-19 DIAGNOSIS — M6289 Other specified disorders of muscle: Secondary | ICD-10-CM | POA: Diagnosis not present

## 2016-07-19 DIAGNOSIS — G894 Chronic pain syndrome: Secondary | ICD-10-CM | POA: Diagnosis not present

## 2016-07-19 DIAGNOSIS — Z7401 Bed confinement status: Secondary | ICD-10-CM

## 2016-07-19 DIAGNOSIS — R079 Chest pain, unspecified: Secondary | ICD-10-CM | POA: Diagnosis not present

## 2016-07-19 DIAGNOSIS — F32A Depression, unspecified: Secondary | ICD-10-CM | POA: Diagnosis present

## 2016-07-19 DIAGNOSIS — I2609 Other pulmonary embolism with acute cor pulmonale: Secondary | ICD-10-CM | POA: Diagnosis not present

## 2016-07-19 DIAGNOSIS — I10 Essential (primary) hypertension: Secondary | ICD-10-CM | POA: Diagnosis not present

## 2016-07-19 DIAGNOSIS — G9340 Encephalopathy, unspecified: Secondary | ICD-10-CM | POA: Diagnosis not present

## 2016-07-19 DIAGNOSIS — R531 Weakness: Secondary | ICD-10-CM

## 2016-07-19 DIAGNOSIS — G939 Disorder of brain, unspecified: Secondary | ICD-10-CM | POA: Diagnosis not present

## 2016-07-19 DIAGNOSIS — R740 Nonspecific elevation of levels of transaminase and lactic acid dehydrogenase [LDH]: Secondary | ICD-10-CM | POA: Diagnosis not present

## 2016-07-19 DIAGNOSIS — E46 Unspecified protein-calorie malnutrition: Secondary | ICD-10-CM | POA: Diagnosis not present

## 2016-07-19 DIAGNOSIS — G8929 Other chronic pain: Secondary | ICD-10-CM | POA: Diagnosis present

## 2016-07-19 DIAGNOSIS — R160 Hepatomegaly, not elsewhere classified: Secondary | ICD-10-CM

## 2016-07-19 DIAGNOSIS — R195 Other fecal abnormalities: Secondary | ICD-10-CM | POA: Diagnosis not present

## 2016-07-19 DIAGNOSIS — C772 Secondary and unspecified malignant neoplasm of intra-abdominal lymph nodes: Secondary | ICD-10-CM | POA: Diagnosis not present

## 2016-07-19 DIAGNOSIS — G459 Transient cerebral ischemic attack, unspecified: Secondary | ICD-10-CM | POA: Diagnosis not present

## 2016-07-19 DIAGNOSIS — C23 Malignant neoplasm of gallbladder: Secondary | ICD-10-CM | POA: Diagnosis not present

## 2016-07-19 DIAGNOSIS — J810 Acute pulmonary edema: Secondary | ICD-10-CM | POA: Diagnosis not present

## 2016-07-19 LAB — PROTIME-INR
INR: 1.05
Prothrombin Time: 13.7 seconds (ref 11.4–15.2)

## 2016-07-19 LAB — CBC
HCT: 27.5 % — ABNORMAL LOW (ref 36.0–46.0)
Hemoglobin: 8.6 g/dL — ABNORMAL LOW (ref 12.0–15.0)
MCH: 27.8 pg (ref 26.0–34.0)
MCHC: 31.3 g/dL (ref 30.0–36.0)
MCV: 89 fL (ref 78.0–100.0)
PLATELETS: 278 10*3/uL (ref 150–400)
RBC: 3.09 MIL/uL — AB (ref 3.87–5.11)
RDW: 18.1 % — AB (ref 11.5–15.5)
WBC: 7.3 10*3/uL (ref 4.0–10.5)

## 2016-07-19 LAB — CBC WITH DIFFERENTIAL/PLATELET
BASOS PCT: 0 %
Basophils Absolute: 0 10*3/uL (ref 0.0–0.1)
EOS ABS: 0.1 10*3/uL (ref 0.0–0.7)
Eosinophils Relative: 1 %
HCT: 26.5 % — ABNORMAL LOW (ref 36.0–46.0)
Hemoglobin: 8.5 g/dL — ABNORMAL LOW (ref 12.0–15.0)
LYMPHS ABS: 1.3 10*3/uL (ref 0.7–4.0)
Lymphocytes Relative: 18 %
MCH: 28 pg (ref 26.0–34.0)
MCHC: 32.1 g/dL (ref 30.0–36.0)
MCV: 87.2 fL (ref 78.0–100.0)
MONO ABS: 0.6 10*3/uL (ref 0.1–1.0)
MONOS PCT: 9 %
Neutro Abs: 5.1 10*3/uL (ref 1.7–7.7)
Neutrophils Relative %: 72 %
Platelets: 261 10*3/uL (ref 150–400)
RBC: 3.04 MIL/uL — ABNORMAL LOW (ref 3.87–5.11)
RDW: 17.7 % — AB (ref 11.5–15.5)
WBC: 7.1 10*3/uL (ref 4.0–10.5)

## 2016-07-19 LAB — COMPREHENSIVE METABOLIC PANEL
ALBUMIN: 3.1 g/dL — AB (ref 3.5–5.0)
ALK PHOS: 98 U/L (ref 38–126)
ALT: 13 U/L — AB (ref 14–54)
AST: 21 U/L (ref 15–41)
Anion gap: 8 (ref 5–15)
BILIRUBIN TOTAL: 0.7 mg/dL (ref 0.3–1.2)
BUN: 16 mg/dL (ref 6–20)
CALCIUM: 9.6 mg/dL (ref 8.9–10.3)
CO2: 25 mmol/L (ref 22–32)
CREATININE: 0.77 mg/dL (ref 0.44–1.00)
Chloride: 102 mmol/L (ref 101–111)
GFR calc Af Amer: >60 mL/min (ref >60)
GFR calc non Af Amer: >60 mL/min (ref >60)
GLUCOSE: 124 mg/dL — AB (ref 65–99)
Potassium: 4 mmol/L (ref 3.5–5.1)
Sodium: 135 mmol/L (ref 135–145)
TOTAL PROTEIN: 6.9 g/dL (ref 6.5–8.1)

## 2016-07-19 LAB — URINALYSIS, ROUTINE W REFLEX MICROSCOPIC
BILIRUBIN URINE: NEGATIVE
Glucose, UA: NEGATIVE mg/dL
HGB URINE DIPSTICK: NEGATIVE
KETONES UR: NEGATIVE mg/dL
Leukocytes, UA: NEGATIVE
NITRITE: NEGATIVE
PH: 5.5 (ref 5.0–8.0)
Protein, ur: NEGATIVE mg/dL
SPECIFIC GRAVITY, URINE: 1.027 (ref 1.005–1.030)

## 2016-07-19 LAB — HEPARIN LEVEL (UNFRACTIONATED): HEPARIN UNFRACTIONATED: 0.54 [IU]/mL (ref 0.30–0.70)

## 2016-07-19 LAB — LIPASE, BLOOD: Lipase: 18 U/L (ref 11–51)

## 2016-07-19 LAB — POC OCCULT BLOOD, ED: Fecal Occult Bld: POSITIVE — AB

## 2016-07-19 LAB — APTT: aPTT: 32 seconds (ref 24–36)

## 2016-07-19 MED ORDER — SODIUM CHLORIDE 0.9% FLUSH: 3.0000 mL | INTRAVENOUS | Status: DC | PRN

## 2016-07-19 MED ORDER — MECLIZINE HCL 25 MG PO TABS
25.0000 mg | ORAL_TABLET | Freq: Three times a day (TID) | ORAL | Status: DC | PRN
  Filled 2016-07-19: qty 1

## 2016-07-19 MED ORDER — SODIUM CHLORIDE 0.9% FLUSH
3.0000 mL | Freq: Two times a day (BID) | INTRAVENOUS | Status: DC
  Administered 2016-07-19 – 2016-07-27 (×11): 3 mL via INTRAVENOUS

## 2016-07-19 MED ORDER — SODIUM CHLORIDE 0.9 % IV SOLN
250.0000 mL | INTRAVENOUS | Status: DC | PRN
  Administered 2016-07-21 (×2): 250 mL via INTRAVENOUS

## 2016-07-19 MED ORDER — MORPHINE SULFATE ER 15 MG PO TBCR
15.0000 mg | EXTENDED_RELEASE_TABLET | Freq: Every day | ORAL | Status: DC
  Administered 2016-07-20 – 2016-07-23 (×4): 15 mg via ORAL
  Filled 2016-07-19 (×4): qty 1

## 2016-07-19 MED ORDER — LEVOTHYROXINE SODIUM 88 MCG PO TABS
88.0000 ug | ORAL_TABLET | Freq: Every day | ORAL | Status: DC
  Administered 2016-07-20 – 2016-07-28 (×9): 88 ug via ORAL
  Filled 2016-07-19 (×9): qty 1

## 2016-07-19 MED ORDER — IOPAMIDOL (ISOVUE-370) INJECTION 76%
100.0000 mL | Freq: Once | INTRAVENOUS | Status: AC | PRN
  Administered 2016-07-19: 80 mL via INTRAVENOUS

## 2016-07-19 MED ORDER — IOPAMIDOL (ISOVUE-300) INJECTION 61%
30.0000 mL | Freq: Once | INTRAVENOUS | Status: DC | PRN
  Administered 2016-07-19: 30 mL via ORAL
  Filled 2016-07-19: qty 30

## 2016-07-19 MED ORDER — METOPROLOL SUCCINATE ER 50 MG PO TB24
150.0000 mg | ORAL_TABLET | Freq: Every day | ORAL | Status: DC
  Administered 2016-07-20 – 2016-07-28 (×8): 150 mg via ORAL
  Filled 2016-07-19 (×10): qty 1

## 2016-07-19 MED ORDER — HEPARIN (PORCINE) IN NACL 100-0.45 UNIT/ML-% IJ SOLN
1100.0000 [IU]/h | INTRAMUSCULAR | Status: DC
  Administered 2016-07-19: 1300 [IU]/h via INTRAVENOUS
  Filled 2016-07-19 (×2): qty 250

## 2016-07-19 MED ORDER — IOPAMIDOL (ISOVUE-300) INJECTION 61%
100.0000 mL | Freq: Once | INTRAVENOUS | Status: AC | PRN
  Administered 2016-07-19: 100 mL via INTRAVENOUS

## 2016-07-19 MED ORDER — IOPAMIDOL (ISOVUE-300) INJECTION 61%
INTRAVENOUS | Status: AC
Start: 2016-07-19 — End: 2016-07-19
  Filled 2016-07-19: qty 30

## 2016-07-19 MED ORDER — SODIUM CHLORIDE 0.9% FLUSH
3.0000 mL | Freq: Two times a day (BID) | INTRAVENOUS | Status: DC
  Administered 2016-07-21 – 2016-07-28 (×8): 3 mL via INTRAVENOUS

## 2016-07-19 MED ORDER — OXYCODONE HCL 5 MG PO TABS
5.0000 mg | ORAL_TABLET | Freq: Four times a day (QID) | ORAL | Status: DC | PRN
  Administered 2016-07-19 – 2016-07-21 (×7): 5 mg via ORAL
  Filled 2016-07-19 (×7): qty 1

## 2016-07-19 MED ORDER — MORPHINE SULFATE (PF) 4 MG/ML IV SOLN
4.0000 mg | Freq: Once | INTRAVENOUS | Status: AC
  Administered 2016-07-19: 4 mg via INTRAVENOUS
  Filled 2016-07-19: qty 1

## 2016-07-19 MED ORDER — PANTOPRAZOLE SODIUM 40 MG PO TBEC
40.0000 mg | DELAYED_RELEASE_TABLET | Freq: Every day | ORAL | Status: DC
  Administered 2016-07-20: 40 mg via ORAL
  Filled 2016-07-19: qty 1

## 2016-07-19 MED ORDER — SERTRALINE HCL 50 MG PO TABS
50.0000 mg | ORAL_TABLET | Freq: Every day | ORAL | Status: DC
  Administered 2016-07-19 – 2016-07-28 (×10): 50 mg via ORAL
  Filled 2016-07-19 (×11): qty 1

## 2016-07-19 NOTE — ED Notes (Signed)
Patient transported to CT 

## 2016-07-19 NOTE — ED Provider Notes (Signed)
[facility] DEPT Provider Note   CSN: [id number] Arrival date & time: 07/19/16  [id number]     History   Chief Complaint Chief Complaint  Patient presents with  . Abdominal Pain    HPI [name] is a 80 y.o. female.  Patient is 80 yo F with PMH of chronic pain, diverticulitis, HLP, HTN, IBS, palpitations, and polymyalgia rheumatica, presenting with chief complaint of right-sided abdominal pain starting at 2:00 AM today. Pain was constant, described as sharp, but improved after taking one 30 mg PO morphine prescribed by her pain management specialist. The pain did not radiate to her back. She states she had complete hysterectomy and appendectomy, but still has her gallbladder. Pain was not made worse after eating, and she reports no change in appetite. She denies any fevers, chills, N/V/D, change in bowel habits, blood in stools, or dysuria. She also denies any chest pain, palpitations, shortness of breath, cough, back pain, or syncope.      Past Medical History:  Diagnosis Date  . Chronic pain    Managed by Dr. Hardin Negus  . Diverticulitis   . Hyperlipidemia   . Hypertension   . IBS (irritable bowel syndrome)   . Palpitations   . Polymyalgia rheumatica ([place])   . PVC's (premature ventricular contractions)   . SVT (supraventricular tachycardia) ([place])   . Tobacco dependence     Patient Active Problem List   Diagnosis Date Noted  . Sepsis secondary to UTI ([place])   . Klebsiella pneumoniae infection   . Escherichia coli infection   . Pressure injury of skin 06/18/2016  . SIRS (systemic inflammatory response syndrome) ([place]) 06/17/2016  . Atrial fibrillation with rapid ventricular response ([place]) 06/17/2016  . Anemia 06/17/2016  . Chest pain 06/17/2016  . Lymphedema of arm 04/02/2016  . Open wound of left thigh/chronic wound left gluteal fold 04/02/2016  . Urinary incontinence 04/02/2016  . Foot ulcer ([place]) 01/13/2016  . Pedal edema 11/27/2015  . MGUS (monoclonal  gammopathy of unknown significance) 11/18/2015  . Liver lesion 11/18/2015  . Leg edema, left 09/22/2015  . Microscopic hematuria 05/21/2014  . PMR (polymyalgia rheumatica) ([place]) 05/15/2014  . Elevated sed rate 04/30/2014  . UTI (urinary tract infection) 04/30/2014  . Diverticulosis 04/17/2014  . Former smoker 12/12/2012  . Depression 04/19/2011  . Obesity 10/03/2007  . VERTIGO 04/11/2007  . Hyperthyroidism 12/27/2006  . HYPERCHOLESTEROLEMIA 12/27/2006  . Essential hypertension 12/27/2006  . History of SVT (supraventricular tachycardia) ([place]) 12/27/2006  . ALLERGIC RHINITIS 12/27/2006  . COPD (chronic obstructive pulmonary disease) ([place]) 12/27/2006  . OSTEOARTHRITIS 12/27/2006  . Chronic back pain 12/27/2006    Past Surgical History:  Procedure Laterality Date  . NASAL SINUS SURGERY    . REPLACEMENT TOTAL KNEE    . TONSILLECTOMY    . WRIST FRACTURE SURGERY      OB History    No data available       Home Medications    Prior to Admission medications   Medication Sig Start Date End Date Taking? Authorizing Provider  levothyroxine (SYNTHROID, LEVOTHROID) 88 MCG tablet Take 1 tablet (88 mcg total) by mouth daily. 06/25/16   Abner Greenspan, MD  meclizine (ANTIVERT) 25 MG tablet TAKE 1 TABLET (25 MG TOTAL) BY MOUTH 3 (THREE) TIMES DAILY AS NEEDED FOR DIZZINESS. 03/14/15   Abner Greenspan, MD  metoprolol succinate (TOPROL-XL) 50 MG 24 hr tablet Take 3 tablets (150 mg total) by mouth daily. 06/24/16   Costin Karlyne Greenspan, MD  morphine (  MS CONTIN) 15 MG 12 hr tablet Take 1 tablet by mouth daily. 01/02/16   Historical Provider, MD  morphine (MS CONTIN) 30 MG 12 hr tablet Take 1 tablet by mouth daily. 01/02/16   Historical Provider, MD  pantoprazole (PROTONIX) 40 MG tablet TAKE 1 TABLET (40 MG TOTAL) BY MOUTH DAILY. 08/26/15   Wynelle Fanny Tower, MD  predniSONE (DELTASONE) 1 MG tablet TAKE 4 TABLETS BY MOUTH DAILLY IN THE MORNING IN [place], 3 DAILY SEPTEMBER, 2 DAILY OCTOBER Patient taking  differently: Take 1 tablet once daily. 06/23/16   Bo Merino, MD  PRESCRIPTION MEDICATION Apply 1 application topically 3 (three) times daily. Triam Silver    Historical Provider, MD  sertraline (ZOLOFT) 50 MG tablet TAKE 1 TABLET BY MOUTH EVERY DAY 03/08/16   Abner Greenspan, MD  trolamine salicylate (ASPERCREME) 10 % cream Apply 1 application topically 2 (two) times daily as needed for muscle pain.    Historical Provider, MD    Family History Family History  Problem Relation Age of Onset  . Diabetes Mother   . Heart attack Father   . Heart disease Sister     Social History Social History  Substance Use Topics  . Smoking status: Former Smoker    Packs/day: 0.50    Quit date: 12/12/2011  . Smokeless tobacco: Never Used  . Alcohol use No     Allergies   Amoxicillin-pot clavulanate; Aspirin; Atorvastatin; Carisoprodol; Codeine; Codeine phosphate; Cortisone; Duoderm hydroactive; Ezetimibe-simvastatin; Naproxen sodium; Omeprazole; Quinapril hcl; Rofecoxib; and Silvadene [silver sulfadiazine]   Review of Systems Review of Systems  All other systems reviewed and are negative.    Physical Exam Updated Vital Signs BP 143/59 (BP Location: Left Arm)   Pulse 84   Temp 98.3 F (36.8 C) (Oral)   Resp 20   Ht 5\' 7"  (1.702 m)   Wt 96.6 kg   SpO2 96%   BMI 33.36 kg/m   Physical Exam  Constitutional:  Obese female, lying comfortably in bed, no acute distress.  HENT:  Head: Normocephalic and atraumatic.  Mouth/Throat: Oropharynx is clear and moist.  Eyes: Conjunctivae are normal.  Neck: Normal range of motion.  Cardiovascular: Normal rate, regular rhythm, normal heart sounds and intact distal pulses.   Pulmonary/Chest: Effort normal and breath sounds normal. No respiratory distress.  Abdominal: Soft. Bowel sounds are normal. She exhibits no distension. There is tenderness. There is no guarding.  Mild TTP RUQ and epigastric region only. Negative Murphy's.  Musculoskeletal:  Normal range of motion. She exhibits no edema or tenderness.  Neurological: She is alert.  Skin: Skin is warm and dry.  Psychiatric: She has a normal mood and affect.  Nursing note and vitals reviewed.    ED Treatments / Results  Labs (all labs ordered are listed, but only abnormal results are displayed) Labs Reviewed  COMPREHENSIVE METABOLIC PANEL - Abnormal; Notable for the following:       Result Value   Glucose, Bld 124 (*)    Albumin 3.1 (*)    ALT 13 (*)    All other components within normal limits  CBC WITH DIFFERENTIAL/PLATELET - Abnormal; Notable for the following:    RBC 3.04 (*)    Hemoglobin 8.5 (*)    HCT 26.5 (*)    RDW 17.7 (*)    All other components within normal limits  LIPASE, BLOOD  URINALYSIS, ROUTINE W REFLEX MICROSCOPIC (NOT AT [facility])    EKG  EKG Interpretation None  Radiology Ct Angio Chest Pe W And/or Wo Contrast  Result Date: 07/19/2016 CLINICAL DATA:  Abnormal CT abdomen and pelvis.  Abdominal pain. EXAM: CT ANGIOGRAPHY CHEST WITH CONTRAST TECHNIQUE: Multidetector CT imaging of the chest was performed using the standard protocol during bolus administration of intravenous contrast. Multiplanar CT image reconstructions and MIPs were obtained to evaluate the vascular anatomy. CONTRAST:  100 cc Isovue 370 COMPARISON:  07/04/2016 FINDINGS: Cardiovascular: There is low-density filling defect within the right pulmonary artery segmental branch of the lateral basal segment of the right lower lobe. It extends into subsegmental branches. This is compatible with pulmonary thromboembolism. There is also segmental and subsegmental pulmonary thromboembolism at the base of the right middle lobe. There are no central filling defects in the pulmonary arterial tree. No evidence of left pulmonary thromboembolism. Coronary artery calcifications. Mediastinum/Nodes: No abnormal mediastinal adenopathy. Right infrahilar lymphoid tissue there is stable and mildly  prominent. Lungs/Pleura: Advanced apical centrilobular emphysema. There is a questionable nodular density in the superior segment of the left lower lobe on image 42 of series 14, measuring 1.0 cm. There is scattered volume loss in the lungs. Dependent atelectasis bilaterally. No pneumothorax. No pleural effusion. Upper Abdomen: Simple cysts in the left kidney. Musculoskeletal: No vertebral compression deformity. Mild scoliosis of the thoracic spine. Review of the MIP images confirms the above findings. IMPRESSION: The study is positive for acute pulmonary thromboembolism in the right middle and lower lobes. This affects segmental and subsegmental branches. Possible 1.0 cm left lower lobe pulmonary nodule. Electronically Signed   By: Marybelle Killings M.D.   On: 07/19/2016 13:36   Ct Abdomen Pelvis W Contrast  Result Date: 07/19/2016 CLINICAL DATA:  Abdominal pain EXAM: CT ABDOMEN AND PELVIS WITH CONTRAST TECHNIQUE: Multidetector CT imaging of the abdomen and pelvis was performed using the standard protocol following bolus administration of intravenous contrast. Oral contrast was also administered. CONTRAST:  167mL ISOVUE-300 IOPAMIDOL (ISOVUE-300) INJECTION 61% COMPARISON:  CT abdomen and pelvis July 24, 2014 FINDINGS: Lower chest: There is bibasilar lung atelectasis. Heart is mildly enlarged. There is a focal area of decreased attenuation in the region of the left ventricle seen on axial slice 1 series 2 measuring 1.5 x 1.4 cm. There is a questionable pulmonary embolus in a right lower lobe pulmonary artery seen on axial slice 1 series 2. Hepatobiliary: There is an infiltrative appearing mass in the anterior segment of the right lobe of the liver measuring 7.7 x 4.9 cm. No other focal liver lesion is evident. There is extensive sludge in the gallbladder. An underlying mass in the gallbladder cannot be excluded. The gallbladder wall appears rather prominent by CT. There is no appreciable biliary duct dilatation.  Pancreas: No pancreatic mass or inflammatory focus evident. Spleen: No splenic lesions are evident. Adrenals/Urinary Tract: Adrenals appear unremarkable bilaterally. There are two cysts arising from the upper pole of the left kidney eccentrically measuring just over 1 cm in size each. No non cystic liver lesions are evident. There is no hydronephrosis on either side. There is fetal lobulation on each side, an anatomic variant. There is no appreciable renal or ureteral calculus on either side. Urinary bladder is midline with wall thickness within normal limits. Stomach/Bowel: There are scattered colonic diverticula, primarily in the sigmoid region without diverticulitis. There is fairly diffuse stool throughout much of the colon. There is a right-sided femoral hernia which contains small bowel but no bowel compromise. There is no bowel dilatation or air-fluid level suggesting bowel obstruction. No free air  or portal venous air is evident. Vascular/Lymphatic: There is extensive atherosclerotic calcification in the aorta and major pelvic arterial vessels diffusely. No aneurysms are evident. Major mesenteric arterial vessels appear patent with moderate atherosclerotic calcification noted, particularly at the origin of the left renal artery. There is periportal region adenopathy. The largest of the periportal lymph nodes measures 4.4 x 2.3 cm. There is no adenopathy distant to the periportal region. Reproductive: Uterus appears absent. No pelvic mass or pelvic fluid collection evident. Other: Appendix not appreciable. No periappendiceal region inflammation. No abscess or ascites evident in the abdomen or pelvis. Musculoskeletal: There is extensive degenerative change in the lumbar spine. There is mild anterolisthesis of L4 on L5 and mild anterolisthesis of L5 on S1, findings present previously. There are no blastic or lytic bone lesions. There is no intramuscular or abdominal wall lesion. IMPRESSION: Large liver lesion  felt to represent neoplastic focus. There is extensive periportal adenopathy. The gallbladder contains sludge with questionable mass within the gallbladder. Gallbladder wall appears borderline thickened. These findings raise concern for possible carcinoma arising from the gallbladder with liver metastasis and periportal adenopathy. Primary liver carcinoma is a differential consideration. Given the proclivity of colon carcinoma to metastasize to the liver, it may be prudent to consider direct visualization of the colon in this regard. Questionable pulmonary embolus in a right lower lobe pulmonary artery branch. This finding may warrant CT angiogram of the chest for further assessment. Questionable lesion in the left ventricle. This finding may warrant echocardiography to further assess. There is extensive aortic atherosclerosis as well as atherosclerosis throughout the major pelvic vessels. No aneurysm evident. Right-sided femoral hernia containing small bowel but no bowel compromise. Critical Value/emergent results were called by telephone at the time of interpretation on 07/19/2016 at 10:09 am to [place], [place] , who verbally acknowledged these results. Electronically Signed   By: Lowella Grip III M.D.   On: 07/19/2016 10:10    Procedures Procedures (including critical care time)  Medications Ordered in ED Medications - No data to display   Initial Impression / Assessment and Plan / ED Course  I have reviewed the triage vital signs and the nursing notes.  Pertinent labs & imaging results that were available during my care of the patient were reviewed by me and considered in my medical decision making (see chart for details).  Clinical Course    Patient is 80 yo F presenting with right-sided abdominal pain starting at 2:00 AM today. CT abdomen ordered, which showed evidence of possible carcinoma arising from the gallbladder with possible liver metastasis. Radiologist, Dr. Lowella Grip, also called about questionable pulmonary embolism, and CT angio chest ordered, which confirmed PE in right middle and lower lobes. Mild anemia of 8.5 noted on CBC, down from baseline of 9.5. Given findings, hemoccult ordered, which was positive. Pharmacy consulted for appropriate dosing of heparin. Consult also placed to hospitalist team for admission, and Dr. Velvet Bathe agreed to admit patient to inpatient telemetry unit. Patient notified of findings and agreeable to plan for admission.  Final Clinical Impressions(s) / ED Diagnoses   Final diagnoses:  Other pulmonary embolism without acute cor pulmonale, unspecified chronicity ([place])  Right upper quadrant abdominal pain    New Prescriptions New Prescriptions   No medications on file     Rosilyn Mings II, [place] 07/19/16 1515    Orpah Greek, MD 07/20/16 [phone]

## 2016-07-19 NOTE — ED Notes (Signed)
Bed: [id number] Expected date:  Expected time:  Means of arrival:  Comments: 39 f lower abdominal pain

## 2016-07-19 NOTE — ED Notes (Signed)
Pt cannot use restroom at this time, aware urine specimen is needed.  

## 2016-07-19 NOTE — H&P (Signed)
History and Physical    [name] [id number] DOB: [birth year] DOA: 07/19/2016  PCP: [name], MD  Patient coming from: home  Chief Complaint: abdominal discomfort  HPI: [name] is a 80 y.o. female with medical history significant of atrial fibrillation, SVT, chronic pain, polymyalgia rheumatica, irritable bowel syndrome presented to the hospital complaining of abdominal discomfort. This is been going on for the last 2 or 3 days. The pain has been persistent. Nothing she is aware of makes it better or worse. Patient currently denies any hemoptysis. The discomfort is described as sharp, constant, gradually getting worse.  ED Course: Patient had abdominal CT scan which make mention of liver lesions as well as gallbladder lesions and noted findings suspicious for PE of which afterwards CT angiogram was obtained which confirmed a PE.  Review of Systems: As per HPI otherwise 10 point review of systems negative.    Past Medical History:  Diagnosis Date  . Chronic pain    Managed by Dr. Hardin Negus  . Diverticulitis   . Hyperlipidemia   . Hypertension   . IBS (irritable bowel syndrome)   . Palpitations   . Polymyalgia rheumatica ([place])   . PVC's (premature ventricular contractions)   . SVT (supraventricular tachycardia) ([place])   . Tobacco dependence     Past Surgical History:  Procedure Laterality Date  . NASAL SINUS SURGERY    . REPLACEMENT TOTAL KNEE    . TONSILLECTOMY    . WRIST FRACTURE SURGERY       reports that she quit smoking about 4 years ago. She smoked 0.50 packs per day. She has never used smokeless tobacco. She reports that she does not drink alcohol or use drugs.  Allergies  Allergen Reactions  . Amoxicillin-Pot Clavulanate Diarrhea    Has patient had a PCN reaction causing immediate rash, facial/tongue/throat swelling, SOB or lightheadedness with hypotension: No Has patient had a PCN reaction causing severe rash involving mucus membranes or skin  necrosis: No Has patient had a PCN reaction that required hospitalization No Has patient had a PCN reaction occurring within the last 10 years: Yes If all of the above answers are "NO", then may proceed with Cephalosporin use.   . Aspirin Nausea And Vomiting  . Atorvastatin     REACTION: feel weak  . Carisoprodol     REACTION: intolerant  . Codeine     REACTION: hallucinations  . Codeine Phosphate     REACTION: UNKNOWN  . Cortisone     REACTION: pt. reports allergy, reaction not known  . Duoderm Hydroactive     Skin irritation and pain     . Ezetimibe-Simvastatin     REACTION: feels weak  . Naproxen Sodium     REACTION: GI symptoms  . Omeprazole     REACTION: abd pain  . Quinapril Hcl     REACTION: reports allergy, reaction not known  . Rofecoxib     REACTION: reports allergy, reaction not known  . Silvadene [Silver Sulfadiazine]     Skin irritation and pain   . Xarelto [Rivaroxaban] Other (See Comments)    Pain     Family History  Problem Relation Age of Onset  . Diabetes Mother   . Heart attack Father   . Heart disease Sister     Prior to Admission medications   Medication Sig Start Date End Date Taking? Authorizing Provider  levothyroxine (SYNTHROID, LEVOTHROID) 88 MCG tablet Take 1 tablet (88 mcg total) by mouth daily. 06/25/16  Yes Abner Greenspan, MD  meclizine (ANTIVERT) 25 MG tablet TAKE 1 TABLET (25 MG TOTAL) BY MOUTH 3 (THREE) TIMES DAILY AS NEEDED FOR DIZZINESS. 03/14/15  Yes Abner Greenspan, MD  metoprolol succinate (TOPROL-XL) 50 MG 24 hr tablet Take 3 tablets (150 mg total) by mouth daily. 06/24/16  Yes Costin Karlyne Greenspan, MD  morphine (MS CONTIN) 15 MG 12 hr tablet Take 1 tablet by mouth daily with breakfast.  01/02/16  Yes Historical Provider, MD  morphine (MS CONTIN) 30 MG 12 hr tablet Take 1 tablet by mouth at bedtime.  01/02/16  Yes Historical Provider, MD  pantoprazole (PROTONIX) 40 MG tablet TAKE 1 TABLET (40 MG TOTAL) BY MOUTH DAILY. 08/26/15  Yes [place], MD  predniSONE (DELTASONE) 1 MG tablet TAKE 4 TABLETS BY MOUTH DAILLY IN THE MORNING IN [place], 3 DAILY SEPTEMBER, 2 DAILY OCTOBER Patient taking differently: Take 1 tablet once daily. 06/23/16  Yes Bo Merino, MD  sertraline (ZOLOFT) 50 MG tablet TAKE 1 TABLET BY MOUTH EVERY DAY Patient taking differently: TAKE 1 TABLET BY MOUTH EVERY DAY as needed for depression 03/08/16  Yes Abner Greenspan, MD  trolamine salicylate (ASPERCREME) 10 % cream Apply 1 application topically 2 (two) times daily as needed for muscle pain.   Yes Historical Provider, MD    Physical Exam: Vitals:   07/19/16 1211 07/19/16 1333 07/19/16 1504 07/19/16 1556  BP: 116/60 (!) 109/53 107/63 92/55  Pulse: 72 76 78 73  Resp:  12 12 13   Temp:      TempSrc:      SpO2: 93% 98% 100% 99%  Weight:      Height:        Constitutional: NAD, calm, comfortable,  Vitals:   07/19/16 1211 07/19/16 1333 07/19/16 1504 07/19/16 1556  BP: 116/60 (!) 109/53 107/63 92/55  Pulse: 72 76 78 73  Resp:  12 12 13   Temp:      TempSrc:      SpO2: 93% 98% 100% 99%  Weight:      Height:       Eyes: PERRL, lids and conjunctivae normal ENMT: Mucous membranes are moist. Posterior pharynx clear of any exudate or lesions.  Neck: normal, supple, no masses, no thyromegaly Respiratory: no wheezes, equal chest rise, poor inspiratory effort Cardiovascular: Irregular rate and rhythm, no murmurs / rubs / gallops.  Abdomen: no tenderness, no masses palpated. No hepatosplenomegaly. Bowel sounds positive.  Musculoskeletal: no clubbing / cyanosis. No joint deformity upper and lower extremities. Skin: no rashes, lesions, ulcers. No induration Neurologic: CN 3-12 grossly intact. Sensation intact, Strength 5/5 in all 4.  Psychiatric: Normal judgment and insight. Alert and oriented x 3. Normal mood.    Labs on Admission: I have personally reviewed following labs and imaging studies  CBC:  Recent Labs Lab 07/19/16 0705  WBC 7.1  NEUTROABS  5.1  HGB 8.5*  HCT 26.5*  MCV 87.2  PLT 0000000   Basic Metabolic Panel:  Recent Labs Lab 07/19/16 0705  NA 135  K 4.0  CL 102  CO2 25  GLUCOSE 124*  BUN 16  CREATININE 0.77  CALCIUM 9.6   GFR: Estimated Creatinine Clearance: 69.2 mL/min (by C-G formula based on SCr of 0.77 mg/dL). Liver Function Tests:  Recent Labs Lab 07/19/16 0705  AST 21  ALT 13*  ALKPHOS 98  BILITOT 0.7  PROT 6.9  ALBUMIN 3.1*    Recent Labs Lab 07/19/16 0705  LIPASE 18   No  results for input(s): AMMONIA in the last 168 hours. Coagulation Profile:  Recent Labs Lab 07/19/16 1516  INR 1.05   Cardiac Enzymes: No results for input(s): CKTOTAL, CKMB, CKMBINDEX, TROPONINI in the last 168 hours. BNP (last 3 results)  Recent Labs  09/11/15 1200  PROBNP 326.0*   HbA1C: No results for input(s): HGBA1C in the last 72 hours. CBG: No results for input(s): GLUCAP in the last 168 hours. Lipid Profile: No results for input(s): CHOL, HDL, LDLCALC, TRIG, CHOLHDL, LDLDIRECT in the last 72 hours. Thyroid Function Tests: No results for input(s): TSH, T4TOTAL, FREET4, T3FREE, THYROIDAB in the last 72 hours. Anemia Panel: No results for input(s): VITAMINB12, FOLATE, FERRITIN, TIBC, IRON, RETICCTPCT in the last 72 hours. Urine analysis:    Component Value Date/Time   COLORURINE YELLOW 07/19/2016 [place] 07/19/2016 1037   LABSPEC 1.027 07/19/2016 1037   LABSPEC 1.025 07/22/2015 1216   PHURINE 5.5 07/19/2016 1037   GLUCOSEU NEGATIVE 07/19/2016 1037   GLUCOSEU Negative 07/22/2015 1216   HGBUR NEGATIVE 07/19/2016 1037   HGBUR trace-lysed 12/27/2006 1459   BILIRUBINUR NEGATIVE 07/19/2016 1037   BILIRUBINUR Negative 07/22/2015 1216   KETONESUR NEGATIVE 07/19/2016 1037   PROTEINUR NEGATIVE 07/19/2016 1037   UROBILINOGEN 0.2 07/22/2015 1216   NITRITE NEGATIVE 07/19/2016 1037   LEUKOCYTESUR NEGATIVE 07/19/2016 1037   LEUKOCYTESUR Negative 07/22/2015 1216   Sepsis Labs:  !!!!!!!!!!!!!!!!!!!!!!!!!!!!!!!!!!!!!!!!!!!! @LABRCNTIP (procalcitonin:4,lacticidven:4) )No results found for this or any previous visit (from the past 240 hour(s)).   Radiological Exams on Admission: Ct Angio Chest Pe W And/or Wo Contrast  Result Date: 07/19/2016 CLINICAL DATA:  Abnormal CT abdomen and pelvis.  Abdominal pain. EXAM: CT ANGIOGRAPHY CHEST WITH CONTRAST TECHNIQUE: Multidetector CT imaging of the chest was performed using the standard protocol during bolus administration of intravenous contrast. Multiplanar CT image reconstructions and MIPs were obtained to evaluate the vascular anatomy. CONTRAST:  100 cc Isovue 370 COMPARISON:  07/04/2016 FINDINGS: Cardiovascular: There is low-density filling defect within the right pulmonary artery segmental branch of the lateral basal segment of the right lower lobe. It extends into subsegmental branches. This is compatible with pulmonary thromboembolism. There is also segmental and subsegmental pulmonary thromboembolism at the base of the right middle lobe. There are no central filling defects in the pulmonary arterial tree. No evidence of left pulmonary thromboembolism. Coronary artery calcifications. Mediastinum/Nodes: No abnormal mediastinal adenopathy. Right infrahilar lymphoid tissue there is stable and mildly prominent. Lungs/Pleura: Advanced apical centrilobular emphysema. There is a questionable nodular density in the superior segment of the left lower lobe on image 42 of series 14, measuring 1.0 cm. There is scattered volume loss in the lungs. Dependent atelectasis bilaterally. No pneumothorax. No pleural effusion. Upper Abdomen: Simple cysts in the left kidney. Musculoskeletal: No vertebral compression deformity. Mild scoliosis of the thoracic spine. Review of the MIP images confirms the above findings. IMPRESSION: The study is positive for acute pulmonary thromboembolism in the right middle and lower lobes. This affects segmental and subsegmental  branches. Possible 1.0 cm left lower lobe pulmonary nodule. Electronically Signed   By: Marybelle Killings M.D.   On: 07/19/2016 13:36   Ct Abdomen Pelvis W Contrast  Result Date: 07/19/2016 CLINICAL DATA:  Abdominal pain EXAM: CT ABDOMEN AND PELVIS WITH CONTRAST TECHNIQUE: Multidetector CT imaging of the abdomen and pelvis was performed using the standard protocol following bolus administration of intravenous contrast. Oral contrast was also administered. CONTRAST:  175mL ISOVUE-300 IOPAMIDOL (ISOVUE-300) INJECTION 61% COMPARISON:  CT abdomen and pelvis July 24, 2014 FINDINGS: Lower chest: There is bibasilar lung atelectasis. Heart is mildly enlarged. There is a focal area of decreased attenuation in the region of the left ventricle seen on axial slice 1 series 2 measuring 1.5 x 1.4 cm. There is a questionable pulmonary embolus in a right lower lobe pulmonary artery seen on axial slice 1 series 2. Hepatobiliary: There is an infiltrative appearing mass in the anterior segment of the right lobe of the liver measuring 7.7 x 4.9 cm. No other focal liver lesion is evident. There is extensive sludge in the gallbladder. An underlying mass in the gallbladder cannot be excluded. The gallbladder wall appears rather prominent by CT. There is no appreciable biliary duct dilatation. Pancreas: No pancreatic mass or inflammatory focus evident. Spleen: No splenic lesions are evident. Adrenals/Urinary Tract: Adrenals appear unremarkable bilaterally. There are two cysts arising from the upper pole of the left kidney eccentrically measuring just over 1 cm in size each. No non cystic liver lesions are evident. There is no hydronephrosis on either side. There is fetal lobulation on each side, an anatomic variant. There is no appreciable renal or ureteral calculus on either side. Urinary bladder is midline with wall thickness within normal limits. Stomach/Bowel: There are scattered colonic diverticula, primarily in the sigmoid region  without diverticulitis. There is fairly diffuse stool throughout much of the colon. There is a right-sided femoral hernia which contains small bowel but no bowel compromise. There is no bowel dilatation or air-fluid level suggesting bowel obstruction. No free air or portal venous air is evident. Vascular/Lymphatic: There is extensive atherosclerotic calcification in the aorta and major pelvic arterial vessels diffusely. No aneurysms are evident. Major mesenteric arterial vessels appear patent with moderate atherosclerotic calcification noted, particularly at the origin of the left renal artery. There is periportal region adenopathy. The largest of the periportal lymph nodes measures 4.4 x 2.3 cm. There is no adenopathy distant to the periportal region. Reproductive: Uterus appears absent. No pelvic mass or pelvic fluid collection evident. Other: Appendix not appreciable. No periappendiceal region inflammation. No abscess or ascites evident in the abdomen or pelvis. Musculoskeletal: There is extensive degenerative change in the lumbar spine. There is mild anterolisthesis of L4 on L5 and mild anterolisthesis of L5 on S1, findings present previously. There are no blastic or lytic bone lesions. There is no intramuscular or abdominal wall lesion. IMPRESSION: Large liver lesion felt to represent neoplastic focus. There is extensive periportal adenopathy. The gallbladder contains sludge with questionable mass within the gallbladder. Gallbladder wall appears borderline thickened. These findings raise concern for possible carcinoma arising from the gallbladder with liver metastasis and periportal adenopathy. Primary liver carcinoma is a differential consideration. Given the proclivity of colon carcinoma to metastasize to the liver, it may be prudent to consider direct visualization of the colon in this regard. Questionable pulmonary embolus in a right lower lobe pulmonary artery branch. This finding may warrant CT angiogram  of the chest for further assessment. Questionable lesion in the left ventricle. This finding may warrant echocardiography to further assess. There is extensive aortic atherosclerosis as well as atherosclerosis throughout the major pelvic vessels. No aneurysm evident. Right-sided femoral hernia containing small bowel but no bowel compromise. Critical Value/emergent results were called by telephone at the time of interpretation on 07/19/2016 at 10:09 am to [place], PA , who verbally acknowledged these results. Electronically Signed   By: Lowella Grip III M.D.   On: 07/19/2016 10:10     Assessment/Plan Active Problems:  Pulmonary embolism (HCC) - Anticoagulated with heparin - Patient did not fell outpatient anticoagulation therapy as she was not taking it - Monitor on telemetry  Liver/gallbladder mass - favor outpatient work up at this time given principle problem requiring anticoagulation    Essential hypertension - Continue metoprolol    COPD (chronic obstructive pulmonary disease) ([place]) - No wheezes, stable    Osteoarthritis   Chronic back pain - Continue home medication regimen    VERTIGO - We'll continue meclizine. Stable    Depression - Stable on sertraline will continue    Atrial fibrillation (HCC) - Continue beta blocker - Patient was unable to afford her anticoagulant as an outpatient as such developed principal problem most likely    DVT prophylaxis: Heparin dosed for PE by pharmacy Code Status: full Family Communication: d/c patient directly Disposition Plan: inpatient Consults called: none Admission status: telemetry   Velvet Bathe MD [facility] Pager [phone]  If 7PM-7AM, please contact night-coverage [email or web address] Password [email or web address]  07/19/2016, 4:18 PM

## 2016-07-19 NOTE — ED Notes (Signed)
DRINKING ORAL CT CONTRAST

## 2016-07-19 NOTE — Progress Notes (Addendum)
ANTICOAGULATION CONSULT NOTE - Initial Consult  Pharmacy Consult for IV heparin Indication: PE  Allergies  Allergen Reactions  . Amoxicillin-Pot Clavulanate Diarrhea    Has patient had a PCN reaction causing immediate rash, facial/tongue/throat swelling, SOB or lightheadedness with hypotension: No Has patient had a PCN reaction causing severe rash involving mucus membranes or skin necrosis: No Has patient had a PCN reaction that required hospitalization No Has patient had a PCN reaction occurring within the last 10 years: Yes If all of the above answers are "NO", then may proceed with Cephalosporin use.   . Aspirin Nausea And Vomiting  . Atorvastatin     REACTION: feel weak  . Carisoprodol     REACTION: intolerant  . Codeine     REACTION: hallucinations  . Codeine Phosphate     REACTION: UNKNOWN  . Cortisone     REACTION: pt. reports allergy, reaction not known  . Duoderm Hydroactive     Skin irritation and pain     . Ezetimibe-Simvastatin     REACTION: feels weak  . Naproxen Sodium     REACTION: GI symptoms  . Omeprazole     REACTION: abd pain  . Quinapril Hcl     REACTION: reports allergy, reaction not known  . Rofecoxib     REACTION: reports allergy, reaction not known  . Silvadene [Silver Sulfadiazine]     Skin irritation and pain   . Xarelto [Rivaroxaban] Other (See Comments)    Pain     Patient Measurements: Height: 5\' 7"  (170.2 cm) Weight: 213 lb (96.6 kg) IBW/kg (Calculated) : 61.6 Heparin Dosing Weight: 82.9kg  Vital Signs: Temp: 98.3 F (36.8 C) (11/27 0615) Temp Source: Oral (11/27 0615) BP: 109/53 (11/27 1333) Pulse Rate: 76 (11/27 1333)  Labs:  Recent Labs  07/19/16 0705  HGB 8.5*  HCT 26.5*  PLT 261  CREATININE 0.77    Estimated Creatinine Clearance: 69.2 mL/min (by C-G formula based on SCr of 0.77 mg/dL).   Medical History: Past Medical History:  Diagnosis Date  . Chronic pain    Managed by Dr. Hardin Negus  . Diverticulitis   .  Hyperlipidemia   . Hypertension   . IBS (irritable bowel syndrome)   . Palpitations   . Polymyalgia rheumatica ([place])   . PVC's (premature ventricular contractions)   . SVT (supraventricular tachycardia) ([place])   . Tobacco dependence     Medications:  Pt recently discharged 11/2 from hospital on Xarelto for Afib which pt did not tolerate due to GI side effects, then changed to apixaban on 11/20 by cardiologist but reports she never started it due to cost.  Please note pt is poor historian  Assessment: 54 yoF with PMHx polymyalgia rheumatica, chronic pain, IBS, diverticulitis, afib with SVT and PVCs, HTN/HLD, anemia, COPD and obesity presents with right sided abdominal pain.  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  Pharmacy consulted to start IV heparin.  Discussed case with PA as pt has positive FOB and hemoglobin as dropped 10.2 to 8.5 in the last two weeks.  Will begin very conservatively with heparin and omit bolus.    11/27:  Hgb 8.5, Plts 261 SCr = 0.77, CrCl ~69 ml/min  Goal of Therapy:  Heparin level 0.3-0.7 units/ml Monitor platelets by anticoagulation protocol: Yes   Plan:  No bolus heparin Start heparin infusion 1300 units/hr = 13 ml/hr Check baseline aPTT, PT/INR, and HL  Check first HL in 8 hours  [employer]  Nyari Olsson, PharmD, BCPS 07/19/2016, 3:12 PM  Pager: [phone]  Addendum: Baseline HL came back @ 0.54 which may indicate pt has been taking DOAC?  Baseline PT/INR and aPTT WNl.  Will add on aPTT with next HL check.    Ralene Bathe, PharmD, BCPS 07/19/2016, 5:42 PM  Pager: [phone]

## 2016-07-19 NOTE — ED Notes (Signed)
AWARE OF NEED FOR URINE SAMPLE 

## 2016-07-19 NOTE — ED Triage Notes (Signed)
Brought in by EMS from home with c/o abdominal pain.  Pt reports that she woke up to lower abdominal pain this morning at 0200.   Pt denies N/V/D.

## 2016-07-19 NOTE — ED Notes (Signed)
2LNC APPLIED FOR SUPPORT. PT HAS NO SYMPTOMS. PT TOLERATED

## 2016-07-19 NOTE — Progress Notes (Signed)
PCP: [name], MD  Admit date: 06/17/2016 Discharge date: 06/24/2016  Admitted From: home Disposition:  home  Home Health: PT, RN via [facility]   ED CM notified home health in house staff of pt admission to be followed for d/c needs

## 2016-07-20 DIAGNOSIS — I10 Essential (primary) hypertension: Secondary | ICD-10-CM

## 2016-07-20 DIAGNOSIS — R1011 Right upper quadrant pain: Secondary | ICD-10-CM

## 2016-07-20 DIAGNOSIS — R160 Hepatomegaly, not elsewhere classified: Secondary | ICD-10-CM

## 2016-07-20 DIAGNOSIS — R195 Other fecal abnormalities: Secondary | ICD-10-CM

## 2016-07-20 DIAGNOSIS — I482 Chronic atrial fibrillation: Secondary | ICD-10-CM

## 2016-07-20 DIAGNOSIS — D638 Anemia in other chronic diseases classified elsewhere: Secondary | ICD-10-CM

## 2016-07-20 DIAGNOSIS — D649 Anemia, unspecified: Secondary | ICD-10-CM

## 2016-07-20 LAB — CBC
HCT: 26.4 % — ABNORMAL LOW (ref 36.0–46.0)
HEMATOCRIT: 25.1 % — AB (ref 36.0–46.0)
HEMATOCRIT: 25.7 % — AB (ref 36.0–46.0)
HEMOGLOBIN: 7.9 g/dL — AB (ref 12.0–15.0)
HEMOGLOBIN: 8.1 g/dL — AB (ref 12.0–15.0)
HEMOGLOBIN: 8.4 g/dL — AB (ref 12.0–15.0)
MCH: 28 pg (ref 26.0–34.0)
MCH: 27.4 pg (ref 26.0–34.0)
MCH: 27.6 pg (ref 26.0–34.0)
MCHC: 31.5 g/dL (ref 30.0–36.0)
MCHC: 31.5 g/dL (ref 30.0–36.0)
MCHC: 31.8 g/dL (ref 30.0–36.0)
MCV: 86.8 fL (ref 78.0–100.0)
MCV: 86.8 fL (ref 78.0–100.0)
MCV: 89 fL (ref 78.0–100.0)
Platelets: 271 10*3/uL (ref 150–400)
Platelets: 293 10*3/uL (ref 150–400)
Platelets: 298 10*3/uL (ref 150–400)
RBC: 2.82 MIL/uL — AB (ref 3.87–5.11)
RBC: 2.96 MIL/uL — ABNORMAL LOW (ref 3.87–5.11)
RBC: 3.04 MIL/uL — AB (ref 3.87–5.11)
RDW: 17.9 % — AB (ref 11.5–15.5)
RDW: 18 % — ABNORMAL HIGH (ref 11.5–15.5)
RDW: 18.1 % — ABNORMAL HIGH (ref 11.5–15.5)
WBC: 7.7 10*3/uL (ref 4.0–10.5)
WBC: 8.3 10*3/uL (ref 4.0–10.5)
WBC: 7.8 10*3/uL (ref 4.0–10.5)

## 2016-07-20 LAB — BASIC METABOLIC PANEL
ANION GAP: 4 — AB (ref 5–15)
BUN: 12 mg/dL (ref 6–20)
CHLORIDE: 101 mmol/L (ref 101–111)
CO2: 28 mmol/L (ref 22–32)
Calcium: 8.9 mg/dL (ref 8.9–10.3)
Creatinine, Ser: 0.87 mg/dL (ref 0.44–1.00)
GFR calc Af Amer: >60 mL/min (ref >60)
GFR calc non Af Amer: >60 mL/min (ref >60)
GLUCOSE: 109 mg/dL — AB (ref 65–99)
POTASSIUM: 3.9 mmol/L (ref 3.5–5.1)
Sodium: 133 mmol/L — ABNORMAL LOW (ref 135–145)

## 2016-07-20 LAB — APTT
APTT: 93 s — AB (ref 24–36)
aPTT: 133 seconds — ABNORMAL HIGH (ref 24–36)
aPTT: 68 seconds — ABNORMAL HIGH (ref 24–36)

## 2016-07-20 LAB — HEPARIN LEVEL (UNFRACTIONATED)
HEPARIN UNFRACTIONATED: 0.58 [IU]/mL (ref 0.30–0.70)
Heparin Unfractionated: 1.02 IU/mL — ABNORMAL HIGH (ref 0.30–0.70)
Heparin Unfractionated: 0.94 IU/mL — ABNORMAL HIGH (ref 0.30–0.70)

## 2016-07-20 MED ORDER — PANTOPRAZOLE SODIUM 40 MG PO TBEC
40.0000 mg | DELAYED_RELEASE_TABLET | Freq: Two times a day (BID) | ORAL | Status: DC
  Administered 2016-07-20 – 2016-07-28 (×15): 40 mg via ORAL
  Filled 2016-07-20 (×16): qty 1

## 2016-07-20 MED ORDER — OXYCODONE HCL 5 MG PO TABS
5.0000 mg | ORAL_TABLET | Freq: Once | ORAL | Status: AC
  Administered 2016-07-20: 5 mg via ORAL
  Filled 2016-07-20: qty 1

## 2016-07-20 MED ORDER — HEPARIN (PORCINE) IN NACL 100-0.45 UNIT/ML-% IJ SOLN
900.0000 [IU]/h | INTRAMUSCULAR | Status: DC
Start: 2016-07-20 — End: 2016-07-23
  Administered 2016-07-20 – 2016-07-22 (×3): 900 [IU]/h via INTRAVENOUS
  Filled 2016-07-20 (×2): qty 250

## 2016-07-20 MED ORDER — MORPHINE SULFATE ER 30 MG PO TBCR
30.0000 mg | EXTENDED_RELEASE_TABLET | Freq: Every day | ORAL | Status: DC
  Administered 2016-07-20 – 2016-07-23 (×4): 30 mg via ORAL
  Filled 2016-07-20 (×4): qty 1

## 2016-07-20 MED ORDER — PREDNISONE 1 MG PO TABS
1.0000 mg | ORAL_TABLET | Freq: Every day | ORAL | Status: DC
  Administered 2016-07-20 – 2016-07-28 (×9): 1 mg via ORAL
  Filled 2016-07-20 (×9): qty 1

## 2016-07-20 NOTE — Progress Notes (Signed)
ANTICOAGULATION CONSULT NOTE - Follow Up Consult  Pharmacy Consult for Heparin Indication: pulmonary embolus  Allergies  Allergen Reactions  . Amoxicillin-Pot Clavulanate Diarrhea    Has patient had a PCN reaction causing immediate rash, facial/tongue/throat swelling, SOB or lightheadedness with hypotension: No Has patient had a PCN reaction causing severe rash involving mucus membranes or skin necrosis: No Has patient had a PCN reaction that required hospitalization No Has patient had a PCN reaction occurring within the last 10 years: Yes If all of the above answers are "NO", then may proceed with Cephalosporin use.   . Aspirin Nausea And Vomiting  . Atorvastatin     REACTION: feel weak  . Carisoprodol     REACTION: intolerant  . Codeine     REACTION: hallucinations  . Codeine Phosphate     REACTION: UNKNOWN  . Cortisone     REACTION: pt. reports allergy, reaction not known  . Duoderm Hydroactive     Skin irritation and pain     . Ezetimibe-Simvastatin     REACTION: feels weak  . Naproxen Sodium     REACTION: GI symptoms  . Omeprazole     REACTION: abd pain  . Quinapril Hcl     REACTION: reports allergy, reaction not known  . Rofecoxib     REACTION: reports allergy, reaction not known  . Silvadene [Silver Sulfadiazine]     Skin irritation and pain   . Xarelto [Rivaroxaban] Other (See Comments)    Pain     Patient Measurements: Height: 5\' 7"  (170.2 cm) Weight: 213 lb (96.6 kg) IBW/kg (Calculated) : 61.6 Heparin Dosing Weight:   Vital Signs: Temp: 98.1 F (36.7 C) (11/27 2219) Temp Source: Oral (11/27 2219) BP: 140/81 (11/27 2219) Pulse Rate: 84 (11/27 2219)  Labs:  Recent Labs  07/19/16 0705 07/19/16 1516 07/19/16 1749 07/20/16 0058  HGB 8.5*  --  8.6* 7.9*  HCT 26.5*  --  27.5* 25.1*  PLT 261  --  278 271  APTT  --  32  --  133*  LABPROT  --  13.7  --   --   INR  --  1.05  --   --   HEPARINUNFRC  --  0.54  --  1.02*  CREATININE 0.77  --   --   0.87    Estimated Creatinine Clearance: 63.6 mL/min (by C-G formula based on SCr of 0.87 mg/dL).   Medications:  Infusions:  . heparin 1,300 Units/hr (07/19/16 1641)    Assessment: Patient with high heparin level and PTT.  No heparin issues per RN.  Goal of Therapy:  Heparin level 0.3-0.7 units/ml Monitor platelets by anticoagulation protocol: Yes   Plan:  Decrease heparin to 1100 units/hr Recheck PTT and heparin level at [address] [place] 07/20/2016,3:08 AM

## 2016-07-20 NOTE — Progress Notes (Signed)
Pt c/o pain. Medication not due until 1743. Provider contacted, order received for a 1 time dose Oxy IR 5 mg.

## 2016-07-20 NOTE — Progress Notes (Signed)
ANTICOAGULATION CONSULT NOTE - Follow Up Consult  Pharmacy Consult for Heparin Indication: pulmonary embolus  Allergies  Allergen Reactions  . Amoxicillin-Pot Clavulanate Diarrhea    Has patient had a PCN reaction causing immediate rash, facial/tongue/throat swelling, SOB or lightheadedness with hypotension: No Has patient had a PCN reaction causing severe rash involving mucus membranes or skin necrosis: No Has patient had a PCN reaction that required hospitalization No Has patient had a PCN reaction occurring within the last 10 years: Yes If all of the above answers are "NO", then may proceed with Cephalosporin use.   . Aspirin Nausea And Vomiting  . Atorvastatin     REACTION: feel weak  . Carisoprodol     REACTION: intolerant  . Codeine     REACTION: hallucinations  . Codeine Phosphate     REACTION: UNKNOWN  . Cortisone     REACTION: pt. reports allergy, reaction not known  . Duoderm Hydroactive     Skin irritation and pain     . Ezetimibe-Simvastatin     REACTION: feels weak  . Naproxen Sodium     REACTION: GI symptoms  . Omeprazole     REACTION: abd pain  . Quinapril Hcl     REACTION: reports allergy, reaction not known  . Rofecoxib     REACTION: reports allergy, reaction not known  . Silvadene [Silver Sulfadiazine]     Skin irritation and pain   . Xarelto [Rivaroxaban] Other (See Comments)    Pain     Patient Measurements: Height: 5\' 7"  (170.2 cm) Weight: 213 lb (96.6 kg) IBW/kg (Calculated) : 61.6 Heparin Dosing Weight: 83kg  Vital Signs: Temp: 98.7 F (37.1 C) (11/28 1355) Temp Source: Oral (11/28 1355) BP: 139/71 (11/28 1355) Pulse Rate: 72 (11/28 1355)  Labs:  Recent Labs  07/19/16 0705  07/19/16 1516  07/20/16 0058 07/20/16 1055 07/20/16 1718 07/20/16 2106  HGB 8.5*  --   --   < > 7.9* 8.4* 8.1*  --   HCT 26.5*  --   --   < > 25.1* 26.4* 25.7*  --   PLT 261  --   --   < > 271 293 298  --   APTT  --   < > 32  --  133* 93*  --  68*   LABPROT  --   --  13.7  --   --   --   --   --   INR  --   --  1.05  --   --   --   --   --   HEPARINUNFRC  --   < > 0.54  --  1.02* 0.94*  --  0.58  CREATININE 0.77  --   --   --  0.87  --   --   --   < > = values in this interval not displayed.  Estimated Creatinine Clearance: 63.6 mL/min (by C-G formula based on SCr of 0.87 mg/dL).   Medications:  Infusions:  . heparin 900 Units/hr (07/20/16 1237)    Assessment: 42 yoF with PMHx polymyalgia rheumatica, chronic pain, IBS, diverticulitis, afib with SVT and PVCs, HTN/HLD, anemia, COPD and obesity presents with right sided abdominal pain.  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  Pharmacy consulted to start IV heparin.  Discussed case with PA as pt has positive FOB and hemoglobin as dropped 10.2 to 8.5 in the last two weeks.  Will begin very conservatively  with heparin and omit bolus.    Patient was on xarelto but stopped d/t GI intolerance, Cardiology started her on Eliquis but unable to afford.  Patient states she has not be on anticoagulation for ~ 1 week.   Today, 07/20/2016:  Heparin level remains SUPRAtherapeutic (heparin at 1100 units/hr   APTT is within goal (upper end)   There should not be heparin level interference with DOAC as has been off for 1 week (baseline heparin level was elevated)  CBC: repeat Hgb improved, pltc WNL  FOBT was positive, GI to evaluate. Patient believes last colonscopy was ~ 69 y ago  No obvious bleeding  PM Update: Heparin level = 0.58 and aPTT = 68 sec with heparin infusing @ 900 units/hr (levels are therapeutic)  Goal of Therapy:  Heparin level 0.3-0.7 units/ml, Monitor platelets by anticoagulation protocol: Yes   Plan:   Continue heparin @ 900 units/hr  Daily CBC and heparin level    Leone Haven, PharmD 07/20/2016 9:52 PM

## 2016-07-20 NOTE — Consult Note (Signed)
Referring Provider: [facility]  Primary Care Physician:  [name], MD Primary Gastroenterologist:  Previously Erskine Emery, MD (2002)  Reason for Consultation:  Heme positive stools, anemia.   HPI: [name] is a 80 y.o. female who presented to ED yesterday with right sided abdominal pain. CTscan revealed liver and gallbladder lesions, also found to have a PE. She is anemic with heme positive stools.    Patient denies any overt GI blood loss. She takes no NSAIDS. No nausea. No bowel changes.  She has lost 20+ pounds since August due to poor appetite  No abdominal pain until acute onset of right sided abdominal pain a couple of days ago. This pain woke her up from sleep. Pain was nonradiating, mainly in right mid and RUQ. Pain was constant, she finally presented to ED.    Past Medical History:  Diagnosis Date  . Chronic pain    Managed by Dr. Hardin Negus  . Diverticulitis   . Hyperlipidemia   . Hypertension   . IBS (irritable bowel syndrome)   . Palpitations   . Polymyalgia rheumatica ([place])   . PVC's (premature ventricular contractions)   . SVT (supraventricular tachycardia) ([place])   . Tobacco dependence     Past Surgical History:  Procedure Laterality Date  . NASAL SINUS SURGERY    . REPLACEMENT TOTAL KNEE    . TONSILLECTOMY    . WRIST FRACTURE SURGERY      Prior to Admission medications   Medication Sig Start Date End Date Taking? Authorizing Provider  levothyroxine (SYNTHROID, LEVOTHROID) 88 MCG tablet Take 1 tablet (88 mcg total) by mouth daily. 06/25/16  Yes Abner Greenspan, MD  meclizine (ANTIVERT) 25 MG tablet TAKE 1 TABLET (25 MG TOTAL) BY MOUTH 3 (THREE) TIMES DAILY AS NEEDED FOR DIZZINESS. 03/14/15  Yes Abner Greenspan, MD  metoprolol succinate (TOPROL-XL) 50 MG 24 hr tablet Take 3 tablets (150 mg total) by mouth daily. 06/24/16  Yes Costin Karlyne Greenspan, MD  morphine (MS CONTIN) 15 MG 12 hr tablet Take 1 tablet by mouth daily with breakfast.  01/02/16  Yes  Historical Provider, MD  morphine (MS CONTIN) 30 MG 12 hr tablet Take 1 tablet by mouth at bedtime.  01/02/16  Yes Historical Provider, MD  pantoprazole (PROTONIX) 40 MG tablet TAKE 1 TABLET (40 MG TOTAL) BY MOUTH DAILY. 08/26/15  Yes Rio Pinar, MD  predniSONE (DELTASONE) 1 MG tablet TAKE 4 TABLETS BY MOUTH DAILLY IN THE MORNING IN [place], 3 DAILY SEPTEMBER, 2 DAILY OCTOBER Patient taking differently: Take 1 tablet once daily. 06/23/16  Yes Bo Merino, MD  sertraline (ZOLOFT) 50 MG tablet TAKE 1 TABLET BY MOUTH EVERY DAY Patient taking differently: TAKE 1 TABLET BY MOUTH EVERY DAY as needed for depression 03/08/16  Yes Abner Greenspan, MD  trolamine salicylate (ASPERCREME) 10 % cream Apply 1 application topically 2 (two) times daily as needed for muscle pain.   Yes Historical Provider, MD    Current Facility-Administered Medications  Medication Dose Route Frequency Provider Last Rate Last Dose  . 0.9 %  sodium chloride infusion  250 mL Intravenous PRN Velvet Bathe, MD      . heparin ADULT infusion 100 units/mL (25000 units/271mL sodium chloride 0.45%)  1,100 Units/hr Intravenous Continuous Velvet Bathe, MD 11 mL/hr at 07/20/16 0320 1,100 Units/hr at 07/20/16 0320  . levothyroxine (SYNTHROID, LEVOTHROID) tablet 88 mcg  88 mcg Oral QAC breakfast Velvet Bathe, MD   88 mcg at 07/20/16 0806  .  meclizine (ANTIVERT) tablet 25 mg  25 mg Oral TID PRN Velvet Bathe, MD      . metoprolol succinate (TOPROL-XL) 24 hr tablet 150 mg  150 mg Oral Daily Velvet Bathe, MD   150 mg at 07/20/16 1038  . morphine (MS CONTIN) 12 hr tablet 15 mg  15 mg Oral Q breakfast Velvet Bathe, MD   15 mg at 07/20/16 0806  . morphine (MS CONTIN) 12 hr tablet 30 mg  30 mg Oral QHS Robbie Lis, MD      . oxyCODONE (Oxy IR/ROXICODONE) immediate release tablet 5 mg  5 mg Oral Q6H PRN Velvet Bathe, MD   5 mg at 07/20/16 0534  . pantoprazole (PROTONIX) EC tablet 40 mg  40 mg Oral Daily Velvet Bathe, MD   40 mg at 07/20/16 1038  .  predniSONE (DELTASONE) tablet 1 mg  1 mg Oral Q breakfast Robbie Lis, MD      . sertraline (ZOLOFT) tablet 50 mg  50 mg Oral Daily Velvet Bathe, MD   50 mg at 07/20/16 1038  . sodium chloride flush (NS) 0.9 % injection 3 mL  3 mL Intravenous Q12H Velvet Bathe, MD   3 mL at 07/19/16 2200  . sodium chloride flush (NS) 0.9 % injection 3 mL  3 mL Intravenous Q12H Velvet Bathe, MD      . sodium chloride flush (NS) 0.9 % injection 3 mL  3 mL Intravenous PRN Velvet Bathe, MD        Allergies as of 07/19/2016 - Review Complete 07/19/2016  Allergen Reaction Noted  . Amoxicillin-pot clavulanate Diarrhea   . Aspirin Nausea And Vomiting 03/08/2011  . Atorvastatin  12/27/2006  . Carisoprodol  10/12/2007  . Codeine  12/27/2006  . Codeine phosphate  02/18/2006  . Cortisone  12/27/2006  . Duoderm hydroactive  06/11/2016  . Ezetimibe-simvastatin  12/27/2006  . Naproxen sodium  12/27/2006  . Omeprazole  11/11/2008  . Quinapril hcl  12/27/2006  . Rofecoxib  12/27/2006  . Silvadene [silver sulfadiazine]  06/11/2016  . Xarelto [rivaroxaban] Other (See Comments) 07/19/2016    Family History  Problem Relation Age of Onset  . Diabetes Mother   . Heart attack Father   . Heart disease Sister     Social History   Social History  . Marital status: Married    Spouse name: N/A  . Number of children: 2  . Years of education: N/A   Occupational History  .  Retired   Social History Main Topics  . Smoking status: Former Smoker    Packs/day: 0.50    Quit date: 12/12/2011  . Smokeless tobacco: Never Used  . Alcohol use No  . Drug use: No  . Sexual activity: Not Currently    Birth control/ protection: None   Other Topics Concern  . Not on file   Social History Narrative  . No narrative on file    Review of Systems: All systems reviewed and negative except where noted in HPI.  Physical Exam: Vital signs in last 24 hours: Temp:  [98.1 F (36.7 C)-98.6 F (37 C)] 98.3 F (36.8 C)  (11/28 0542) Pulse Rate:  [72-105] 105 (11/28 0542) Resp:  [12-18] 18 (11/28 0542) BP: (92-140)/(53-81) 137/62 (11/28 0542) SpO2:  [93 %-100 %] 97 % (11/28 0542) Last BM Date: 07/19/16 General:   Alert,  obese, cooperative female in NAD. Daughter at bedside Head:  Normocephalic and atraumatic. Eyes:  Sclera clear, no icterus.   Conjunctiva pink.  Ears:  Normal auditory acuity. Nose:  No deformity, discharge,  or lesions. Mouth:  No deformity or lesions.   Neck:  Supple; no masses  Lungs:  Clear throughout to auscultation.   No wheezes, crackles, or rhonchi.  Heart:  Regular rate and rhythm; no murmurs, clicks, rubs,  or gallops. Abdomen:  Soft,nontender, BS active,nonpalp mass or hsm.   Rectal:  Deferred  Msk:  Symmetrical without gross deformities. . Pulses:  Normal pulses noted. Extremities:  Without clubbing or edema. Neurologic:  Alert and  oriented x4;  grossly normal neurologically. Skin:  Intact without significant lesions or rashes.. Psych:  Alert and cooperative. Normal mood and affect.  Intake/Output from previous day: 11/27 0701 - 11/28 0700 In: 407.8 [P.O.:240; I.V.:167.8] Out: -  Intake/Output this shift: No intake/output data recorded.  Lab Results:  Recent Labs  07/19/16 1749 07/20/16 0058 07/20/16 1055  WBC 7.3 7.7 7.8  HGB 8.6* 7.9* 8.4*  HCT 27.5* 25.1* 26.4*  PLT 278 271 293   BMET  Recent Labs  07/19/16 0705 07/20/16 0058  NA 135 133*  K 4.0 3.9  CL 102 101  CO2 25 28  GLUCOSE 124* 109*  BUN 16 12  CREATININE 0.77 0.87  CALCIUM 9.6 8.9   LFT  Recent Labs  07/19/16 0705  PROT 6.9  ALBUMIN 3.1*  AST 21  ALT 13*  ALKPHOS 98  BILITOT 0.7   PT/INR  Recent Labs  07/19/16 1516  LABPROT 13.7  INR 1.05    Studies/Results: Ct Angio Chest Pe W And/or Wo Contrast  Result Date: 07/19/2016 CLINICAL DATA:  Abnormal CT abdomen and pelvis.  Abdominal pain. EXAM: CT ANGIOGRAPHY CHEST WITH CONTRAST TECHNIQUE: Multidetector CT imaging  of the chest was performed using the standard protocol during bolus administration of intravenous contrast. Multiplanar CT image reconstructions and MIPs were obtained to evaluate the vascular anatomy. CONTRAST:  100 cc Isovue 370 COMPARISON:  07/04/2016 FINDINGS: Cardiovascular: There is low-density filling defect within the right pulmonary artery segmental branch of the lateral basal segment of the right lower lobe. It extends into subsegmental branches. This is compatible with pulmonary thromboembolism. There is also segmental and subsegmental pulmonary thromboembolism at the base of the right middle lobe. There are no central filling defects in the pulmonary arterial tree. No evidence of left pulmonary thromboembolism. Coronary artery calcifications. Mediastinum/Nodes: No abnormal mediastinal adenopathy. Right infrahilar lymphoid tissue there is stable and mildly prominent. Lungs/Pleura: Advanced apical centrilobular emphysema. There is a questionable nodular density in the superior segment of the left lower lobe on image 42 of series 14, measuring 1.0 cm. There is scattered volume loss in the lungs. Dependent atelectasis bilaterally. No pneumothorax. No pleural effusion. Upper Abdomen: Simple cysts in the left kidney. Musculoskeletal: No vertebral compression deformity. Mild scoliosis of the thoracic spine. Review of the MIP images confirms the above findings. IMPRESSION: The study is positive for acute pulmonary thromboembolism in the right middle and lower lobes. This affects segmental and subsegmental branches. Possible 1.0 cm left lower lobe pulmonary nodule. Electronically Signed   By: [name] M.D.   On: 07/19/2016 13:36   Ct Abdomen Pelvis W Contrast  Result Date: 07/19/2016 CLINICAL DATA:  Abdominal pain EXAM: CT ABDOMEN AND PELVIS WITH CONTRAST TECHNIQUE: Multidetector CT imaging of the abdomen and pelvis was performed using the standard protocol following bolus administration of intravenous  contrast. Oral contrast was also administered. CONTRAST:  123mL ISOVUE-300 IOPAMIDOL (ISOVUE-300) INJECTION 61% COMPARISON:  CT abdomen and pelvis July 24, 2014 FINDINGS: Lower chest: There is bibasilar lung atelectasis. Heart is mildly enlarged. There is a focal area of decreased attenuation in the region of the left ventricle seen on axial slice 1 series 2 measuring 1.5 x 1.4 cm. There is a questionable pulmonary embolus in a right lower lobe pulmonary artery seen on axial slice 1 series 2. Hepatobiliary: There is an infiltrative appearing mass in the anterior segment of the right lobe of the liver measuring 7.7 x 4.9 cm. No other focal liver lesion is evident. There is extensive sludge in the gallbladder. An underlying mass in the gallbladder cannot be excluded. The gallbladder wall appears rather prominent by CT. There is no appreciable biliary duct dilatation. Pancreas: No pancreatic mass or inflammatory focus evident. Spleen: No splenic lesions are evident. Adrenals/Urinary Tract: Adrenals appear unremarkable bilaterally. There are two cysts arising from the upper pole of the left kidney eccentrically measuring just over 1 cm in size each. No non cystic liver lesions are evident. There is no hydronephrosis on either side. There is fetal lobulation on each side, an anatomic variant. There is no appreciable renal or ureteral calculus on either side. Urinary bladder is midline with wall thickness within normal limits. Stomach/Bowel: There are scattered colonic diverticula, primarily in the sigmoid region without diverticulitis. There is fairly diffuse stool throughout much of the colon. There is a right-sided femoral hernia which contains small bowel but no bowel compromise. There is no bowel dilatation or air-fluid level suggesting bowel obstruction. No free air or portal venous air is evident. Vascular/Lymphatic: There is extensive atherosclerotic calcification in the aorta and major pelvic arterial vessels  diffusely. No aneurysms are evident. Major mesenteric arterial vessels appear patent with moderate atherosclerotic calcification noted, particularly at the origin of the left renal artery. There is periportal region adenopathy. The largest of the periportal lymph nodes measures 4.4 x 2.3 cm. There is no adenopathy distant to the periportal region. Reproductive: Uterus appears absent. No pelvic mass or pelvic fluid collection evident. Other: Appendix not appreciable. No periappendiceal region inflammation. No abscess or ascites evident in the abdomen or pelvis. Musculoskeletal: There is extensive degenerative change in the lumbar spine. There is mild anterolisthesis of L4 on L5 and mild anterolisthesis of L5 on S1, findings present previously. There are no blastic or lytic bone lesions. There is no intramuscular or abdominal wall lesion. IMPRESSION: Large liver lesion felt to represent neoplastic focus. There is extensive periportal adenopathy. The gallbladder contains sludge with questionable mass within the gallbladder. Gallbladder wall appears borderline thickened. These findings raise concern for possible carcinoma arising from the gallbladder with liver metastasis and periportal adenopathy. Primary liver carcinoma is a differential consideration. Given the proclivity of colon carcinoma to metastasize to the liver, it may be prudent to consider direct visualization of the colon in this regard. Questionable pulmonary embolus in a right lower lobe pulmonary artery branch. This finding may warrant CT angiogram of the chest for further assessment. Questionable lesion in the left ventricle. This finding may warrant echocardiography to further assess. There is extensive aortic atherosclerosis as well as atherosclerosis throughout the major pelvic vessels. No aneurysm evident. Right-sided femoral hernia containing small bowel but no bowel compromise. Critical Value/emergent results were called by telephone at the time  of interpretation on 07/19/2016 at 10:09 am to [place], PA , who verbally acknowledged these results. Electronically Signed   By: Lowella Grip III M.D.   On: 07/19/2016 10:10    IMPRESSION / PLAN:  Hemoccult positive normocytic anemia. Hgb 11.6 the end of July, slow drift downward since then from 10.2 on the 12th of this month to 8.4 today. No overt bleeding or bowel changes but she has involuntarily lost 20+ pounds since August. No NSAIDs use. Last colonoscopy > 10 years ago.  -PPI, transfuse as needed - No immediate plans for endoscopic workup  Large right liver lesion (in absence of cirrhosis) on CTscan done for abdominal pain.  Rule out primary liver cancer, metastatic gallbladder cancer, colon cancer with mets ?  She had an U/S in May which showed a non-mobile gallbladder mass but she apparently declined MRI.  -will obtain MRI with and without contrast.  Right lobe pulmonary embolism, possibly secondary to  On heparin gtt.   AFib, hasn't been able to afford anticoagulants   Tye Savoy  07/20/2016, 11:40 AM Pager number [phone]  GI ATTENDING.   History, laboratories, x-rays, remote endoscopy reports reviewed. Patient personally seen and examined. Daughter in room. Complicated patient with multiple medical problems including morbid obesity and atrial fibrillation for which she had been on anticoagulation. Presents with acute right upper quadrant pain. This found to have both liver mass and pulmonary emboli. Now on anticoagulation. Asked to see regarding liver lesion as well as anemia with heme-positive stool. Currently without acute or subacute GI bleeding and none by history. Anemia is normocytic. Findings and liver seem to explain persistent right upper quadrant pain. Imaging findings are worrisome for malignancy, primary gallbladder versus other. Had previous imaging abnormalities. This seems progressive and likely part of the same. Recommend twice a day PPI for  upper GI mucosal protection in a patient on anticoagulation with heme positive stools. Recommend transfusion as clinically indicated for anemia. Recommend MRI of the liver to further evaluate liver lesion. May need liver biopsy for definitive diagnosis. Would need to come off anticoagulation. Question role for vena caval filter. No plans for endoscopic evaluations short of acute bleeding. Discussed with patient and daughter. I spoke with her nurse regarding pain management. She will contact the supervising physician regarding adjustment of pain medication if needed.  Docia Chuck. Geri [place]in[place]., M.D. [facility] Division of Gastroenterology

## 2016-07-20 NOTE — Progress Notes (Signed)
ANTICOAGULATION CONSULT NOTE - Follow Up Consult  Pharmacy Consult for Heparin Indication: pulmonary embolus  Allergies  Allergen Reactions  . Amoxicillin-Pot Clavulanate Diarrhea    Has patient had a PCN reaction causing immediate rash, facial/tongue/throat swelling, SOB or lightheadedness with hypotension: No Has patient had a PCN reaction causing severe rash involving mucus membranes or skin necrosis: No Has patient had a PCN reaction that required hospitalization No Has patient had a PCN reaction occurring within the last 10 years: Yes If all of the above answers are "NO", then may proceed with Cephalosporin use.   . Aspirin Nausea And Vomiting  . Atorvastatin     REACTION: feel weak  . Carisoprodol     REACTION: intolerant  . Codeine     REACTION: hallucinations  . Codeine Phosphate     REACTION: UNKNOWN  . Cortisone     REACTION: pt. reports allergy, reaction not known  . Duoderm Hydroactive     Skin irritation and pain     . Ezetimibe-Simvastatin     REACTION: feels weak  . Naproxen Sodium     REACTION: GI symptoms  . Omeprazole     REACTION: abd pain  . Quinapril Hcl     REACTION: reports allergy, reaction not known  . Rofecoxib     REACTION: reports allergy, reaction not known  . Silvadene [Silver Sulfadiazine]     Skin irritation and pain   . Xarelto [Rivaroxaban] Other (See Comments)    Pain     Patient Measurements: Height: 5\' 7"  (170.2 cm) Weight: 213 lb (96.6 kg) IBW/kg (Calculated) : 61.6 Heparin Dosing Weight: 83kg  Vital Signs: Temp: 98.3 F (36.8 C) (11/28 0542) Temp Source: Oral (11/28 0542) BP: 137/62 (11/28 0542) Pulse Rate: 105 (11/28 0542)  Labs:  Recent Labs  07/19/16 0705 07/19/16 1516 07/19/16 1749 07/20/16 0058 07/20/16 1055  HGB 8.5*  --  8.6* 7.9* 8.4*  HCT 26.5*  --  27.5* 25.1* 26.4*  PLT 261  --  278 271 293  APTT  --  32  --  133* 93*  LABPROT  --  13.7  --   --   --   INR  --  1.05  --   --   --   HEPARINUNFRC   --  0.54  --  1.02* 0.94*  CREATININE 0.77  --   --  0.87  --     Estimated Creatinine Clearance: 63.6 mL/min (by C-G formula based on SCr of 0.87 mg/dL).   Medications:  Infusions:  . heparin 1,100 Units/hr (07/20/16 0320)    Assessment: 41 yoF with PMHx polymyalgia rheumatica, chronic pain, IBS, diverticulitis, afib with SVT and PVCs, HTN/HLD, anemia, COPD and obesity presents with right sided abdominal pain.  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  Pharmacy consulted to start IV heparin.  Discussed case with PA as pt has positive FOB and hemoglobin as dropped 10.2 to 8.5 in the last two weeks.  Will begin very conservatively with heparin and omit bolus.    Patient was on xarelto but stopped d/t GI intolerance, Cardiology started her on Eliquis but unable to afford.  Patient states she has not be on anticoagulation for ~ 1 week.   Today, 07/20/2016:  Heparin level remains SUPRAtherapeutic (heparin at 1100 units/hr   APTT is within goal (upper end)   There should not be heparin level interference with DOAC as has been off for 1 week (baseline heparin  level was elevated)  CBC: repeat Hgb improved, pltc WNL  FOBT was positive, GI to evaluate. Patient believes last colonscopy was ~ 60 y ago  No obvious bleeding  Goal of Therapy:  Heparin level 0.3-0.7 units/ml, prefer 0.3-0.5 until rule out GIB APTT goal 66-102 sec Monitor platelets by anticoagulation protocol: Yes   Plan:   Decrease heparin to 900 units/hr  Recheck PTT and heparin level at 2100  Daily CBC and heparin level  Likely transition to warfarin when appropriate.  Rule out GIB  Doreene Eland, PharmD, BCPS.   Pager: [phone] 07/20/2016 12:18 PM

## 2016-07-20 NOTE — Progress Notes (Addendum)
Patient ID: [name], female   DOB: 1936/01/25, 80 y.o.   MRN: [id number]  PROGRESS NOTE    [name]  [id number] DOB: 1936-05-15 DOA: 07/19/2016  PCP: [name], MD   Brief Narrative:  41 -year-old female with past medical history significant for atrial fibrillation, patient reports she has not taken her blood thinners for at least couple of days because she wasn't feeling well, polymyalgia rheumatica on low-dose prednisone at home. She presented to [facility] long hospital with abdominal discomfort for last couple of days prior to this admission. CT abdomen pelvis showed possible carcinoma arising from gallbladder with liver metastases and periportal adenopathy. She also has possible 1 cm left lower lobe pulmonary nodule. CT angiogram of the chest showed pulmonary embolism and she was started on heparin drip.  Assessment & Plan:   Active Problems: Acute pulmonary embolism without cor pulmonale - As noted above, patient was already on xarelto and apixaban, she did not tolerate xarelto and apixaban was too expensive.  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 / left lower pulmonary nodule - We'll see with a GI if patient would need colonoscopy - She may require biopsy, either liver or lung nodule but will follow-up on GI recommendations first  Hypothyroidism - Continue Synthroid 88 g daily  Polymyalgia rheumatica - Continue low-dose prednisone  Essential hypertension - Continue metoprolol 150 mg daily  Chronic atrial fibrillation - CHADS vasc score 2 - Currently on  anticoagulation with heparin drip - Rate controlled with metoprolol    DVT prophylaxis: Heparin drip Code Status: full code  Family Communication: No family at the bedside Disposition Plan: We first need to see what GI thinks about continuing anticoagulation in the setting of Hemoccult positive blood. If okay to continue anticoagulation and most likely patient will need Coumadin. The reason is, xarelto made patient very nauseous and she did not tolerated very well. Apixaban she could not afford.    Consultants:   Gastroenterology  Procedures:   None  Antimicrobials:   None    Subjective: No overnight events.  Objective: Vitals:   07/19/16 1630 07/19/16 1641 07/19/16 2219 07/20/16 0542  BP: 102/74  140/81 137/62  Pulse: 82  84 (!) 105  Resp: 12  18 18   Temp:  98.6 F (37 C) 98.1 F (36.7 C) 98.3 F (36.8 C)  TempSrc:  Oral Oral Oral  SpO2: 100%  95% 97%  Weight:      Height:        Intake/Output Summary (Last 24 hours) at 07/20/16 1146 Last data filed at 07/20/16 0600  Gross per 24 hour  Intake           407.79 ml  Output                0 ml  Net           407.79 ml   Filed Weights   07/19/16 0616  Weight: 96.6 kg (213 lb)    Examination:  General exam: Appears calm and comfortable  Respiratory system: Clear to auscultation. Respiratory effort normal. Cardiovascular system: S1 & S2 heard,  RRR.  Gastrointestinal system: Abdomen is nondistended, soft and nontender. No organomegaly or masses felt. Normal bowel sounds heard. Central nervous system: Alert and oriented. No focal neurological deficits. Extremities: Symmetric 5 x 5 power. Skin: No rashes, lesions or ulcers Psychiatry: Judgement and insight appear normal. Mood & affect appropriate.   Data Reviewed: I have personally reviewed following labs and imaging studies  CBC:  Recent Labs Lab 07/19/16 0705 07/19/16 1749 07/20/16 0058 07/20/16 1055  WBC 7.1 7.3 7.7 7.8  NEUTROABS 5.1  --   --    --   HGB 8.5* 8.6* 7.9* 8.4*  HCT 26.5* 27.5* 25.1* 26.4*  MCV 87.2 89.0 89.0 86.8  PLT 261 278 271 0000000   Basic Metabolic Panel:  Recent Labs Lab 07/19/16 0705 07/20/16 0058  NA 135 133*  K 4.0 3.9  CL 102 101  CO2 25 28  GLUCOSE 124* 109*  BUN 16 12  CREATININE 0.77 0.87  CALCIUM 9.6 8.9   GFR: Estimated Creatinine Clearance: 63.6 mL/min (by C-G formula based on SCr of 0.87 mg/dL). Liver Function Tests:  Recent Labs Lab 07/19/16 0705  AST 21  ALT 13*  ALKPHOS 98  BILITOT 0.7  PROT 6.9  ALBUMIN 3.1*    Recent Labs Lab 07/19/16 0705  LIPASE 18   No results for input(s): AMMONIA in the last 168 hours. Coagulation Profile:  Recent Labs Lab 07/19/16 1516  INR 1.05   Cardiac Enzymes: No results for input(s): CKTOTAL, CKMB, CKMBINDEX, TROPONINI in the last 168 hours. BNP (last 3 results)  Recent Labs  09/11/15 1200  PROBNP 326.0*   HbA1C: No results for input(s): HGBA1C in the last 72 hours. CBG: No results for input(s): GLUCAP in the last 168 hours. Lipid Profile: No results for input(s): CHOL, HDL, LDLCALC, TRIG, CHOLHDL, LDLDIRECT in the last 72 hours. Thyroid Function Tests: No results for input(s): TSH, T4TOTAL, FREET4, T3FREE, THYROIDAB in the last 72 hours. Anemia Panel: No results for input(s): VITAMINB12, FOLATE, FERRITIN, TIBC, IRON, RETICCTPCT in the last 72 hours. Urine analysis:    Component Value Date/Time   COLORURINE YELLOW 07/19/2016 [employer] 07/19/2016 1037   LABSPEC 1.027 07/19/2016 1037   LABSPEC 1.025 07/22/2015 1216   PHURINE 5.5 07/19/2016 1037   GLUCOSEU NEGATIVE 07/19/2016 1037   GLUCOSEU Negative 07/22/2015 1216   HGBUR NEGATIVE 07/19/2016 1037   HGBUR trace-lysed 12/27/2006 1459   BILIRUBINUR NEGATIVE 07/19/2016 1037   BILIRUBINUR Negative 07/22/2015 1216   KETONESUR NEGATIVE 07/19/2016 1037   PROTEINUR NEGATIVE 07/19/2016 1037   UROBILINOGEN 0.2 07/22/2015 1216   NITRITE NEGATIVE 07/19/2016  1037   LEUKOCYTESUR NEGATIVE 07/19/2016 1037   LEUKOCYTESUR Negative 07/22/2015 1216   Sepsis Labs: @LABRCNTIP (procalcitonin:4,lacticidven:4)   )No results found for this or any previous visit (from the past 240 hour(s)).    Radiology Studies: Ct Angio Chest Pe W And/or Wo Contrast Result Date: 07/19/2016 The study is positive for acute pulmonary thromboembolism in the right middle and lower lobes. This affects segmental and subsegmental branches. Possible 1.0 cm left lower lobe pulmonary nodule.   Ct Abdomen Pelvis W Contrast Result Date: 07/19/2016 Large liver lesion felt to represent neoplastic focus. There is extensive periportal adenopathy. The gallbladder contains sludge with questionable mass within the gallbladder. Gallbladder wall appears borderline thickened. These findings raise concern for possible carcinoma arising from the gallbladder with liver metastasis and periportal adenopathy. Primary liver carcinoma is a differential consideration. Given the proclivity of colon carcinoma to metastasize to the liver, it  may be prudent to consider direct visualization of the colon in this regard. Questionable pulmonary embolus in a right lower lobe pulmonary artery branch. This finding may warrant CT angiogram of the chest for further assessment. Questionable lesion in the left ventricle. This finding may warrant echocardiography to further assess. There is extensive aortic atherosclerosis as well as atherosclerosis throughout the major pelvic vessels. No aneurysm evident. Right-sided femoral hernia containing small bowel but no bowel compromise. Critical Value/emergent results were called by telephone at the time of interpretation on 07/19/2016 at 10:09 am to [place], [place] , who verbally acknowledged these results. Electronically Signed   By: Lowella Grip III M.D.   On: 07/19/2016 10:10     Scheduled Meds: . levothyroxine  88 mcg Oral QAC breakfast  . metoprolol succinate   150 mg Oral Daily  . morphine  15 mg Oral Q breakfast  . morphine  30 mg Oral QHS  . pantoprazole  40 mg Oral Daily  . predniSONE  1 mg Oral Q breakfast  . sertraline  50 mg Oral Daily  . sodium chloride flush  3 mL Intravenous Q12H  . sodium chloride flush  3 mL Intravenous Q12H   Continuous Infusions: . heparin 1,100 Units/hr (07/20/16 0320)     LOS: 1 day    Time spent: 25 minutes  Greater than 50% of the time spent on counseling and coordinating the care.   Lynn Lenz, MD [facility] Pager [phone]  If 7PM-7AM, please contact night-coverage [email or web address] Password [facility] 07/20/2016, 11:46 AM

## 2016-07-21 ENCOUNTER — Encounter (HOSPITAL_COMMUNITY): Payer: Self-pay | Admitting: Radiology

## 2016-07-21 ENCOUNTER — Inpatient Hospital Stay (HOSPITAL_COMMUNITY): Payer: 59

## 2016-07-21 DIAGNOSIS — R160 Hepatomegaly, not elsewhere classified: Secondary | ICD-10-CM

## 2016-07-21 DIAGNOSIS — K828 Other specified diseases of gallbladder: Secondary | ICD-10-CM | POA: Diagnosis present

## 2016-07-21 DIAGNOSIS — C221 Intrahepatic bile duct carcinoma: Secondary | ICD-10-CM | POA: Diagnosis present

## 2016-07-21 DIAGNOSIS — C787 Secondary malignant neoplasm of liver and intrahepatic bile duct: Secondary | ICD-10-CM

## 2016-07-21 DIAGNOSIS — I2699 Other pulmonary embolism without acute cor pulmonale: Secondary | ICD-10-CM | POA: Diagnosis present

## 2016-07-21 DIAGNOSIS — E039 Hypothyroidism, unspecified: Secondary | ICD-10-CM | POA: Diagnosis present

## 2016-07-21 DIAGNOSIS — I2782 Chronic pulmonary embolism: Secondary | ICD-10-CM

## 2016-07-21 LAB — BASIC METABOLIC PANEL
Anion gap: 6 (ref 5–15)
BUN: 11 mg/dL (ref 6–20)
CO2: 27 mmol/L (ref 22–32)
Calcium: 8.7 mg/dL — ABNORMAL LOW (ref 8.9–10.3)
Chloride: 102 mmol/L (ref 101–111)
Creatinine, Ser: 0.73 mg/dL (ref 0.44–1.00)
GFR calc Af Amer: >60 mL/min (ref >60)
GFR calc non Af Amer: >60 mL/min (ref >60)
Glucose, Bld: 98 mg/dL (ref 65–99)
Potassium: 3.8 mmol/L (ref 3.5–5.1)
Sodium: 135 mmol/L (ref 135–145)

## 2016-07-21 LAB — CBC
HCT: 25.4 % — ABNORMAL LOW (ref 36.0–46.0)
Hemoglobin: 8 g/dL — ABNORMAL LOW (ref 12.0–15.0)
MCH: 28.2 pg (ref 26.0–34.0)
MCHC: 31.5 g/dL (ref 30.0–36.0)
MCV: 89.4 fL (ref 78.0–100.0)
Platelets: 286 10*3/uL (ref 150–400)
RBC: 2.84 MIL/uL — ABNORMAL LOW (ref 3.87–5.11)
RDW: 18.2 % — ABNORMAL HIGH (ref 11.5–15.5)
WBC: 7.4 10*3/uL (ref 4.0–10.5)

## 2016-07-21 LAB — HEPARIN LEVEL (UNFRACTIONATED): Heparin Unfractionated: 0.42 IU/mL (ref 0.30–0.70)

## 2016-07-21 MED ORDER — MORPHINE SULFATE (PF) 2 MG/ML IV SOLN
2.0000 mg | INTRAVENOUS | Status: DC | PRN
  Administered 2016-07-21 – 2016-07-22 (×4): 2 mg via INTRAVENOUS
  Filled 2016-07-21 (×4): qty 1

## 2016-07-21 MED ORDER — GADOBENATE DIMEGLUMINE 529 MG/ML IV SOLN
20.0000 mL | Freq: Once | INTRAVENOUS | Status: AC | PRN
  Administered 2016-07-21: 20 mL via INTRAVENOUS

## 2016-07-21 MED ORDER — OXYCODONE HCL 5 MG PO TABS
5.0000 mg | ORAL_TABLET | ORAL | Status: DC | PRN
  Administered 2016-07-21 (×2): 5 mg via ORAL
  Filled 2016-07-21 (×2): qty 1

## 2016-07-21 NOTE — Progress Notes (Signed)
ANTICOAGULATION CONSULT NOTE - Follow Up Consult  Pharmacy Consult for Heparin Indication: pulmonary embolus  Allergies  Allergen Reactions  . Amoxicillin-Pot Clavulanate Diarrhea    Has patient had a PCN reaction causing immediate rash, facial/tongue/throat swelling, SOB or lightheadedness with hypotension: No Has patient had a PCN reaction causing severe rash involving mucus membranes or skin necrosis: No Has patient had a PCN reaction that required hospitalization No Has patient had a PCN reaction occurring within the last 10 years: Yes If all of the above answers are "NO", then may proceed with Cephalosporin use.   . Aspirin Nausea And Vomiting  . Atorvastatin     REACTION: feel weak  . Carisoprodol     REACTION: intolerant  . Codeine     REACTION: hallucinations  . Codeine Phosphate     REACTION: UNKNOWN  . Cortisone     REACTION: pt. reports allergy, reaction not known  . Duoderm Hydroactive     Skin irritation and pain     . Ezetimibe-Simvastatin     REACTION: feels weak  . Naproxen Sodium     REACTION: GI symptoms  . Omeprazole     REACTION: abd pain  . Quinapril Hcl     REACTION: reports allergy, reaction not known  . Rofecoxib     REACTION: reports allergy, reaction not known  . Silvadene [Silver Sulfadiazine]     Skin irritation and pain   . Xarelto [Rivaroxaban] Other (See Comments)    Pain     Patient Measurements: Height: 5\' 7"  (170.2 cm) Weight: 213 lb (96.6 kg) IBW/kg (Calculated) : 61.6 Heparin Dosing Weight: 83kg  Vital Signs: Temp: 98 F (36.7 C) (11/29 0625) Temp Source: Oral (11/29 0625) BP: 130/58 (11/29 0625) Pulse Rate: 71 (11/29 0625)  Labs:  Recent Labs  07/19/16 0705  07/19/16 1516  07/20/16 0058 07/20/16 1055 07/20/16 1718 07/20/16 2106 07/21/16 0530  HGB 8.5*  --   --   < > 7.9* 8.4* 8.1*  --  8.0*  HCT 26.5*  --   --   < > 25.1* 26.4* 25.7*  --  25.4*  PLT 261  --   --   < > 271 293 298  --  286  APTT  --   < > 32  --   133* 93*  --  68*  --   LABPROT  --   --  13.7  --   --   --   --   --   --   INR  --   --  1.05  --   --   --   --   --   --   HEPARINUNFRC  --   < > 0.54  --  1.02* 0.94*  --  0.58 0.42  CREATININE 0.77  --   --   --  0.87  --   --   --  0.73  < > = values in this interval not displayed.  Estimated Creatinine Clearance: 69.2 mL/min (by C-G formula based on SCr of 0.73 mg/dL).   Medications:  Infusions:  . heparin 900 Units/hr (07/20/16 1237)    Assessment: 25 yoF with PMHx polymyalgia rheumatica, chronic pain, IBS, diverticulitis, afib with SVT and PVCs, HTN/HLD, anemia, COPD and obesity presents with right sided abdominal pain.  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  Pharmacy consulted to start IV heparin.  Discussed case with PA as pt has positive FOB and  hemoglobin as dropped 10.2 to 8.5 in the last two weeks.  Will begin very conservatively with heparin and omit bolus.    Patient was on xarelto but stopped d/t GI intolerance, Cardiology started her on Eliquis but unable to afford.  Patient states she has not be on anticoagulation for ~ 1 week.   Today, 07/21/2016:  Heparin level therapeutic (heparin at 900 units/hr    There should not be heparin level interference with DOAC as has been off for 1 week (baseline heparin level was elevated)  CBC: repeat Hgb low/stable, pltc WNL  FOBT was positive, GI to evaluate. Patient believes last colonscopy was ~ 41 y ago  No obvious bleeding  Goal of Therapy:  Heparin level 0.3-0.7 units/ml, prefer 0.3-0.5 until rule out GIB APTT goal 66-102 sec Monitor platelets by anticoagulation protocol: Yes   Plan:   Continue heparin at 900 units/hr  Repeat heparin level at this afternoon, level trending down.  Daily CBC and heparin level  Likely transition to warfarin when appropriate.  May need liver biopsy (f/u MRI results and plan)  Doreene Eland, PharmD, BCPS.   Pager:  [phone] 07/21/2016 8:44 AM

## 2016-07-21 NOTE — Progress Notes (Signed)
PROGRESS NOTE                                                                                                                                                                                                             Patient Demographics:    [name], is a 80 y.o. female, DOB - 1936-02-26, BB:[id number]  Admit date - 07/19/2016   Admitting Physician Velvet Bathe, MD  Outpatient Primary MD for the patient is [name], MD  LOS - 2  Outpatient Specialists: Dr Irene Limbo  Chief Complaint  Patient presents with  . Abdominal Pain       Brief Narrative   80 year old female with history of A. fib on anticoagulation, polymyalgia rheumatica on low-dose prednisone, IBS, COPD, MGUS (follows with Dr. Irene Limbo) presented with abdominal discomfort for last few days. She has also had almost 20 pound weight loss in the past 3 months. In the ED CT of the abdomen and pelvis showed a large liver lesion with extensive periportal adenopathy. Also showed gallbladder with sludge with questionable mass within the gallbladder and thickened wall. A CT angiogram of the chest done was positive for acute PE in the right middle and lower lobes. (Also showed a 1 cm left lower lobe pulmonary nodule). Patient admitted to hospitalist service and GI consulted.   Subjective:   Patient complains of some right upper quadrant pain. CT findings discussed with plan on obtaining MRI of the abdomen.   Assessment  & Plan :    Principal Problem:   Pulmonary embolism without acute cor pulmonale ([place]) Patient was on Xarelto at home which she has not been taking due to? Cost. Currently on IV heparin. Eliquis is also very extensive for her. May need Coumadin. Hemoccult-positive. H&H is currently stable. Will hold off on starting Coumadin for now (also will need liver or gallbladder biopsy). -Appreciated GI evaluation.   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 Will need biopsy of the lesion. Check AFP and CEA. Pain control with when necessary oxycodone (every 4 hours) and resume home MS Contin dose. Add when necessary IV morphine.  Anemia of chronic disease Monitor hemoglobin closely while on heparin drip. Hemoccult positive. No need for colonoscopy at present per GI.  Hypothyroidism Continue Synthroid.  Chronic A. fib  Rate controlled with metoprolol. Continue IV heparin for now. Will likely need Coumadin upon discharge due to issue with cost for NOAC.     PMR (polymyalgia rheumatica) (HCC) Continue low-dose prednisone and home MS Contin.     1.    Code Status : Full code  Family Communication  : Daughter at bedside  Disposition Plan  : Home once workup completed  Barriers For Discharge : Active symptoms  Consults  :  [facility]  Procedures  :  CT abdomen, chest with PE protocol MRI abdomen  DVT Prophylaxis  : IV heparin  Lab Results  Component Value Date   PLT 286 07/21/2016    Antibiotics  :    Anti-infectives    None        Objective:   Vitals:   07/20/16 1030 07/20/16 1355 07/20/16 2205 07/21/16 0625  BP: 131/88 139/71 125/63 (!) 130/58  Pulse: 92 72 76 71  Resp:  16 18 18   Temp:  98.7 F (37.1 C) 99.3 F (37.4 C) 98 F (36.7 C)  TempSrc:  Oral Oral Oral  SpO2:  93% 95% 93%  Weight:      Height:        Wt Readings from Last 3 Encounters:  07/19/16 96.6 kg (213 lb)  07/12/16 97.5 kg (215 lb)  07/04/16 98 kg (216 lb)     Intake/Output Summary (Last 24 hours) at 07/21/16 1357 Last data filed at 07/21/16 1000  Gross per 24 hour  Intake              974 ml  Output                0 ml  Net              974 ml     Physical Exam  Gen: Appears fatigued HEENT: Pallor present, no icterus, moist mucosa, supple neck Chest: clear b/l, no added sounds CVS: N S1&S2, no murmurs, rubs or  gallop GI: soft, nondistended, bowel sounds present, mild right upper quadrant tenderness Musculoskeletal: warm, no edema     Data Review:    CBC  Recent Labs Lab 07/19/16 0705 07/19/16 1749 07/20/16 0058 07/20/16 1055 07/20/16 1718 07/21/16 0530  WBC 7.1 7.3 7.7 7.8 8.3 7.4  HGB 8.5* 8.6* 7.9* 8.4* 8.1* 8.0*  HCT 26.5* 27.5* 25.1* 26.4* 25.7* 25.4*  PLT 261 278 271 293 298 286  MCV 87.2 89.0 89.0 86.8 86.8 89.4  MCH 28.0 27.8 28.0 27.6 27.4 28.2  MCHC 32.1 31.3 31.5 31.8 31.5 31.5  RDW 17.7* 18.1* 18.1* 18.0* 17.9* 18.2*  LYMPHSABS 1.3  --   --   --   --   --   MONOABS 0.6  --   --   --   --   --   EOSABS 0.1  --   --   --   --   --   BASOSABS 0.0  --   --   --   --   --     Chemistries   Recent Labs Lab 07/19/16 0705 07/20/16 0058 07/21/16 0530  NA 135 133* 135  K 4.0 3.9 3.8  CL 102 101 102  CO2 25 28 27   GLUCOSE 124* 109* 98  BUN 16 12 11   CREATININE 0.77 0.87 0.73  CALCIUM 9.6 8.9 8.7*  AST 21  --   --   ALT 13*  --   --   ALKPHOS 98  --   --  BILITOT 0.7  --   --    ------------------------------------------------------------------------------------------------------------------ No results for input(s): CHOL, HDL, LDLCALC, TRIG, CHOLHDL, LDLDIRECT in the last 72 hours.  Lab Results  Component Value Date   HGBA1C 5.7 (H) 06/17/2016   ------------------------------------------------------------------------------------------------------------------ No results for input(s): TSH, T4TOTAL, T3FREE, THYROIDAB in the last 72 hours.  Invalid input(s): FREET3 ------------------------------------------------------------------------------------------------------------------ No results for input(s): VITAMINB12, FOLATE, FERRITIN, TIBC, IRON, RETICCTPCT in the last 72 hours.  Coagulation profile  Recent Labs Lab 07/19/16 1516  INR 1.05    No results for input(s): DDIMER in the last 72 hours.  Cardiac Enzymes No results for input(s): CKMB,  TROPONINI, MYOGLOBIN in the last 168 hours.  Invalid input(s): CK ------------------------------------------------------------------------------------------------------------------    Component Value Date/Time   BNP 197.8 (H) 07/04/2016 0056   BNP 130.2 (H) 09/22/2015 [id number]    Inpatient Medications  Scheduled Meds: . levothyroxine  88 mcg Oral QAC breakfast  . metoprolol succinate  150 mg Oral Daily  . morphine  15 mg Oral Q breakfast  . morphine  30 mg Oral QHS  . pantoprazole  40 mg Oral BID  . predniSONE  1 mg Oral Q breakfast  . sertraline  50 mg Oral Daily  . sodium chloride flush  3 mL Intravenous Q12H  . sodium chloride flush  3 mL Intravenous Q12H   Continuous Infusions: . heparin 900 Units/hr (07/20/16 1237)   PRN Meds:.sodium chloride, gadobenate dimeglumine, meclizine, morphine injection, oxyCODONE, sodium chloride flush  Micro Results No results found for this or any previous visit (from the past 240 hour(s)).  Radiology Reports Ct Angio Chest Pe W And/or Wo Contrast  Result Date: 07/19/2016 CLINICAL DATA:  Abnormal CT abdomen and pelvis.  Abdominal pain. EXAM: CT ANGIOGRAPHY CHEST WITH CONTRAST TECHNIQUE: Multidetector CT imaging of the chest was performed using the standard protocol during bolus administration of intravenous contrast. Multiplanar CT image reconstructions and MIPs were obtained to evaluate the vascular anatomy. CONTRAST:  100 cc Isovue 370 COMPARISON:  07/04/2016 FINDINGS: Cardiovascular: There is low-density filling defect within the right pulmonary artery segmental branch of the lateral basal segment of the right lower lobe. It extends into subsegmental branches. This is compatible with pulmonary thromboembolism. There is also segmental and subsegmental pulmonary thromboembolism at the base of the right middle lobe. There are no central filling defects in the pulmonary arterial tree. No evidence of left pulmonary thromboembolism. Coronary artery  calcifications. Mediastinum/Nodes: No abnormal mediastinal adenopathy. Right infrahilar lymphoid tissue there is stable and mildly prominent. Lungs/Pleura: Advanced apical centrilobular emphysema. There is a questionable nodular density in the superior segment of the left lower lobe on image 42 of series 14, measuring 1.0 cm. There is scattered volume loss in the lungs. Dependent atelectasis bilaterally. No pneumothorax. No pleural effusion. Upper Abdomen: Simple cysts in the left kidney. Musculoskeletal: No vertebral compression deformity. Mild scoliosis of the thoracic spine. Review of the MIP images confirms the above findings. IMPRESSION: The study is positive for acute pulmonary thromboembolism in the right middle and lower lobes. This affects segmental and subsegmental branches. Possible 1.0 cm left lower lobe pulmonary nodule. Electronically Signed   By: Marybelle Killings M.D.   On: 07/19/2016 13:36   Ct Angio Chest Pe W And/or Wo Contrast  Result Date: 07/04/2016 CLINICAL DATA:  Positive D-dimer. History of hypertension and arrhythmias. EXAM: CT ANGIOGRAPHY CHEST WITH CONTRAST TECHNIQUE: Multidetector CT imaging of the chest was performed using the standard protocol during bolus administration of intravenous contrast. Multiplanar CT image reconstructions and  MIPs were obtained to evaluate the vascular anatomy. CONTRAST:  100 mL Isovue 370 COMPARISON:  None. FINDINGS: Cardiovascular: Examination of the pulmonary arteries is somewhat limited by contrast bolus and motion artifact but there is no definite evidence of significant pulmonary embolus. Normal heart size. No pericardial effusion. Calcification of the aorta and coronary arteries. Mediastinum/Nodes: No enlarged mediastinal, hilar, or axillary lymph nodes. Thyroid gland, trachea, and esophagus demonstrate no significant findings. Lungs/Pleura: Atelectasis in the lung bases. Emphysematous changes particularly in the upper lungs. Slight fibrosis. No focal  consolidation. No pleural effusions. No pneumothorax. Upper Abdomen: The appearance of the liver suggests hepatic cirrhosis. The lateral segment of the left lobe is enlarged and there appears to be mild lobulation to the contour. The gallbladder is distended and filled with mildly hyperechoic material, likely representing sludge although small stones or milk of calcium could also have this appearance. No gallbladder wall thickening. No bile duct dilatation. Musculoskeletal: No chest wall abnormality. No acute or significant osseous findings. Review of the MIP images confirms the above findings. IMPRESSION: No evidence of significant pulmonary embolus. Emphysematous changes, fibrosis, and atelectasis in the lungs. Aortic atherosclerosis. Electronically Signed   By: Lucienne Capers M.D.   On: 07/04/2016 05:18   Ct Abdomen Pelvis W Contrast  Result Date: 07/19/2016 CLINICAL DATA:  Abdominal pain EXAM: CT ABDOMEN AND PELVIS WITH CONTRAST TECHNIQUE: Multidetector CT imaging of the abdomen and pelvis was performed using the standard protocol following bolus administration of intravenous contrast. Oral contrast was also administered. CONTRAST:  112mL ISOVUE-300 IOPAMIDOL (ISOVUE-300) INJECTION 61% COMPARISON:  CT abdomen and pelvis July 24, 2014 FINDINGS: Lower chest: There is bibasilar lung atelectasis. Heart is mildly enlarged. There is a focal area of decreased attenuation in the region of the left ventricle seen on axial slice 1 series 2 measuring 1.5 x 1.4 cm. There is a questionable pulmonary embolus in a right lower lobe pulmonary artery seen on axial slice 1 series 2. Hepatobiliary: There is an infiltrative appearing mass in the anterior segment of the right lobe of the liver measuring 7.7 x 4.9 cm. No other focal liver lesion is evident. There is extensive sludge in the gallbladder. An underlying mass in the gallbladder cannot be excluded. The gallbladder wall appears rather prominent by CT. There is no  appreciable biliary duct dilatation. Pancreas: No pancreatic mass or inflammatory focus evident. Spleen: No splenic lesions are evident. Adrenals/Urinary Tract: Adrenals appear unremarkable bilaterally. There are two cysts arising from the upper pole of the left kidney eccentrically measuring just over 1 cm in size each. No non cystic liver lesions are evident. There is no hydronephrosis on either side. There is fetal lobulation on each side, an anatomic variant. There is no appreciable renal or ureteral calculus on either side. Urinary bladder is midline with wall thickness within normal limits. Stomach/Bowel: There are scattered colonic diverticula, primarily in the sigmoid region without diverticulitis. There is fairly diffuse stool throughout much of the colon. There is a right-sided femoral hernia which contains small bowel but no bowel compromise. There is no bowel dilatation or air-fluid level suggesting bowel obstruction. No free air or portal venous air is evident. Vascular/Lymphatic: There is extensive atherosclerotic calcification in the aorta and major pelvic arterial vessels diffusely. No aneurysms are evident. Major mesenteric arterial vessels appear patent with moderate atherosclerotic calcification noted, particularly at the origin of the left renal artery. There is periportal region adenopathy. The largest of the periportal lymph nodes measures 4.4 x 2.3 cm. There is  no adenopathy distant to the periportal region. Reproductive: Uterus appears absent. No pelvic mass or pelvic fluid collection evident. Other: Appendix not appreciable. No periappendiceal region inflammation. No abscess or ascites evident in the abdomen or pelvis. Musculoskeletal: There is extensive degenerative change in the lumbar spine. There is mild anterolisthesis of L4 on L5 and mild anterolisthesis of L5 on S1, findings present previously. There are no blastic or lytic bone lesions. There is no intramuscular or abdominal wall  lesion. IMPRESSION: Large liver lesion felt to represent neoplastic focus. There is extensive periportal adenopathy. The gallbladder contains sludge with questionable mass within the gallbladder. Gallbladder wall appears borderline thickened. These findings raise concern for possible carcinoma arising from the gallbladder with liver metastasis and periportal adenopathy. Primary liver carcinoma is a differential consideration. Given the proclivity of colon carcinoma to metastasize to the liver, it may be prudent to consider direct visualization of the colon in this regard. Questionable pulmonary embolus in a right lower lobe pulmonary artery branch. This finding may warrant CT angiogram of the chest for further assessment. Questionable lesion in the left ventricle. This finding may warrant echocardiography to further assess. There is extensive aortic atherosclerosis as well as atherosclerosis throughout the major pelvic vessels. No aneurysm evident. Right-sided femoral hernia containing small bowel but no bowel compromise. Critical Value/emergent results were called by telephone at the time of interpretation on 07/19/2016 at 10:09 am to [place], [place] , who verbally acknowledged these results. Electronically Signed   By: Lowella Grip III M.D.   On: 07/19/2016 10:10   Dg Chest Port 1 View  Result Date: 07/04/2016 CLINICAL DATA:  Chest pain tonight.  Bradycardia. EXAM: PORTABLE CHEST 1 VIEW COMPARISON:  06/17/2016 FINDINGS: Shallow inspiration. Linear atelectasis in the lung bases. No focal airspace disease or consolidation. No blunting of costophrenic angles. No pneumothorax. Calcification of the aorta. Degenerative changes in the spine and shoulders. IMPRESSION: Linear atelectasis in the lung bases.  No focal consolidation. Electronically Signed   By: Lucienne Capers M.D.   On: 07/04/2016 02:04    Time Spent in minutes  25   Louellen Molder M.D on 07/21/2016 at 1:57 PM  Between 7am to 7pm  - Pager - [phone]  After 7pm go to [email or web address] - password [email or web address]  [facility] -  Office  [phone]

## 2016-07-21 NOTE — Progress Notes (Signed)
     [facility] Progress Note  Chief Complaint:  Anemia, liver lesion  Subjective: No complaints today.   Objective:  Vital signs in last 24 hours: Temp:  [98 F (36.7 C)-99.3 F (37.4 C)] 98 F (36.7 C) (11/29 0625) Pulse Rate:  [71-92] 71 (11/29 0625) Resp:  [16-18] 18 (11/29 0625) BP: (125-139)/(58-88) 130/58 (11/29 0625) SpO2:  [93 %-95 %] 93 % (11/29 0625) Last BM Date: 07/20/16 General:   Alert, obese white female  in NAD EENT:  Normal hearing, non icteric sclera, conjunctive pink.  Heart:  Regular rate and rhythm; no murmurs. no lower extremity edema Abdomen:  Soft, nondistended, nontender.  Normal bowel sounds.    Neurologic:  Alert and  oriented x4;  grossly normal neurologically. Psych:  Alert and cooperative. Flat affect.  Intake/Output from previous day: 11/28 0701 - 11/29 0700 In: 1028 [P.O.:720; I.V.:308] Out: -  Intake/Output this shift: No intake/output data recorded.  Lab Results:  Recent Labs  07/20/16 1055 07/20/16 1718 07/21/16 0530  WBC 7.8 8.3 7.4  HGB 8.4* 8.1* 8.0*  HCT 26.4* 25.7* 25.4*  PLT 293 298 286   BMET  Recent Labs  07/19/16 0705 07/20/16 0058 07/21/16 0530  NA 135 133* 135  K 4.0 3.9 3.8  CL 102 101 102  CO2 25 28 27   GLUCOSE 124* 109* 98  BUN 16 12 11   CREATININE 0.77 0.87 0.73  CALCIUM 9.6 8.9 8.7*   LFT  Recent Labs  07/19/16 0705  PROT 6.9  ALBUMIN 3.1*  AST 21  ALT 13*  ALKPHOS 98  BILITOT 0.7   PT/INR  Recent Labs  07/19/16 1516  LABPROT 13.7  INR 1.05    Assessment / Plan:  Hemoccult positive, normocytic anemia. Over last 4 months hgb dropped from 11.7 to 8.4. No overt GI bleeding. No abdominal pain, bowel changes or vomiting. Weight down involuntarily 20+ pounds.  -continue PPI, transfuse as needed.  -no endoscopic workup planned for now.  Large right liver lesion, known gallbladder mass on ultrasound in May.  -Ordered MRI yesterday to better characterize lesion. Will  follow up on results  Right lobe PE, on heparin gtt.   Active Problems:   Essential hypertension   COPD (chronic obstructive pulmonary disease) (HCC)   Osteoarthritis   Chronic back pain   VERTIGO   Depression   Atrial fibrillation ([place] )   Pulmonary embolism ([place])    LOS: 2 days   Tye Savoy  07/21/2016, 9:21 AM Pager number [phone]  GI ATTENDING  Interval history and data reviewed. Daughter in room. Patient in bathroom. Agree with interval progress note. MRI strongly suggest metastatic gallbladder cancer (reviewed after relieving patient's room). Recommend oncology consultation, though I'm not sure what they might have to offer her. Currently, pain control improved. Pain control remains a main objective for her comfort. On anticoagulation for pulmonary embolism. Poor prognosis, unfortunately. Nothing further to offer from GI standpoint. Will sign off.  Docia Chuck. Geri [place]., M.D. [facility]

## 2016-07-22 DIAGNOSIS — G8929 Other chronic pain: Secondary | ICD-10-CM

## 2016-07-22 DIAGNOSIS — I2699 Other pulmonary embolism without acute cor pulmonale: Principal | ICD-10-CM

## 2016-07-22 DIAGNOSIS — C23 Malignant neoplasm of gallbladder: Secondary | ICD-10-CM

## 2016-07-22 DIAGNOSIS — R079 Chest pain, unspecified: Secondary | ICD-10-CM

## 2016-07-22 LAB — CBC
HEMATOCRIT: 25.3 % — AB (ref 36.0–46.0)
HEMOGLOBIN: 7.9 g/dL — AB (ref 12.0–15.0)
MCH: 28.2 pg (ref 26.0–34.0)
MCHC: 31.2 g/dL (ref 30.0–36.0)
MCV: 90.4 fL (ref 78.0–100.0)
Platelets: 273 10*3/uL (ref 150–400)
RBC: 2.8 MIL/uL — AB (ref 3.87–5.11)
RDW: 18.1 % — ABNORMAL HIGH (ref 11.5–15.5)
WBC: 6.1 10*3/uL (ref 4.0–10.5)

## 2016-07-22 LAB — AFP TUMOR MARKER: AFP-Tumor Marker: 4 ng/mL (ref 0.0–8.3)

## 2016-07-22 LAB — HEPARIN LEVEL (UNFRACTIONATED): Heparin Unfractionated: 0.41 IU/mL (ref 0.30–0.70)

## 2016-07-22 LAB — CEA: CEA: 146.4 ng/mL — ABNORMAL HIGH (ref 0.0–4.7)

## 2016-07-22 MED ORDER — MORPHINE SULFATE (PF) 4 MG/ML IV SOLN
4.0000 mg | INTRAVENOUS | Status: DC | PRN
  Administered 2016-07-22 (×2): 4 mg via INTRAVENOUS
  Filled 2016-07-22 (×2): qty 1

## 2016-07-22 MED ORDER — OXYCODONE HCL 5 MG PO TABS
10.0000 mg | ORAL_TABLET | ORAL | Status: DC | PRN
  Administered 2016-07-22 – 2016-07-27 (×12): 10 mg via ORAL
  Filled 2016-07-22 (×12): qty 2

## 2016-07-22 MED ORDER — HYDROMORPHONE HCL 2 MG/ML IJ SOLN
2.0000 mg | Freq: Once | INTRAMUSCULAR | Status: AC
  Administered 2016-07-22: 2 mg via INTRAVENOUS
  Filled 2016-07-22: qty 1

## 2016-07-22 NOTE — Plan of Care (Signed)
Problem: Pain Managment: Goal: General experience of comfort will improve Outcome: Progressing Patient pain has not improved

## 2016-07-22 NOTE — Consult Note (Signed)
Chief Complaint: Patient was seen in consultation today for image guided liver lesion biopsy Chief Complaint  Patient presents with  . Abdominal Pain    Referring Physician(s):  Dhungel,N  Supervising Physician: Daryll Brod  Patient Status: [facility] - In-pt  History of Present Illness: [name] is a 80 y.o. female with history of atrial fibrillation, polymyalgia rheumatica, IBS, COPD, MGUS recently admitted to [facility] with weight loss and abdominal pain. Subsequent imaging revealed acute PE in the right middle and lower lobes, left lower lobe pulmonary nodule, tumor in gallbladder fundus as well as liver lesions and upper abdominal metastatic lymphadenopathy. 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  Request received from primary care team for image guided liver lesion biopsy for further evaluation.  Past Medical History:  Diagnosis Date  . Chronic pain    Managed by Dr. Hardin Negus  . Diverticulitis   . Hyperlipidemia   . Hypertension   . IBS (irritable bowel syndrome)   . Palpitations   . Polymyalgia rheumatica ([place])   . PVC's (premature ventricular contractions)   . SVT (supraventricular tachycardia) ([place])   . Tobacco dependence     Past Surgical History:  Procedure Laterality Date  . ABDOMINAL HYSTERECTOMY Bilateral   . NASAL SINUS SURGERY    . REPLACEMENT TOTAL KNEE    . TONSILLECTOMY    . WRIST FRACTURE SURGERY      Allergies: Amoxicillin-pot clavulanate; Aspirin; Atorvastatin; Carisoprodol; Codeine; Codeine phosphate; Cortisone; Duoderm hydroactive; Ezetimibe-simvastatin; Naproxen sodium; Omeprazole; Quinapril hcl; Rofecoxib; Silvadene [silver sulfadiazine]; and Xarelto [rivaroxaban]  Medications: Prior to Admission medications   Medication Sig Start Date End Date Taking? Authorizing Provider  levothyroxine (SYNTHROID, LEVOTHROID) 88 MCG tablet Take 1 tablet (88 mcg total) by mouth daily. 06/25/16  Yes Abner Greenspan, MD  meclizine  (ANTIVERT) 25 MG tablet TAKE 1 TABLET (25 MG TOTAL) BY MOUTH 3 (THREE) TIMES DAILY AS NEEDED FOR DIZZINESS. 03/14/15  Yes Abner Greenspan, MD  metoprolol succinate (TOPROL-XL) 50 MG 24 hr tablet Take 3 tablets (150 mg total) by mouth daily. 06/24/16  Yes Costin Karlyne Greenspan, MD  morphine (MS CONTIN) 15 MG 12 hr tablet Take 1 tablet by mouth daily with breakfast.  01/02/16  Yes Historical Provider, MD  morphine (MS CONTIN) 30 MG 12 hr tablet Take 1 tablet by mouth at bedtime.  01/02/16  Yes Historical Provider, MD  pantoprazole (PROTONIX) 40 MG tablet TAKE 1 TABLET (40 MG TOTAL) BY MOUTH DAILY. 08/26/15  Yes [place], MD  predniSONE (DELTASONE) 1 MG tablet TAKE 4 TABLETS BY MOUTH DAILLY IN THE MORNING IN [place], 3 DAILY SEPTEMBER, 2 DAILY OCTOBER Patient taking differently: Take 1 tablet once daily. 06/23/16  Yes Bo Merino, MD  sertraline (ZOLOFT) 50 MG tablet TAKE 1 TABLET BY MOUTH EVERY DAY Patient taking differently: TAKE 1 TABLET BY MOUTH EVERY DAY as needed for depression 03/08/16  Yes Abner Greenspan, MD  trolamine salicylate (ASPERCREME) 10 % cream Apply 1 application topically 2 (two) times daily as needed for muscle pain.   Yes Historical Provider, MD     Family History  Problem Relation Age of Onset  . Diabetes Mother   . Heart attack Father   . Heart disease Sister     Social History   Social History  . Marital status: Married    Spouse name: N/A  . Number of children: 2  . Years of education: N/A   Occupational History  .  Retired   Science writer  History Main Topics  . Smoking status: Former Smoker    Packs/day: 0.50    Quit date: 12/12/2011  . Smokeless tobacco: Never Used  . Alcohol use No  . Drug use: No  . Sexual activity: Not Currently    Birth control/ protection: None   Other Topics Concern  . None   Social History Narrative  . None      Review of Systems currently denies fever, headache, chest pain, worsening dyspnea, back pain, nausea ,vomiting or abnormal  bleeding.  She does have some right lateral lower chest/upper abdominal discomfort  Vital Signs: BP 140/68   Pulse 68   Temp 97.6 F (36.4 C) (Oral)   Resp 16   Ht 5\' 7"  (1.702 m)   Wt 213 lb (96.6 kg)   SpO2 93%   BMI 33.36 kg/m   Physical Exam patient awake, alert. Chest clear to auscultation bilaterally. Heart with regular rate and rhythm. Abdomen obese, positive bowel sounds, tender right upper quadrant/ right lateral chest region; lower extremities no edema  Mallampati Score:     Imaging: Ct Angio Chest Pe W And/or Wo Contrast  Result Date: 07/19/2016 CLINICAL DATA:  Abnormal CT abdomen and pelvis.  Abdominal pain. EXAM: CT ANGIOGRAPHY CHEST WITH CONTRAST TECHNIQUE: Multidetector CT imaging of the chest was performed using the standard protocol during bolus administration of intravenous contrast. Multiplanar CT image reconstructions and MIPs were obtained to evaluate the vascular anatomy. CONTRAST:  100 cc Isovue 370 COMPARISON:  07/04/2016 FINDINGS: Cardiovascular: There is low-density filling defect within the right pulmonary artery segmental branch of the lateral basal segment of the right lower lobe. It extends into subsegmental branches. This is compatible with pulmonary thromboembolism. There is also segmental and subsegmental pulmonary thromboembolism at the base of the right middle lobe. There are no central filling defects in the pulmonary arterial tree. No evidence of left pulmonary thromboembolism. Coronary artery calcifications. Mediastinum/Nodes: No abnormal mediastinal adenopathy. Right infrahilar lymphoid tissue there is stable and mildly prominent. Lungs/Pleura: Advanced apical centrilobular emphysema. There is a questionable nodular density in the superior segment of the left lower lobe on image 42 of series 14, measuring 1.0 cm. There is scattered volume loss in the lungs. Dependent atelectasis bilaterally. No pneumothorax. No pleural effusion. Upper Abdomen: Simple  cysts in the left kidney. Musculoskeletal: No vertebral compression deformity. Mild scoliosis of the thoracic spine. Review of the MIP images confirms the above findings. IMPRESSION: The study is positive for acute pulmonary thromboembolism in the right middle and lower lobes. This affects segmental and subsegmental branches. Possible 1.0 cm left lower lobe pulmonary nodule. Electronically Signed   By: Marybelle Killings M.D.   On: 07/19/2016 13:36   Ct Angio Chest Pe W And/or Wo Contrast  Result Date: 07/04/2016 CLINICAL DATA:  Positive D-dimer. History of hypertension and arrhythmias. EXAM: CT ANGIOGRAPHY CHEST WITH CONTRAST TECHNIQUE: Multidetector CT imaging of the chest was performed using the standard protocol during bolus administration of intravenous contrast. Multiplanar CT image reconstructions and MIPs were obtained to evaluate the vascular anatomy. CONTRAST:  100 mL Isovue 370 COMPARISON:  None. FINDINGS: Cardiovascular: Examination of the pulmonary arteries is somewhat limited by contrast bolus and motion artifact but there is no definite evidence of significant pulmonary embolus. Normal heart size. No pericardial effusion. Calcification of the aorta and coronary arteries. Mediastinum/Nodes: No enlarged mediastinal, hilar, or axillary lymph nodes. Thyroid gland, trachea, and esophagus demonstrate no significant findings. Lungs/Pleura: Atelectasis in the lung bases. Emphysematous changes particularly in the  upper lungs. Slight fibrosis. No focal consolidation. No pleural effusions. No pneumothorax. Upper Abdomen: The appearance of the liver suggests hepatic cirrhosis. The lateral segment of the left lobe is enlarged and there appears to be mild lobulation to the contour. The gallbladder is distended and filled with mildly hyperechoic material, likely representing sludge although small stones or milk of calcium could also have this appearance. No gallbladder wall thickening. No bile duct dilatation.  Musculoskeletal: No chest wall abnormality. No acute or significant osseous findings. Review of the MIP images confirms the above findings. IMPRESSION: No evidence of significant pulmonary embolus. Emphysematous changes, fibrosis, and atelectasis in the lungs. Aortic atherosclerosis. Electronically Signed   By: Lucienne Capers M.D.   On: 07/04/2016 05:18   Mr Abdomen W Wo Contrast  Result Date: 07/21/2016 CLINICAL DATA:  Followup abnormal CT scan of the abdomen demonstrating a gallbladder mass and hepatic metastatic disease. EXAM: MRI ABDOMEN WITHOUT AND WITH CONTRAST TECHNIQUE: Multiplanar multisequence MR imaging of the abdomen was performed both before and after the administration of intravenous contrast. CONTRAST:  59mL MULTIHANCE GADOBENATE DIMEGLUMINE 529 MG/ML IV SOLN COMPARISON:  CT scan 07/19/2016 FINDINGS: Lower chest: The lung bases are grossly clear. No pleural or pericardial effusion. Hepatobiliary: Numerous hepatic lesions are again demonstrated. The largest lesion is in segment 5 and measures 49 mm. There are 3 lesions and segment 4B the largest 2 lesions measure 26 mm and a smaller lesion measures 12 mm. These are all diffusion positive consistent with neoplasm. Enhancing tumor in the fundal region of the gallbladder is also diffusion positive and consistent with gallbladder cancer with hepatic metastatic disease. There are also enlarged periportal and celiac axis lymph nodes as demonstrated on the CT scan which are diffusion positive. Pancreas: No mass, inflammation or ductal dilatation. Peripancreatic adenopathy is again demonstrated. Spleen:  Normal size.  No focal lesions. Adrenals/Urinary Tract: The adrenal glands and kidneys are unremarkable. Stomach/Bowel: The stomach, duodenum, visualized small bowel and visualized colon are unremarkable. Vascular/Lymphatic: Advanced atherosclerotic calcifications involving the aorta and iliac arteries. No aneurysm. Enlarged metastatic periportal,  celiac axis, peripancreatic and retroperitoneal lymph nodes. Other:  No ascites or abdominal wall hernia. Musculoskeletal: No significant bony findings IMPRESSION: 1. Enhancing and diffusion positive tumor in the gallbladder fundus consistent with gallbladder carcinoma. 2. Hepatic metastatic disease and upper abdominal metastatic lymphadenopathy. Electronically Signed   By: Marijo Sanes M.D.   On: 07/21/2016 15:02   Ct Abdomen Pelvis W Contrast  Result Date: 07/19/2016 CLINICAL DATA:  Abdominal pain EXAM: CT ABDOMEN AND PELVIS WITH CONTRAST TECHNIQUE: Multidetector CT imaging of the abdomen and pelvis was performed using the standard protocol following bolus administration of intravenous contrast. Oral contrast was also administered. CONTRAST:  165mL ISOVUE-300 IOPAMIDOL (ISOVUE-300) INJECTION 61% COMPARISON:  CT abdomen and pelvis July 24, 2014 FINDINGS: Lower chest: There is bibasilar lung atelectasis. Heart is mildly enlarged. There is a focal area of decreased attenuation in the region of the left ventricle seen on axial slice 1 series 2 measuring 1.5 x 1.4 cm. There is a questionable pulmonary embolus in a right lower lobe pulmonary artery seen on axial slice 1 series 2. Hepatobiliary: There is an infiltrative appearing mass in the anterior segment of the right lobe of the liver measuring 7.7 x 4.9 cm. No other focal liver lesion is evident. There is extensive sludge in the gallbladder. An underlying mass in the gallbladder cannot be excluded. The gallbladder wall appears rather prominent by CT. There is no appreciable biliary duct dilatation.  Pancreas: No pancreatic mass or inflammatory focus evident. Spleen: No splenic lesions are evident. Adrenals/Urinary Tract: Adrenals appear unremarkable bilaterally. There are two cysts arising from the upper pole of the left kidney eccentrically measuring just over 1 cm in size each. No non cystic liver lesions are evident. There is no hydronephrosis on either  side. There is fetal lobulation on each side, an anatomic variant. There is no appreciable renal or ureteral calculus on either side. Urinary bladder is midline with wall thickness within normal limits. Stomach/Bowel: There are scattered colonic diverticula, primarily in the sigmoid region without diverticulitis. There is fairly diffuse stool throughout much of the colon. There is a right-sided femoral hernia which contains small bowel but no bowel compromise. There is no bowel dilatation or air-fluid level suggesting bowel obstruction. No free air or portal venous air is evident. Vascular/Lymphatic: There is extensive atherosclerotic calcification in the aorta and major pelvic arterial vessels diffusely. No aneurysms are evident. Major mesenteric arterial vessels appear patent with moderate atherosclerotic calcification noted, particularly at the origin of the left renal artery. There is periportal region adenopathy. The largest of the periportal lymph nodes measures 4.4 x 2.3 cm. There is no adenopathy distant to the periportal region. Reproductive: Uterus appears absent. No pelvic mass or pelvic fluid collection evident. Other: Appendix not appreciable. No periappendiceal region inflammation. No abscess or ascites evident in the abdomen or pelvis. Musculoskeletal: There is extensive degenerative change in the lumbar spine. There is mild anterolisthesis of L4 on L5 and mild anterolisthesis of L5 on S1, findings present previously. There are no blastic or lytic bone lesions. There is no intramuscular or abdominal wall lesion. IMPRESSION: Large liver lesion felt to represent neoplastic focus. There is extensive periportal adenopathy. The gallbladder contains sludge with questionable mass within the gallbladder. Gallbladder wall appears borderline thickened. These findings raise concern for possible carcinoma arising from the gallbladder with liver metastasis and periportal adenopathy. Primary liver carcinoma is a  differential consideration. Given the proclivity of colon carcinoma to metastasize to the liver, it may be prudent to consider direct visualization of the colon in this regard. Questionable pulmonary embolus in a right lower lobe pulmonary artery branch. This finding may warrant CT angiogram of the chest for further assessment. Questionable lesion in the left ventricle. This finding may warrant echocardiography to further assess. There is extensive aortic atherosclerosis as well as atherosclerosis throughout the major pelvic vessels. No aneurysm evident. Right-sided femoral hernia containing small bowel but no bowel compromise. Critical Value/emergent results were called by telephone at the time of interpretation on 07/19/2016 at 10:09 am to Guys II, PA , who verbally acknowledged these results. Electronically Signed   By: Lowella Grip III M.D.   On: 07/19/2016 10:10   Dg Chest Port 1 View  Result Date: 07/04/2016 CLINICAL DATA:  Chest pain tonight.  Bradycardia. EXAM: PORTABLE CHEST 1 VIEW COMPARISON:  06/17/2016 FINDINGS: Shallow inspiration. Linear atelectasis in the lung bases. No focal airspace disease or consolidation. No blunting of costophrenic angles. No pneumothorax. Calcification of the aorta. Degenerative changes in the spine and shoulders. IMPRESSION: Linear atelectasis in the lung bases.  No focal consolidation. Electronically Signed   By: Lucienne Capers M.D.   On: 07/04/2016 02:04    Labs:  CBC:  Recent Labs  07/20/16 1055 07/20/16 1718 07/21/16 0530 07/22/16 0521  WBC 7.8 8.3 7.4 6.1  HGB 8.4* 8.1* 8.0* 7.9*  HCT 26.4* 25.7* 25.4* 25.3*  PLT 293 298 286 273  COAGS:  Recent Labs  07/19/16 1516 07/20/16 0058 07/20/16 1055 07/20/16 2106  INR 1.05  --   --   --   APTT 32 133* 93* 68*    BMP:  Recent Labs  07/04/16 0056 07/19/16 0705 07/20/16 0058 07/21/16 0530  NA 138 135 133* 135  K 3.6 4.0 3.9 3.8  CL 104 102 101 102  CO2 24 25 28 27     GLUCOSE 106* 124* 109* 98  BUN 16 16 12 11   CALCIUM 9.0 9.6 8.9 8.7*  CREATININE 0.80 0.77 0.87 0.73  GFRNONAA >60 >60 >60 >60  GFRAA >60 >60 >60 >60    LIVER FUNCTION TESTS:  Recent Labs  06/17/16 0305 06/19/16 0128 06/21/16 0308 07/19/16 0705  BILITOT 0.9 0.6 0.6 0.7  AST 22 16 33 21  ALT 9* 10* 16 13*  ALKPHOS 60 51 60 98  PROT 6.9 5.1* 5.5* 6.9  ALBUMIN 2.9* 2.1* 2.4* 3.1*    TUMOR MARKERS:  Recent Labs  07/21/16 1700  AFPTM 4.0  CEA 146.4*    Assessment and Plan: 80 y.o. female with history of atrial fibrillation, polymyalgia rheumatica, IBS, COPD, MGUS recently admitted to [facility] with weight loss and abdominal pain. Subsequent imaging revealed acute PE in the right middle and lower lobes, left lower lobe pulmonary nodule, tumor in gallbladder fundus as well as liver lesions and upper abdominal metastatic lymphadenopathy. 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  Request received from primary care team for image guided liver lesion biopsy for further evaluation. Risks and benefits discussed with the patient including, but not limited to bleeding, infection, damage to adjacent structures or low yield requiring additional tests.All of the patient's questions were answered, patient is agreeable to proceed. Consent signed and in chart. IV heparin will  need to be stopped 4 hours prior to the procedure. Procedure tent scheduled for 12/1.     Thank you for this interesting consult.  I greatly enjoyed meeting JULIANNY KARY and look forward to participating in their care.  A copy of this report was sent to the requesting provider on this date.  Electronically Signed: D. Rowe Robert 07/22/2016, 5:17 PM   I spent a total of 25 minutes    in face to face in clinical consultation, greater than 50% of which was counseling/coordinating care for image guided liver lesion biopsy

## 2016-07-22 NOTE — Progress Notes (Signed)
ANTICOAGULATION CONSULT NOTE - Follow Up Consult  Pharmacy Consult for Heparin Indication: pulmonary embolus  Allergies  Allergen Reactions  . Amoxicillin-Pot Clavulanate Diarrhea    Has patient had a PCN reaction causing immediate rash, facial/tongue/throat swelling, SOB or lightheadedness with hypotension: No Has patient had a PCN reaction causing severe rash involving mucus membranes or skin necrosis: No Has patient had a PCN reaction that required hospitalization No Has patient had a PCN reaction occurring within the last 10 years: Yes If all of the above answers are "NO", then may proceed with Cephalosporin use.   . Aspirin Nausea And Vomiting  . Atorvastatin     REACTION: feel weak  . Carisoprodol     REACTION: intolerant  . Codeine     REACTION: hallucinations  . Codeine Phosphate     REACTION: UNKNOWN  . Cortisone     REACTION: pt. reports allergy, reaction not known  . Duoderm Hydroactive     Skin irritation and pain     . Ezetimibe-Simvastatin     REACTION: feels weak  . Naproxen Sodium     REACTION: GI symptoms  . Omeprazole     REACTION: abd pain  . Quinapril Hcl     REACTION: reports allergy, reaction not known  . Rofecoxib     REACTION: reports allergy, reaction not known  . Silvadene [Silver Sulfadiazine]     Skin irritation and pain   . Xarelto [Rivaroxaban] Other (See Comments)    Pain     Patient Measurements: Height: 5\' 7"  (170.2 cm) Weight: 213 lb (96.6 kg) IBW/kg (Calculated) : 61.6 Heparin Dosing Weight: 83kg  Vital Signs: Temp: 97.6 F (36.4 C) (11/30 0552) Temp Source: Oral (11/30 0552) BP: 112/60 (11/30 0552) Pulse Rate: 66 (11/30 0552)  Labs:  Recent Labs  07/19/16 1516  07/20/16 0058 07/20/16 1055 07/20/16 1718 07/20/16 2106 07/21/16 0530 07/22/16 0521  HGB  --   < > 7.9* 8.4* 8.1*  --  8.0* 7.9*  HCT  --   < > 25.1* 26.4* 25.7*  --  25.4* 25.3*  PLT  --   < > 271 293 298  --  286 273  APTT 32  --  133* 93*  --  68*  --    --   LABPROT 13.7  --   --   --   --   --   --   --   INR 1.05  --   --   --   --   --   --   --   HEPARINUNFRC 0.54  --  1.02* 0.94*  --  0.58 0.42 0.41  CREATININE  --   --  0.87  --   --   --  0.73  --   < > = values in this interval not displayed.  Estimated Creatinine Clearance: 69.2 mL/min (by C-G formula based on SCr of 0.73 mg/dL).   Medications:  Infusions:  . heparin 900 Units/hr (07/21/16 1800)    Assessment: 65 yoF with PMHx polymyalgia rheumatica, chronic pain, IBS, diverticulitis, afib with SVT and PVCs, HTN/HLD, anemia, COPD and obesity presents with right sided abdominal pain.  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  Pharmacy consulted to start IV heparin.  Discussed case with PA as pt has positive FOB and hemoglobin has dropped 10.2 >> 8.5 in the last two weeks.  Will begin very conservatively with heparin and omit bolus.    Patient  was on xarelto but stopped d/t GI intolerance, Cardiology started her on Eliquis but unable to afford. Patient states she has not been on anticoagulation for ~ 1 week.   Today, 07/22/2016:  Heparin level therapeutic on 900 units/hr  CBC: repeat Hgb low/stable, pltc WNL  FOBT was positive; GI signed off as no interventions warranted other than blood/meds   No obvious bleeding  Goal of Therapy:  Heparin level 0.3-0.7 units/ml, prefer 0.3-0.5 until rule out GIB Monitor platelets by anticoagulation protocol: Yes   Plan:   Continue heparin at 900 units/hr  Repeat heparin level at this afternoon, level trending down.  Daily CBC and heparin level  Likely transition to warfarin when appropriate.  May need liver or gallbladder biopsy (Onc consulted, GI s/o)  Reuel Boom, PharmD, BCPS Pager: [phone] 07/22/2016, 7:35 AM

## 2016-07-22 NOTE — Evaluation (Signed)
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 PMH:  SVT, urinary incontinence, wound Lt medial thigh to buttocks, COPD, HTN, chronic back pain, polymyalgia rheumatica, depression  Clinical Impression  Pt admitted with above diagnosis. Pt currently with functional limitations due to the deficits listed below (see PT Problem List).  Pt will benefit from skilled PT to increase their independence and safety with mobility to allow discharge to the venue listed below.  Pt assisted with ambulating in hallway using RW and SPO2 dropped to 87% on room air (RN notified).  Pt encouraged to ambulate with staff during acute stay.    Follow Up Recommendations Home health PT;Supervision for mobility/OOB    Equipment Recommendations  None recommended by PT    Recommendations for Other Services       Precautions / Restrictions Precautions Precautions: Fall Precaution Comments: monitor sats      Mobility  Bed Mobility Overal bed mobility: Needs Assistance Bed Mobility: Supine to Sit;Sit to Supine     Supine to sit: Supervision Sit to supine: Min assist   General bed mobility comments: assist for LEs per request of pt upon return to bed due to fatigue  Transfers Overall transfer level: Needs assistance Equipment used: Rolling walker (2 wheeled) Transfers: Sit to/from Stand Sit to Stand: Min guard         General transfer comment: verbal cues for hand placement  Ambulation/Gait Ambulation/Gait assistance: Min guard Ambulation Distance (Feet): 80 Feet Assistive device: Rolling walker (2 wheeled) Gait Pattern/deviations: Step-through pattern;Decreased stride length;Trunk flexed     General Gait Details: verbal cues for safe use of RW,  SpO2 dropped to 87% during ambulation (reapplied 1.5 L O2 [place] upon return to bed)  Stairs            Wheelchair Mobility    Modified Rankin (Stroke Patients Only)       Balance                                             Pertinent Vitals/Pain Pain Assessment: 0-10 Pain Score: 5  Pain Location: abdomen Pain Intervention(s): Limited activity within patient's tolerance;Monitored during session;Premedicated before session;Repositioned    Home Living Family/patient expects to be discharged to:: Private residence Living Arrangements: Spouse/significant other Available Help at Discharge: Family;Available 24 hours/day Type of Home: House Home Access: Ramped entrance     Home Layout: One level Home Equipment: Walker - 2 wheels;Cane - single point;Shower seat;Bedside commode;Hand held shower head;Wheelchair - manual      Prior Function Level of Independence: Independent with assistive device(s)         Comments: states she doesn't use assistive device, has SPC and RW and was using these upon last admission one month ago     Hand Dominance        Extremity/Trunk Assessment               Lower Extremity Assessment: Generalized weakness         Communication   Communication: No difficulties  Cognition Arousal/Alertness: Awake/alert Behavior During Therapy: WFL for tasks assessed/performed Overall Cognitive Status: Within Functional Limits for tasks assessed  General Comments      Exercises     Assessment/Plan    PT Assessment Patient needs continued PT services  PT Problem List Decreased strength;Decreased activity tolerance;Decreased balance;Decreased knowledge of use of DME;Decreased mobility;Pain          PT Treatment Interventions DME instruction;Gait training;Functional mobility training;Therapeutic exercise;Therapeutic activities;Patient/family education    PT Goals (Current goals can be  found in the Care Plan section)  Acute Rehab PT Goals PT Goal Formulation: With patient Time For Goal Achievement: 07/29/16 Potential to Achieve Goals: Good    Frequency Min 3X/week   Barriers to discharge        Co-evaluation               End of Session Equipment Utilized During Treatment: Oxygen Activity Tolerance: Patient tolerated treatment well Patient left: in bed;with call bell/phone within reach;with bed alarm set;with family/visitor present Nurse Communication: Mobility status         Time: [id number] PT Time Calculation (min) (ACUTE ONLY): 16 min   Charges:   PT Evaluation $PT Eval Low Complexity: 1 Procedure     PT G Codes:        Fynley Chrystal,KATHrine E 07/22/2016, 3:22 PM Carmelia Bake, PT, DPT 07/22/2016 Pager: [phone]

## 2016-07-22 NOTE — Progress Notes (Signed)
PROGRESS NOTE                                                                                                                                                                                                             Patient Demographics:    [name], is a 80 y.o. female, DOB - [birth year], [id number]  Admit date - 07/19/2016   Admitting Physician Velvet Bathe, MD  Outpatient Primary MD for the patient is [name], MD  LOS - 3  Outpatient Specialists: Dr Irene Limbo  Chief Complaint  Patient presents with  . Abdominal Pain       Brief Narrative   80 year old female with history of A. fib on anticoagulation, polymyalgia rheumatica on low-dose prednisone, IBS, COPD, MGUS (follows with Dr. Irene Limbo) presented with abdominal discomfort for last few days. She has also had almost 20 pound weight loss in the past 3 months. In the ED CT of the abdomen and pelvis showed a large liver lesion with extensive periportal adenopathy. Also showed gallbladder with sludge with questionable mass within the gallbladder and thickened wall. A CT angiogram of the chest done was positive for acute PE in the right middle and lower lobes. (Also showed a 1 cm left lower lobe pulmonary nodule). Patient admitted to hospitalist service and GI consulted.   Subjective:   Still has pain in her right upper quadrant. Discussed MRI findings the patient and daughter again.   Assessment  & Plan :    Principal Problem:   Pulmonary embolism without acute cor pulmonale ([place]) Patient was on Xarelto at home which she has not been taking due to? Cost. Currently on IV heparin. Eliquis is also very Expensive for her. May need Coumadin. Hemoccult-positive. H&H is currently stable.  Holding to start Coumadin until she gets gallbladder/liver biopsy. -appreciated GI eval. No intervention needed.   Active Problems: Possible gallbladder carcinoma with liver  metastases/left lower lobe pulmonary nodule MRI of the abdomen shows enhancing tumor in the gallbladder fundus consistent with gallbladder carcinoma with hepatic metastases and upper abdominal metastatic lymphadenopathy. IR consulted for biopsy. Plan on liver biopsy tomorrow. Should stop her IV heparin 4 hours prior. -AFP negative and CEA markedly elevated (146). - increased oxycodone dose for better pain control, will increase IV morphine dose.. Continue home dose MS Contin.    Anemia of chronic disease  Monitor hemoglobin closely while on heparin drip. Hemoccult positive. No need for colonoscopy at present per GI.  Hypothyroidism Continue Synthroid.  Chronic A. fib Rate controlled with metoprolol. Continue IV heparin for now. Will likely need Coumadin upon discharge due to issue with cost for NOAC.     PMR (polymyalgia rheumatica) (HCC) Continue low-dose prednisone and home MS Contin.     Code Status : Full code  Family Communication  : Daughter at bedside  Disposition Plan  : Home once workup completed  Barriers For Discharge : Active symptoms  Consults  :  [facility]  Procedures  :  CT abdomen, chest with PE protocol MRI abdomen Liver biopsy (pending)  DVT Prophylaxis  : IV heparin  Lab Results  Component Value Date   PLT 273 07/22/2016    Antibiotics  :    Anti-infectives    None        Objective:   Vitals:   07/21/16 2147 07/21/16 2149 07/22/16 0552 07/22/16 0930  BP: (!) 106/51  112/60 140/68  Pulse: 76  66 68  Resp: 16  16   Temp: 98.6 F (37 C)  97.6 F (36.4 C)   TempSrc: Oral  Oral   SpO2: (!) 88% 95% 97% 93%  Weight:      Height:        Wt Readings from Last 3 Encounters:  07/19/16 96.6 kg (213 lb)  07/12/16 97.5 kg (215 lb)  07/04/16 98 kg (216 lb)     Intake/Output Summary (Last 24 hours) at 07/22/16 1332 Last data filed at 07/22/16 0900  Gross per 24 hour  Intake           318.25 ml  Output                0 ml  Net            318.25 ml     Physical Exam  Gen: Appears fatigued HEENT: moist mucosa, supple neck Chest: clear b/l, no added sounds CVS: N S1&S2, no murmurs,  GI: soft, nondistended, bowel sounds present, right upper quadrant tenderness Musculoskeletal: warm, no edema     Data Review:    CBC  Recent Labs Lab 07/19/16 0705  07/20/16 0058 07/20/16 1055 07/20/16 1718 07/21/16 0530 07/22/16 0521  WBC 7.1  < > 7.7 7.8 8.3 7.4 6.1  HGB 8.5*  < > 7.9* 8.4* 8.1* 8.0* 7.9*  HCT 26.5*  < > 25.1* 26.4* 25.7* 25.4* 25.3*  PLT 261  < > 271 293 298 286 273  MCV 87.2  < > 89.0 86.8 86.8 89.4 90.4  MCH 28.0  < > 28.0 27.6 27.4 28.2 28.2  MCHC 32.1  < > 31.5 31.8 31.5 31.5 31.2  RDW 17.7*  < > 18.1* 18.0* 17.9* 18.2* 18.1*  LYMPHSABS 1.3  --   --   --   --   --   --   MONOABS 0.6  --   --   --   --   --   --   EOSABS 0.1  --   --   --   --   --   --   BASOSABS 0.0  --   --   --   --   --   --   < > = values in this interval not displayed.  Chemistries   Recent Labs Lab 07/19/16 0705 07/20/16 0058 07/21/16 0530  NA 135 133* 135  K 4.0 3.9 3.8  CL 102 101 102  CO2 25 28 27   GLUCOSE 124* 109* 98  BUN 16 12 11   CREATININE 0.77 0.87 0.73  CALCIUM 9.6 8.9 8.7*  AST 21  --   --   ALT 13*  --   --   ALKPHOS 98  --   --   BILITOT 0.7  --   --    ------------------------------------------------------------------------------------------------------------------ No results for input(s): CHOL, HDL, LDLCALC, TRIG, CHOLHDL, LDLDIRECT in the last 72 hours.  Lab Results  Component Value Date   HGBA1C 5.7 (H) 06/17/2016   ------------------------------------------------------------------------------------------------------------------ No results for input(s): TSH, T4TOTAL, T3FREE, THYROIDAB in the last 72 hours.  Invalid input(s): FREET3 ------------------------------------------------------------------------------------------------------------------ No results for input(s): VITAMINB12,  FOLATE, FERRITIN, TIBC, IRON, RETICCTPCT in the last 72 hours.  Coagulation profile  Recent Labs Lab 07/19/16 1516  INR 1.05    No results for input(s): DDIMER in the last 72 hours.  Cardiac Enzymes No results for input(s): CKMB, TROPONINI, MYOGLOBIN in the last 168 hours.  Invalid input(s): CK ------------------------------------------------------------------------------------------------------------------    Component Value Date/Time   BNP 197.8 (H) 07/04/2016 0056   BNP 130.2 (H) 09/22/2015 [id number]    Inpatient Medications  Scheduled Meds: .  HYDROmorphone (DILAUDID) injection  2 mg Intravenous Once  . levothyroxine  88 mcg Oral QAC breakfast  . metoprolol succinate  150 mg Oral Daily  . morphine  15 mg Oral Q breakfast  . morphine  30 mg Oral QHS  . pantoprazole  40 mg Oral BID  . predniSONE  1 mg Oral Q breakfast  . sertraline  50 mg Oral Daily  . sodium chloride flush  3 mL Intravenous Q12H  . sodium chloride flush  3 mL Intravenous Q12H   Continuous Infusions: . heparin 900 Units/hr (07/21/16 1800)   PRN Meds:.sodium chloride, meclizine, morphine injection, oxyCODONE, sodium chloride flush  Micro Results No results found for this or any previous visit (from the past 240 hour(s)).  Radiology Reports Ct Angio Chest Pe W And/or Wo Contrast  Result Date: 07/19/2016 CLINICAL DATA:  Abnormal CT abdomen and pelvis.  Abdominal pain. EXAM: CT ANGIOGRAPHY CHEST WITH CONTRAST TECHNIQUE: Multidetector CT imaging of the chest was performed using the standard protocol during bolus administration of intravenous contrast. Multiplanar CT image reconstructions and MIPs were obtained to evaluate the vascular anatomy. CONTRAST:  100 cc Isovue 370 COMPARISON:  07/04/2016 FINDINGS: Cardiovascular: There is low-density filling defect within the right pulmonary artery segmental branch of the lateral basal segment of the right lower lobe. It extends into subsegmental branches. This is  compatible with pulmonary thromboembolism. There is also segmental and subsegmental pulmonary thromboembolism at the base of the right middle lobe. There are no central filling defects in the pulmonary arterial tree. No evidence of left pulmonary thromboembolism. Coronary artery calcifications. Mediastinum/Nodes: No abnormal mediastinal adenopathy. Right infrahilar lymphoid tissue there is stable and mildly prominent. Lungs/Pleura: Advanced apical centrilobular emphysema. There is a questionable nodular density in the superior segment of the left lower lobe on image 42 of series 14, measuring 1.0 cm. There is scattered volume loss in the lungs. Dependent atelectasis bilaterally. No pneumothorax. No pleural effusion. Upper Abdomen: Simple cysts in the left kidney. Musculoskeletal: No vertebral compression deformity. Mild scoliosis of the thoracic spine. Review of the MIP images confirms the above findings. IMPRESSION: The study is positive for acute pulmonary thromboembolism in the right middle and lower lobes. This affects segmental and subsegmental branches. Possible 1.0 cm left lower lobe pulmonary nodule. Electronically Signed  By: Marybelle Killings M.D.   On: 07/19/2016 13:36   Ct Angio Chest Pe W And/or Wo Contrast  Result Date: 07/04/2016 CLINICAL DATA:  Positive D-dimer. History of hypertension and arrhythmias. EXAM: CT ANGIOGRAPHY CHEST WITH CONTRAST TECHNIQUE: Multidetector CT imaging of the chest was performed using the standard protocol during bolus administration of intravenous contrast. Multiplanar CT image reconstructions and MIPs were obtained to evaluate the vascular anatomy. CONTRAST:  100 mL Isovue 370 COMPARISON:  None. FINDINGS: Cardiovascular: Examination of the pulmonary arteries is somewhat limited by contrast bolus and motion artifact but there is no definite evidence of significant pulmonary embolus. Normal heart size. No pericardial effusion. Calcification of the aorta and coronary  arteries. Mediastinum/Nodes: No enlarged mediastinal, hilar, or axillary lymph nodes. Thyroid gland, trachea, and esophagus demonstrate no significant findings. Lungs/Pleura: Atelectasis in the lung bases. Emphysematous changes particularly in the upper lungs. Slight fibrosis. No focal consolidation. No pleural effusions. No pneumothorax. Upper Abdomen: The appearance of the liver suggests hepatic cirrhosis. The lateral segment of the left lobe is enlarged and there appears to be mild lobulation to the contour. The gallbladder is distended and filled with mildly hyperechoic material, likely representing sludge although small stones or milk of calcium could also have this appearance. No gallbladder wall thickening. No bile duct dilatation. Musculoskeletal: No chest wall abnormality. No acute or significant osseous findings. Review of the MIP images confirms the above findings. IMPRESSION: No evidence of significant pulmonary embolus. Emphysematous changes, fibrosis, and atelectasis in the lungs. Aortic atherosclerosis. Electronically Signed   By: Lucienne Capers M.D.   On: 07/04/2016 05:18   Mr Abdomen W Wo Contrast  Result Date: 07/21/2016 CLINICAL DATA:  Followup abnormal CT scan of the abdomen demonstrating a gallbladder mass and hepatic metastatic disease. EXAM: MRI ABDOMEN WITHOUT AND WITH CONTRAST TECHNIQUE: Multiplanar multisequence MR imaging of the abdomen was performed both before and after the administration of intravenous contrast. CONTRAST:  39mL MULTIHANCE GADOBENATE DIMEGLUMINE 529 MG/ML IV SOLN COMPARISON:  CT scan 07/19/2016 FINDINGS: Lower chest: The lung bases are grossly clear. No pleural or pericardial effusion. Hepatobiliary: Numerous hepatic lesions are again demonstrated. The largest lesion is in segment 5 and measures 49 mm. There are 3 lesions and segment 4B the largest 2 lesions measure 26 mm and a smaller lesion measures 12 mm. These are all diffusion positive consistent with  neoplasm. Enhancing tumor in the fundal region of the gallbladder is also diffusion positive and consistent with gallbladder cancer with hepatic metastatic disease. There are also enlarged periportal and celiac axis lymph nodes as demonstrated on the CT scan which are diffusion positive. Pancreas: No mass, inflammation or ductal dilatation. Peripancreatic adenopathy is again demonstrated. Spleen:  Normal size.  No focal lesions. Adrenals/Urinary Tract: The adrenal glands and kidneys are unremarkable. Stomach/Bowel: The stomach, duodenum, visualized small bowel and visualized colon are unremarkable. Vascular/Lymphatic: Advanced atherosclerotic calcifications involving the aorta and iliac arteries. No aneurysm. Enlarged metastatic periportal, celiac axis, peripancreatic and retroperitoneal lymph nodes. Other:  No ascites or abdominal wall hernia. Musculoskeletal: No significant bony findings IMPRESSION: 1. Enhancing and diffusion positive tumor in the gallbladder fundus consistent with gallbladder carcinoma. 2. Hepatic metastatic disease and upper abdominal metastatic lymphadenopathy. Electronically Signed   By: Marijo Sanes M.D.   On: 07/21/2016 15:02   Ct Abdomen Pelvis W Contrast  Result Date: 07/19/2016 CLINICAL DATA:  Abdominal pain EXAM: CT ABDOMEN AND PELVIS WITH CONTRAST TECHNIQUE: Multidetector CT imaging of the abdomen and pelvis was performed using the standard  protocol following bolus administration of intravenous contrast. Oral contrast was also administered. CONTRAST:  141mL ISOVUE-300 IOPAMIDOL (ISOVUE-300) INJECTION 61% COMPARISON:  CT abdomen and pelvis July 24, 2014 FINDINGS: Lower chest: There is bibasilar lung atelectasis. Heart is mildly enlarged. There is a focal area of decreased attenuation in the region of the left ventricle seen on axial slice 1 series 2 measuring 1.5 x 1.4 cm. There is a questionable pulmonary embolus in a right lower lobe pulmonary artery seen on axial slice 1  series 2. Hepatobiliary: There is an infiltrative appearing mass in the anterior segment of the right lobe of the liver measuring 7.7 x 4.9 cm. No other focal liver lesion is evident. There is extensive sludge in the gallbladder. An underlying mass in the gallbladder cannot be excluded. The gallbladder wall appears rather prominent by CT. There is no appreciable biliary duct dilatation. Pancreas: No pancreatic mass or inflammatory focus evident. Spleen: No splenic lesions are evident. Adrenals/Urinary Tract: Adrenals appear unremarkable bilaterally. There are two cysts arising from the upper pole of the left kidney eccentrically measuring just over 1 cm in size each. No non cystic liver lesions are evident. There is no hydronephrosis on either side. There is fetal lobulation on each side, an anatomic variant. There is no appreciable renal or ureteral calculus on either side. Urinary bladder is midline with wall thickness within normal limits. Stomach/Bowel: There are scattered colonic diverticula, primarily in the sigmoid region without diverticulitis. There is fairly diffuse stool throughout much of the colon. There is a right-sided femoral hernia which contains small bowel but no bowel compromise. There is no bowel dilatation or air-fluid level suggesting bowel obstruction. No free air or portal venous air is evident. Vascular/Lymphatic: There is extensive atherosclerotic calcification in the aorta and major pelvic arterial vessels diffusely. No aneurysms are evident. Major mesenteric arterial vessels appear patent with moderate atherosclerotic calcification noted, particularly at the origin of the left renal artery. There is periportal region adenopathy. The largest of the periportal lymph nodes measures 4.4 x 2.3 cm. There is no adenopathy distant to the periportal region. Reproductive: Uterus appears absent. No pelvic mass or pelvic fluid collection evident. Other: Appendix not appreciable. No periappendiceal  region inflammation. No abscess or ascites evident in the abdomen or pelvis. Musculoskeletal: There is extensive degenerative change in the lumbar spine. There is mild anterolisthesis of L4 on L5 and mild anterolisthesis of L5 on S1, findings present previously. There are no blastic or lytic bone lesions. There is no intramuscular or abdominal wall lesion. IMPRESSION: Large liver lesion felt to represent neoplastic focus. There is extensive periportal adenopathy. The gallbladder contains sludge with questionable mass within the gallbladder. Gallbladder wall appears borderline thickened. These findings raise concern for possible carcinoma arising from the gallbladder with liver metastasis and periportal adenopathy. Primary liver carcinoma is a differential consideration. Given the proclivity of colon carcinoma to metastasize to the liver, it may be prudent to consider direct visualization of the colon in this regard. Questionable pulmonary embolus in a right lower lobe pulmonary artery branch. This finding may warrant CT angiogram of the chest for further assessment. Questionable lesion in the left ventricle. This finding may warrant echocardiography to further assess. There is extensive aortic atherosclerosis as well as atherosclerosis throughout the major pelvic vessels. No aneurysm evident. Right-sided femoral hernia containing small bowel but no bowel compromise. Critical Value/emergent results were called by telephone at the time of interpretation on 07/19/2016 at 10:09 am to [place] II, PA ,  who verbally acknowledged these results. Electronically Signed   By: Lowella Grip III M.D.   On: 07/19/2016 10:10   Dg Chest Port 1 View  Result Date: 07/04/2016 CLINICAL DATA:  Chest pain tonight.  Bradycardia. EXAM: PORTABLE CHEST 1 VIEW COMPARISON:  06/17/2016 FINDINGS: Shallow inspiration. Linear atelectasis in the lung bases. No focal airspace disease or consolidation. No blunting of costophrenic  angles. No pneumothorax. Calcification of the aorta. Degenerative changes in the spine and shoulders. IMPRESSION: Linear atelectasis in the lung bases.  No focal consolidation. Electronically Signed   By: Lucienne Capers M.D.   On: 07/04/2016 02:04    Time Spent in minutes  25   Louellen Molder M.D on 07/22/2016 at 1:32 PM  Between 7am to 7pm - Pager - [phone]  After 7pm go to [email or web address] - password [facility]  [facility] -  Office  [phone]

## 2016-07-22 NOTE — Consult Note (Addendum)
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  HISTORY OF PRESENTING ILLNESS:   [name] is a wonderful 80 y.o. 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  Patient has a history of multiple medical comorbidities and a poor functional status related to her polymyalgia rheumatica, obesity chronic pain issues and multiple other medical comorbidities. She was following with me in clinic for monitoring and management of MGUS. PET/CT scan done on 07/14/2015 showed no focal hypermetabolic bony uptake to suggest multiple myeloma. At her rest of her myeloma workup was unrevealing. She was incidentally noted to have a small focus of hypermetabolism/soft tissue lesion or calcified stone in the gallbladder. She was recommended an MRI of the abdomen with and without contrast but was hesitant to do this due to "too many other medical problems". She subsequently had an ultrasound of the abdomen on 12/30/2015 that showed an abnormal complex appearing normal focus in the gallbladder corresponding to the PET/CT findings that - may reflect a gallbladder malignancy or tumefactive sludge or a large non mobile stone and probable hemangioma anteriorly likely in the left hepatic lobe. She was again given a referral for a  MRI of the liver with and without contrast and a general surgery referral for further evaluation of this gallbladder finding and to consider possible endoscopic cholecystectomy.  Patient again declined the MRI and declined general surgery evaluation and did not pursue this despite extensive counseling.  She has had several hospitalizations including for  urinary tract infection, discoloration of her goals concerning for arterial disease/thrombosis, cutaneous breakdown in her gluteal folds being managed by her primary care physician.  She eventually scheduled her Gen. surgery clinic appointment was due a couple of days after her recent hospital admission. She was admitted on 07/19/2016 with right upper quadrant abdominal discomfort that was getting worse for the last 3-4 days.  She had a CT of the abdomen which showed large liver lesion and extensive periportal adenopathy and sludge with questionable mass within the gallbladder. Also question of a pulmonary embolus in the right lower lobe.  CTA of the chest was then done which showed acute pulmonary thromboembolism in the right middle and lower lobes affecting segmental and subsegmental branches.  Patient has been started on IV heparin. She had an MRI of the abdomen with and without contrast on 07/21/2016 which showed 1. Enhancing and diffusion positive tumor in the gallbladder fundus consistent with gallbladder carcinoma. 2. Hepatic metastatic disease and upper abdominal metastatic Lymphadenopathy.  Patient has been scheduled for CT-guided biopsy by interventional radiology with the hospitalist managing her periprocedural anticoagulation. Currently her ECOG performance status is 3. We discussed the concern that this likely represents a metastatic gallbladder cancer and that the pathology will determine the diagnosis. Patient is clear that she would not want any form of surgery and introduced to maintain an minimalistic treatment stance. She is not sure if she would want chemotherapy or radiation but does want to know what her diagnosis is. We discussed that her overall performance status with multiple medical problems skin breakdown would likely make her a poor candidate for palliative chemotherapy if this were noted to be a metastatic malignancy but we shall determine that  based on her functional  status after she leaves the hospital.   MEDICAL HISTORY:  Past Medical History:  Diagnosis Date  . Chronic pain    Managed by Dr. Hardin Negus  . Diverticulitis   . Hyperlipidemia   . Hypertension   . IBS (irritable bowel syndrome)   . Palpitations   . Polymyalgia rheumatica ([place])   . PVC's (premature ventricular contractions)   . SVT (supraventricular tachycardia) ([place])   . Tobacco dependence     SURGICAL HISTORY: Past Surgical History:  Procedure Laterality Date  . ABDOMINAL HYSTERECTOMY Bilateral   . NASAL SINUS SURGERY    . REPLACEMENT TOTAL KNEE    . TONSILLECTOMY    . WRIST FRACTURE SURGERY      SOCIAL HISTORY: Social History   Social History  . Marital status: Married    Spouse name: N/A  . Number of children: 2  . Years of education: N/A   Occupational History  .  Retired   Social History Main Topics  . Smoking status: Former Smoker    Packs/day: 0.50    Quit date: 12/12/2011  . Smokeless tobacco: Never Used  . Alcohol use No  . Drug use: No  . Sexual activity: Not Currently    Birth control/ protection: None   Other Topics Concern  . Not on file   Social History Narrative  . No narrative on file    FAMILY HISTORY: Family History  Problem Relation Age of Onset  . Diabetes Mother   . Heart attack Father   . Heart disease Sister     ALLERGIES:  is allergic to amoxicillin-pot clavulanate; aspirin; atorvastatin; carisoprodol; codeine; codeine phosphate; cortisone; duoderm hydroactive; ezetimibe-simvastatin; naproxen sodium; omeprazole; quinapril hcl; rofecoxib; silvadene [silver sulfadiazine]; and xarelto [rivaroxaban].  MEDICATIONS:  Current Facility-Administered Medications  Medication Dose Route Frequency Provider Last Rate Last Dose  . 0.9 %  sodium chloride infusion  250 mL Intravenous PRN Velvet Bathe, MD 10 mL/hr at 07/21/16 1800 250 mL at 07/21/16 1800  . HYDROmorphone (DILAUDID) injection 1 mg  1 mg Intravenous Q3H PRN Nishant Dhungel,  MD      . levothyroxine (SYNTHROID, LEVOTHROID) tablet 88 mcg  88 mcg Oral QAC breakfast Velvet Bathe, MD   88 mcg at 07/23/16 0819  . meclizine (ANTIVERT) tablet 25 mg  25 mg Oral TID PRN Velvet Bathe, MD      . metoprolol succinate (TOPROL-XL) 24 hr tablet 150 mg  150 mg Oral Daily Velvet Bathe, MD   150 mg at 07/23/16 0819  . morphine (MS CONTIN) 12 hr tablet 15 mg  15 mg Oral Once Nishant Dhungel, MD      . morphine (MS CONTIN) 12 hr tablet 30 mg  30 mg Oral QHS Robbie Lis, MD   30 mg at 07/22/16 2108  . [START ON 07/24/2016] morphine (MS CONTIN) 12 hr tablet 30 mg  30 mg Oral Q breakfast Nishant Dhungel, MD      . oxyCODONE (Oxy IR/ROXICODONE) immediate release tablet 10 mg  10 mg Oral Q4H PRN Nishant Dhungel, MD   10 mg at 07/23/16 0840  . pantoprazole (PROTONIX) EC tablet 40 mg  40 mg Oral BID Willia Craze, NP   40 mg at 07/23/16 0819  . predniSONE (DELTASONE) tablet 1 mg  1 mg Oral Q breakfast Robbie Lis, MD   1 mg at 07/23/16 5329  . sertraline (ZOLOFT) tablet 50 mg  50 mg Oral Daily Velvet Bathe, MD   50  mg at 07/23/16 0819  . sodium chloride flush (NS) 0.9 % injection 3 mL  3 mL Intravenous Q12H Velvet Bathe, MD   3 mL at 07/23/16 0950  . sodium chloride flush (NS) 0.9 % injection 3 mL  3 mL Intravenous Q12H Velvet Bathe, MD   3 mL at 07/23/16 0950  . sodium chloride flush (NS) 0.9 % injection 3 mL  3 mL Intravenous PRN Velvet Bathe, MD        REVIEW OF SYSTEMS:    10 Point review of Systems was done is negative except as noted above.  PHYSICAL EXAMINATION: ECOG PERFORMANCE STATUS: 3 - Symptomatic, >50% confined to bed  . Vitals:   07/22/16 0552 07/22/16 0930  BP: 112/60 140/68  Pulse: 66 68  Resp: 16   Temp: 97.6 F (36.4 C)    Filed Weights   07/19/16 0616  Weight: 213 lb (96.6 kg)   .Body mass index is 33.36 kg/m.  GENERAL:alert, in no acute distress and comfortable SKIN: skin color, texture, turgor are normal, no rashes or significant lesions EYES:  normal, conjunctiva are pink and non-injected, sclera clear OROPHARYNX:no exudate, no erythema and lips, buccal mucosa, and tongue normal  NECK: supple, no JVD, thyroid normal size, non-tender, without nodularity LYMPH:  no palpable lymphadenopathy in the cervical, axillary or inguinal LUNGS: clear to auscultation with normal respiratory effort HEART: regular rate & rhythm,  no murmurs and no lower extremity edema ABDOMEN: abdomen soft, non-tender, normoactive bowel sounds  Musculoskeletal: no cyanosis of digits and no clubbing  PSYCH: alert & oriented x 3 with fluent speech NEURO: no focal motor/sensory deficits  LABORATORY DATA:  I have reviewed the data as listed  . CBC Latest Ref Rng & Units 07/23/2016 07/22/2016 07/21/2016  WBC 4.0 - 10.5 K/uL 6.9 6.1 7.4  Hemoglobin 12.0 - 15.0 g/dL 8.2(L) 7.9(L) 8.0(L)  Hematocrit 36.0 - 46.0 % 26.6(L) 25.3(L) 25.4(L)  Platelets 150 - 400 K/uL 314 273 286   . CBC    Component Value Date/Time   WBC 6.9 07/23/2016 0439   RBC 2.93 (L) 07/23/2016 0439   HGB 8.2 (L) 07/23/2016 0439   HGB 11.6 03/22/2016 1520   HCT 26.6 (L) 07/23/2016 0439   HCT 36.2 03/22/2016 1520   PLT 314 07/23/2016 0439   PLT 211 03/22/2016 1520   MCV 90.8 07/23/2016 0439   MCV 94.5 03/22/2016 1520   MCH 28.0 07/23/2016 0439   MCHC 30.8 07/23/2016 0439   RDW 18.0 (H) 07/23/2016 0439   RDW 16.1 (H) 03/22/2016 1520   LYMPHSABS 2.2 07/23/2016 0439   LYMPHSABS 3.3 03/22/2016 1520   MONOABS 0.6 07/23/2016 0439   MONOABS 0.6 03/22/2016 1520   EOSABS 0.1 07/23/2016 0439   EOSABS 0.1 03/22/2016 1520   BASOSABS 0.0 07/23/2016 0439   BASOSABS 0.0 03/22/2016 1520    . CMP Latest Ref Rng & Units 07/23/2016 07/21/2016 07/20/2016  Glucose 65 - 99 mg/dL 106(H) 98 109(H)  BUN 6 - 20 mg/dL _0 Creatinine 0.44 - 1.00 mg/dL 0.84 0.73 0.87  Sodium 135 - 145 mmol/L 136 135 133(L)  Potassium 3.5 - 5.1 mmol/L 3.8 3.8 3.9  Chloride 101 - 111 mmol/L 101 102 101  CO2 22 - 32  mmol/L _1 Calcium 8.9 - 10.3 mg/dL 8.8(L) 8.7(L) 8.9  Total Protein 6.5 - 8.1 g/dL 6.3(L) - -  Total Bilirubin 0.3 - 1.2 mg/dL 0.3 - -  Alkaline Phos 38 - 126 U/L 151(H) - -  AST 15 - 41 U/L 155(H) - -  ALT 14 - 54 U/L 89(H) - -   Component     Latest Ref Rng & Units 07/21/2016 07/22/2016  CEA     0.0 - 4.7 ng/mL 146.4 (H)   AFP Tumor Marker     0.0 - 8.3 ng/mL 4.0   CA 19-9     0 - 35 U/mL  41 (H)     RADIOGRAPHIC STUDIES: I have personally reviewed the radiological images as listed and agreed with the findings in the report. Ct Angio Chest Pe W And/or Wo Contrast  Result Date: 07/19/2016 CLINICAL DATA:  Abnormal CT abdomen and pelvis.  Abdominal pain. EXAM: CT ANGIOGRAPHY CHEST WITH CONTRAST TECHNIQUE: Multidetector CT imaging of the chest was performed using the standard protocol during bolus administration of intravenous contrast. Multiplanar CT image reconstructions and MIPs were obtained to evaluate the vascular anatomy. CONTRAST:  100 cc Isovue 370 COMPARISON:  07/04/2016 FINDINGS: Cardiovascular: There is low-density filling defect within the right pulmonary artery segmental branch of the lateral basal segment of the right lower lobe. It extends into subsegmental branches. This is compatible with pulmonary thromboembolism. There is also segmental and subsegmental pulmonary thromboembolism at the base of the right middle lobe. There are no central filling defects in the pulmonary arterial tree. No evidence of left pulmonary thromboembolism. Coronary artery calcifications. Mediastinum/Nodes: No abnormal mediastinal adenopathy. Right infrahilar lymphoid tissue there is stable and mildly prominent. Lungs/Pleura: Advanced apical centrilobular emphysema. There is a questionable nodular density in the superior segment of the left lower lobe on image 42 of series 14, measuring 1.0 cm. There is scattered volume loss in the lungs. Dependent atelectasis bilaterally. No pneumothorax. No  pleural effusion. Upper Abdomen: Simple cysts in the left kidney. Musculoskeletal: No vertebral compression deformity. Mild scoliosis of the thoracic spine. Review of the MIP images confirms the above findings. IMPRESSION: The study is positive for acute pulmonary thromboembolism in the right middle and lower lobes. This affects segmental and subsegmental branches. Possible 1.0 cm left lower lobe pulmonary nodule. Electronically Signed   By: Marybelle Killings M.D.   On: 07/19/2016 13:36   Ct Angio Chest Pe W And/or Wo Contrast  Result Date: 07/04/2016 CLINICAL DATA:  Positive D-dimer. History of hypertension and arrhythmias. EXAM: CT ANGIOGRAPHY CHEST WITH CONTRAST TECHNIQUE: Multidetector CT imaging of the chest was performed using the standard protocol during bolus administration of intravenous contrast. Multiplanar CT image reconstructions and MIPs were obtained to evaluate the vascular anatomy. CONTRAST:  100 mL Isovue 370 COMPARISON:  None. FINDINGS: Cardiovascular: Examination of the pulmonary arteries is somewhat limited by contrast bolus and motion artifact but there is no definite evidence of significant pulmonary embolus. Normal heart size. No pericardial effusion. Calcification of the aorta and coronary arteries. Mediastinum/Nodes: No enlarged mediastinal, hilar, or axillary lymph nodes. Thyroid gland, trachea, and esophagus demonstrate no significant findings. Lungs/Pleura: Atelectasis in the lung bases. Emphysematous changes particularly in the upper lungs. Slight fibrosis. No focal consolidation. No pleural effusions. No pneumothorax. Upper Abdomen: The appearance of the liver suggests hepatic cirrhosis. The lateral segment of the left lobe is enlarged and there appears to be mild lobulation to the contour. The gallbladder is distended and filled with mildly hyperechoic material, likely representing sludge although small stones or milk of calcium could also have this appearance. No gallbladder wall  thickening. No bile duct dilatation. Musculoskeletal: No chest wall abnormality. No acute or significant osseous findings. Review of the MIP images confirms  the above findings. IMPRESSION: No evidence of significant pulmonary embolus. Emphysematous changes, fibrosis, and atelectasis in the lungs. Aortic atherosclerosis. Electronically Signed   By: Lucienne Capers M.D.   On: 07/04/2016 05:18   Mr Abdomen W Wo Contrast  Result Date: 07/21/2016 CLINICAL DATA:  Followup abnormal CT scan of the abdomen demonstrating a gallbladder mass and hepatic metastatic disease. EXAM: MRI ABDOMEN WITHOUT AND WITH CONTRAST TECHNIQUE: Multiplanar multisequence MR imaging of the abdomen was performed both before and after the administration of intravenous contrast. CONTRAST:  86m MULTIHANCE GADOBENATE DIMEGLUMINE 529 MG/ML IV SOLN COMPARISON:  CT scan 07/19/2016 FINDINGS: Lower chest: The lung bases are grossly clear. No pleural or pericardial effusion. Hepatobiliary: Numerous hepatic lesions are again demonstrated. The largest lesion is in segment 5 and measures 49 mm. There are 3 lesions and segment 4B the largest 2 lesions measure 26 mm and a smaller lesion measures 12 mm. These are all diffusion positive consistent with neoplasm. Enhancing tumor in the fundal region of the gallbladder is also diffusion positive and consistent with gallbladder cancer with hepatic metastatic disease. There are also enlarged periportal and celiac axis lymph nodes as demonstrated on the CT scan which are diffusion positive. Pancreas: No mass, inflammation or ductal dilatation. Peripancreatic adenopathy is again demonstrated. Spleen:  Normal size.  No focal lesions. Adrenals/Urinary Tract: The adrenal glands and kidneys are unremarkable. Stomach/Bowel: The stomach, duodenum, visualized small bowel and visualized colon are unremarkable. Vascular/Lymphatic: Advanced atherosclerotic calcifications involving the aorta and iliac arteries. No aneurysm.  Enlarged metastatic periportal, celiac axis, peripancreatic and retroperitoneal lymph nodes. Other:  No ascites or abdominal wall hernia. Musculoskeletal: No significant bony findings IMPRESSION: 1. Enhancing and diffusion positive tumor in the gallbladder fundus consistent with gallbladder carcinoma. 2. Hepatic metastatic disease and upper abdominal metastatic lymphadenopathy. Electronically Signed   By: PMarijo SanesM.D.   On: 07/21/2016 15:02   Ct Abdomen Pelvis W Contrast  Result Date: 07/19/2016 CLINICAL DATA:  Abdominal pain EXAM: CT ABDOMEN AND PELVIS WITH CONTRAST TECHNIQUE: Multidetector CT imaging of the abdomen and pelvis was performed using the standard protocol following bolus administration of intravenous contrast. Oral contrast was also administered. CONTRAST:  1023mISOVUE-300 IOPAMIDOL (ISOVUE-300) INJECTION 61% COMPARISON:  CT abdomen and pelvis July 24, 2014 FINDINGS: Lower chest: There is bibasilar lung atelectasis. Heart is mildly enlarged. There is a focal area of decreased attenuation in the region of the left ventricle seen on axial slice 1 series 2 measuring 1.5 x 1.4 cm. There is a questionable pulmonary embolus in a right lower lobe pulmonary artery seen on axial slice 1 series 2. Hepatobiliary: There is an infiltrative appearing mass in the anterior segment of the right lobe of the liver measuring 7.7 x 4.9 cm. No other focal liver lesion is evident. There is extensive sludge in the gallbladder. An underlying mass in the gallbladder cannot be excluded. The gallbladder wall appears rather prominent by CT. There is no appreciable biliary duct dilatation. Pancreas: No pancreatic mass or inflammatory focus evident. Spleen: No splenic lesions are evident. Adrenals/Urinary Tract: Adrenals appear unremarkable bilaterally. There are two cysts arising from the upper pole of the left kidney eccentrically measuring just over 1 cm in size each. No non cystic liver lesions are evident. There  is no hydronephrosis on either side. There is fetal lobulation on each side, an anatomic variant. There is no appreciable renal or ureteral calculus on either side. Urinary bladder is midline with wall thickness within normal limits. Stomach/Bowel: There are scattered  colonic diverticula, primarily in the sigmoid region without diverticulitis. There is fairly diffuse stool throughout much of the colon. There is a right-sided femoral hernia which contains small bowel but no bowel compromise. There is no bowel dilatation or air-fluid level suggesting bowel obstruction. No free air or portal venous air is evident. Vascular/Lymphatic: There is extensive atherosclerotic calcification in the aorta and major pelvic arterial vessels diffusely. No aneurysms are evident. Major mesenteric arterial vessels appear patent with moderate atherosclerotic calcification noted, particularly at the origin of the left renal artery. There is periportal region adenopathy. The largest of the periportal lymph nodes measures 4.4 x 2.3 cm. There is no adenopathy distant to the periportal region. Reproductive: Uterus appears absent. No pelvic mass or pelvic fluid collection evident. Other: Appendix not appreciable. No periappendiceal region inflammation. No abscess or ascites evident in the abdomen or pelvis. Musculoskeletal: There is extensive degenerative change in the lumbar spine. There is mild anterolisthesis of L4 on L5 and mild anterolisthesis of L5 on S1, findings present previously. There are no blastic or lytic bone lesions. There is no intramuscular or abdominal wall lesion. IMPRESSION: Large liver lesion felt to represent neoplastic focus. There is extensive periportal adenopathy. The gallbladder contains sludge with questionable mass within the gallbladder. Gallbladder wall appears borderline thickened. These findings raise concern for possible carcinoma arising from the gallbladder with liver metastasis and periportal adenopathy.  Primary liver carcinoma is a differential consideration. Given the proclivity of colon carcinoma to metastasize to the liver, it may be prudent to consider direct visualization of the colon in this regard. Questionable pulmonary embolus in a right lower lobe pulmonary artery branch. This finding may warrant CT angiogram of the chest for further assessment. Questionable lesion in the left ventricle. This finding may warrant echocardiography to further assess. There is extensive aortic atherosclerosis as well as atherosclerosis throughout the major pelvic vessels. No aneurysm evident. Right-sided femoral hernia containing small bowel but no bowel compromise. Critical Value/emergent results were called by telephone at the time of interpretation on 07/19/2016 at 10:09 am to [place], PA , who verbally acknowledged these results. Electronically Signed   By: Lowella Grip III M.D.   On: 07/19/2016 10:10   Dg Chest Port 1 View  Result Date: 07/04/2016 CLINICAL DATA:  Chest pain tonight.  Bradycardia. EXAM: PORTABLE CHEST 1 VIEW COMPARISON:  06/17/2016 FINDINGS: Shallow inspiration. Linear atelectasis in the lung bases. No focal airspace disease or consolidation. No blunting of costophrenic angles. No pneumothorax. Calcification of the aorta. Degenerative changes in the spine and shoulders. IMPRESSION: Linear atelectasis in the lung bases.  No focal consolidation. Electronically Signed   By: Lucienne Capers M.D.   On: 07/04/2016 02:04    ASSESSMENT & PLAN:   80 year old Caucasian female with multiple medical comorbidities and ECOG performance status of 3 with  1) Concern for metastatic malignancy with multiple liver metastases Given the gallbladder findings periportal adenopathy and multiple liver metastases this appears to be a metastatic gallbladder cancer based on radiology. The differential would be colon cancer metastatic to the liver. AFP tumor marker within normal limits makes this less  likely hepatocellular carcinoma CEA level is elevated and 146.6 CA-19-9 level is borderline elevated at 41. 2) newly diagnosed acute pulmonary embolism in her right middle and right lower lobe. Currently on IV heparin. 3) poor functional status ECOG performance status of 3 4) polymyalgia rheumatica 5) gluteal skin breakdown prior to admission. 6) significant anxiety issues. 7)normocytic normochromic anemia - likely due  to malignancy PLAN -I discussed imaging findings in details with the patient. -She continues to be on IV heparin at this time for her acute PE. Would ideally require Lovenox on discharge. However in goals of care are palliative/supportive cares may consider NOAC's -We discussed that this clinically and radiographically appears to be metastatic malignancy and that treatment goals would likely be palliative if that's confirmed. -She is keen to know what the diagnosis with the biopsy but notes that she would not want surgery. -She is unsure about whether she would want any other treatments. She notes that pain is what's cares are the most and that she would want to be kept comfortable. -IV heparin management peri-procedurally as per hospitalist. -Patient has been scheduled for a CT-guided biopsy of her liver lesion by interventional radiology on 07/23/2016. -Transfuse PRBC prn for hemoglobin less than 8 or if symptomatic -Other questions from the patient and her nephew in the room were answered in details. -Appreciate excellent cares by the [facility] medicine team.  I shall follow up again on Monday/tuesday when pathology results will likely be available.   All of the patients questions were answered with apparent satisfaction. The patient knows to call the clinic with any problems, questions or concerns.  I spent 45 minutes counseling the patient face to face. The total time spent in the appointment was 60 minutes and more than 50% was on counseling and direct patient cares.      Sullivan Lone MD [place] AAHIVMS [facility] Hematology/Oncology Physician [facility]  (Office):       [phone] (Work cell):  [phone] (Fax):           [phone]  07/22/2016 5:12 PM

## 2016-07-22 NOTE — Progress Notes (Signed)
PT Cancellation Note  Patient Details Name: [name] MRN: [id number] DOB: 01-Feb-1936   Cancelled Treatment:    Reason Eval/Treat Not Completed: Patient declined, no reason specified Pt declined due to just waking up and RN into room with meds.  Pt would like PT to check back later.   Harun Brumley,KATHrine E 07/22/2016, 11:28 AM Carmelia Bake, PT, DPT 07/22/2016 Pager: [phone]

## 2016-07-23 ENCOUNTER — Inpatient Hospital Stay (HOSPITAL_COMMUNITY): Payer: 59

## 2016-07-23 DIAGNOSIS — R740 Nonspecific elevation of levels of transaminase and lactic acid dehydrogenase [LDH]: Secondary | ICD-10-CM

## 2016-07-23 LAB — CBC WITH DIFFERENTIAL/PLATELET
BASOS ABS: 0 10*3/uL (ref 0.0–0.1)
Basophils Relative: 0 %
Eosinophils Absolute: 0.1 10*3/uL (ref 0.0–0.7)
Eosinophils Relative: 2 %
HEMATOCRIT: 26.6 % — AB (ref 36.0–46.0)
Hemoglobin: 8.2 g/dL — ABNORMAL LOW (ref 12.0–15.0)
LYMPHS PCT: 33 %
Lymphs Abs: 2.2 10*3/uL (ref 0.7–4.0)
MCH: 28 pg (ref 26.0–34.0)
MCHC: 30.8 g/dL (ref 30.0–36.0)
MCV: 90.8 fL (ref 78.0–100.0)
MONO ABS: 0.6 10*3/uL (ref 0.1–1.0)
Monocytes Relative: 9 %
NEUTROS ABS: 3.9 10*3/uL (ref 1.7–7.7)
Neutrophils Relative %: 56 %
Platelets: 314 10*3/uL (ref 150–400)
RBC: 2.93 MIL/uL — AB (ref 3.87–5.11)
RDW: 18 % — ABNORMAL HIGH (ref 11.5–15.5)
WBC: 6.9 10*3/uL (ref 4.0–10.5)

## 2016-07-23 LAB — COMPREHENSIVE METABOLIC PANEL
ALT: 89 U/L — AB (ref 14–54)
AST: 155 U/L — AB (ref 15–41)
Albumin: 2.8 g/dL — ABNORMAL LOW (ref 3.5–5.0)
Alkaline Phosphatase: 151 U/L — ABNORMAL HIGH (ref 38–126)
Anion gap: 8 (ref 5–15)
BUN: 15 mg/dL (ref 6–20)
CO2: 27 mmol/L (ref 22–32)
Calcium: 8.8 mg/dL — ABNORMAL LOW (ref 8.9–10.3)
Chloride: 101 mmol/L (ref 101–111)
Creatinine, Ser: 0.84 mg/dL (ref 0.44–1.00)
GFR calc Af Amer: >60 mL/min (ref >60)
Glucose, Bld: 106 mg/dL — ABNORMAL HIGH (ref 65–99)
Potassium: 3.8 mmol/L (ref 3.5–5.1)
Sodium: 136 mmol/L (ref 135–145)
TOTAL PROTEIN: 6.3 g/dL — AB (ref 6.5–8.1)
Total Bilirubin: 0.3 mg/dL (ref 0.3–1.2)

## 2016-07-23 LAB — PROTIME-INR
INR: 1.01
Prothrombin Time: 13.3 seconds (ref 11.4–15.2)

## 2016-07-23 LAB — HEPARIN LEVEL (UNFRACTIONATED): Heparin Unfractionated: 0.27 IU/mL — ABNORMAL LOW (ref 0.30–0.70)

## 2016-07-23 LAB — CANCER ANTIGEN 19-9: CA 19-9: 41 U/mL — ABNORMAL HIGH (ref 0–35)

## 2016-07-23 MED ORDER — HYDROMORPHONE HCL 1 MG/ML IJ SOLN
1.0000 mg | INTRAMUSCULAR | Status: DC | PRN
  Administered 2016-07-23 – 2016-07-28 (×10): 1 mg via INTRAVENOUS
  Filled 2016-07-23 (×10): qty 1

## 2016-07-23 MED ORDER — MORPHINE SULFATE ER 15 MG PO TBCR
15.0000 mg | EXTENDED_RELEASE_TABLET | Freq: Once | ORAL | Status: AC
  Administered 2016-07-23: 15 mg via ORAL
  Filled 2016-07-23: qty 1

## 2016-07-23 MED ORDER — FLUMAZENIL 0.5 MG/5ML IV SOLN
INTRAVENOUS | Status: AC
  Filled 2016-07-23: qty 5

## 2016-07-23 MED ORDER — MIDAZOLAM HCL 2 MG/2ML IJ SOLN
INTRAMUSCULAR | Status: AC | PRN
  Administered 2016-07-23: 0.5 mg via INTRAVENOUS
  Administered 2016-07-23: 1 mg via INTRAVENOUS

## 2016-07-23 MED ORDER — MIDAZOLAM HCL 2 MG/2ML IJ SOLN
INTRAMUSCULAR | Status: AC
  Filled 2016-07-23: qty 4

## 2016-07-23 MED ORDER — MORPHINE SULFATE ER 30 MG PO TBCR
30.0000 mg | EXTENDED_RELEASE_TABLET | Freq: Every day | ORAL | Status: DC
  Administered 2016-07-24: 30 mg via ORAL
  Filled 2016-07-23: qty 1

## 2016-07-23 MED ORDER — NALOXONE HCL 0.4 MG/ML IJ SOLN
INTRAMUSCULAR | Status: AC
  Filled 2016-07-23: qty 1

## 2016-07-23 MED ORDER — FENTANYL CITRATE (PF) 100 MCG/2ML IJ SOLN
INTRAMUSCULAR | Status: AC | PRN
  Administered 2016-07-23: 25.5 ug via INTRAVENOUS

## 2016-07-23 MED ORDER — HEPARIN (PORCINE) IN NACL 100-0.45 UNIT/ML-% IJ SOLN: 1000.0000 [IU]/h | INTRAMUSCULAR | Status: DC

## 2016-07-23 MED ORDER — HEPARIN (PORCINE) IN NACL 100-0.45 UNIT/ML-% IJ SOLN
1000.0000 [IU]/h | INTRAMUSCULAR | Status: DC
  Administered 2016-07-23: 1000 [IU]/h via INTRAVENOUS

## 2016-07-23 MED ORDER — FENTANYL CITRATE (PF) 100 MCG/2ML IJ SOLN
INTRAMUSCULAR | Status: AC
  Filled 2016-07-23: qty 4

## 2016-07-23 MED ORDER — HEPARIN (PORCINE) IN NACL 100-0.45 UNIT/ML-% IJ SOLN
1000.0000 [IU]/h | INTRAMUSCULAR | Status: AC
  Administered 2016-07-23 – 2016-07-24 (×2): 1000 [IU]/h via INTRAVENOUS
  Filled 2016-07-23: qty 250

## 2016-07-23 NOTE — Progress Notes (Signed)
Heparin drip stopped at 0825 per MD orders for biopsy in IR at noon.

## 2016-07-23 NOTE — Sedation Documentation (Signed)
Patient is resting comfortably. 

## 2016-07-23 NOTE — Progress Notes (Signed)
After patient arrived back to unit from biopsy vitals and biopsy site were assessed Q15 min X 4 and Q30 min X2.  Pt vitals remained stable and biopsy site remained clean dry and intact.

## 2016-07-23 NOTE — Progress Notes (Signed)
Pt have a zero co-pay for Lovenox/Insurance check.  Pt selected [facility], referral given to in house rep.

## 2016-07-23 NOTE — Progress Notes (Signed)
PT Cancellation Note  Patient Details Name: [name] MRN: [id number] DOB: 1936-03-24   Cancelled Treatment:    Reason Eval/Treat Not Completed: Pain limiting ability to participate;Pt on bedrest after biopsy. Briefly spoke with pt c/o significant pain. Will check back another day.    Weston Anna, MPT Pager: [phone]

## 2016-07-23 NOTE — Procedures (Signed)
Hepatic mass  S/p [place] RT LIVER MASS BX  NO COMP STABLE PATH PENDING FULL REPORT IN PACS

## 2016-07-23 NOTE — Progress Notes (Signed)
ANTICOAGULATION CONSULT NOTE - Follow Up Consult  Pharmacy Consult for Heparin Indication: pulmonary embolus  Allergies  Allergen Reactions  . Amoxicillin-Pot Clavulanate Diarrhea    Has patient had a PCN reaction causing immediate rash, facial/tongue/throat swelling, SOB or lightheadedness with hypotension: No Has patient had a PCN reaction causing severe rash involving mucus membranes or skin necrosis: No Has patient had a PCN reaction that required hospitalization No Has patient had a PCN reaction occurring within the last 10 years: Yes If all of the above answers are "NO", then may proceed with Cephalosporin use.   . Aspirin Nausea And Vomiting  . Atorvastatin     REACTION: feel weak  . Carisoprodol     REACTION: intolerant  . Codeine     REACTION: hallucinations  . Codeine Phosphate     REACTION: UNKNOWN  . Cortisone     REACTION: pt. reports allergy, reaction not known  . Duoderm Hydroactive     Skin irritation and pain     . Ezetimibe-Simvastatin     REACTION: feels weak  . Naproxen Sodium     REACTION: GI symptoms  . Omeprazole     REACTION: abd pain  . Quinapril Hcl     REACTION: reports allergy, reaction not known  . Rofecoxib     REACTION: reports allergy, reaction not known  . Silvadene [Silver Sulfadiazine]     Skin irritation and pain   . Xarelto [Rivaroxaban] Other (See Comments)    Pain     Patient Measurements: Height: 5\' 7"  (170.2 cm) Weight: 213 lb (96.6 kg) IBW/kg (Calculated) : 61.6 Heparin Dosing Weight: 83kg  Vital Signs: Temp: 98.1 F (36.7 C) (12/01 1321) Temp Source: Oral (12/01 1321) BP: 113/63 (12/01 1321) Pulse Rate: 60 (12/01 1321)  Labs:  Recent Labs  07/20/16 2106 07/21/16 0530 07/22/16 0521 07/23/16 0439  HGB  --  8.0* 7.9* 8.2*  HCT  --  25.4* 25.3* 26.6*  PLT  --  286 273 314  APTT 68*  --   --   --   LABPROT  --   --   --  13.3  INR  --   --   --  1.01  HEPARINUNFRC 0.58 0.42 0.41 0.27*  CREATININE  --  0.73   --  0.84    Estimated Creatinine Clearance: 65.9 mL/min (by C-G formula based on SCr of 0.84 mg/dL).   Medications:  Infusions:    Assessment: 32 yoF with PMHx polymyalgia rheumatica, chronic pain, IBS, diverticulitis, afib with SVT and PVCs, HTN/HLD, anemia, COPD and obesity presents with right sided abdominal pain.  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  Pharmacy consulted to start IV heparin.  Discussed case with PA as pt has positive FOB and hemoglobin has dropped 10.2 >> 8.5 in the last two weeks.  Will begin very conservatively with heparin and omit bolus.    Patient was on xarelto but stopped d/t GI intolerance, Cardiology started her on Eliquis but unable to afford. Patient states she has not been on anticoagulation for ~ 1 week.   Today, 07/23/2016:  Heparin level subtherapeutic on 900 units/hr this am so rate increase but heparin turned off at about 8am for IR to perform liver biopsy  CBC: Hgb low/stable, pltc WNL  FOBT was positive but per GI no acute or subacute bleed likely; GI signed off as no interventions warranted other than blood/meds   No obvious bleeding problems  Goal of  Therapy:  Heparin level 0.3-0.7 units/ml Monitor platelets by anticoagulation protocol: Yes   Plan:   Patient with liver bx at 12:30, per radiologist consult this is  a "standard" bleeding risk, start heparin at 1000 units/hr 6h after procedure (ie. 18:30)  Check HL 8 hours after heparin resumed - likely check at 4am to prevent two sticks in am  Daily CBC and heparin level  Await transition to LMWH (with solid tumor) vs . Warfarin since not able to afford DOAC  Doreene Eland, PharmD, BCPS.   Pager: [phone] 07/23/2016 1:36 PM

## 2016-07-23 NOTE — Progress Notes (Signed)
PROGRESS NOTE                                                                                                                                                                                                             Patient Demographics:    [name], is a 80 y.o. female, DOB - 01/12/1936, [id number]  Admit date - 07/19/2016   Admitting Physician Velvet Bathe, MD  Outpatient Primary MD for the patient is [name], MD  LOS - 4  Outpatient Specialists: Dr Irene Limbo  Chief Complaint  Patient presents with  . Abdominal Pain       Brief Narrative   80 year old female with history of A. fib on anticoagulation, polymyalgia rheumatica on low-dose prednisone, IBS, COPD, MGUS (follows with Dr. Irene Limbo) presented with abdominal discomfort for last few days. She has also had almost 20 pound weight loss in the past 3 months. In the ED CT of the abdomen and pelvis showed a large liver lesion with extensive periportal adenopathy. Also showed gallbladder with sludge with questionable mass within the gallbladder and thickened wall. A CT angiogram of the chest done was positive for acute PE in the right middle and lower lobes. (Also showed a 1 cm left lower lobe pulmonary nodule). Patient admitted to hospitalist service and GI consulted.   Subjective:   Simona Huh to have right upper quadrant pain.   Assessment  & Plan :    Principal Problem:   Pulmonary embolism without acute cor pulmonale (HCC) On IV heparin. We'll start Coumadin after liver biopsy today. Patient was on Xarelto at home but due to cost issues has not been able to take them.  Hemoccult-positive. H&H is currently stable.  -appreciated GI eval. No intervention needed.   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 -Scheduled for CT-guided liver biopsy today. -AFP negative and CEA markedly elevated (146). Appreciate oncology consult. Recommends if this is a gallbladder malignancy by pathology and would be palliative. - Continues to have pain despite increasing oxycodone and morphine dose. I have increased her MS Contin dose and switched IV morphine to low-dose IV Dilaudid. If pain persists on this regimen will ask palliative care for assistance.  Anemia of chronic disease Monitor hemoglobin closely while on heparin drip. Hemoccult positive. No need for colonoscopy at present per GI.  Hypothyroidism Continue Synthroid.  Chronic A. fib Rate controlled with metoprolol. Currently on IV heparin. start Coumadin after liver biopsy. Patient will be discharged home with Lovenox bridge.     PMR (polymyalgia rheumatica) (HCC) Continue low-dose prednisone and home MS Contin.  Transaminitis LFTs trending up possibly due to liver metastases. Monitor closely.   Code Status : Full code  Family Communication  : none at bedside today  Disposition Plan  : Home once abdominal pain better controlled, possibly early next week.  Barriers For Discharge : Active symptoms  Consults  :  [facility] (Dr. Irene Limbo)  Procedures  :  CT abdomen, chest with PE protocol MRI abdomen Liver biopsy  DVT Prophylaxis  : IV heparin  Lab Results  Component Value Date   PLT 314 07/23/2016    Antibiotics  :    Anti-infectives    None        Objective:   Vitals:   07/22/16 0930 07/22/16 1700 07/22/16 2131 07/23/16 0627  BP: 140/68 (!) 100/59 111/62 (!) 105/53  Pulse: 68 70 70 67  Resp:  20 18 18   Temp:  97.9 F (36.6 C) 98.6 F (37 C) 98.8 F (37.1 C)  TempSrc:  Oral Oral Oral  SpO2: 93% 92% 97% 92%  Weight:      Height:        Wt Readings from Last 3 Encounters:  07/19/16 96.6 kg (213 lb)    07/12/16 97.5 kg (215 lb)  07/04/16 98 kg (216 lb)     Intake/Output Summary (Last 24 hours) at 07/23/16 1207 Last data filed at 07/23/16 0600  Gross per 24 hour  Intake              464 ml  Output                0 ml  Net              464 ml     Physical Exam  Gen: Appears fatigued HEENT: moist mucosa, supple neck Chest: clear b/l, no added sounds CVS: N S1&S2, no murmurs,  GI: soft, nondistended, bowel sounds present, right upper quadrant tenderness Musculoskeletal: warm, no edema     Data Review:    CBC  Recent Labs Lab 07/19/16 0705  07/20/16 1055 07/20/16 1718 07/21/16 0530 07/22/16 0521 07/23/16 0439  WBC 7.1  < > 7.8 8.3 7.4 6.1 6.9  HGB 8.5*  < > 8.4* 8.1* 8.0* 7.9* 8.2*  HCT 26.5*  < > 26.4* 25.7* 25.4* 25.3* 26.6*  PLT 261  < > 293 298 286 273 314  MCV 87.2  < > 86.8 86.8 89.4 90.4 90.8  MCH 28.0  < > 27.6 27.4 28.2 28.2 28.0  MCHC 32.1  < > 31.8 31.5 31.5 31.2 30.8  RDW 17.7*  < > 18.0* 17.9* 18.2* 18.1* 18.0*  LYMPHSABS 1.3  --   --   --   --   --  2.2  MONOABS 0.6  --   --   --   --   --  0.6  EOSABS 0.1  --   --   --   --   --  0.1  BASOSABS 0.0  --   --   --   --   --  0.0  < > = values in this interval not  displayed.  Chemistries   Recent Labs Lab 07/19/16 0705 07/20/16 0058 07/21/16 0530 07/23/16 0439  NA 135 133* 135 136  K 4.0 3.9 3.8 3.8  CL 102 101 102 101  CO2 25 28 27 27   GLUCOSE 124* 109* 98 106*  BUN 16 12 11 15   CREATININE 0.77 0.87 0.73 0.84  CALCIUM 9.6 8.9 8.7* 8.8*  AST 21  --   --  155*  ALT 13*  --   --  89*  ALKPHOS 98  --   --  151*  BILITOT 0.7  --   --  0.3   ------------------------------------------------------------------------------------------------------------------ No results for input(s): CHOL, HDL, LDLCALC, TRIG, CHOLHDL, LDLDIRECT in the last 72 hours.  Lab Results  Component Value Date   HGBA1C 5.7 (H) 06/17/2016    ------------------------------------------------------------------------------------------------------------------ No results for input(s): TSH, T4TOTAL, T3FREE, THYROIDAB in the last 72 hours.  Invalid input(s): FREET3 ------------------------------------------------------------------------------------------------------------------ No results for input(s): VITAMINB12, FOLATE, FERRITIN, TIBC, IRON, RETICCTPCT in the last 72 hours.  Coagulation profile  Recent Labs Lab 07/19/16 1516 07/23/16 0439  INR 1.05 1.01    No results for input(s): DDIMER in the last 72 hours.  Cardiac Enzymes No results for input(s): CKMB, TROPONINI, MYOGLOBIN in the last 168 hours.  Invalid input(s): CK ------------------------------------------------------------------------------------------------------------------    Component Value Date/Time   BNP 197.8 (H) 07/04/2016 0056   BNP 130.2 (H) 09/22/2015 [id number]    Inpatient Medications  Scheduled Meds: . fentaNYL      . flumazenil      . levothyroxine  88 mcg Oral QAC breakfast  . metoprolol succinate  150 mg Oral Daily  . midazolam      . morphine  30 mg Oral QHS  . [START ON 07/24/2016] morphine  30 mg Oral Q breakfast  . naloxone      . pantoprazole  40 mg Oral BID  . predniSONE  1 mg Oral Q breakfast  . sertraline  50 mg Oral Daily  . sodium chloride flush  3 mL Intravenous Q12H  . sodium chloride flush  3 mL Intravenous Q12H   Continuous Infusions:  PRN Meds:.sodium chloride, HYDROmorphone (DILAUDID) injection, meclizine, oxyCODONE, sodium chloride flush  Micro Results No results found for this or any previous visit (from the past 240 hour(s)).  Radiology Reports Ct Angio Chest Pe W And/or Wo Contrast  Result Date: 07/19/2016 CLINICAL DATA:  Abnormal CT abdomen and pelvis.  Abdominal pain. EXAM: CT ANGIOGRAPHY CHEST WITH CONTRAST TECHNIQUE: Multidetector CT imaging of the chest was performed using the standard protocol during bolus  administration of intravenous contrast. Multiplanar CT image reconstructions and MIPs were obtained to evaluate the vascular anatomy. CONTRAST:  100 cc Isovue 370 COMPARISON:  07/04/2016 FINDINGS: Cardiovascular: There is low-density filling defect within the right pulmonary artery segmental branch of the lateral basal segment of the right lower lobe. It extends into subsegmental branches. This is compatible with pulmonary thromboembolism. There is also segmental and subsegmental pulmonary thromboembolism at the base of the right middle lobe. There are no central filling defects in the pulmonary arterial tree. No evidence of left pulmonary thromboembolism. Coronary artery calcifications. Mediastinum/Nodes: No abnormal mediastinal adenopathy. Right infrahilar lymphoid tissue there is stable and mildly prominent. Lungs/Pleura: Advanced apical centrilobular emphysema. There is a questionable nodular density in the superior segment of the left lower lobe on image 42 of series 14, measuring 1.0 cm. There is scattered volume loss in the lungs. Dependent atelectasis bilaterally. No pneumothorax. No pleural effusion. Upper Abdomen: Simple cysts  in the left kidney. Musculoskeletal: No vertebral compression deformity. Mild scoliosis of the thoracic spine. Review of the MIP images confirms the above findings. IMPRESSION: The study is positive for acute pulmonary thromboembolism in the right middle and lower lobes. This affects segmental and subsegmental branches. Possible 1.0 cm left lower lobe pulmonary nodule. Electronically Signed   By: Marybelle Killings M.D.   On: 07/19/2016 13:36   Ct Angio Chest Pe W And/or Wo Contrast  Result Date: 07/04/2016 CLINICAL DATA:  Positive D-dimer. History of hypertension and arrhythmias. EXAM: CT ANGIOGRAPHY CHEST WITH CONTRAST TECHNIQUE: Multidetector CT imaging of the chest was performed using the standard protocol during bolus administration of intravenous contrast. Multiplanar CT image  reconstructions and MIPs were obtained to evaluate the vascular anatomy. CONTRAST:  100 mL Isovue 370 COMPARISON:  None. FINDINGS: Cardiovascular: Examination of the pulmonary arteries is somewhat limited by contrast bolus and motion artifact but there is no definite evidence of significant pulmonary embolus. Normal heart size. No pericardial effusion. Calcification of the aorta and coronary arteries. Mediastinum/Nodes: No enlarged mediastinal, hilar, or axillary lymph nodes. Thyroid gland, trachea, and esophagus demonstrate no significant findings. Lungs/Pleura: Atelectasis in the lung bases. Emphysematous changes particularly in the upper lungs. Slight fibrosis. No focal consolidation. No pleural effusions. No pneumothorax. Upper Abdomen: The appearance of the liver suggests hepatic cirrhosis. The lateral segment of the left lobe is enlarged and there appears to be mild lobulation to the contour. The gallbladder is distended and filled with mildly hyperechoic material, likely representing sludge although small stones or milk of calcium could also have this appearance. No gallbladder wall thickening. No bile duct dilatation. Musculoskeletal: No chest wall abnormality. No acute or significant osseous findings. Review of the MIP images confirms the above findings. IMPRESSION: No evidence of significant pulmonary embolus. Emphysematous changes, fibrosis, and atelectasis in the lungs. Aortic atherosclerosis. Electronically Signed   By: Lucienne Capers M.D.   On: 07/04/2016 05:18   Mr Abdomen W Wo Contrast  Result Date: 07/21/2016 CLINICAL DATA:  Followup abnormal CT scan of the abdomen demonstrating a gallbladder mass and hepatic metastatic disease. EXAM: MRI ABDOMEN WITHOUT AND WITH CONTRAST TECHNIQUE: Multiplanar multisequence MR imaging of the abdomen was performed both before and after the administration of intravenous contrast. CONTRAST:  60mL MULTIHANCE GADOBENATE DIMEGLUMINE 529 MG/ML IV SOLN COMPARISON:   CT scan 07/19/2016 FINDINGS: Lower chest: The lung bases are grossly clear. No pleural or pericardial effusion. Hepatobiliary: Numerous hepatic lesions are again demonstrated. The largest lesion is in segment 5 and measures 49 mm. There are 3 lesions and segment 4B the largest 2 lesions measure 26 mm and a smaller lesion measures 12 mm. These are all diffusion positive consistent with neoplasm. Enhancing tumor in the fundal region of the gallbladder is also diffusion positive and consistent with gallbladder cancer with hepatic metastatic disease. There are also enlarged periportal and celiac axis lymph nodes as demonstrated on the CT scan which are diffusion positive. Pancreas: No mass, inflammation or ductal dilatation. Peripancreatic adenopathy is again demonstrated. Spleen:  Normal size.  No focal lesions. Adrenals/Urinary Tract: The adrenal glands and kidneys are unremarkable. Stomach/Bowel: The stomach, duodenum, visualized small bowel and visualized colon are unremarkable. Vascular/Lymphatic: Advanced atherosclerotic calcifications involving the aorta and iliac arteries. No aneurysm. Enlarged metastatic periportal, celiac axis, peripancreatic and retroperitoneal lymph nodes. Other:  No ascites or abdominal wall hernia. Musculoskeletal: No significant bony findings IMPRESSION: 1. Enhancing and diffusion positive tumor in the gallbladder fundus consistent with gallbladder carcinoma. 2. Hepatic  metastatic disease and upper abdominal metastatic lymphadenopathy. Electronically Signed   By: Marijo Sanes M.D.   On: 07/21/2016 15:02   Ct Abdomen Pelvis W Contrast  Result Date: 07/19/2016 CLINICAL DATA:  Abdominal pain EXAM: CT ABDOMEN AND PELVIS WITH CONTRAST TECHNIQUE: Multidetector CT imaging of the abdomen and pelvis was performed using the standard protocol following bolus administration of intravenous contrast. Oral contrast was also administered. CONTRAST:  128mL ISOVUE-300 IOPAMIDOL (ISOVUE-300) INJECTION  61% COMPARISON:  CT abdomen and pelvis July 24, 2014 FINDINGS: Lower chest: There is bibasilar lung atelectasis. Heart is mildly enlarged. There is a focal area of decreased attenuation in the region of the left ventricle seen on axial slice 1 series 2 measuring 1.5 x 1.4 cm. There is a questionable pulmonary embolus in a right lower lobe pulmonary artery seen on axial slice 1 series 2. Hepatobiliary: There is an infiltrative appearing mass in the anterior segment of the right lobe of the liver measuring 7.7 x 4.9 cm. No other focal liver lesion is evident. There is extensive sludge in the gallbladder. An underlying mass in the gallbladder cannot be excluded. The gallbladder wall appears rather prominent by CT. There is no appreciable biliary duct dilatation. Pancreas: No pancreatic mass or inflammatory focus evident. Spleen: No splenic lesions are evident. Adrenals/Urinary Tract: Adrenals appear unremarkable bilaterally. There are two cysts arising from the upper pole of the left kidney eccentrically measuring just over 1 cm in size each. No non cystic liver lesions are evident. There is no hydronephrosis on either side. There is fetal lobulation on each side, an anatomic variant. There is no appreciable renal or ureteral calculus on either side. Urinary bladder is midline with wall thickness within normal limits. Stomach/Bowel: There are scattered colonic diverticula, primarily in the sigmoid region without diverticulitis. There is fairly diffuse stool throughout much of the colon. There is a right-sided femoral hernia which contains small bowel but no bowel compromise. There is no bowel dilatation or air-fluid level suggesting bowel obstruction. No free air or portal venous air is evident. Vascular/Lymphatic: There is extensive atherosclerotic calcification in the aorta and major pelvic arterial vessels diffusely. No aneurysms are evident. Major mesenteric arterial vessels appear patent with moderate  atherosclerotic calcification noted, particularly at the origin of the left renal artery. There is periportal region adenopathy. The largest of the periportal lymph nodes measures 4.4 x 2.3 cm. There is no adenopathy distant to the periportal region. Reproductive: Uterus appears absent. No pelvic mass or pelvic fluid collection evident. Other: Appendix not appreciable. No periappendiceal region inflammation. No abscess or ascites evident in the abdomen or pelvis. Musculoskeletal: There is extensive degenerative change in the lumbar spine. There is mild anterolisthesis of L4 on L5 and mild anterolisthesis of L5 on S1, findings present previously. There are no blastic or lytic bone lesions. There is no intramuscular or abdominal wall lesion. IMPRESSION: Large liver lesion felt to represent neoplastic focus. There is extensive periportal adenopathy. The gallbladder contains sludge with questionable mass within the gallbladder. Gallbladder wall appears borderline thickened. These findings raise concern for possible carcinoma arising from the gallbladder with liver metastasis and periportal adenopathy. Primary liver carcinoma is a differential consideration. Given the proclivity of colon carcinoma to metastasize to the liver, it may be prudent to consider direct visualization of the colon in this regard. Questionable pulmonary embolus in a right lower lobe pulmonary artery branch. This finding may warrant CT angiogram of the chest for further assessment. Questionable lesion in the left ventricle. This  finding may warrant echocardiography to further assess. There is extensive aortic atherosclerosis as well as atherosclerosis throughout the major pelvic vessels. No aneurysm evident. Right-sided femoral hernia containing small bowel but no bowel compromise. Critical Value/emergent results were called by telephone at the time of interpretation on 07/19/2016 at 10:09 am to [place], [place] , who verbally acknowledged  these results. Electronically Signed   By: Lowella Grip III M.D.   On: 07/19/2016 10:10   Dg Chest Port 1 View  Result Date: 07/04/2016 CLINICAL DATA:  Chest pain tonight.  Bradycardia. EXAM: PORTABLE CHEST 1 VIEW COMPARISON:  06/17/2016 FINDINGS: Shallow inspiration. Linear atelectasis in the lung bases. No focal airspace disease or consolidation. No blunting of costophrenic angles. No pneumothorax. Calcification of the aorta. Degenerative changes in the spine and shoulders. IMPRESSION: Linear atelectasis in the lung bases.  No focal consolidation. Electronically Signed   By: Lucienne Capers M.D.   On: 07/04/2016 02:04    Time Spent in minutes  25   Louellen Molder M.D on 07/23/2016 at 12:07 PM  Between 7am to 7pm - Pager - [phone]  After 7pm go to [email or web address] - password [facility]  [facility] -  Office  [phone]

## 2016-07-23 NOTE — Progress Notes (Signed)
ANTICOAGULATION CONSULT NOTE - Follow Up Consult  Pharmacy Consult for Heparin Indication: pulmonary embolus  Allergies  Allergen Reactions  . Amoxicillin-Pot Clavulanate Diarrhea    Has patient had a PCN reaction causing immediate rash, facial/tongue/throat swelling, SOB or lightheadedness with hypotension: No Has patient had a PCN reaction causing severe rash involving mucus membranes or skin necrosis: No Has patient had a PCN reaction that required hospitalization No Has patient had a PCN reaction occurring within the last 10 years: Yes If all of the above answers are "NO", then may proceed with Cephalosporin use.   . Aspirin Nausea And Vomiting  . Atorvastatin     REACTION: feel weak  . Carisoprodol     REACTION: intolerant  . Codeine     REACTION: hallucinations  . Codeine Phosphate     REACTION: UNKNOWN  . Cortisone     REACTION: pt. reports allergy, reaction not known  . Duoderm Hydroactive     Skin irritation and pain     . Ezetimibe-Simvastatin     REACTION: feels weak  . Naproxen Sodium     REACTION: GI symptoms  . Omeprazole     REACTION: abd pain  . Quinapril Hcl     REACTION: reports allergy, reaction not known  . Rofecoxib     REACTION: reports allergy, reaction not known  . Silvadene [Silver Sulfadiazine]     Skin irritation and pain   . Xarelto [Rivaroxaban] Other (See Comments)    Pain     Patient Measurements: Height: 5\' 7"  (170.2 cm) Weight: 213 lb (96.6 kg) IBW/kg (Calculated) : 61.6 Heparin Dosing Weight: 83kg  Vital Signs: Temp: 98.6 F (37 C) (11/30 2131) Temp Source: Oral (11/30 2131) BP: 111/62 (11/30 2131) Pulse Rate: 70 (11/30 2131)  Labs:  Recent Labs  07/20/16 1055  07/20/16 2106 07/21/16 0530 07/22/16 0521 07/23/16 0439  HGB 8.4*  < >  --  8.0* 7.9* 8.2*  HCT 26.4*  < >  --  25.4* 25.3* 26.6*  PLT 293  < >  --  286 273 314  APTT 93*  --  68*  --   --   --   LABPROT  --   --   --   --   --  13.3  INR  --   --   --   --    --  1.01  HEPARINUNFRC 0.94*  --  0.58 0.42 0.41 0.27*  CREATININE  --   --   --  0.73  --  0.84  < > = values in this interval not displayed.  Estimated Creatinine Clearance: 65.9 mL/min (by C-G formula based on SCr of 0.84 mg/dL).   Medications:  Infusions:  . heparin      Assessment: 53 yoF with PMHx polymyalgia rheumatica, chronic pain, IBS, diverticulitis, afib with SVT and PVCs, HTN/HLD, anemia, COPD and obesity presents with right sided abdominal pain.  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  Pharmacy consulted to start IV heparin.  Discussed case with PA as pt has positive FOB and hemoglobin has dropped 10.2 >> 8.5 in the last two weeks.  Will begin very conservatively with heparin and omit bolus.    Patient was on xarelto but stopped d/t GI intolerance, Cardiology started her on Eliquis but unable to afford. Patient states she has not been on anticoagulation for ~ 1 week.   11/30  Heparin level therapeutic on 900 units/hr  CBC: repeat  Hgb low/stable, pltc WNL  FOBT was positive; GI signed off as no interventions warranted other than blood/meds   No obvious bleeding Today, 12/1  0439 HL=0.27 ,no infusion problems (slightly below goal), small amt of bleeding form IV site (not significant per RN)  Goal of Therapy:  Heparin level 0.3-0.7 units/ml, prefer 0.3-0.5 until rule out GIB Monitor platelets by anticoagulation protocol: Yes   Plan:   Increase heparin drip to 1000 units/hr   Recheck HL in 8 hours  Daily CBC and heparin level  Likely transition to warfarin when appropriate.  May need liver or gallbladder biopsy (Onc consulted, GI s/o)  Dorrene German 07/23/2016, 5:45 AM

## 2016-07-24 DIAGNOSIS — R1011 Right upper quadrant pain: Secondary | ICD-10-CM

## 2016-07-24 DIAGNOSIS — Z515 Encounter for palliative care: Secondary | ICD-10-CM

## 2016-07-24 LAB — CBC
HEMATOCRIT: 29.6 % — AB (ref 36.0–46.0)
HEMOGLOBIN: 8.9 g/dL — AB (ref 12.0–15.0)
MCH: 27.2 pg (ref 26.0–34.0)
MCHC: 30.1 g/dL (ref 30.0–36.0)
MCV: 90.5 fL (ref 78.0–100.0)
Platelets: 332 10*3/uL (ref 150–400)
RBC: 3.27 MIL/uL — ABNORMAL LOW (ref 3.87–5.11)
RDW: 17.9 % — ABNORMAL HIGH (ref 11.5–15.5)
WBC: 9.4 10*3/uL (ref 4.0–10.5)

## 2016-07-24 LAB — HEPATIC FUNCTION PANEL
ALBUMIN: 3 g/dL — AB (ref 3.5–5.0)
ALT: 69 U/L — ABNORMAL HIGH (ref 14–54)
AST: 80 U/L — AB (ref 15–41)
Alkaline Phosphatase: 159 U/L — ABNORMAL HIGH (ref 38–126)
BILIRUBIN TOTAL: 1 mg/dL (ref 0.3–1.2)
Bilirubin, Direct: 0.2 mg/dL (ref 0.1–0.5)
Indirect Bilirubin: 0.8 mg/dL (ref 0.3–0.9)
TOTAL PROTEIN: 7.3 g/dL (ref 6.5–8.1)

## 2016-07-24 LAB — HEPARIN LEVEL (UNFRACTIONATED)
HEPARIN UNFRACTIONATED: 0.41 [IU]/mL (ref 0.30–0.70)
Heparin Unfractionated: 0.25 IU/mL — ABNORMAL LOW (ref 0.30–0.70)

## 2016-07-24 MED ORDER — ENOXAPARIN SODIUM 100 MG/ML ~~LOC~~ SOLN
100.0000 mg | Freq: Once | SUBCUTANEOUS | Status: AC
  Administered 2016-07-25: 100 mg via SUBCUTANEOUS
  Filled 2016-07-24: qty 1

## 2016-07-24 MED ORDER — ENOXAPARIN (LOVENOX) PATIENT EDUCATION KIT
PACK | Freq: Once | Status: AC
  Administered 2016-07-24: 19:00:00
  Filled 2016-07-24: qty 1

## 2016-07-24 MED ORDER — ONDANSETRON HCL 4 MG/2ML IJ SOLN
4.0000 mg | Freq: Four times a day (QID) | INTRAMUSCULAR | Status: DC | PRN
  Administered 2016-07-25 – 2016-07-27 (×2): 4 mg via INTRAVENOUS
  Filled 2016-07-24 (×2): qty 2

## 2016-07-24 MED ORDER — ENOXAPARIN SODIUM 100 MG/ML ~~LOC~~ SOLN
100.0000 mg | Freq: Two times a day (BID) | SUBCUTANEOUS | Status: DC
  Administered 2016-07-24 – 2016-07-27 (×6): 100 mg via SUBCUTANEOUS
  Filled 2016-07-24 (×6): qty 1

## 2016-07-24 MED ORDER — MORPHINE SULFATE ER 30 MG PO TBCR
30.0000 mg | EXTENDED_RELEASE_TABLET | Freq: Three times a day (TID) | ORAL | Status: DC
  Administered 2016-07-24 – 2016-07-26 (×6): 30 mg via ORAL
  Filled 2016-07-24 (×7): qty 1

## 2016-07-24 MED ORDER — ENOXAPARIN SODIUM 100 MG/ML ~~LOC~~ SOLN
100.0000 mg | Freq: Once | SUBCUTANEOUS | Status: AC
  Administered 2016-07-24: 100 mg via SUBCUTANEOUS
  Filled 2016-07-24: qty 1

## 2016-07-24 MED ORDER — SENNA 8.6 MG PO TABS: 1.0000 | ORAL_TABLET | Freq: Every day | ORAL | Status: DC | PRN

## 2016-07-24 NOTE — Progress Notes (Signed)
PROGRESS NOTE                                                                                                                                                                                                             Patient Demographics:    [name], is a 80 y.o. female, DOB - 08-14-1936, [id number]  Admit date - 07/19/2016   Admitting Physician Velvet Bathe, MD  Outpatient Primary MD for the patient is [name], MD  LOS - 5  Outpatient Specialists: Dr Irene Limbo  Chief Complaint  Patient presents with  . Abdominal Pain       Brief Narrative   80 year old female with history of A. fib on anticoagulation, polymyalgia rheumatica on low-dose prednisone, IBS, COPD, MGUS (follows with Dr. Irene Limbo) presented with abdominal discomfort for last few days. She has also had almost 20 pound weight loss in the past 3 months. In the ED CT of the abdomen and pelvis showed a large liver lesion with extensive periportal adenopathy. Also showed gallbladder with sludge with questionable mass within the gallbladder and thickened wall. A CT angiogram of the chest done was positive for acute PE in the right middle and lower lobes. (Also showed a 1 cm left lower lobe pulmonary nodule).    Subjective:   Simona Huh to have right upper quadrant pain.   Assessment  & Plan :    Principal Problem:   Pulmonary embolism without acute cor pulmonale ([place])  Patient was on Xarelto at home but due to cost issues has not been able to take them. Able to afford lovenox, so transitioned to it.  Hemoccult-positive. H&H is currently stable.  -appreciated GI eval. No intervention needed.   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 -Scheduled for CT-guided liver biopsy today. -AFP negative and CEA markedly elevated (146). Appreciate oncology consult. Recommends if this is a gallbladder malignancy by pathology and would be palliative.   Persistent abdominal pain Continues to be an issue. MS Contin dose adjusted, increase dose of oxycodone and switched morphine to Dilaudid without significant relief. Palliative care consult appreciated for pain management. Switched MS Contin to every 8 hours, continue oral oxycodone for breakthrough pain  and IV Dilaudid. Added bowel regimen. If biopsy turns out to be a gallbladder malignancy palliative care would be involved for goals of care discussion.  Anemia of chronic disease Monitor hemoglobin closely while on heparin drip. Hemoccult positive. No need for colonoscopy at present per GI.  Hypothyroidism Continue Synthroid.  Chronic A. fib Rate controlled with metoprolol. Switch IV heparin to Lovenox.    PMR (polymyalgia rheumatica) (HCC) Continue low-dose prednisone. Adjusted home MS Contin.  Transaminitis LFTs trending up possibly due to liver metastases. Monitor closely.   Code Status : Full code  Family Communication  : none at bedside today  Disposition Plan  : Home once abdominal pain better controlled, possibly early next week.  Barriers For Discharge : Active symptoms  Consults  :  [facility] (Dr. Irene Limbo) Palliative care  Procedures  :  CT abdomen, chest with PE protocol MRI abdomen Liver biopsy  DVT Prophylaxis  : Subcutaneous Lovenox  Lab Results  Component Value Date   PLT 332 07/24/2016    Antibiotics  :    Anti-infectives    None        Objective:   Vitals:   07/23/16 2252 07/24/16 0642 07/24/16 0902 07/24/16 1350  BP: 129/80 104/61 (!) 97/53 (!) 95/47  Pulse: 70 77 61 (!) 57  Resp: 20 18  16   Temp: 98 F (36.7 C) 97.9 F (36.6 C)  98.2 F (36.8 C)  TempSrc: Oral Oral  Oral  SpO2: 93% 92%  95%    Weight:      Height:        Wt Readings from Last 3 Encounters:  07/19/16 96.6 kg (213 lb)  07/12/16 97.5 kg (215 lb)  07/04/16 98 kg (216 lb)     Intake/Output Summary (Last 24 hours) at 07/24/16 1354 Last data filed at 07/24/16 0600  Gross per 24 hour  Intake           551.33 ml  Output                0 ml  Net           551.33 ml     Physical Exam  Gen: Appears fatigued HEENT: moist mucosa, supple neck Chest: clear b/l, no added sounds CVS: N S1&S2, no murmurs,  GI: soft, nondistended, bowel sounds present, right upper quadrant tenderness Musculoskeletal: warm, no edema     Data Review:    CBC  Recent Labs Lab 07/19/16 0705  07/20/16 1718 07/21/16 0530 07/22/16 0521 07/23/16 0439 07/24/16 0527  WBC 7.1  < > 8.3 7.4 6.1 6.9 9.4  HGB 8.5*  < > 8.1* 8.0* 7.9* 8.2* 8.9*  HCT 26.5*  < > 25.7* 25.4* 25.3* 26.6* 29.6*  PLT 261  < > 298 286 273 314 332  MCV 87.2  < > 86.8 89.4 90.4 90.8 90.5  MCH 28.0  < > 27.4 28.2 28.2 28.0 27.2  MCHC 32.1  < > 31.5 31.5 31.2 30.8 30.1  RDW 17.7*  < > 17.9* 18.2* 18.1* 18.0* 17.9*  LYMPHSABS 1.3  --   --   --   --  2.2  --   MONOABS 0.6  --   --   --   --  0.6  --   EOSABS 0.1  --   --   --   --  0.1  --   BASOSABS 0.0  --   --   --   --  0.0  --   < > =  values in this interval not displayed.  Chemistries   Recent Labs Lab 07/19/16 0705 07/20/16 0058 07/21/16 0530 07/23/16 0439 07/24/16 0527  NA 135 133* 135 136  --   K 4.0 3.9 3.8 3.8  --   CL 102 101 102 101  --   CO2 25 28 27 27   --   GLUCOSE 124* 109* 98 106*  --   BUN 16 12 11 15   --   CREATININE 0.77 0.87 0.73 0.84  --   CALCIUM 9.6 8.9 8.7* 8.8*  --   AST 21  --   --  155* 80*  ALT 13*  --   --  89* 69*  ALKPHOS 98  --   --  151* 159*  BILITOT 0.7  --   --  0.3 1.0   ------------------------------------------------------------------------------------------------------------------ No results for input(s): CHOL, HDL, LDLCALC, TRIG, CHOLHDL,  LDLDIRECT in the last 72 hours.  Lab Results  Component Value Date   HGBA1C 5.7 (H) 06/17/2016   ------------------------------------------------------------------------------------------------------------------ No results for input(s): TSH, T4TOTAL, T3FREE, THYROIDAB in the last 72 hours.  Invalid input(s): FREET3 ------------------------------------------------------------------------------------------------------------------ No results for input(s): VITAMINB12, FOLATE, FERRITIN, TIBC, IRON, RETICCTPCT in the last 72 hours.  Coagulation profile  Recent Labs Lab 07/19/16 1516 07/23/16 0439  INR 1.05 1.01    No results for input(s): DDIMER in the last 72 hours.  Cardiac Enzymes No results for input(s): CKMB, TROPONINI, MYOGLOBIN in the last 168 hours.  Invalid input(s): CK ------------------------------------------------------------------------------------------------------------------    Component Value Date/Time   BNP 197.8 (H) 07/04/2016 0056   BNP 130.2 (H) 09/22/2015 [id number]    Inpatient Medications  Scheduled Meds: . levothyroxine  88 mcg Oral QAC breakfast  . metoprolol succinate  150 mg Oral Daily  . morphine  30 mg Oral Q8H  . pantoprazole  40 mg Oral BID  . predniSONE  1 mg Oral Q breakfast  . sertraline  50 mg Oral Daily  . sodium chloride flush  3 mL Intravenous Q12H  . sodium chloride flush  3 mL Intravenous Q12H   Continuous Infusions: . heparin 1,000 Units/hr (07/24/16 0903)   PRN Meds:.sodium chloride, HYDROmorphone (DILAUDID) injection, meclizine, ondansetron (ZOFRAN) IV, oxyCODONE, senna, sodium chloride flush  Micro Results No results found for this or any previous visit (from the past 240 hour(s)).  Radiology Reports Ct Angio Chest Pe W And/or Wo Contrast  Result Date: 07/19/2016 CLINICAL DATA:  Abnormal CT abdomen and pelvis.  Abdominal pain. EXAM: CT ANGIOGRAPHY CHEST WITH CONTRAST TECHNIQUE: Multidetector CT imaging of the chest was  performed using the standard protocol during bolus administration of intravenous contrast. Multiplanar CT image reconstructions and MIPs were obtained to evaluate the vascular anatomy. CONTRAST:  100 cc Isovue 370 COMPARISON:  07/04/2016 FINDINGS: Cardiovascular: There is low-density filling defect within the right pulmonary artery segmental branch of the lateral basal segment of the right lower lobe. It extends into subsegmental branches. This is compatible with pulmonary thromboembolism. There is also segmental and subsegmental pulmonary thromboembolism at the base of the right middle lobe. There are no central filling defects in the pulmonary arterial tree. No evidence of left pulmonary thromboembolism. Coronary artery calcifications. Mediastinum/Nodes: No abnormal mediastinal adenopathy. Right infrahilar lymphoid tissue there is stable and mildly prominent. Lungs/Pleura: Advanced apical centrilobular emphysema. There is a questionable nodular density in the superior segment of the left lower lobe on image 42 of series 14, measuring 1.0 cm. There is scattered volume loss in the lungs. Dependent atelectasis bilaterally. No pneumothorax. No  pleural effusion. Upper Abdomen: Simple cysts in the left kidney. Musculoskeletal: No vertebral compression deformity. Mild scoliosis of the thoracic spine. Review of the MIP images confirms the above findings. IMPRESSION: The study is positive for acute pulmonary thromboembolism in the right middle and lower lobes. This affects segmental and subsegmental branches. Possible 1.0 cm left lower lobe pulmonary nodule. Electronically Signed   By: Marybelle Killings M.D.   On: 07/19/2016 13:36   Ct Angio Chest Pe W And/or Wo Contrast  Result Date: 07/04/2016 CLINICAL DATA:  Positive D-dimer. History of hypertension and arrhythmias. EXAM: CT ANGIOGRAPHY CHEST WITH CONTRAST TECHNIQUE: Multidetector CT imaging of the chest was performed using the standard protocol during bolus  administration of intravenous contrast. Multiplanar CT image reconstructions and MIPs were obtained to evaluate the vascular anatomy. CONTRAST:  100 mL Isovue 370 COMPARISON:  None. FINDINGS: Cardiovascular: Examination of the pulmonary arteries is somewhat limited by contrast bolus and motion artifact but there is no definite evidence of significant pulmonary embolus. Normal heart size. No pericardial effusion. Calcification of the aorta and coronary arteries. Mediastinum/Nodes: No enlarged mediastinal, hilar, or axillary lymph nodes. Thyroid gland, trachea, and esophagus demonstrate no significant findings. Lungs/Pleura: Atelectasis in the lung bases. Emphysematous changes particularly in the upper lungs. Slight fibrosis. No focal consolidation. No pleural effusions. No pneumothorax. Upper Abdomen: The appearance of the liver suggests hepatic cirrhosis. The lateral segment of the left lobe is enlarged and there appears to be mild lobulation to the contour. The gallbladder is distended and filled with mildly hyperechoic material, likely representing sludge although small stones or milk of calcium could also have this appearance. No gallbladder wall thickening. No bile duct dilatation. Musculoskeletal: No chest wall abnormality. No acute or significant osseous findings. Review of the MIP images confirms the above findings. IMPRESSION: No evidence of significant pulmonary embolus. Emphysematous changes, fibrosis, and atelectasis in the lungs. Aortic atherosclerosis. Electronically Signed   By: Lucienne Capers M.D.   On: 07/04/2016 05:18   Mr Abdomen W Wo Contrast  Result Date: 07/21/2016 CLINICAL DATA:  Followup abnormal CT scan of the abdomen demonstrating a gallbladder mass and hepatic metastatic disease. EXAM: MRI ABDOMEN WITHOUT AND WITH CONTRAST TECHNIQUE: Multiplanar multisequence MR imaging of the abdomen was performed both before and after the administration of intravenous contrast. CONTRAST:  69mL  MULTIHANCE GADOBENATE DIMEGLUMINE 529 MG/ML IV SOLN COMPARISON:  CT scan 07/19/2016 FINDINGS: Lower chest: The lung bases are grossly clear. No pleural or pericardial effusion. Hepatobiliary: Numerous hepatic lesions are again demonstrated. The largest lesion is in segment 5 and measures 49 mm. There are 3 lesions and segment 4B the largest 2 lesions measure 26 mm and a smaller lesion measures 12 mm. These are all diffusion positive consistent with neoplasm. Enhancing tumor in the fundal region of the gallbladder is also diffusion positive and consistent with gallbladder cancer with hepatic metastatic disease. There are also enlarged periportal and celiac axis lymph nodes as demonstrated on the CT scan which are diffusion positive. Pancreas: No mass, inflammation or ductal dilatation. Peripancreatic adenopathy is again demonstrated. Spleen:  Normal size.  No focal lesions. Adrenals/Urinary Tract: The adrenal glands and kidneys are unremarkable. Stomach/Bowel: The stomach, duodenum, visualized small bowel and visualized colon are unremarkable. Vascular/Lymphatic: Advanced atherosclerotic calcifications involving the aorta and iliac arteries. No aneurysm. Enlarged metastatic periportal, celiac axis, peripancreatic and retroperitoneal lymph nodes. Other:  No ascites or abdominal wall hernia. Musculoskeletal: No significant bony findings IMPRESSION: 1. Enhancing and diffusion positive tumor in the gallbladder fundus  consistent with gallbladder carcinoma. 2. Hepatic metastatic disease and upper abdominal metastatic lymphadenopathy. Electronically Signed   By: Marijo Sanes M.D.   On: 07/21/2016 15:02   Ct Abdomen Pelvis W Contrast  Result Date: 07/19/2016 CLINICAL DATA:  Abdominal pain EXAM: CT ABDOMEN AND PELVIS WITH CONTRAST TECHNIQUE: Multidetector CT imaging of the abdomen and pelvis was performed using the standard protocol following bolus administration of intravenous contrast. Oral contrast was also  administered. CONTRAST:  130mL ISOVUE-300 IOPAMIDOL (ISOVUE-300) INJECTION 61% COMPARISON:  CT abdomen and pelvis July 24, 2014 FINDINGS: Lower chest: There is bibasilar lung atelectasis. Heart is mildly enlarged. There is a focal area of decreased attenuation in the region of the left ventricle seen on axial slice 1 series 2 measuring 1.5 x 1.4 cm. There is a questionable pulmonary embolus in a right lower lobe pulmonary artery seen on axial slice 1 series 2. Hepatobiliary: There is an infiltrative appearing mass in the anterior segment of the right lobe of the liver measuring 7.7 x 4.9 cm. No other focal liver lesion is evident. There is extensive sludge in the gallbladder. An underlying mass in the gallbladder cannot be excluded. The gallbladder wall appears rather prominent by CT. There is no appreciable biliary duct dilatation. Pancreas: No pancreatic mass or inflammatory focus evident. Spleen: No splenic lesions are evident. Adrenals/Urinary Tract: Adrenals appear unremarkable bilaterally. There are two cysts arising from the upper pole of the left kidney eccentrically measuring just over 1 cm in size each. No non cystic liver lesions are evident. There is no hydronephrosis on either side. There is fetal lobulation on each side, an anatomic variant. There is no appreciable renal or ureteral calculus on either side. Urinary bladder is midline with wall thickness within normal limits. Stomach/Bowel: There are scattered colonic diverticula, primarily in the sigmoid region without diverticulitis. There is fairly diffuse stool throughout much of the colon. There is a right-sided femoral hernia which contains small bowel but no bowel compromise. There is no bowel dilatation or air-fluid level suggesting bowel obstruction. No free air or portal venous air is evident. Vascular/Lymphatic: There is extensive atherosclerotic calcification in the aorta and major pelvic arterial vessels diffusely. No aneurysms are  evident. Major mesenteric arterial vessels appear patent with moderate atherosclerotic calcification noted, particularly at the origin of the left renal artery. There is periportal region adenopathy. The largest of the periportal lymph nodes measures 4.4 x 2.3 cm. There is no adenopathy distant to the periportal region. Reproductive: Uterus appears absent. No pelvic mass or pelvic fluid collection evident. Other: Appendix not appreciable. No periappendiceal region inflammation. No abscess or ascites evident in the abdomen or pelvis. Musculoskeletal: There is extensive degenerative change in the lumbar spine. There is mild anterolisthesis of L4 on L5 and mild anterolisthesis of L5 on S1, findings present previously. There are no blastic or lytic bone lesions. There is no intramuscular or abdominal wall lesion. IMPRESSION: Large liver lesion felt to represent neoplastic focus. There is extensive periportal adenopathy. The gallbladder contains sludge with questionable mass within the gallbladder. Gallbladder wall appears borderline thickened. These findings raise concern for possible carcinoma arising from the gallbladder with liver metastasis and periportal adenopathy. Primary liver carcinoma is a differential consideration. Given the proclivity of colon carcinoma to metastasize to the liver, it may be prudent to consider direct visualization of the colon in this regard. Questionable pulmonary embolus in a right lower lobe pulmonary artery branch. This finding may warrant CT angiogram of the chest for further assessment. Questionable  lesion in the left ventricle. This finding may warrant echocardiography to further assess. There is extensive aortic atherosclerosis as well as atherosclerosis throughout the major pelvic vessels. No aneurysm evident. Right-sided femoral hernia containing small bowel but no bowel compromise. Critical Value/emergent results were called by telephone at the time of interpretation on  07/19/2016 at 10:09 am to Hollis II, PA , who verbally acknowledged these results. Electronically Signed   By: Lowella Grip III M.D.   On: 07/19/2016 10:10   US Biopsy  Result Date: 07/23/2016 INDICATION: GALLBLADDER MASS WITH HEPATIC LESIONS COMPATIBLE WITH METASTATIC DISEASE. EXAM: ULTRASOUND BIOPSY CORE LIVER MEDICATIONS: 1% LIDOCAINE LOCALLY ANESTHESIA/SEDATION: Moderate (conscious) sedation was employed during this procedure. A total of Versed 1.5 mg and Fentanyl 37.5 mcg was administered intravenously. Moderate Sedation Time: 7 minutes. The patient's level of consciousness and vital signs were monitored continuously by radiology nursing throughout the procedure under my direct supervision. FLUOROSCOPY TIME:  Fluoroscopy Time: NONE. COMPLICATIONS: COMPLICATIONS None immediate. PROCEDURE: Informed written consent was obtained from the patient after a thorough discussion of the procedural risks, benefits and alternatives. All questions were addressed. Maximal Sterile Barrier Technique was utilized including caps, mask, sterile gowns, sterile gloves, sterile drape, hand hygiene and skin antiseptic. A timeout was performed prior to the initiation of the procedure. Previous imaging reviewed. The right hepatic mass adjacent to the gallbladder was localized. Overlying skin marked. Under sterile conditions and local anesthesia, a 17 gauge 6.8 cm access needle was advanced percutaneously into the right hepatic mass. Needle position confirmed with ultrasound. Images obtained for documentation. 3 18 gauge core biopsies obtained. Needle removed. Postprocedure imaging demonstrates no hemorrhage or hematoma. Samples placed in formalin. Patient tolerated the biopsy well. IMPRESSION: Successful ultrasound right hepatic mass 18 gauge core biopsy Electronically Signed   By: Jerilynn Mages.  Shick M.D.   On: 07/23/2016 12:43   Dg Chest Port 1 View  Result Date: 07/04/2016 CLINICAL DATA:  Chest pain tonight.   Bradycardia. EXAM: PORTABLE CHEST 1 VIEW COMPARISON:  06/17/2016 FINDINGS: Shallow inspiration. Linear atelectasis in the lung bases. No focal airspace disease or consolidation. No blunting of costophrenic angles. No pneumothorax. Calcification of the aorta. Degenerative changes in the spine and shoulders. IMPRESSION: Linear atelectasis in the lung bases.  No focal consolidation. Electronically Signed   By: Lucienne Capers M.D.   On: 07/04/2016 02:04    Time Spent in minutes  25   Louellen Molder M.D on 07/24/2016 at 1:54 PM  Between 7am to 7pm - Pager - [phone]  After 7pm go to [email or web address] - password [facility]  [facility] -  Office  [phone]

## 2016-07-24 NOTE — Consult Note (Signed)
Consultation Note Date: 07/24/2016   Patient Name: [name]  DOB: 1935/10/20  MRN: [id number]  Age / Sex: 80 y.o., female  PCP: Abner Greenspan, MD Referring Physician: Louellen Molder, MD  Reason for Consultation: Pain control  Life limiting illness: Multiple liver metastases and concern for metastatic gallbladder cancer.  HPI/Patient Profile: 80 y.o. female     admitted on 07/19/2016     Clinical Assessment and Goals of Care:  80 year old lady with a past medical history significant for chronic pain, history of polyp magic rheumatica, MGUS for which she was following with hematology earlier this year. Medical history also pertinent for several hospitalizations earlier this year for urinary tract infection, possible arterial disease/thrombosis.   Patient has been admitted because of right-sided abdominal discomfort. She states she was diagnosed with a blood clot in her lungs recently. Pertinent acute medical issues being addressed in this hospitalization include concern for metastatic malignancy, multiple liver metastases, possible malignant gallbladder etiology, elevated CEA and CA-19-9 levels, acute pulmonary embolism in the right middle and right lower lobe, anxiety, uncontrolled pain and discomfort. Patient is being followed by medical oncology. She is maintained on IV heparin, underwent CT-guided biopsy of her liver lesion performed by interventional radiology on 12-1, biopsy results pending. Palliative care has been consultative for pain management and for eventual consideration for goals of care discussions in the near future. Patient is an age-appropriate appearing lady resting in bed. I introduced myself and palliative care as follows: Palliative medicine is specialized medical care for people living with serious illness. It focuses on providing relief from the symptoms and stress of a serious  illness. The goal is to improve quality of life for both the patient and the family.  Patient states that her current pain regimen is for the most part working for her. At times, she has uncontrolled episodes of pain. When she does receive either IV Dilaudid or PO oxycodone immediate release, she does experience pain relief. She denies any nausea. She states that she is having regular bowel movements. At times, she does have severe pain in her side.  Patient's pain regimen discussed with her in detail. All questions answered. Agree with most of the current pain management options. Await biopsy results. Palliative will continue to follow. See recommendations below. Thank you for the consult.  NEXT OF KIN  husband.   SUMMARY OF RECOMMENDATIONS    1. Pain control: Patient's pain regimen reviewed. Agree with MS Contin, change dose to every 8 hours. Continue IV Dilaudid for severe breakthrough pain, continue oral oxycodone immediate release for mild to moderate breakthrough pain. Will add bowel and antiemetic regimen and monitor Wound care consult for her toes that are wrapped in dressing Palliative will continue to follow along, engage in goals of care discussions once biopsy results are available. Appreciate oncology input. Dr. Grier Mitts notes reviewed in detail.  Code Status/Advance Care Planning:  Full code    Symptom Management:    As above  Palliative Prophylaxis:   Bowel Regimen  Additional  Recommendations (Limitations, Scope, Preferences):  Full Scope Treatment  Psycho-social/Spiritual:   Desire for further Chaplaincy support:no  Additional Recommendations: Caregiving  Support/Resources  Prognosis:   Unable to determine  Discharge Planning: To Be Determined      Primary Diagnoses: Present on Admission: . Depression . Essential hypertension . Pulmonary embolism without acute cor pulmonale ([place]) . PMR (polymyalgia rheumatica) ([facility]) . Hypothyroidism . Gallbladder  mass . Liver metastases ([place])   I have reviewed the medical record, interviewed the patient and family, and examined the patient. The following aspects are pertinent.  Past Medical History:  Diagnosis Date  . Chronic pain    Managed by Dr. Hardin Negus  . Diverticulitis   . Hyperlipidemia   . Hypertension   . IBS (irritable bowel syndrome)   . Palpitations   . Polymyalgia rheumatica ([place])   . PVC's (premature ventricular contractions)   . SVT (supraventricular tachycardia) ([place])   . Tobacco dependence    Social History   Social History  . Marital status: Married    Spouse name: N/A  . Number of children: 2  . Years of education: N/A   Occupational History  .  Retired   Social History Main Topics  . Smoking status: Former Smoker    Packs/day: 0.50    Quit date: 12/12/2011  . Smokeless tobacco: Never Used  . Alcohol use No  . Drug use: No  . Sexual activity: Not Currently    Birth control/ protection: None   Other Topics Concern  . None   Social History Narrative  . None   Family History  Problem Relation Age of Onset  . Diabetes Mother   . Heart attack Father   . Heart disease Sister    Scheduled Meds: . levothyroxine  88 mcg Oral QAC breakfast  . metoprolol succinate  150 mg Oral Daily  . morphine  30 mg Oral Q8H  . pantoprazole  40 mg Oral BID  . predniSONE  1 mg Oral Q breakfast  . sertraline  50 mg Oral Daily  . sodium chloride flush  3 mL Intravenous Q12H  . sodium chloride flush  3 mL Intravenous Q12H   Continuous Infusions: . heparin 1,000 Units/hr (07/24/16 0903)   PRN Meds:.sodium chloride, HYDROmorphone (DILAUDID) injection, meclizine, ondansetron (ZOFRAN) IV, oxyCODONE, senna, sodium chloride flush Medications Prior to Admission:  Prior to Admission medications   Medication Sig Start Date End Date Taking? Authorizing Provider  levothyroxine (SYNTHROID, LEVOTHROID) 88 MCG tablet Take 1 tablet (88 mcg total) by mouth daily. 06/25/16  Yes Abner Greenspan, MD  meclizine (ANTIVERT) 25 MG tablet TAKE 1 TABLET (25 MG TOTAL) BY MOUTH 3 (THREE) TIMES DAILY AS NEEDED FOR DIZZINESS. 03/14/15  Yes Abner Greenspan, MD  metoprolol succinate (TOPROL-XL) 50 MG 24 hr tablet Take 3 tablets (150 mg total) by mouth daily. 06/24/16  Yes Costin Karlyne Greenspan, MD  morphine (MS CONTIN) 15 MG 12 hr tablet Take 1 tablet by mouth daily with breakfast.  01/02/16  Yes Historical Provider, MD  morphine (MS CONTIN) 30 MG 12 hr tablet Take 1 tablet by mouth at bedtime.  01/02/16  Yes Historical Provider, MD  pantoprazole (PROTONIX) 40 MG tablet TAKE 1 TABLET (40 MG TOTAL) BY MOUTH DAILY. 08/26/15  Yes Carnuel, MD  predniSONE (DELTASONE) 1 MG tablet TAKE 4 TABLETS BY MOUTH DAILLY IN THE MORNING IN [place], 3 DAILY SEPTEMBER, 2 DAILY OCTOBER Patient taking differently: Take 1 tablet once daily. 06/23/16  Yes Bo Merino,  MD  sertraline (ZOLOFT) 50 MG tablet TAKE 1 TABLET BY MOUTH EVERY DAY Patient taking differently: TAKE 1 TABLET BY MOUTH EVERY DAY as needed for depression 03/08/16  Yes Abner Greenspan, MD  trolamine salicylate (ASPERCREME) 10 % cream Apply 1 application topically 2 (two) times daily as needed for muscle pain.   Yes Historical Provider, MD   Allergies  Allergen Reactions  . Amoxicillin-Pot Clavulanate Diarrhea    Has patient had a PCN reaction causing immediate rash, facial/tongue/throat swelling, SOB or lightheadedness with hypotension: No Has patient had a PCN reaction causing severe rash involving mucus membranes or skin necrosis: No Has patient had a PCN reaction that required hospitalization No Has patient had a PCN reaction occurring within the last 10 years: Yes If all of the above answers are "NO", then may proceed with Cephalosporin use.   . Aspirin Nausea And Vomiting  . Atorvastatin     REACTION: feel weak  . Carisoprodol     REACTION: intolerant  . Codeine     REACTION: hallucinations  . Codeine Phosphate     REACTION: UNKNOWN  .  Cortisone     REACTION: pt. reports allergy, reaction not known  . Duoderm Hydroactive     Skin irritation and pain     . Ezetimibe-Simvastatin     REACTION: feels weak  . Naproxen Sodium     REACTION: GI symptoms  . Omeprazole     REACTION: abd pain  . Quinapril Hcl     REACTION: reports allergy, reaction not known  . Rofecoxib     REACTION: reports allergy, reaction not known  . Silvadene [Silver Sulfadiazine]     Skin irritation and pain   . Xarelto [Rivaroxaban] Other (See Comments)    Pain    Review of Systems Positive for abdominal pain right-sided Physical Exam Age-appropriate appearing lady resting in bed Currently in no acute distress appears comfortable S1-S2 Lungs clear Abdomen soft mildly distended Awake alert nonfocal Patient has several wounds, callus on her feet. On the left foot, several toes are wrapped in a dressing. Patient states she went to a wound care clinic all summer because of her feet.  Vital Signs: BP (!) 97/53   Pulse 61   Temp 97.9 F (36.6 C) (Oral)   Resp 18   Ht 5\' 7"  (1.702 m)   Wt 96.6 kg (213 lb)   SpO2 92%   BMI 33.36 kg/m  Pain Assessment: 0-10 POSS *See Group Information*: S-Acceptable,Sleep, easy to arouse Pain Score: 8    SpO2: SpO2: 92 % O2 Device:SpO2: 92 % O2 Flow Rate: .O2 Flow Rate (L/min): 1.5 L/min  IO: Intake/output summary:  Intake/Output Summary (Last 24 hours) at 07/24/16 1124 Last data filed at 07/24/16 0600  Gross per 24 hour  Intake           551.33 ml  Output                0 ml  Net           551.33 ml    LBM: Last BM Date: 07/23/16 Baseline Weight: Weight: 96.6 kg (213 lb) Most recent weight: Weight: 96.6 kg (213 lb)     Palliative Assessment/Data:   Flowsheet Rows   Flowsheet Row Most Recent Value  Intake Tab  Referral Department  Hospitalist  Unit at Time of Referral  Med/Surg Unit  Palliative Care Primary Diagnosis  Cancer  Palliative Care Type  New Palliative care  Reason for referral  Pain  Date first seen by Palliative Care  07/24/16  Clinical Assessment  Palliative Performance Scale Score  40%  Pain Max last 24 hours  7  Pain Min Last 24 hours  4  Dyspnea Max Last 24 Hours  4  Dyspnea Min Last 24 hours  3  Nausea Max Last 24 Hours  3  Nausea Min Last 24 Hours  2  Psychosocial & Spiritual Assessment  Palliative Care Outcomes  Patient/Family meeting held?  Yes  Who was at the meeting?  patient   Palliative Care Outcomes  Improved pain interventions      Time In:  10 Time Out:  11 Time Total:  60 min  Greater than 50%  of this time was spent counseling and coordinating care related to the above assessment and plan.  Signed by: Loistine Chance, MD  [phone]  Please contact Palliative Medicine Team phone at [phone] for questions and concerns.  For individual provider: See Shea Evans

## 2016-07-24 NOTE — Progress Notes (Signed)
ANTICOAGULATION CONSULT NOTE - Follow Up Consult  Pharmacy Consult for Heparin Indication: pulmonary embolus  Allergies  Allergen Reactions  . Amoxicillin-Pot Clavulanate Diarrhea    Has patient had a PCN reaction causing immediate rash, facial/tongue/throat swelling, SOB or lightheadedness with hypotension: No Has patient had a PCN reaction causing severe rash involving mucus membranes or skin necrosis: No Has patient had a PCN reaction that required hospitalization No Has patient had a PCN reaction occurring within the last 10 years: Yes If all of the above answers are "NO", then may proceed with Cephalosporin use.   . Aspirin Nausea And Vomiting  . Atorvastatin     REACTION: feel weak  . Carisoprodol     REACTION: intolerant  . Codeine     REACTION: hallucinations  . Codeine Phosphate     REACTION: UNKNOWN  . Cortisone     REACTION: pt. reports allergy, reaction not known  . Duoderm Hydroactive     Skin irritation and pain     . Ezetimibe-Simvastatin     REACTION: feels weak  . Naproxen Sodium     REACTION: GI symptoms  . Omeprazole     REACTION: abd pain  . Quinapril Hcl     REACTION: reports allergy, reaction not known  . Rofecoxib     REACTION: reports allergy, reaction not known  . Silvadene [Silver Sulfadiazine]     Skin irritation and pain   . Xarelto [Rivaroxaban] Other (See Comments)    Pain     Patient Measurements: Height: 5\' 7"  (170.2 cm) Weight: 213 lb (96.6 kg) IBW/kg (Calculated) : 61.6 Heparin Dosing Weight: 83kg  Vital Signs: Temp: 98 F (36.7 C) (12/01 2252) Temp Source: Oral (12/01 2252) BP: 129/80 (12/01 2252) Pulse Rate: 70 (12/01 2252)  Labs:  Recent Labs  07/22/16 0521 07/23/16 0439 07/24/16 0527  HGB 7.9* 8.2* 8.9*  HCT 25.3* 26.6* 29.6*  PLT 273 314 332  LABPROT  --  13.3  --   INR  --  1.01  --   HEPARINUNFRC 0.41 0.27* 0.41  CREATININE  --  0.84  --     Estimated Creatinine Clearance: 65.9 mL/min (by C-G formula  based on SCr of 0.84 mg/dL).   Medications:  Infusions:  . heparin 1,000 Units/hr (07/23/16 1852)    Assessment: 70 yoF with PMHx polymyalgia rheumatica, chronic pain, IBS, diverticulitis, afib with SVT and PVCs, HTN/HLD, anemia, COPD and obesity presents with right sided abdominal pain.  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  Pharmacy consulted to start IV heparin.  Discussed case with PA as pt has positive FOB and hemoglobin has dropped 10.2 >> 8.5 in the last two weeks.  Will begin very conservatively with heparin and omit bolus.    Patient was on xarelto but stopped d/t GI intolerance, Cardiology started her on Eliquis but unable to afford. Patient states she has not been on anticoagulation for ~ 1 week.   Today, 07/24/2016:  Heparin level = 0.41 (therapeutic) on 1000 units/hr this am   CBC: Hgb low/stable, pltc WNL  FOBT was positive but per GI no acute or subacute bleed likely; GI signed off as no interventions warranted other than blood/meds   No obvious bleeding problems  Goal of Therapy:  Heparin level 0.3-0.7 units/ml Monitor platelets by anticoagulation protocol: Yes   Plan:   Continue IV heparin @ 1000 units/hr  Recheck HL in 8 hours to confirm thearpeutic dose  Await transition to  LMWH (with solid tumor) vs . Warfarin since not able to afford [place], PharmD 07/24/2016 6:28 AM

## 2016-07-24 NOTE — Progress Notes (Signed)
ANTICOAGULATION CONSULT NOTE - Follow Up Consult  Pharmacy Consult for Heparin to enoxaparin Indication: pulmonary embolus  Allergies  Allergen Reactions  . Amoxicillin-Pot Clavulanate Diarrhea    Has patient had a PCN reaction causing immediate rash, facial/tongue/throat swelling, SOB or lightheadedness with hypotension: No Has patient had a PCN reaction causing severe rash involving mucus membranes or skin necrosis: No Has patient had a PCN reaction that required hospitalization No Has patient had a PCN reaction occurring within the last 10 years: Yes If all of the above answers are "NO", then may proceed with Cephalosporin use.   . Aspirin Nausea And Vomiting  . Atorvastatin     REACTION: feel weak  . Carisoprodol     REACTION: intolerant  . Codeine     REACTION: hallucinations  . Codeine Phosphate     REACTION: UNKNOWN  . Cortisone     REACTION: pt. reports allergy, reaction not known  . Duoderm Hydroactive     Skin irritation and pain     . Ezetimibe-Simvastatin     REACTION: feels weak  . Naproxen Sodium     REACTION: GI symptoms  . Omeprazole     REACTION: abd pain  . Quinapril Hcl     REACTION: reports allergy, reaction not known  . Rofecoxib     REACTION: reports allergy, reaction not known  . Silvadene [Silver Sulfadiazine]     Skin irritation and pain   . Xarelto [Rivaroxaban] Other (See Comments)    Pain     Patient Measurements: Height: 5\' 7"  (170.2 cm) Weight: 213 lb (96.6 kg) IBW/kg (Calculated) : 61.6 Heparin Dosing Weight: 83kg  Vital Signs: Temp: 98.2 F (36.8 C) (12/02 1350) Temp Source: Oral (12/02 1350) BP: 95/47 (12/02 1350) Pulse Rate: 57 (12/02 1350)  Labs:  Recent Labs  07/22/16 0521 07/23/16 0439 07/24/16 0527 07/24/16 1310  HGB 7.9* 8.2* 8.9*  --   HCT 25.3* 26.6* 29.6*  --   PLT 273 314 332  --   LABPROT  --  13.3  --   --   INR  --  1.01  --   --   HEPARINUNFRC 0.41 0.27* 0.41 0.25*  CREATININE  --  0.84  --   --      Estimated Creatinine Clearance: 65.9 mL/min (by C-G formula based on SCr of 0.84 mg/dL).   Medications:  Infusions:  . heparin 1,000 Units/hr (07/24/16 [id number])    Assessment: 82 yoF with PMHx polymyalgia rheumatica, chronic pain, IBS, diverticulitis, afib with SVT and PVCs, HTN/HLD, anemia, COPD and obesity presents with right sided abdominal pain.  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  Pharmacy consulted to start IV heparin.  Discussed case with PA as pt has positive FOB and hemoglobin has dropped 10.2 >> 8.5 in the last two weeks.  Will begin very conservatively with heparin and omit bolus.    Patient was on xarelto but stopped d/t GI intolerance, Cardiology started her on Eliquis but unable to afford. Patient states she has not been on anticoagulation for ~ 1 week.   Today, 07/24/2016:  Heparin level subtherapeutic on 1000 units/hr this afternoon.   CBC: Hgb low/stable, pltc WNL  FOBT was positive but per GI no acute or subacute bleed likely; GI signed off as no interventions warranted other than blood/meds   No obvious bleeding problems  Per CM note, $0 copay for LMWH  Goal of Therapy:  Heparin level 0.3-0.7 units/ml Monitor platelets  by anticoagulation protocol: Yes   Plan:   Lovenox 100mg  (1mg /kg) SQ q12h  CBC q72h while on LMWH in hospital  Monitor for bleeding  Doreene Eland, PharmD, BCPS.   Pager: [phone] 07/24/2016 2:18 PM

## 2016-07-25 ENCOUNTER — Inpatient Hospital Stay (HOSPITAL_COMMUNITY): Payer: 59

## 2016-07-25 DIAGNOSIS — G459 Transient cerebral ischemic attack, unspecified: Secondary | ICD-10-CM

## 2016-07-25 LAB — CBC
HCT: 25.9 % — ABNORMAL LOW (ref 36.0–46.0)
Hemoglobin: 7.9 g/dL — ABNORMAL LOW (ref 12.0–15.0)
MCH: 27.7 pg (ref 26.0–34.0)
MCHC: 30.5 g/dL (ref 30.0–36.0)
MCV: 90.9 fL (ref 78.0–100.0)
PLATELETS: 276 10*3/uL (ref 150–400)
RBC: 2.85 MIL/uL — ABNORMAL LOW (ref 3.87–5.11)
RDW: 17.9 % — AB (ref 11.5–15.5)
WBC: 5.7 10*3/uL (ref 4.0–10.5)

## 2016-07-25 NOTE — Progress Notes (Signed)
PROGRESS NOTE                                                                                                                                                                                                             Patient Demographics:    [name], is a 80 y.o. female, DOB - 12-29-1935, [id number]  Admit date - 07/19/2016   Admitting Physician Velvet Bathe, MD  Outpatient Primary MD for the patient is [name], MD  LOS - 6  Outpatient Specialists: Dr Irene Limbo  Chief Complaint  Patient presents with  . Abdominal Pain       Brief Narrative   80 year old female with history of A. fib on anticoagulation, polymyalgia rheumatica on low-dose prednisone, IBS, COPD, MGUS (follows with Dr. Irene Limbo) presented with abdominal discomfort for last few days. She has also had almost 20 pound weight loss in the past 3 months. In the ED CT of the abdomen and pelvis showed a large liver lesion with extensive periportal adenopathy. Also showed gallbladder with sludge with questionable mass within the gallbladder and thickened wall. A CT angiogram of the chest done was positive for acute PE in the right middle and lower lobes. (Also showed a 1 cm left lower lobe pulmonary nodule).    Subjective:   Pain better controlled after pain medications adjusted yesterday.   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 -CT-guided liver biopsy done on 12/1. Results pending. -AFP negative and CEA markedly elevated (146). Appreciate oncology consult. Recommends if this is a gallbladder malignancy by pathology and would be palliative.    Pulmonary embolism without acute cor pulmonale  ([place])  Patient was on Xarelto at home but due to cost issues has not been able to take them. Able to afford lovenox, so transitioned to it.  Hemoccult-positive. H&H Remains stable. -appreciated GI eval. No intervention needed.   Active Problems:   Persistent abdominal pain Palliative care consult appreciated for pain management. Switched MS Contin to every 8 hours, continue oral oxycodone for breakthrough pain and IV Dilaudid. Added bowel regimen. Symptoms better on current regimen and will not escalate pain medications for  now. If biopsy turns out to be a gallbladder malignancy palliative care would be involved for goals of care discussion.  Anemia of chronic disease  Hemoccult positive. No need for colonoscopy at present per GI. H&H stable. Continue antiviral relation.  Hypothyroidism Continue Synthroid.  Chronic A. fib Rate controlled with metoprolol. On therapeutic Lovenox.    PMR (polymyalgia rheumatica) (HCC) Continue low-dose prednisone. Adjusted home MS Contin.  Transaminitis LFTs trending up possibly due to liver metastases. Monitor in a.m.   Code Status : Full code  Family Communication  : none at bedside today  Disposition Plan  : Home once abdominal pain better controlled and pending liver biopsy results. Possibly in the next 48 hours.  Barriers For Discharge : Active pain symptoms  Consults  :  [facility] (Dr. Irene Limbo) Palliative care  Procedures  :  CT abdomen, chest with PE protocol MRI abdomen Liver biopsy  DVT Prophylaxis  : Subcutaneous (therapeutic) Lovenox  Lab Results  Component Value Date   PLT 276 07/25/2016    Antibiotics  :    Anti-infectives    None        Objective:   Vitals:   07/24/16 0902 07/24/16 1350 07/24/16 2200 07/25/16 0546  BP: (!) 97/53 (!) 95/47 (!) 112/52 (!) 107/58  Pulse: 61 (!) 57 68 72  Resp:  16 18 16   Temp:  98.2 F (36.8 C) 98.6 F (37 C) 98.4 F (36.9 C)  TempSrc:  Oral Oral Oral  SpO2:   95% 95% 92%  Weight:      Height:        Wt Readings from Last 3 Encounters:  07/19/16 96.6 kg (213 lb)  07/12/16 97.5 kg (215 lb)  07/04/16 98 kg (216 lb)     Intake/Output Summary (Last 24 hours) at 07/25/16 0952 Last data filed at 07/25/16 0600  Gross per 24 hour  Intake              300 ml  Output                4 ml  Net              296 ml     Physical Exam  Gen: Appears fatigued HEENT: moist mucosa, supple neck Chest: clear b/l, no added sounds CVS: N S1&S2, no murmurs,  GI: soft, nondistended, bowel sounds present, right upper quadrant tenderness Improved Musculoskeletal: warm, no edema     Data Review:    CBC  Recent Labs Lab 07/19/16 0705  07/21/16 0530 07/22/16 0521 07/23/16 0439 07/24/16 0527 07/25/16 0519  WBC 7.1  < > 7.4 6.1 6.9 9.4 5.7  HGB 8.5*  < > 8.0* 7.9* 8.2* 8.9* 7.9*  HCT 26.5*  < > 25.4* 25.3* 26.6* 29.6* 25.9*  PLT 261  < > 286 273 314 332 276  MCV 87.2  < > 89.4 90.4 90.8 90.5 90.9  MCH 28.0  < > 28.2 28.2 28.0 27.2 27.7  MCHC 32.1  < > 31.5 31.2 30.8 30.1 30.5  RDW 17.7*  < > 18.2* 18.1* 18.0* 17.9* 17.9*  LYMPHSABS 1.3  --   --   --  2.2  --   --   MONOABS 0.6  --   --   --  0.6  --   --   EOSABS 0.1  --   --   --  0.1  --   --   BASOSABS 0.0  --   --   --  0.0  --   --   < > = values in this interval not displayed.  Chemistries   Recent Labs Lab 07/19/16 0705 07/20/16 0058 07/21/16 0530 07/23/16 0439 07/24/16 0527  NA 135 133* 135 136  --   K 4.0 3.9 3.8 3.8  --   CL 102 101 102 101  --   CO2 25 28 27 27   --   GLUCOSE 124* 109* 98 106*  --   BUN 16 12 11 15   --   CREATININE 0.77 0.87 0.73 0.84  --   CALCIUM 9.6 8.9 8.7* 8.8*  --   AST 21  --   --  155* 80*  ALT 13*  --   --  89* 69*  ALKPHOS 98  --   --  151* 159*  BILITOT 0.7  --   --  0.3 1.0   ------------------------------------------------------------------------------------------------------------------ No results for input(s): CHOL, HDL, LDLCALC,  TRIG, CHOLHDL, LDLDIRECT in the last 72 hours.  Lab Results  Component Value Date   HGBA1C 5.7 (H) 06/17/2016   ------------------------------------------------------------------------------------------------------------------ No results for input(s): TSH, T4TOTAL, T3FREE, THYROIDAB in the last 72 hours.  Invalid input(s): FREET3 ------------------------------------------------------------------------------------------------------------------ No results for input(s): VITAMINB12, FOLATE, FERRITIN, TIBC, IRON, RETICCTPCT in the last 72 hours.  Coagulation profile  Recent Labs Lab 07/19/16 1516 07/23/16 0439  INR 1.05 1.01    No results for input(s): DDIMER in the last 72 hours.  Cardiac Enzymes No results for input(s): CKMB, TROPONINI, MYOGLOBIN in the last 168 hours.  Invalid input(s): CK ------------------------------------------------------------------------------------------------------------------    Component Value Date/Time   BNP 197.8 (H) 07/04/2016 0056   BNP 130.2 (H) 09/22/2015 [id number]    Inpatient Medications  Scheduled Meds: . enoxaparin (LOVENOX) injection  100 mg Subcutaneous Q12H  . levothyroxine  88 mcg Oral QAC breakfast  . metoprolol succinate  150 mg Oral Daily  . morphine  30 mg Oral Q8H  . pantoprazole  40 mg Oral BID  . predniSONE  1 mg Oral Q breakfast  . sertraline  50 mg Oral Daily  . sodium chloride flush  3 mL Intravenous Q12H  . sodium chloride flush  3 mL Intravenous Q12H   Continuous Infusions:  PRN Meds:.sodium chloride, HYDROmorphone (DILAUDID) injection, meclizine, ondansetron (ZOFRAN) IV, oxyCODONE, senna, sodium chloride flush  Micro Results No results found for this or any previous visit (from the past 240 hour(s)).  Radiology Reports Ct Angio Chest Pe W And/or Wo Contrast  Result Date: 07/19/2016 CLINICAL DATA:  Abnormal CT abdomen and pelvis.  Abdominal pain. EXAM: CT ANGIOGRAPHY CHEST WITH CONTRAST TECHNIQUE: Multidetector  CT imaging of the chest was performed using the standard protocol during bolus administration of intravenous contrast. Multiplanar CT image reconstructions and MIPs were obtained to evaluate the vascular anatomy. CONTRAST:  100 cc Isovue 370 COMPARISON:  07/04/2016 FINDINGS: Cardiovascular: There is low-density filling defect within the right pulmonary artery segmental branch of the lateral basal segment of the right lower lobe. It extends into subsegmental branches. This is compatible with pulmonary thromboembolism. There is also segmental and subsegmental pulmonary thromboembolism at the base of the right middle lobe. There are no central filling defects in the pulmonary arterial tree. No evidence of left pulmonary thromboembolism. Coronary artery calcifications. Mediastinum/Nodes: No abnormal mediastinal adenopathy. Right infrahilar lymphoid tissue there is stable and mildly prominent. Lungs/Pleura: Advanced apical centrilobular emphysema. There is a questionable nodular density in the superior segment of the left lower lobe on image 42 of series 14, measuring 1.0 cm.  There is scattered volume loss in the lungs. Dependent atelectasis bilaterally. No pneumothorax. No pleural effusion. Upper Abdomen: Simple cysts in the left kidney. Musculoskeletal: No vertebral compression deformity. Mild scoliosis of the thoracic spine. Review of the MIP images confirms the above findings. IMPRESSION: The study is positive for acute pulmonary thromboembolism in the right middle and lower lobes. This affects segmental and subsegmental branches. Possible 1.0 cm left lower lobe pulmonary nodule. Electronically Signed   By: Marybelle Killings M.D.   On: 07/19/2016 13:36   Ct Angio Chest Pe W And/or Wo Contrast  Result Date: 07/04/2016 CLINICAL DATA:  Positive D-dimer. History of hypertension and arrhythmias. EXAM: CT ANGIOGRAPHY CHEST WITH CONTRAST TECHNIQUE: Multidetector CT imaging of the chest was performed using the standard  protocol during bolus administration of intravenous contrast. Multiplanar CT image reconstructions and MIPs were obtained to evaluate the vascular anatomy. CONTRAST:  100 mL Isovue 370 COMPARISON:  None. FINDINGS: Cardiovascular: Examination of the pulmonary arteries is somewhat limited by contrast bolus and motion artifact but there is no definite evidence of significant pulmonary embolus. Normal heart size. No pericardial effusion. Calcification of the aorta and coronary arteries. Mediastinum/Nodes: No enlarged mediastinal, hilar, or axillary lymph nodes. Thyroid gland, trachea, and esophagus demonstrate no significant findings. Lungs/Pleura: Atelectasis in the lung bases. Emphysematous changes particularly in the upper lungs. Slight fibrosis. No focal consolidation. No pleural effusions. No pneumothorax. Upper Abdomen: The appearance of the liver suggests hepatic cirrhosis. The lateral segment of the left lobe is enlarged and there appears to be mild lobulation to the contour. The gallbladder is distended and filled with mildly hyperechoic material, likely representing sludge although small stones or milk of calcium could also have this appearance. No gallbladder wall thickening. No bile duct dilatation. Musculoskeletal: No chest wall abnormality. No acute or significant osseous findings. Review of the MIP images confirms the above findings. IMPRESSION: No evidence of significant pulmonary embolus. Emphysematous changes, fibrosis, and atelectasis in the lungs. Aortic atherosclerosis. Electronically Signed   By: Lucienne Capers M.D.   On: 07/04/2016 05:18   Mr Abdomen W Wo Contrast  Result Date: 07/21/2016 CLINICAL DATA:  Followup abnormal CT scan of the abdomen demonstrating a gallbladder mass and hepatic metastatic disease. EXAM: MRI ABDOMEN WITHOUT AND WITH CONTRAST TECHNIQUE: Multiplanar multisequence MR imaging of the abdomen was performed both before and after the administration of intravenous  contrast. CONTRAST:  66mL MULTIHANCE GADOBENATE DIMEGLUMINE 529 MG/ML IV SOLN COMPARISON:  CT scan 07/19/2016 FINDINGS: Lower chest: The lung bases are grossly clear. No pleural or pericardial effusion. Hepatobiliary: Numerous hepatic lesions are again demonstrated. The largest lesion is in segment 5 and measures 49 mm. There are 3 lesions and segment 4B the largest 2 lesions measure 26 mm and a smaller lesion measures 12 mm. These are all diffusion positive consistent with neoplasm. Enhancing tumor in the fundal region of the gallbladder is also diffusion positive and consistent with gallbladder cancer with hepatic metastatic disease. There are also enlarged periportal and celiac axis lymph nodes as demonstrated on the CT scan which are diffusion positive. Pancreas: No mass, inflammation or ductal dilatation. Peripancreatic adenopathy is again demonstrated. Spleen:  Normal size.  No focal lesions. Adrenals/Urinary Tract: The adrenal glands and kidneys are unremarkable. Stomach/Bowel: The stomach, duodenum, visualized small bowel and visualized colon are unremarkable. Vascular/Lymphatic: Advanced atherosclerotic calcifications involving the aorta and iliac arteries. No aneurysm. Enlarged metastatic periportal, celiac axis, peripancreatic and retroperitoneal lymph nodes. Other:  No ascites or abdominal wall hernia. Musculoskeletal: No  significant bony findings IMPRESSION: 1. Enhancing and diffusion positive tumor in the gallbladder fundus consistent with gallbladder carcinoma. 2. Hepatic metastatic disease and upper abdominal metastatic lymphadenopathy. Electronically Signed   By: Marijo Sanes M.D.   On: 07/21/2016 15:02   Ct Abdomen Pelvis W Contrast  Result Date: 07/19/2016 CLINICAL DATA:  Abdominal pain EXAM: CT ABDOMEN AND PELVIS WITH CONTRAST TECHNIQUE: Multidetector CT imaging of the abdomen and pelvis was performed using the standard protocol following bolus administration of intravenous contrast. Oral  contrast was also administered. CONTRAST:  179mL ISOVUE-300 IOPAMIDOL (ISOVUE-300) INJECTION 61% COMPARISON:  CT abdomen and pelvis July 24, 2014 FINDINGS: Lower chest: There is bibasilar lung atelectasis. Heart is mildly enlarged. There is a focal area of decreased attenuation in the region of the left ventricle seen on axial slice 1 series 2 measuring 1.5 x 1.4 cm. There is a questionable pulmonary embolus in a right lower lobe pulmonary artery seen on axial slice 1 series 2. Hepatobiliary: There is an infiltrative appearing mass in the anterior segment of the right lobe of the liver measuring 7.7 x 4.9 cm. No other focal liver lesion is evident. There is extensive sludge in the gallbladder. An underlying mass in the gallbladder cannot be excluded. The gallbladder wall appears rather prominent by CT. There is no appreciable biliary duct dilatation. Pancreas: No pancreatic mass or inflammatory focus evident. Spleen: No splenic lesions are evident. Adrenals/Urinary Tract: Adrenals appear unremarkable bilaterally. There are two cysts arising from the upper pole of the left kidney eccentrically measuring just over 1 cm in size each. No non cystic liver lesions are evident. There is no hydronephrosis on either side. There is fetal lobulation on each side, an anatomic variant. There is no appreciable renal or ureteral calculus on either side. Urinary bladder is midline with wall thickness within normal limits. Stomach/Bowel: There are scattered colonic diverticula, primarily in the sigmoid region without diverticulitis. There is fairly diffuse stool throughout much of the colon. There is a right-sided femoral hernia which contains small bowel but no bowel compromise. There is no bowel dilatation or air-fluid level suggesting bowel obstruction. No free air or portal venous air is evident. Vascular/Lymphatic: There is extensive atherosclerotic calcification in the aorta and major pelvic arterial vessels diffusely. No  aneurysms are evident. Major mesenteric arterial vessels appear patent with moderate atherosclerotic calcification noted, particularly at the origin of the left renal artery. There is periportal region adenopathy. The largest of the periportal lymph nodes measures 4.4 x 2.3 cm. There is no adenopathy distant to the periportal region. Reproductive: Uterus appears absent. No pelvic mass or pelvic fluid collection evident. Other: Appendix not appreciable. No periappendiceal region inflammation. No abscess or ascites evident in the abdomen or pelvis. Musculoskeletal: There is extensive degenerative change in the lumbar spine. There is mild anterolisthesis of L4 on L5 and mild anterolisthesis of L5 on S1, findings present previously. There are no blastic or lytic bone lesions. There is no intramuscular or abdominal wall lesion. IMPRESSION: Large liver lesion felt to represent neoplastic focus. There is extensive periportal adenopathy. The gallbladder contains sludge with questionable mass within the gallbladder. Gallbladder wall appears borderline thickened. These findings raise concern for possible carcinoma arising from the gallbladder with liver metastasis and periportal adenopathy. Primary liver carcinoma is a differential consideration. Given the proclivity of colon carcinoma to metastasize to the liver, it may be prudent to consider direct visualization of the colon in this regard. Questionable pulmonary embolus in a right lower lobe pulmonary artery  branch. This finding may warrant CT angiogram of the chest for further assessment. Questionable lesion in the left ventricle. This finding may warrant echocardiography to further assess. There is extensive aortic atherosclerosis as well as atherosclerosis throughout the major pelvic vessels. No aneurysm evident. Right-sided femoral hernia containing small bowel but no bowel compromise. Critical Value/emergent results were called by telephone at the time of  interpretation on 07/19/2016 at 10:09 am to [place], [place] , who verbally acknowledged these results. Electronically Signed   By: Lowella Grip III M.D.   On: 07/19/2016 10:10   US Biopsy  Result Date: 07/23/2016 INDICATION: GALLBLADDER MASS WITH HEPATIC LESIONS COMPATIBLE WITH METASTATIC DISEASE. EXAM: ULTRASOUND BIOPSY CORE LIVER MEDICATIONS: 1% LIDOCAINE LOCALLY ANESTHESIA/SEDATION: Moderate (conscious) sedation was employed during this procedure. A total of Versed 1.5 mg and Fentanyl 37.5 mcg was administered intravenously. Moderate Sedation Time: 7 minutes. The patient's level of consciousness and vital signs were monitored continuously by radiology nursing throughout the procedure under my direct supervision. FLUOROSCOPY TIME:  Fluoroscopy Time: NONE. COMPLICATIONS: COMPLICATIONS None immediate. PROCEDURE: Informed written consent was obtained from the patient after a thorough discussion of the procedural risks, benefits and alternatives. All questions were addressed. Maximal Sterile Barrier Technique was utilized including caps, mask, sterile gowns, sterile gloves, sterile drape, hand hygiene and skin antiseptic. A timeout was performed prior to the initiation of the procedure. Previous imaging reviewed. The right hepatic mass adjacent to the gallbladder was localized. Overlying skin marked. Under sterile conditions and local anesthesia, a 17 gauge 6.8 cm access needle was advanced percutaneously into the right hepatic mass. Needle position confirmed with ultrasound. Images obtained for documentation. 3 18 gauge core biopsies obtained. Needle removed. Postprocedure imaging demonstrates no hemorrhage or hematoma. Samples placed in formalin. Patient tolerated the biopsy well. IMPRESSION: Successful ultrasound right hepatic mass 18 gauge core biopsy Electronically Signed   By: Jerilynn Mages.  Shick M.D.   On: 07/23/2016 12:43   Dg Chest Port 1 View  Result Date: 07/04/2016 CLINICAL DATA:  Chest pain  tonight.  Bradycardia. EXAM: PORTABLE CHEST 1 VIEW COMPARISON:  06/17/2016 FINDINGS: Shallow inspiration. Linear atelectasis in the lung bases. No focal airspace disease or consolidation. No blunting of costophrenic angles. No pneumothorax. Calcification of the aorta. Degenerative changes in the spine and shoulders. IMPRESSION: Linear atelectasis in the lung bases.  No focal consolidation. Electronically Signed   By: Lucienne Capers M.D.   On: 07/04/2016 02:04    Time Spent in minutes  25   Louellen Molder M.D on 07/25/2016 at 9:52 AM  Between 7am to 7pm - Pager - [phone]  After 7pm go to [email or web address] - password [email or web address]  [facility] -  Office  [phone]

## 2016-07-25 NOTE — Consult Note (Signed)
Admission H&P    Chief Complaint: acute onset left-sided weakness.  HPI: [name] is an 80 y.o. female with a history of SVT, atrial fibrillation, polymyalgia rheumatica, hypertension, hyperlipidemia and diverticulosis admitted on 07/19/2016 for pulmonary embolus. Patient is currently on anticoagulation with Lovenox. Neurology consultation was obtained following acute onset of left-sided weakness. Patient was also noted to have reduced responsiveness and difficulty with speech output. CT scan of her head showed no acute intracranial abnormality.Strength of left extremities returned to baseline, as did her speech. Code stroke was initially activated and was subsequently canceled.  LSN: none o'clock p.m. On 07/25/2016 tPA Given: No: deficits rapidly resolved mRankin:  Past Medical History:  Diagnosis Date  . Chronic pain    Managed by Dr. Hardin Negus  . Diverticulitis   . Hyperlipidemia   . Hypertension   . IBS (irritable bowel syndrome)   . Palpitations   . Polymyalgia rheumatica ([place])   . PVC's (premature ventricular contractions)   . SVT (supraventricular tachycardia) ([place])   . Tobacco dependence     Past Surgical History:  Procedure Laterality Date  . ABDOMINAL HYSTERECTOMY Bilateral   . NASAL SINUS SURGERY    . REPLACEMENT TOTAL KNEE    . TONSILLECTOMY    . WRIST FRACTURE SURGERY      Family History  Problem Relation Age of Onset  . Diabetes Mother   . Heart attack Father   . Heart disease Sister    Social History:  reports that she quit smoking about 4 years ago. She smoked 0.50 packs per day. She has never used smokeless tobacco. She reports that she does not drink alcohol or use drugs.  Allergies:  Allergies  Allergen Reactions  . Amoxicillin-Pot Clavulanate Diarrhea    Has patient had a PCN reaction causing immediate rash, facial/tongue/throat swelling, SOB or lightheadedness with hypotension: No Has patient had a PCN reaction causing severe rash involving  mucus membranes or skin necrosis: No Has patient had a PCN reaction that required hospitalization No Has patient had a PCN reaction occurring within the last 10 years: Yes If all of the above answers are "NO", then may proceed with Cephalosporin use.   . Aspirin Nausea And Vomiting  . Atorvastatin     REACTION: feel weak  . Carisoprodol     REACTION: intolerant  . Codeine     REACTION: hallucinations  . Codeine Phosphate     REACTION: UNKNOWN  . Cortisone     REACTION: pt. reports allergy, reaction not known  . Duoderm Hydroactive     Skin irritation and pain     . Ezetimibe-Simvastatin     REACTION: feels weak  . Naproxen Sodium     REACTION: GI symptoms  . Omeprazole     REACTION: abd pain  . Quinapril Hcl     REACTION: reports allergy, reaction not known  . Rofecoxib     REACTION: reports allergy, reaction not known  . Silvadene [Silver Sulfadiazine]     Skin irritation and pain   . Xarelto [Rivaroxaban] Other (See Comments)    Pain     Facility-Administered Medications Prior to Admission  Medication Dose Route Frequency Provider Last Rate Last Dose  . apixaban (ELIQUIS) tablet 5 mg  5 mg Oral BID Cheryln Manly, NP       Medications Prior to Admission  Medication Sig Dispense Refill  . levothyroxine (SYNTHROID, LEVOTHROID) 88 MCG tablet Take 1 tablet (88 mcg total) by mouth daily. 30 tablet 1  .  meclizine (ANTIVERT) 25 MG tablet TAKE 1 TABLET (25 MG TOTAL) BY MOUTH 3 (THREE) TIMES DAILY AS NEEDED FOR DIZZINESS. 30 tablet 2  . metoprolol succinate (TOPROL-XL) 50 MG 24 hr tablet Take 3 tablets (150 mg total) by mouth daily. 90 tablet 0  . morphine (MS CONTIN) 15 MG 12 hr tablet Take 1 tablet by mouth daily with breakfast.     . morphine (MS CONTIN) 30 MG 12 hr tablet Take 1 tablet by mouth at bedtime.     . pantoprazole (PROTONIX) 40 MG tablet TAKE 1 TABLET (40 MG TOTAL) BY MOUTH DAILY. 90 tablet 3  . predniSONE (DELTASONE) 1 MG tablet TAKE 4 TABLETS BY MOUTH DAILLY  IN THE MORNING IN AUGUST, 3 DAILY SEPTEMBER, 2 DAILY OCTOBER (Patient taking differently: Take 1 tablet once daily.) 360 tablet 0  . sertraline (ZOLOFT) 50 MG tablet TAKE 1 TABLET BY MOUTH EVERY DAY (Patient taking differently: TAKE 1 TABLET BY MOUTH EVERY DAY as needed for depression) 90 tablet 3  . trolamine salicylate (ASPERCREME) 10 % cream Apply 1 application topically 2 (two) times daily as needed for muscle pain.      ROS: As per HPI otherwise 10 point review of systems negative.  Physical Examination: Blood pressure 133/78, pulse (!) 101, temperature 98.1 F (36.7 C), temperature source Oral, resp. rate 20, height 5\' 7"  (1.702 m), weight 96.6 kg (213 lb), SpO2 94 %.  HEENT-  Normocephalic, no lesions, without obvious abnormality.  Normal external eye and conjunctiva.  Normal TM's bilaterally.  Normal auditory canals and external ears. Normal external nose, mucus membranes and septum.  Normal pharynx. Neck supple with no masses, nodes, nodules or enlargement. Cardiovascular - regular rate and rhythm, S1, S2 normal, no murmur, click, rub or gallop Lungs - chest clear, no wheezing, rales, normal symmetric air entry Abdomen - soft, non-tender; bowel sounds normal; no masses,  no organomegaly Extremities - no joint deformities, effusion, or inflammation  Neurologic Examination: Mental Status: Slightly lethargic, oriented, no acute distress.  Speech slightly slurred without evidence of aphasia. Able to follow commands without difficulty. Cranial Nerves: II-Visual fields were normal. III/IV/VI-Pupils were equal and reacted normally to light. Extraocular movements were full and conjugate.  V/VII-no facial numbness and no facial weakness. VIII-normal. X-mild dysarthria, commensurate with . Motor: 5/5 bilaterally with normal tone and bulk Sensory: Normal throughout. Deep Tendon Reflexes: trace to 1+and symmetric. Plantars: mute bilaterally Carotid auscultation: Normal  Results for  orders placed or performed during the hospital encounter of 07/19/16 (from the past 48 hour(s))  Heparin level (unfractionated)     Status: None   Collection Time: 07/24/16  5:27 AM  Result Value Ref Range   Heparin Unfractionated 0.41 0.30 - 0.70 IU/mL    Comment:        IF HEPARIN RESULTS ARE BELOW EXPECTED VALUES, AND PATIENT DOSAGE HAS BEEN CONFIRMED, SUGGEST FOLLOW UP TESTING OF ANTITHROMBIN III LEVELS.   Hepatic function panel     Status: Abnormal   Collection Time: 07/24/16  5:27 AM  Result Value Ref Range   Total Protein 7.3 6.5 - 8.1 g/dL   Albumin 3.0 (L) 3.5 - 5.0 g/dL   AST 80 (H) 15 - 41 U/L   ALT 69 (H) 14 - 54 U/L   Alkaline Phosphatase 159 (H) 38 - 126 U/L   Total Bilirubin 1.0 0.3 - 1.2 mg/dL   Bilirubin, Direct 0.2 0.1 - 0.5 mg/dL   Indirect Bilirubin 0.8 0.3 - 0.9 mg/dL  CBC  Status: Abnormal   Collection Time: 07/24/16  5:27 AM  Result Value Ref Range   WBC 9.4 4.0 - 10.5 K/uL   RBC 3.27 (L) 3.87 - 5.11 MIL/uL   Hemoglobin 8.9 (L) 12.0 - 15.0 g/dL   HCT 29.6 (L) 36.0 - 46.0 %   MCV 90.5 78.0 - 100.0 fL   MCH 27.2 26.0 - 34.0 pg   MCHC 30.1 30.0 - 36.0 g/dL   RDW 17.9 (H) 11.5 - 15.5 %   Platelets 332 150 - 400 K/uL  Heparin level (unfractionated)     Status: Abnormal   Collection Time: 07/24/16  1:10 PM  Result Value Ref Range   Heparin Unfractionated 0.25 (L) 0.30 - 0.70 IU/mL    Comment:        IF HEPARIN RESULTS ARE BELOW EXPECTED VALUES, AND PATIENT DOSAGE HAS BEEN CONFIRMED, SUGGEST FOLLOW UP TESTING OF ANTITHROMBIN III LEVELS.   CBC     Status: Abnormal   Collection Time: 07/25/16  5:19 AM  Result Value Ref Range   WBC 5.7 4.0 - 10.5 K/uL   RBC 2.85 (L) 3.87 - 5.11 MIL/uL   Hemoglobin 7.9 (L) 12.0 - 15.0 g/dL   HCT 25.9 (L) 36.0 - 46.0 %   MCV 90.9 78.0 - 100.0 fL   MCH 27.7 26.0 - 34.0 pg   MCHC 30.5 30.0 - 36.0 g/dL   RDW 17.9 (H) 11.5 - 15.5 %   Platelets 276 150 - 400 K/uL   Ct Head Code Stroke Wo Contrast  Result Date:  07/25/2016 CLINICAL DATA:  Code stroke. Sudden onset left arm weakness with altered mental status. EXAM: CT HEAD WITHOUT CONTRAST TECHNIQUE: Contiguous axial images were obtained from the base of the skull through the vertex without intravenous contrast. COMPARISON:  02/19/2008 FINDINGS: Brain: No evidence of acute infarction, hemorrhage, hydrocephalus, extra-axial collection or mass lesion/mass effect. Mild chronic microvascular ischemic change seen in the cerebral white matter, most notable in the subcortical left frontal region. Vascular: Atherosclerotic calcification.  No hyperdense vessel. Skull: No acute or aggressive finding. Sinuses/Orbits: Negative Other: Text page with results were sent at the time of interpretation on 07/25/2016 at 8:33 pm to [facility]. Awaiting call back. ASPECTS [facility] Stroke Program Early CT Score) - Ganglionic level infarction (caudate, lentiform nuclei, internal capsule, insula, M1-M3 cortex): 7 - Supraganglionic infarction (M4-M6 cortex): 3 Total score (0-10 with 10 being normal): 10 IMPRESSION: 1. No acute finding.ASPECTS is 10. 2. Mild chronic microvascular disease. Electronically Signed   By: Monte Fantasia M.D.   On: 07/25/2016 20:36    Assessment: 80 y.o. female with multiple risk factors for stroke who likely has experienced a transient ischemic attack. However, an acute subcortical right cerebral infarction cannot be rule.  Stroke Risk Factors - hyperlipidemia and hypertension  Plan: 1. HgbA1c, fasting lipid panel 2. MRI, MRA  of the brain without contrast 3. PT consult, OT consult, Speech consult 4. Echocardiogram 5. Carotid dopplers 6. Prophylactic therapy-Anticoagulation: Lovenox 7. Risk factor modification 8. Telemetry monitoring  C.R. Nicole Kindred, MD [facility] [phone]  07/25/2016, 10:05 PM

## 2016-07-25 NOTE — Consult Note (Signed)
[facility] Nurse wound consult note Reason for Consult: right foot, 5th digit with necrosis Wound type: dry gangrene vs trauma Pressure Ulcer POA: No Measurement:Entire digit is black/purple.  No surrounding erythema, induration or warmth Wound bed:As described above Drainage (amount, consistency, odor) None Periwound:AS described above Dressing procedure/placement/frequency:I have provided Nursing with guidance via the Orders for twice daily painting of affected digit with a betadine swabstick for both its antimicrobial and astringent properties.  A dry dressing will protect and cover.  [facility] nursing team will not follow, but will remain available to this patient, the nursing and medical teams.  Please re-consult if needed. Thanks, Maudie Flakes, MSN, RN, [place], Arther Abbott  Pager# [phone]

## 2016-07-25 NOTE — Progress Notes (Signed)
No charge note  Chart reviewed Discussed with Dr Clementeen Graham  Patient is resting in bed,she is asleep, she does not arouse to gentle voice stimulation. She has just received pain medication Pain medication regimen noted, no changes for today  BP (!) 107/58 (BP Location: Right Wrist)   Pulse 72   Temp 98.4 F (36.9 C) (Oral)   Resp 16   Ht 5\' 7"  (1.702 m)   Wt 96.6 kg (213 lb)   SpO2 92%   BMI 33.36 kg/m   Labs noted.  Await biopsy results.   Will likely need trans dermal fentanyl for long acting pain medication on discharge.   Palliative medicine team will continue to follow.   Loistine Chance MD [facility] palliative medicine [phone]

## 2016-07-25 NOTE — Significant Event (Signed)
Rapid Response Event Note  Overview: Time Called: 1941 Arrival Time: 1948 Event Type: Neurologic  Initial Focused Assessment: On arrival to patient room. She was in bed with eyes closed. She was not responsive to voice but was responsive to touch. Pupil were 3, equal, round, but sluggish. She was oriented to self and place but unable to answer question about time and situation. She had a weak grip bilaterally, was able to move her right leg to command but could not move her left leg to command or hold against gravity. Lung clear but diminished bilaterally.  Interventions: Called code stroke at 1958, to rule out stroke, patient has a history of PE on this admission.  Plan of Care (if not transferred): EKG, and stat CT scan was obtained Event Summary: Name of Physician Notified: Chaney Malling, NP at 1950    at    Outcome: Other (Comment) (called code stroke at 57)  Event End Time: 2101 Neurologist want patient to remain in room.  [name], [name]

## 2016-07-26 ENCOUNTER — Inpatient Hospital Stay (HOSPITAL_COMMUNITY): Payer: 59

## 2016-07-26 ENCOUNTER — Other Ambulatory Visit (HOSPITAL_COMMUNITY): Payer: Self-pay | Admitting: Radiology

## 2016-07-26 DIAGNOSIS — M6289 Other specified disorders of muscle: Secondary | ICD-10-CM

## 2016-07-26 DIAGNOSIS — R531 Weakness: Secondary | ICD-10-CM

## 2016-07-26 DIAGNOSIS — G459 Transient cerebral ischemic attack, unspecified: Secondary | ICD-10-CM

## 2016-07-26 DIAGNOSIS — Z7189 Other specified counseling: Secondary | ICD-10-CM

## 2016-07-26 DIAGNOSIS — R2990 Unspecified symptoms and signs involving the nervous system: Secondary | ICD-10-CM

## 2016-07-26 LAB — COMPREHENSIVE METABOLIC PANEL
ALBUMIN: 2.4 g/dL — AB (ref 3.5–5.0)
ALK PHOS: 236 U/L — AB (ref 38–126)
ALT: 138 U/L — AB (ref 14–54)
ANION GAP: 7 (ref 5–15)
AST: 186 U/L — ABNORMAL HIGH (ref 15–41)
BILIRUBIN TOTAL: 1.1 mg/dL (ref 0.3–1.2)
BUN: 14 mg/dL (ref 6–20)
CALCIUM: 8.8 mg/dL — AB (ref 8.9–10.3)
CO2: 28 mmol/L (ref 22–32)
CREATININE: 0.59 mg/dL (ref 0.44–1.00)
Chloride: 99 mmol/L — ABNORMAL LOW (ref 101–111)
GFR calc non Af Amer: >60 mL/min (ref >60)
GLUCOSE: 98 mg/dL (ref 65–99)
Potassium: 4.2 mmol/L (ref 3.5–5.1)
Sodium: 134 mmol/L — ABNORMAL LOW (ref 135–145)
TOTAL PROTEIN: 5.9 g/dL — AB (ref 6.5–8.1)

## 2016-07-26 LAB — ECHOCARDIOGRAM COMPLETE
Height: 67 in
WEIGHTICAEL: 3408 [oz_av]

## 2016-07-26 MED ORDER — STROKE: EARLY STAGES OF RECOVERY BOOK: Freq: Once | Status: DC

## 2016-07-26 MED ORDER — GADOBENATE DIMEGLUMINE 529 MG/ML IV SOLN
20.0000 mL | Freq: Once | INTRAVENOUS | Status: AC | PRN
  Administered 2016-07-26: 20 mL via INTRAVENOUS

## 2016-07-26 MED ORDER — MENTHOL 3 MG MT LOZG
1.0000 | LOZENGE | OROMUCOSAL | Status: DC | PRN
  Filled 2016-07-26: qty 9

## 2016-07-26 NOTE — Progress Notes (Signed)
PT Cancellation Note  Patient Details Name: [name] MRN: [id number] DOB: October 26, 1935   Cancelled Treatment:    Reason Eval/Treat Not Completed: Fatigue/lethargy limiting ability to participate.  Pt reports she just got comfortable and requests to rest.  Pt aware PT will attempt to check back on pt tomorrow and assist with mobility.   Jene Oravec,KATHrine E 07/26/2016, 2:00 PM Carmelia Bake, PT, DPT 07/26/2016 Pager: [phone]

## 2016-07-26 NOTE — Progress Notes (Signed)
*  PRELIMINARY RESULTS* Vascular Ultrasound Carotid Duplex (Doppler) has been completed.  Preliminary findings: Bilateral: No significant (1-39%) ICA stenosis. Antegrade vertebral flow.   Landry Mellow, RDMS, RVT  07/26/2016, 1:13 PM

## 2016-07-26 NOTE — Progress Notes (Signed)
Daily Progress Note   Patient Name: [name]       Date: 07/26/2016 DOB: [birth year]  Age: 80 y.o. MRN#: [id number] Attending Physician: Louellen Molder, MD Primary Care Physician: [name], MD Admit Date: 07/19/2016  Reason for Consultation/Follow-up: Establishing goals of care  Subjective: Appears weak, events in past 24 hours noted, concern now for possible TIA.  Daughter Zigmund Daniel present at the bedside, see discussion below:   Length of Stay: 7  Current Medications: Scheduled Meds:  . enoxaparin (LOVENOX) injection  100 mg Subcutaneous Q12H  . levothyroxine  88 mcg Oral QAC breakfast  . metoprolol succinate  150 mg Oral Daily  . morphine  30 mg Oral Q8H  . pantoprazole  40 mg Oral BID  . predniSONE  1 mg Oral Q breakfast  . sertraline  50 mg Oral Daily  . sodium chloride flush  3 mL Intravenous Q12H  . sodium chloride flush  3 mL Intravenous Q12H    Continuous Infusions:   PRN Meds: sodium chloride, HYDROmorphone (DILAUDID) injection, meclizine, ondansetron (ZOFRAN) IV, oxyCODONE, senna, sodium chloride flush  Physical Exam         Awake, some what confused S1 S2  Clear Abdomen soft non tender Weakness generalized Opens eyes,does not engage or interact much Appears weak  Vital Signs: BP 103/65 (BP Location: Right Wrist)   Pulse 79   Temp 98.2 F (36.8 C) (Oral)   Resp 16   Ht 5\' 7"  (1.702 m)   Wt 96.6 kg (213 lb)   SpO2 100%   BMI 33.36 kg/m  SpO2: SpO2: 100 % O2 Device: O2 Device: Nasal Cannula O2 Flow Rate: O2 Flow Rate (L/min): 2 L/min  Intake/output summary:  Intake/Output Summary (Last 24 hours) at 07/26/16 1511 Last data filed at 07/26/16 0252  Gross per 24 hour  Intake                0 ml  Output              125 ml  Net             -125  ml   LBM: Last BM Date: 07/24/16 Baseline Weight: Weight: 96.6 kg (213 lb) Most recent weight: Weight: 96.6 kg (213 lb)       Palliative Assessment/Data:  Flowsheet Rows   Flowsheet Row Most Recent Value  Intake Tab  Referral Department  Hospitalist  Unit at Time of Referral  Med/Surg Unit  Palliative Care Primary Diagnosis  Cancer  Date Notified  07/24/16  Palliative Care Type  Return patient Palliative Care  Reason for referral  Pain, Non-pain Symptom, Clarify Goals of Care, Counsel Regarding Hospice  Date of Admission  07/19/16  Date first seen by Palliative Care  07/24/16  # of days Palliative referral response time  0 Day(s)  # of days IP prior to Palliative referral  5  Clinical Assessment  Palliative Performance Scale Score  30%  Pain Max last 24 hours  4  Pain Min Last 24 hours  3  Dyspnea Max Last 24 Hours  4  Dyspnea Min Last 24 hours  3  Nausea Max Last 24 Hours  3  Nausea Min Last 24 Hours  2  Psychosocial & Spiritual Assessment  Palliative Care Outcomes  Patient/Family meeting held?  Yes  Who was at the meeting?  daughter Zigmund Daniel   Palliative Care Outcomes  Clarified goals of care      Patient Active Problem List   Diagnosis Date Noted  . Acute left-sided weakness   . Encounter for palliative care   . Right upper quadrant abdominal pain   . Abdominal pain, right upper quadrant   . Pulmonary embolism without acute cor pulmonale ([place]) 07/21/2016  . Hypothyroidism 07/21/2016  . Gallbladder mass 07/21/2016  . Liver metastases ([place]) 07/21/2016  . Liver mass   . MGUS (monoclonal gammopathy of unknown significance) 11/18/2015  . PMR (polymyalgia rheumatica) ([place]) 05/15/2014  . Depression 04/19/2011  . Essential hypertension 12/27/2006    Palliative Care Assessment & Plan   Patient Profile:    Assessment:  TIA versus acute hypoxic encephalopathy Abdominal pain Possible gall bladder malignancy Possible abdominal metastatic disease burden in  liver LLL pulmonary nodule PE History of PMR: patient was on chronic opioids and also on PO Prednisone.     Recommendations/Plan:  Family meeting:  Gentle discussions regarding code status and goals of care undertaken with patient and daughter given her above mentioned conditions. Patient reportedly has a living will, daughter Zigmund Daniel wished to look for it at home to review the patient's living will. Patient is not able to completely participate in these discussions, she is some what confused. Additionally, biopsy results are pending. MRI brain inconclusive for whether or not there is metastatic involvement. Oncology is following, neurology notes reviewed and discussed with daughter Zigmund Daniel outside the room.   Plan for a family meeting with Biopsy results, upon daughter's request, some initial discussions regarding home with hospice versus SNF rehab with palliative support discussed in some detail. Palliative will continue to follow.   Code Status:    Code Status Orders        Start     Ordered   07/19/16 1724  Full code  Continuous     07/19/16 1723    Code Status History    Date Active Date Inactive Code Status Order ID Comments User Context   06/17/2016  8:11 AM 06/24/2016  5:52 PM Full Code [id number]  Samella Parr, NP ED       Prognosis:   guarded.   Discharge Planning:  To Be Determined  Care plan was discussed with  Patient, then also discussed with daughter Zigmund Daniel outside the room.   Thank you for allowing the Palliative Medicine Team to assist in the care of this  patient.   Time In:  1445 Time Out: 1515 Total Time  30 min  Prolonged Time Billed  no       Greater than 50%  of this time was spent counseling and coordinating care related to the above assessment and plan.  Loistine Chance, MD [phone]  Please contact Palliative Medicine Team phone at [phone] for questions and concerns.

## 2016-07-26 NOTE — Progress Notes (Signed)
PT Cancellation Note  Patient Details Name: [name] MRN: [id number] DOB: 04-07-1936   Cancelled Treatment:    Reason Eval/Treat Not Completed: Patient at procedure or test/unavailable (MRI) Will check back as schedule permits.   Darus Hershman,KATHrine E 07/26/2016, 10:59 AM Carmelia Bake, PT, DPT 07/26/2016 Pager: [phone]

## 2016-07-26 NOTE — Progress Notes (Signed)
PROGRESS NOTE                                                                                                                                                                                                             Patient Demographics:    [name], is a 80 y.o. female, DOB - August 24, 1935, [id number]  Admit date - 07/19/2016   Admitting Physician Velvet Bathe, MD  Outpatient Primary MD for the patient is [name], MD  LOS - 7  Outpatient Specialists: Dr Irene Limbo  Chief Complaint  Patient presents with  . Abdominal Pain       Brief Narrative   80 year old female with history of A. fib on anticoagulation, polymyalgia rheumatica on low-dose prednisone, IBS, COPD, MGUS (follows with Dr. Irene Limbo) presented with abdominal discomfort for last few days. She has also had almost 20 pound weight loss in the past 3 months. In the ED CT of the abdomen and pelvis showed a large liver lesion with extensive periportal adenopathy. Also showed gallbladder with sludge with questionable mass within the gallbladder and thickened wall. A CT angiogram of the chest done was positive for acute PE in the right middle and lower lobes. (Also showed a 1 cm left lower lobe pulmonary nodule).    Subjective:   Last night patient became acutely confused and poorly responsive. Code stroke was called and neurology consulted. Stat head CT was negative for stroke. This morning on exam patient does not remember what happened. Denies any weakness or numbness at present. Abdominal pain much better.   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 -CT-guided liver biopsy done on 12/1. Results  pending.(Hopefully should be available tomorrow). -AFP negative and CEA markedly elevated (146). Appreciate oncology consult. Recommends if this is a gallbladder malignancy by pathology and would be palliative.    Pulmonary embolism without acute cor pulmonale ([place])  Patient was on Xarelto at home but due to cost issues has not been able to take them. Able to afford lovenox, so transitioned to it.  Hemoccult-positive. H&H Remains stable. -appreciated GI eval. No intervention needed.   Active Problems: TIA versus acute  metabolic encephalopathy on 12/3. Code stroke called. Neurology consult appreciated. Stat head CT negative for infarct or bleeding. MRI brain negative for stroke but shows a possible subtle 3 mm rim-enhancing metastasis in the medial right thalamus. Carotid Doppler without significant stenosis. 2-D echo pending. No further neurological deficit. Will monitor for now. Neurology consult appreciated.   Persistent abdominal pain Palliative care consult appreciated for pain management. Switched MS Contin to every 8 hours, continue oral oxycodone for breakthrough pain and IV Dilaudid. Added bowel regimen. Symptoms better on current regimen and will not escalate pain medications for now. If biopsy turns out to be a gallbladder malignancy palliative care would be involved for goals of care discussion.  Anemia of chronic disease  Hemoccult positive. No need for colonoscopy at present per GI. H&H stable. Continue antiviral relation.  Hypothyroidism Continue Synthroid.  Chronic A. fib Rate controlled with metoprolol. On therapeutic Lovenox.    PMR (polymyalgia rheumatica) (HCC) Continue low-dose prednisone. Adjusted home MS Contin.  Transaminitis LFTs trending up possibly due to liver metastases.    Code Status : Full code  Family Communication  : At bedside  Disposition Plan  : Home possibly tomorrow after biopsy results obtained with further discussion on plan of  care.  Barriers For Discharge : Strokelike symptoms.  Consults  :  Systems analyst GI IR Oncology (Dr. Irene Limbo) Palliative care Neurology  Procedures  :  CT abdomen, chest with PE protocol MRI abdomen Liver biopsy  DVT Prophylaxis  : Subcutaneous (therapeutic) Lovenox  Lab Results  Component Value Date   PLT 276 07/25/2016    Antibiotics  :    Anti-infectives    None        Objective:   Vitals:   07/26/16 0208 07/26/16 0631 07/26/16 0930 07/26/16 1316  BP: 118/62 119/63 (!) 115/55 103/65  Pulse: 75 74 80 79  Resp: 14 16 16 16   Temp: 98.6 F (37 C) 98.1 F (36.7 C)  98.2 F (36.8 C)  TempSrc: Oral Oral  Oral  SpO2: 92% 94% 95% 100%  Weight:      Height:        Wt Readings from Last 3 Encounters:  07/19/16 96.6 kg (213 lb)  07/12/16 97.5 kg (215 lb)  07/04/16 98 kg (216 lb)     Intake/Output Summary (Last 24 hours) at 07/26/16 1932 Last data filed at 07/26/16 1540  Gross per 24 hour  Intake                0 ml  Output              126 ml  Net             -126 ml     Physical Exam  Gen: Appears fatigued HEENT: moist mucosa, supple neck Chest: clear b/l, no added sounds CVS: N S1&S2, no murmurs,  GI: soft, nondistended, bowel sounds present,No abdominal tenderness Musculoskeletal: warm, no edema CNS: Alert and oriented, nonfocal    Data Review:    CBC  Recent Labs Lab 07/21/16 0530 07/22/16 0521 07/23/16 0439 07/24/16 0527 07/25/16 0519  WBC 7.4 6.1 6.9 9.4 5.7  HGB 8.0* 7.9* 8.2* 8.9* 7.9*  HCT 25.4* 25.3* 26.6* 29.6* 25.9*  PLT 286 273 314 332 276  MCV 89.4 90.4 90.8 90.5 90.9  MCH 28.2 28.2 28.0 27.2 27.7  MCHC 31.5 31.2 30.8 30.1 30.5  RDW 18.2* 18.1* 18.0* 17.9* 17.9*  LYMPHSABS  --   --  2.2  --   --  MONOABS  --   --  0.6  --   --   EOSABS  --   --  0.1  --   --   BASOSABS  --   --  0.0  --   --     Chemistries   Recent Labs Lab 07/20/16 0058 07/21/16 0530 07/23/16 0439 07/24/16 0527 07/26/16 0440  NA 133* 135 136   --  134*  K 3.9 3.8 3.8  --  4.2  CL 101 102 101  --  99*  CO2 28 27 27   --  28  GLUCOSE 109* 98 106*  --  98  BUN 12 11 15   --  14  CREATININE 0.87 0.73 0.84  --  0.59  CALCIUM 8.9 8.7* 8.8*  --  8.8*  AST  --   --  155* 80* 186*  ALT  --   --  89* 69* 138*  ALKPHOS  --   --  151* 159* 236*  BILITOT  --   --  0.3 1.0 1.1   ------------------------------------------------------------------------------------------------------------------ No results for input(s): CHOL, HDL, LDLCALC, TRIG, CHOLHDL, LDLDIRECT in the last 72 hours.  Lab Results  Component Value Date   HGBA1C 5.7 (H) 06/17/2016   ------------------------------------------------------------------------------------------------------------------ No results for input(s): TSH, T4TOTAL, T3FREE, THYROIDAB in the last 72 hours.  Invalid input(s): FREET3 ------------------------------------------------------------------------------------------------------------------ No results for input(s): VITAMINB12, FOLATE, FERRITIN, TIBC, IRON, RETICCTPCT in the last 72 hours.  Coagulation profile  Recent Labs Lab 07/23/16 0439  INR 1.01    No results for input(s): DDIMER in the last 72 hours.  Cardiac Enzymes No results for input(s): CKMB, TROPONINI, MYOGLOBIN in the last 168 hours.  Invalid input(s): CK ------------------------------------------------------------------------------------------------------------------    Component Value Date/Time   BNP 197.8 (H) 07/04/2016 0056   BNP 130.2 (H) 09/22/2015 [id number]    Inpatient Medications  Scheduled Meds: . enoxaparin (LOVENOX) injection  100 mg Subcutaneous Q12H  . levothyroxine  88 mcg Oral QAC breakfast  . metoprolol succinate  150 mg Oral Daily  . morphine  30 mg Oral Q8H  . pantoprazole  40 mg Oral BID  . predniSONE  1 mg Oral Q breakfast  . sertraline  50 mg Oral Daily  . sodium chloride flush  3 mL Intravenous Q12H  . sodium chloride flush  3 mL Intravenous Q12H    Continuous Infusions:  PRN Meds:.sodium chloride, HYDROmorphone (DILAUDID) injection, meclizine, menthol-cetylpyridinium, ondansetron (ZOFRAN) IV, oxyCODONE, senna, sodium chloride flush  Micro Results No results found for this or any previous visit (from the past 240 hour(s)).  Radiology Reports Ct Angio Chest Pe W And/or Wo Contrast  Result Date: 07/19/2016 CLINICAL DATA:  Abnormal CT abdomen and pelvis.  Abdominal pain. EXAM: CT ANGIOGRAPHY CHEST WITH CONTRAST TECHNIQUE: Multidetector CT imaging of the chest was performed using the standard protocol during bolus administration of intravenous contrast. Multiplanar CT image reconstructions and MIPs were obtained to evaluate the vascular anatomy. CONTRAST:  100 cc Isovue 370 COMPARISON:  07/04/2016 FINDINGS: Cardiovascular: There is low-density filling defect within the right pulmonary artery segmental branch of the lateral basal segment of the right lower lobe. It extends into subsegmental branches. This is compatible with pulmonary thromboembolism. There is also segmental and subsegmental pulmonary thromboembolism at the base of the right middle lobe. There are no central filling defects in the pulmonary arterial tree. No evidence of left pulmonary thromboembolism. Coronary artery calcifications. Mediastinum/Nodes: No abnormal mediastinal adenopathy. Right infrahilar lymphoid tissue there is stable and mildly prominent. Lungs/Pleura: Advanced  apical centrilobular emphysema. There is a questionable nodular density in the superior segment of the left lower lobe on image 42 of series 14, measuring 1.0 cm. There is scattered volume loss in the lungs. Dependent atelectasis bilaterally. No pneumothorax. No pleural effusion. Upper Abdomen: Simple cysts in the left kidney. Musculoskeletal: No vertebral compression deformity. Mild scoliosis of the thoracic spine. Review of the MIP images confirms the above findings. IMPRESSION: The study is positive for  acute pulmonary thromboembolism in the right middle and lower lobes. This affects segmental and subsegmental branches. Possible 1.0 cm left lower lobe pulmonary nodule. Electronically Signed   By: Marybelle Killings M.D.   On: 07/19/2016 13:36   Ct Angio Chest Pe W And/or Wo Contrast  Result Date: 07/04/2016 CLINICAL DATA:  Positive D-dimer. History of hypertension and arrhythmias. EXAM: CT ANGIOGRAPHY CHEST WITH CONTRAST TECHNIQUE: Multidetector CT imaging of the chest was performed using the standard protocol during bolus administration of intravenous contrast. Multiplanar CT image reconstructions and MIPs were obtained to evaluate the vascular anatomy. CONTRAST:  100 mL Isovue 370 COMPARISON:  None. FINDINGS: Cardiovascular: Examination of the pulmonary arteries is somewhat limited by contrast bolus and motion artifact but there is no definite evidence of significant pulmonary embolus. Normal heart size. No pericardial effusion. Calcification of the aorta and coronary arteries. Mediastinum/Nodes: No enlarged mediastinal, hilar, or axillary lymph nodes. Thyroid gland, trachea, and esophagus demonstrate no significant findings. Lungs/Pleura: Atelectasis in the lung bases. Emphysematous changes particularly in the upper lungs. Slight fibrosis. No focal consolidation. No pleural effusions. No pneumothorax. Upper Abdomen: The appearance of the liver suggests hepatic cirrhosis. The lateral segment of the left lobe is enlarged and there appears to be mild lobulation to the contour. The gallbladder is distended and filled with mildly hyperechoic material, likely representing sludge although small stones or milk of calcium could also have this appearance. No gallbladder wall thickening. No bile duct dilatation. Musculoskeletal: No chest wall abnormality. No acute or significant osseous findings. Review of the MIP images confirms the above findings. IMPRESSION: No evidence of significant pulmonary embolus. Emphysematous  changes, fibrosis, and atelectasis in the lungs. Aortic atherosclerosis. Electronically Signed   By: Lucienne Capers M.D.   On: 07/04/2016 05:18   Mr Jeri Cos [id number] Contrast  Result Date: 07/26/2016 CLINICAL DATA:  80 year old female with new onset confusion. Liver mass. Possible gallbladder mass. Query metastatic disease. Initial encounter. EXAM: MRI HEAD WITHOUT AND WITH CONTRAST MRA HEAD WITHOUT CONTRAST TECHNIQUE: Multiplanar, multiecho pulse sequences of the brain and surrounding structures were obtained without and with intravenous contrast. Angiographic images of the head were obtained using MRA technique without contrast. CONTRAST:  57mL MULTIHANCE GADOBENATE DIMEGLUMINE 529 MG/ML IV SOLN COMPARISON:  Head CT without contrast 02/19/2008. FINDINGS: MRI HEAD FINDINGS Study is intermittently degraded by motion artifact despite repeated imaging attempts. Brain: No restricted diffusion to suggest acute infarction. No midline shift, mass effect, evidence of mass lesion, ventriculomegaly, extra-axial collection or acute intracranial hemorrhage. Cervicomedullary junction and pituitary are within normal limits. Motion artifact on post-contrast images of the brain. Difficult to exclude a 3-4 mm rim enhancing lesion along the medial right thalamus (series 12, image 27), although this might be artifact. There is no thalamic edema or mass effect although there might be mild T2 and FLAIR hyperintensity in the area of this lesion (series 7, image 12). No other convincing abnormal intracranial enhancement. No dural thickening identified. Scattered patchy bilateral cerebral white matter T2 and FLAIR hyperintensity, nonspecific and moderate for age. Similar  patchy T2 hyperintensity in the pons. No cortical encephalomalacia or chronic cerebral blood products. Negative cerebellum. Vascular: Major intracranial vascular flow voids are preserved, dominant distal left vertebral artery and incidental pneumatized left anterior  clinoid process. Skull and upper cervical spine: Normal visible bone marrow signal. Grossly negative visualized cervical spine. Sinuses/Orbits: Negative orbits soft tissues. Visualized paranasal sinuses and mastoids are well pneumatized. Other: Visible internal auditory structures appear normal. Negative scalp soft tissues. MRA HEAD FINDINGS Vessel detail degraded by motion. Antegrade flow in the posterior circulation with dominant and dolichoectatic distal left vertebral artery. The distal right vertebral artery appears patent. Both PICA origins appear normal. Patent basilar artery without stenosis. Right posterior communicating artery is present while the left is diminutive or absent. SCA and PCA origins and bilateral PCA branches appear within normal limits. Antegrade flow in both ICA siphons. Mild ICA dolichoectasia. No ICA stenosis. Ophthalmic and right posterior communicating artery origins appear normal. Patent carotid termini. MCA and ACA origins appear within normal limits. The left A1 segment is dominant. Anterior communicating artery and visualized ACA branches are within normal limits. Bilateral MCA M1 segments and MCA bifurcations are patent. Visible bilateral MCA branches are within normal limits. IMPRESSION: 1. Post-contrast images are degraded by motion. A subtle 3 mm rim-enhancing metastasis in the medial right thalamus is difficult to exclude but is favored to be artifactual. A repeat Brain MRI without and with contrast - done as an outpatient - to confirm and re-stage is recommended. 2. Otherwise no metastatic disease or acute intracranial abnormality. 3. Negative intracranial MRA aside from intracranial artery dolichoectasia. Electronically Signed   By: Genevie Ann M.D.   On: 07/26/2016 11:58   Mr Abdomen W Wo Contrast  Result Date: 07/21/2016 CLINICAL DATA:  Followup abnormal CT scan of the abdomen demonstrating a gallbladder mass and hepatic metastatic disease. EXAM: MRI ABDOMEN WITHOUT AND  WITH CONTRAST TECHNIQUE: Multiplanar multisequence MR imaging of the abdomen was performed both before and after the administration of intravenous contrast. CONTRAST:  90mL MULTIHANCE GADOBENATE DIMEGLUMINE 529 MG/ML IV SOLN COMPARISON:  CT scan 07/19/2016 FINDINGS: Lower chest: The lung bases are grossly clear. No pleural or pericardial effusion. Hepatobiliary: Numerous hepatic lesions are again demonstrated. The largest lesion is in segment 5 and measures 49 mm. There are 3 lesions and segment 4B the largest 2 lesions measure 26 mm and a smaller lesion measures 12 mm. These are all diffusion positive consistent with neoplasm. Enhancing tumor in the fundal region of the gallbladder is also diffusion positive and consistent with gallbladder cancer with hepatic metastatic disease. There are also enlarged periportal and celiac axis lymph nodes as demonstrated on the CT scan which are diffusion positive. Pancreas: No mass, inflammation or ductal dilatation. Peripancreatic adenopathy is again demonstrated. Spleen:  Normal size.  No focal lesions. Adrenals/Urinary Tract: The adrenal glands and kidneys are unremarkable. Stomach/Bowel: The stomach, duodenum, visualized small bowel and visualized colon are unremarkable. Vascular/Lymphatic: Advanced atherosclerotic calcifications involving the aorta and iliac arteries. No aneurysm. Enlarged metastatic periportal, celiac axis, peripancreatic and retroperitoneal lymph nodes. Other:  No ascites or abdominal wall hernia. Musculoskeletal: No significant bony findings IMPRESSION: 1. Enhancing and diffusion positive tumor in the gallbladder fundus consistent with gallbladder carcinoma. 2. Hepatic metastatic disease and upper abdominal metastatic lymphadenopathy. Electronically Signed   By: Marijo Sanes M.D.   On: 07/21/2016 15:02   Ct Abdomen Pelvis W Contrast  Result Date: 07/19/2016 CLINICAL DATA:  Abdominal pain EXAM: CT ABDOMEN AND PELVIS WITH CONTRAST TECHNIQUE:  Multidetector CT imaging of the abdomen and pelvis was performed using the standard protocol following bolus administration of intravenous contrast. Oral contrast was also administered. CONTRAST:  156mL ISOVUE-300 IOPAMIDOL (ISOVUE-300) INJECTION 61% COMPARISON:  CT abdomen and pelvis July 24, 2014 FINDINGS: Lower chest: There is bibasilar lung atelectasis. Heart is mildly enlarged. There is a focal area of decreased attenuation in the region of the left ventricle seen on axial slice 1 series 2 measuring 1.5 x 1.4 cm. There is a questionable pulmonary embolus in a right lower lobe pulmonary artery seen on axial slice 1 series 2. Hepatobiliary: There is an infiltrative appearing mass in the anterior segment of the right lobe of the liver measuring 7.7 x 4.9 cm. No other focal liver lesion is evident. There is extensive sludge in the gallbladder. An underlying mass in the gallbladder cannot be excluded. The gallbladder wall appears rather prominent by CT. There is no appreciable biliary duct dilatation. Pancreas: No pancreatic mass or inflammatory focus evident. Spleen: No splenic lesions are evident. Adrenals/Urinary Tract: Adrenals appear unremarkable bilaterally. There are two cysts arising from the upper pole of the left kidney eccentrically measuring just over 1 cm in size each. No non cystic liver lesions are evident. There is no hydronephrosis on either side. There is fetal lobulation on each side, an anatomic variant. There is no appreciable renal or ureteral calculus on either side. Urinary bladder is midline with wall thickness within normal limits. Stomach/Bowel: There are scattered colonic diverticula, primarily in the sigmoid region without diverticulitis. There is fairly diffuse stool throughout much of the colon. There is a right-sided femoral hernia which contains small bowel but no bowel compromise. There is no bowel dilatation or air-fluid level suggesting bowel obstruction. No free air or portal  venous air is evident. Vascular/Lymphatic: There is extensive atherosclerotic calcification in the aorta and major pelvic arterial vessels diffusely. No aneurysms are evident. Major mesenteric arterial vessels appear patent with moderate atherosclerotic calcification noted, particularly at the origin of the left renal artery. There is periportal region adenopathy. The largest of the periportal lymph nodes measures 4.4 x 2.3 cm. There is no adenopathy distant to the periportal region. Reproductive: Uterus appears absent. No pelvic mass or pelvic fluid collection evident. Other: Appendix not appreciable. No periappendiceal region inflammation. No abscess or ascites evident in the abdomen or pelvis. Musculoskeletal: There is extensive degenerative change in the lumbar spine. There is mild anterolisthesis of L4 on L5 and mild anterolisthesis of L5 on S1, findings present previously. There are no blastic or lytic bone lesions. There is no intramuscular or abdominal wall lesion. IMPRESSION: Large liver lesion felt to represent neoplastic focus. There is extensive periportal adenopathy. The gallbladder contains sludge with questionable mass within the gallbladder. Gallbladder wall appears borderline thickened. These findings raise concern for possible carcinoma arising from the gallbladder with liver metastasis and periportal adenopathy. Primary liver carcinoma is a differential consideration. Given the proclivity of colon carcinoma to metastasize to the liver, it may be prudent to consider direct visualization of the colon in this regard. Questionable pulmonary embolus in a right lower lobe pulmonary artery branch. This finding may warrant CT angiogram of the chest for further assessment. Questionable lesion in the left ventricle. This finding may warrant echocardiography to further assess. There is extensive aortic atherosclerosis as well as atherosclerosis throughout the major pelvic vessels. No aneurysm evident.  Right-sided femoral hernia containing small bowel but no bowel compromise. Critical Value/emergent results were called by telephone at the time of  interpretation on 07/19/2016 at 10:09 am to [place] II, PA , who verbally acknowledged these results. Electronically Signed   By: Lowella Grip III M.D.   On: 07/19/2016 10:10   US Biopsy  Result Date: 07/23/2016 INDICATION: GALLBLADDER MASS WITH HEPATIC LESIONS COMPATIBLE WITH METASTATIC DISEASE. EXAM: ULTRASOUND BIOPSY CORE LIVER MEDICATIONS: 1% LIDOCAINE LOCALLY ANESTHESIA/SEDATION: Moderate (conscious) sedation was employed during this procedure. A total of Versed 1.5 mg and Fentanyl 37.5 mcg was administered intravenously. Moderate Sedation Time: 7 minutes. The patient's level of consciousness and vital signs were monitored continuously by radiology nursing throughout the procedure under my direct supervision. FLUOROSCOPY TIME:  Fluoroscopy Time: NONE. COMPLICATIONS: COMPLICATIONS None immediate. PROCEDURE: Informed written consent was obtained from the patient after a thorough discussion of the procedural risks, benefits and alternatives. All questions were addressed. Maximal Sterile Barrier Technique was utilized including caps, mask, sterile gowns, sterile gloves, sterile drape, hand hygiene and skin antiseptic. A timeout was performed prior to the initiation of the procedure. Previous imaging reviewed. The right hepatic mass adjacent to the gallbladder was localized. Overlying skin marked. Under sterile conditions and local anesthesia, a 17 gauge 6.8 cm access needle was advanced percutaneously into the right hepatic mass. Needle position confirmed with ultrasound. Images obtained for documentation. 3 18 gauge core biopsies obtained. Needle removed. Postprocedure imaging demonstrates no hemorrhage or hematoma. Samples placed in formalin. Patient tolerated the biopsy well. IMPRESSION: Successful ultrasound right hepatic mass 18 gauge core biopsy  Electronically Signed   By: Jerilynn Mages.  Shick M.D.   On: 07/23/2016 12:43   Dg Chest Port 1 View  Result Date: 07/04/2016 CLINICAL DATA:  Chest pain tonight.  Bradycardia. EXAM: PORTABLE CHEST 1 VIEW COMPARISON:  06/17/2016 FINDINGS: Shallow inspiration. Linear atelectasis in the lung bases. No focal airspace disease or consolidation. No blunting of costophrenic angles. No pneumothorax. Calcification of the aorta. Degenerative changes in the spine and shoulders. IMPRESSION: Linear atelectasis in the lung bases.  No focal consolidation. Electronically Signed   By: Lucienne Capers M.D.   On: 07/04/2016 02:04   Mr Jodene Nam Headm  Result Date: 07/26/2016 CLINICAL DATA:  80 year old female with new onset confusion. Liver mass. Possible gallbladder mass. Query metastatic disease. Initial encounter. EXAM: MRI HEAD WITHOUT AND WITH CONTRAST MRA HEAD WITHOUT CONTRAST TECHNIQUE: Multiplanar, multiecho pulse sequences of the brain and surrounding structures were obtained without and with intravenous contrast. Angiographic images of the head were obtained using MRA technique without contrast. CONTRAST:  46mL MULTIHANCE GADOBENATE DIMEGLUMINE 529 MG/ML IV SOLN COMPARISON:  Head CT without contrast 02/19/2008. FINDINGS: MRI HEAD FINDINGS Study is intermittently degraded by motion artifact despite repeated imaging attempts. Brain: No restricted diffusion to suggest acute infarction. No midline shift, mass effect, evidence of mass lesion, ventriculomegaly, extra-axial collection or acute intracranial hemorrhage. Cervicomedullary junction and pituitary are within normal limits. Motion artifact on post-contrast images of the brain. Difficult to exclude a 3-4 mm rim enhancing lesion along the medial right thalamus (series 12, image 27), although this might be artifact. There is no thalamic edema or mass effect although there might be mild T2 and FLAIR hyperintensity in the area of this lesion (series 7, image 12). No other convincing  abnormal intracranial enhancement. No dural thickening identified. Scattered patchy bilateral cerebral white matter T2 and FLAIR hyperintensity, nonspecific and moderate for age. Similar patchy T2 hyperintensity in the pons. No cortical encephalomalacia or chronic cerebral blood products. Negative cerebellum. Vascular: Major intracranial vascular flow voids are preserved, dominant distal left vertebral artery  and incidental pneumatized left anterior clinoid process. Skull and upper cervical spine: Normal visible bone marrow signal. Grossly negative visualized cervical spine. Sinuses/Orbits: Negative orbits soft tissues. Visualized paranasal sinuses and mastoids are well pneumatized. Other: Visible internal auditory structures appear normal. Negative scalp soft tissues. MRA HEAD FINDINGS Vessel detail degraded by motion. Antegrade flow in the posterior circulation with dominant and dolichoectatic distal left vertebral artery. The distal right vertebral artery appears patent. Both PICA origins appear normal. Patent basilar artery without stenosis. Right posterior communicating artery is present while the left is diminutive or absent. SCA and PCA origins and bilateral PCA branches appear within normal limits. Antegrade flow in both ICA siphons. Mild ICA dolichoectasia. No ICA stenosis. Ophthalmic and right posterior communicating artery origins appear normal. Patent carotid termini. MCA and ACA origins appear within normal limits. The left A1 segment is dominant. Anterior communicating artery and visualized ACA branches are within normal limits. Bilateral MCA M1 segments and MCA bifurcations are patent. Visible bilateral MCA branches are within normal limits. IMPRESSION: 1. Post-contrast images are degraded by motion. A subtle 3 mm rim-enhancing metastasis in the medial right thalamus is difficult to exclude but is favored to be artifactual. A repeat Brain MRI without and with contrast - done as an outpatient - to  confirm and re-stage is recommended. 2. Otherwise no metastatic disease or acute intracranial abnormality. 3. Negative intracranial MRA aside from intracranial artery dolichoectasia. Electronically Signed   By: Genevie Ann M.D.   On: 07/26/2016 11:58   Ct Head Code Stroke Wo Contrast  Result Date: 07/25/2016 CLINICAL DATA:  Code stroke. Sudden onset left arm weakness with altered mental status. EXAM: CT HEAD WITHOUT CONTRAST TECHNIQUE: Contiguous axial images were obtained from the base of the skull through the vertex without intravenous contrast. COMPARISON:  02/19/2008 FINDINGS: Brain: No evidence of acute infarction, hemorrhage, hydrocephalus, extra-axial collection or mass lesion/mass effect. Mild chronic microvascular ischemic change seen in the cerebral white matter, most notable in the subcortical left frontal region. Vascular: Atherosclerotic calcification.  No hyperdense vessel. Skull: No acute or aggressive finding. Sinuses/Orbits: Negative Other: Text page with results were sent at the time of interpretation on 07/25/2016 at 8:33 pm to [place]. Awaiting call back. ASPECTS [facility] Stroke Program Early CT Score) - Ganglionic level infarction (caudate, lentiform nuclei, internal capsule, insula, M1-M3 cortex): 7 - Supraganglionic infarction (M4-M6 cortex): 3 Total score (0-10 with 10 being normal): 10 IMPRESSION: 1. No acute finding.ASPECTS is 10. 2. Mild chronic microvascular disease. Electronically Signed   By: Monte Fantasia M.D.   On: 07/25/2016 20:36    Time Spent in minutes  25   Louellen Molder M.D on 07/26/2016 at 7:32 PM  Between 7am to 7pm - Pager - [phone]  After 7pm go to [email or web address] - password [facility]  [facility] -  Office  [phone]

## 2016-07-26 NOTE — Progress Notes (Addendum)
Subjective: Patient does not recall events. Per nurse when she had the event her O2 saturation was down in the 60% but there is not record of this. Currently she is back to her baseline.   Exam: Vitals:   07/26/16 0631 07/26/16 0930  BP: 119/63 (!) 115/55  Pulse: 74 80  Resp: 16 16  Temp: 98.1 F (36.7 C)         Gen: In bed, NAD MS: alert and oriented to hospital but not the year. She is able to follow simple commands. Speech clear.  CN:2-12 intact Motor: 5/5 throughout Sensory: intact but significant decreased vibratory and cold sensation from knee to the feet.  DTR:2+ UE and 1+ in the KJ or AJ  Pertinent Labs/Diagnostics: none    Impression: TIA versus encephalopathy in setting of hypoxemia with recrudescence of old prior stroke not seen on CT.   Long discussion was had with family about how daughter has noted a decline In memory and they should have this evaluated was out patient. Both husband and wife have had significant decline in memory and daughter is fearful of living situation. Will make ambulatory referal to neurology for this.    Recommendations: Plan: 1. HgbA1c, fasting lipid panel 2. MRI, MRA  of the brain without contrast 3. PT consult, OT consult, Speech consult 4. Echocardiogram 5. Carotid dopplers 6. Prophylactic therapy-Anticoagulation: Lovenox 7. Risk factor modification 8. Telemetry monitoring  Etta Quill PA-C [facility] [phone]  M-F  (8:30 am- 4 PM)  07/26/2016, 10:09 AM  07/26/2016, 9:56 AM

## 2016-07-26 NOTE — Progress Notes (Signed)
  Echocardiogram 2D Echocardiogram has been performed.  [name] 07/26/2016, 1:06 PM

## 2016-07-27 ENCOUNTER — Telehealth: Payer: Self-pay | Admitting: Family Medicine

## 2016-07-27 DIAGNOSIS — G939 Disorder of brain, unspecified: Secondary | ICD-10-CM

## 2016-07-27 DIAGNOSIS — C221 Intrahepatic bile duct carcinoma: Secondary | ICD-10-CM

## 2016-07-27 DIAGNOSIS — R112 Nausea with vomiting, unspecified: Secondary | ICD-10-CM

## 2016-07-27 DIAGNOSIS — D509 Iron deficiency anemia, unspecified: Secondary | ICD-10-CM

## 2016-07-27 LAB — LIPID PANEL
CHOL/HDL RATIO: 4.6 ratio
Cholesterol: 155 mg/dL (ref 0–200)
HDL: 34 mg/dL — AB (ref >40)
LDL CALC: 100 mg/dL — AB (ref 0–99)
Triglycerides: 107 mg/dL (ref <150)
VLDL: 21 mg/dL (ref 0–40)

## 2016-07-27 MED ORDER — MORPHINE SULFATE (CONCENTRATE) 10 MG/0.5ML PO SOLN
10.0000 mg | ORAL | Status: DC | PRN
  Administered 2016-07-27: 15 mg via ORAL
  Administered 2016-07-28 (×2): 10 mg via ORAL
  Filled 2016-07-27 (×2): qty 0.5
  Filled 2016-07-27: qty 1

## 2016-07-27 MED ORDER — PROMETHAZINE HCL 25 MG/ML IJ SOLN: 6.2500 mg | Freq: Four times a day (QID) | INTRAMUSCULAR | Status: DC | PRN

## 2016-07-27 MED ORDER — ENOXAPARIN SODIUM 150 MG/ML ~~LOC~~ SOLN
150.0000 mg | SUBCUTANEOUS | Status: DC
  Administered 2016-07-27: 150 mg via SUBCUTANEOUS
  Filled 2016-07-27 (×2): qty 1

## 2016-07-27 NOTE — Progress Notes (Addendum)
[facility] ([facility])   [facility] referral center received request from [facility] for family request for [name] services at home after discharge. Chart and patient information currently under review to confirm hospice eligibility.   Spoke with patient briefly who deferred me to daughter [name] to initiate education related to hospice philosophy, services and team approach to care. [name] verbalized understanding of the information provided.   Initial request from [facility] was for home with hospice today. Spoke with bedside RN and [name] who report plan changed to discharge home tomorrow. [name] declined offer to order DME. Dr. [name] has been confirmed to be attending of record.  Home address and contact information has been confirmed.    Please send signed completed DNR form home with patient.  Please send prescriptions for discharge comfort medications home with patient.  Please fax completed discharge summary to [facility] at [phone] when final.  Please notify [facility] when patient is ready to leave unit at discharge-call [phone].   Plan to follow up with family and RNCM tomorrow morning.   Thank you,  Erling Conte, LCSW [phone]

## 2016-07-27 NOTE — Progress Notes (Signed)
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 PMH:  SVT, urinary incontinence, wound Lt medial thigh to buttocks, COPD, HTN, chronic back pain, polymyalgia rheumatica, depression    PT Comments    Pt feeling "tired".  Assisted OOB to amb a limited distance due to fatigue, HR increased from 112 to 136 and RA decreased to 78%.  Limited activity tolerance.  Very  pleasant lady.  Plans to D/C to home.  Follow Up Recommendations  Home health PT;Supervision for mobility/OOB [facility])     Equipment Recommendations       Recommendations for Other Services       Precautions / Restrictions Precautions Precautions: Fall Precaution Comments: monitor sats Restrictions Weight Bearing Restrictions: No    Mobility  Bed Mobility Overal bed mobility: Needs Assistance Bed Mobility: Supine to Sit;Sit to Supine     Supine to sit: Supervision Sit to supine: Min assist   General bed mobility comments: assist for LEs per request of pt upon return to bed due to fatigue  Transfers Overall transfer level: Needs assistance Equipment used: Rolling walker (2 wheeled) Transfers: Sit to/from Stand Sit to Stand: Min assist;Min guard         General transfer comment: verbal cues for hand placement and increased time  Ambulation/Gait Ambulation/Gait assistance: Min guard;Min assist Ambulation Distance (Feet): 40 Feet (20 feet x 2 one sitting rest break) Assistive device: Rolling walker (2 wheeled) Gait Pattern/deviations: Step-through pattern;Trunk flexed Gait velocity: decreased   General Gait Details: verbal cues for safe use of RW, SpO2 dropped to 78% during ambulation   Stairs            Wheelchair Mobility    Modified Rankin  (Stroke Patients Only)       Balance                                    Cognition Arousal/Alertness: Awake/alert (sleepy) Behavior During Therapy: WFL for tasks assessed/performed Overall Cognitive Status: Within Functional Limits for tasks assessed                      Exercises      General Comments        Pertinent Vitals/Pain Pain Assessment: No/denies pain    Home Living                      Prior Function            PT Goals (current goals can now be found in the care plan section)      Frequency    Min 3X/week      PT Plan Current plan remains appropriate    Co-evaluation             End of Session Equipment Utilized During Treatment: Gait belt Activity Tolerance: Patient tolerated treatment well Patient left: in bed;with call bell/phone within reach;with bed alarm set;with family/visitor present     Time: [id number] PT Time Calculation (min) (ACUTE ONLY): 18 min  Charges:  $Gait Training: 8-22 mins                    G Codes:      Cecille Rubin  Lacole Komorowski  PTA [facility]  Acute  Rehab Pager      [phone]

## 2016-07-27 NOTE — Telephone Encounter (Signed)
Yes that is fine, thanks!

## 2016-07-27 NOTE — Progress Notes (Signed)
Pt's daughter at bedside told the pt that she had Cancer, that it had spread and there was no treatment at present time.  Pt asked if she was going home, her daughter stated yes with Hospice. This CM at that point offered choices, pt selected [facility]. Referral called to [place].

## 2016-07-27 NOTE — Telephone Encounter (Signed)
Amy from hospice called wanting to know if you can be the attending doctor.  If so, can they activate the hospice care orders?  cb number is [phone]

## 2016-07-27 NOTE — Progress Notes (Addendum)
PROGRESS NOTE                                                                                                                                                                                                             Patient Demographics:    [name], is a 80 y.o. female, DOB - 1936-05-04, [id number]  Admit date - 07/19/2016   Admitting Physician Velvet Bathe, MD  Outpatient Primary MD for the patient is [name], MD  LOS - 8  Outpatient Specialists: Dr Irene Limbo  Chief Complaint  Patient presents with  . Abdominal Pain       Brief Narrative   80 year old female with history of A. fib on anticoagulation, polymyalgia rheumatica on low-dose prednisone, IBS, COPD, MGUS (follows with Dr. Irene Limbo) presented with abdominal discomfort for last few days. She has also had almost 20 pound weight loss in the past 3 months. In the ED CT of the abdomen and pelvis showed a large liver lesion with extensive periportal adenopathy. Also showed gallbladder with sludge with questionable mass within the gallbladder and thickened wall. A CT angiogram of the chest done was positive for acute PE in the right middle and lower lobes. (Also showed a 1 cm left lower lobe pulmonary nodule).     Subjective:   No more confusion.abdominla pain stable on MS contin and oxycodone. 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 -CT-guided liver biopsy done on 12/1. poorly differentiated carcinoma either pancreaticobiliary or upper gastrointestinal primary. Cannot r/o cholangiocarcinoma or hepatobiliary primary.  -AFP negative and CEA markedly elevated (146).  Appreciate oncology consult. Given nature of malignancy with possibly thalamic mets, co morbidities she si a poor candidate for treatment options. Discussed hospice options and both patient an daughter open for home hospice.  palliative care involved with symptomatic management. They will discuss with patient and daughter tomorrow to assist setting up home hospice and suitable pain regimen for home. Care management aware and will consult home hospice.     Pulmonary embolism without acute cor pulmonale ([place])  Patient was on Xarelto at home but due to  cost issues has not been able to take them. Able to afford lovenox, so transitioned to it. Will d/c on once daily lovenox.  Hemoccult-positive. H&H Remains stable. -appreciated GI eval. No intervention needed.   Active Problems: TIA versus acute metabolic encephalopathy on 12/3. Code stroke called. Neurology consult appreciated. Stat head CT negative for infarct or bleeding. MRI brain negative for stroke but shows a possible subtle 3 mm rim-enhancing metastasis in the medial right thalamus. Carotid Doppler without significant stenosis. 2-D echo pending. No further neurological deficit.  Neurology consult appreciated.   Persistent abdominal pain Palliative care consult appreciated for pain management. Switched MS Contin to every 8 hours, continue oral oxycodone for breakthrough pain and IV Dilaudid. Added bowel regimen.  palliative care will adjsut pain meds prior to discharge tomorrow.   Anemia of chronic disease  Hemoccult positive. No need for colonoscopy at present per GI. H&H stable.   Hypothyroidism Continue Synthroid.  Chronic A. fib Rate controlled with metoprolol. On therapeutic Lovenox.    PMR (polymyalgia rheumatica) (HCC) Continue low-dose prednisone. Adjusted home MS Contin.  Transaminitis LFTs trending up possibly due to liver metastases.    Code Status : discussed GOC with pt and daughter, wishes to be DNR  Family  Communication  : Daughter At bedside  Disposition Plan  : Home possibly tomorrow once  home hospice set up and pain meds adjusted.  Barriers For Discharge :home hospice and pain managment  Consults  :  [facility] (Dr. Irene Limbo) Palliative care Neurology  Procedures  :  CT abdomen, chest with PE protocol MRI abdomen Liver biopsy  DVT Prophylaxis  : (therapeutic) Lovenox  Lab Results  Component Value Date   PLT 276 07/25/2016    Antibiotics  :    Anti-infectives    None        Objective:   Vitals:   07/26/16 2233 07/27/16 0517 07/27/16 0930 07/27/16 1353  BP: 112/60 120/62 118/60 (!) 123/56  Pulse: 84 93 (!) 119 71  Resp: 16 18  18   Temp: 99.6 F (37.6 C) 98.5 F (36.9 C)  97.8 F (36.6 C)  TempSrc: Oral Oral  Oral  SpO2: 93% 92%  96%  Weight:      Height:        Wt Readings from Last 3 Encounters:  07/19/16 96.6 kg (213 lb)  07/12/16 97.5 kg (215 lb)  07/04/16 98 kg (216 lb)    No intake or output data in the 24 hours ending 07/27/16 1638   Physical Exam  Gen: Appears fatigued HEENT: moist mucosa, supple neck Chest: clear b/l, no added sounds CVS: N S1&S2, no murmurs,  GI: soft, nondistended, bowel sounds present,No abdominal tenderness Musculoskeletal: warm, no edema CNS: Alert and oriented, nonfocal    Data Review:    CBC  Recent Labs Lab 07/21/16 0530 07/22/16 0521 07/23/16 0439 07/24/16 0527 07/25/16 0519  WBC 7.4 6.1 6.9 9.4 5.7  HGB 8.0* 7.9* 8.2* 8.9* 7.9*  HCT 25.4* 25.3* 26.6* 29.6* 25.9*  PLT 286 273 314 332 276  MCV 89.4 90.4 90.8 90.5 90.9  MCH 28.2 28.2 28.0 27.2 27.7  MCHC 31.5 31.2 30.8 30.1 30.5  RDW 18.2* 18.1* 18.0* 17.9* 17.9*  LYMPHSABS  --   --  2.2  --   --   MONOABS  --   --  0.6  --   --   EOSABS  --   --  0.1  --   --   BASOSABS  --   --  0.0  --   --     Chemistries   Recent Labs Lab 07/21/16 0530 07/23/16 0439 07/24/16 0527 07/26/16 0440  NA 135 136  --  134*  K 3.8 3.8  --  4.2    CL 102 101  --  99*  CO2 27 27  --  28  GLUCOSE 98 106*  --  98  BUN 11 15  --  14  CREATININE 0.73 0.84  --  0.59  CALCIUM 8.7* 8.8*  --  8.8*  AST  --  155* 80* 186*  ALT  --  89* 69* 138*  ALKPHOS  --  151* 159* 236*  BILITOT  --  0.3 1.0 1.1   ------------------------------------------------------------------------------------------------------------------  Recent Labs  07/27/16 0425  CHOL 155  HDL 34*  LDLCALC 100*  TRIG 107  CHOLHDL 4.6    Lab Results  Component Value Date   HGBA1C 5.7 (H) 06/17/2016   ------------------------------------------------------------------------------------------------------------------ No results for input(s): TSH, T4TOTAL, T3FREE, THYROIDAB in the last 72 hours.  Invalid input(s): FREET3 ------------------------------------------------------------------------------------------------------------------ No results for input(s): VITAMINB12, FOLATE, FERRITIN, TIBC, IRON, RETICCTPCT in the last 72 hours.  Coagulation profile  Recent Labs Lab 07/23/16 0439  INR 1.01    No results for input(s): DDIMER in the last 72 hours.  Cardiac Enzymes No results for input(s): CKMB, TROPONINI, MYOGLOBIN in the last 168 hours.  Invalid input(s): CK ------------------------------------------------------------------------------------------------------------------    Component Value Date/Time   BNP 197.8 (H) 07/04/2016 0056   BNP 130.2 (H) 09/22/2015 [id number]    Inpatient Medications  Scheduled Meds: . enoxaparin (LOVENOX) injection  150 mg Subcutaneous Q24H  . levothyroxine  88 mcg Oral QAC breakfast  . metoprolol succinate  150 mg Oral Daily  . morphine  30 mg Oral Q8H  . pantoprazole  40 mg Oral BID  . predniSONE  1 mg Oral Q breakfast  . sertraline  50 mg Oral Daily  . sodium chloride flush  3 mL Intravenous Q12H  . sodium chloride flush  3 mL Intravenous Q12H   Continuous Infusions:  PRN Meds:.sodium chloride, HYDROmorphone  (DILAUDID) injection, meclizine, menthol-cetylpyridinium, morphine CONCENTRATE, ondansetron (ZOFRAN) IV, promethazine, senna, sodium chloride flush  Micro Results No results found for this or any previous visit (from the past 240 hour(s)).  Radiology Reports Ct Angio Chest Pe W And/or Wo Contrast  Result Date: 07/19/2016 CLINICAL DATA:  Abnormal CT abdomen and pelvis.  Abdominal pain. EXAM: CT ANGIOGRAPHY CHEST WITH CONTRAST TECHNIQUE: Multidetector CT imaging of the chest was performed using the standard protocol during bolus administration of intravenous contrast. Multiplanar CT image reconstructions and MIPs were obtained to evaluate the vascular anatomy. CONTRAST:  100 cc Isovue 370 COMPARISON:  07/04/2016 FINDINGS: Cardiovascular: There is low-density filling defect within the right pulmonary artery segmental branch of the lateral basal segment of the right lower lobe. It extends into subsegmental branches. This is compatible with pulmonary thromboembolism. There is also segmental and subsegmental pulmonary thromboembolism at the base of the right middle lobe. There are no central filling defects in the pulmonary arterial tree. No evidence of left pulmonary thromboembolism. Coronary artery calcifications. Mediastinum/Nodes: No abnormal mediastinal adenopathy. Right infrahilar lymphoid tissue there is stable and mildly prominent. Lungs/Pleura: Advanced apical centrilobular emphysema. There is a questionable nodular density in the superior segment of the left lower lobe on image 42 of series 14, measuring 1.0 cm. There is scattered volume loss in the lungs. Dependent atelectasis bilaterally. No pneumothorax. No pleural effusion. Upper Abdomen: Simple cysts in  the left kidney. Musculoskeletal: No vertebral compression deformity. Mild scoliosis of the thoracic spine. Review of the MIP images confirms the above findings. IMPRESSION: The study is positive for acute pulmonary thromboembolism in the right  middle and lower lobes. This affects segmental and subsegmental branches. Possible 1.0 cm left lower lobe pulmonary nodule. Electronically Signed   By: Marybelle Killings M.D.   On: 07/19/2016 13:36   Ct Angio Chest Pe W And/or Wo Contrast  Result Date: 07/04/2016 CLINICAL DATA:  Positive D-dimer. History of hypertension and arrhythmias. EXAM: CT ANGIOGRAPHY CHEST WITH CONTRAST TECHNIQUE: Multidetector CT imaging of the chest was performed using the standard protocol during bolus administration of intravenous contrast. Multiplanar CT image reconstructions and MIPs were obtained to evaluate the vascular anatomy. CONTRAST:  100 mL Isovue 370 COMPARISON:  None. FINDINGS: Cardiovascular: Examination of the pulmonary arteries is somewhat limited by contrast bolus and motion artifact but there is no definite evidence of significant pulmonary embolus. Normal heart size. No pericardial effusion. Calcification of the aorta and coronary arteries. Mediastinum/Nodes: No enlarged mediastinal, hilar, or axillary lymph nodes. Thyroid gland, trachea, and esophagus demonstrate no significant findings. Lungs/Pleura: Atelectasis in the lung bases. Emphysematous changes particularly in the upper lungs. Slight fibrosis. No focal consolidation. No pleural effusions. No pneumothorax. Upper Abdomen: The appearance of the liver suggests hepatic cirrhosis. The lateral segment of the left lobe is enlarged and there appears to be mild lobulation to the contour. The gallbladder is distended and filled with mildly hyperechoic material, likely representing sludge although small stones or milk of calcium could also have this appearance. No gallbladder wall thickening. No bile duct dilatation. Musculoskeletal: No chest wall abnormality. No acute or significant osseous findings. Review of the MIP images confirms the above findings. IMPRESSION: No evidence of significant pulmonary embolus. Emphysematous changes, fibrosis, and atelectasis in the lungs.  Aortic atherosclerosis. Electronically Signed   By: Lucienne Capers M.D.   On: 07/04/2016 05:18   Mr Jeri Cos [id number] Contrast  Result Date: 07/26/2016 CLINICAL DATA:  80 year old female with new onset confusion. Liver mass. Possible gallbladder mass. Query metastatic disease. Initial encounter. EXAM: MRI HEAD WITHOUT AND WITH CONTRAST MRA HEAD WITHOUT CONTRAST TECHNIQUE: Multiplanar, multiecho pulse sequences of the brain and surrounding structures were obtained without and with intravenous contrast. Angiographic images of the head were obtained using MRA technique without contrast. CONTRAST:  62mL MULTIHANCE GADOBENATE DIMEGLUMINE 529 MG/ML IV SOLN COMPARISON:  Head CT without contrast 02/19/2008. FINDINGS: MRI HEAD FINDINGS Study is intermittently degraded by motion artifact despite repeated imaging attempts. Brain: No restricted diffusion to suggest acute infarction. No midline shift, mass effect, evidence of mass lesion, ventriculomegaly, extra-axial collection or acute intracranial hemorrhage. Cervicomedullary junction and pituitary are within normal limits. Motion artifact on post-contrast images of the brain. Difficult to exclude a 3-4 mm rim enhancing lesion along the medial right thalamus (series 12, image 27), although this might be artifact. There is no thalamic edema or mass effect although there might be mild T2 and FLAIR hyperintensity in the area of this lesion (series 7, image 12). No other convincing abnormal intracranial enhancement. No dural thickening identified. Scattered patchy bilateral cerebral white matter T2 and FLAIR hyperintensity, nonspecific and moderate for age. Similar patchy T2 hyperintensity in the pons. No cortical encephalomalacia or chronic cerebral blood products. Negative cerebellum. Vascular: Major intracranial vascular flow voids are preserved, dominant distal left vertebral artery and incidental pneumatized left anterior clinoid process. Skull and upper cervical spine:  Normal visible bone marrow signal. Grossly  negative visualized cervical spine. Sinuses/Orbits: Negative orbits soft tissues. Visualized paranasal sinuses and mastoids are well pneumatized. Other: Visible internal auditory structures appear normal. Negative scalp soft tissues. MRA HEAD FINDINGS Vessel detail degraded by motion. Antegrade flow in the posterior circulation with dominant and dolichoectatic distal left vertebral artery. The distal right vertebral artery appears patent. Both PICA origins appear normal. Patent basilar artery without stenosis. Right posterior communicating artery is present while the left is diminutive or absent. SCA and PCA origins and bilateral PCA branches appear within normal limits. Antegrade flow in both ICA siphons. Mild ICA dolichoectasia. No ICA stenosis. Ophthalmic and right posterior communicating artery origins appear normal. Patent carotid termini. MCA and ACA origins appear within normal limits. The left A1 segment is dominant. Anterior communicating artery and visualized ACA branches are within normal limits. Bilateral MCA M1 segments and MCA bifurcations are patent. Visible bilateral MCA branches are within normal limits. IMPRESSION: 1. Post-contrast images are degraded by motion. A subtle 3 mm rim-enhancing metastasis in the medial right thalamus is difficult to exclude but is favored to be artifactual. A repeat Brain MRI without and with contrast - done as an outpatient - to confirm and re-stage is recommended. 2. Otherwise no metastatic disease or acute intracranial abnormality. 3. Negative intracranial MRA aside from intracranial artery dolichoectasia. Electronically Signed   By: Genevie Ann M.D.   On: 07/26/2016 11:58   Mr Abdomen W Wo Contrast  Result Date: 07/21/2016 CLINICAL DATA:  Followup abnormal CT scan of the abdomen demonstrating a gallbladder mass and hepatic metastatic disease. EXAM: MRI ABDOMEN WITHOUT AND WITH CONTRAST TECHNIQUE: Multiplanar multisequence  MR imaging of the abdomen was performed both before and after the administration of intravenous contrast. CONTRAST:  62mL MULTIHANCE GADOBENATE DIMEGLUMINE 529 MG/ML IV SOLN COMPARISON:  CT scan 07/19/2016 FINDINGS: Lower chest: The lung bases are grossly clear. No pleural or pericardial effusion. Hepatobiliary: Numerous hepatic lesions are again demonstrated. The largest lesion is in segment 5 and measures 49 mm. There are 3 lesions and segment 4B the largest 2 lesions measure 26 mm and a smaller lesion measures 12 mm. These are all diffusion positive consistent with neoplasm. Enhancing tumor in the fundal region of the gallbladder is also diffusion positive and consistent with gallbladder cancer with hepatic metastatic disease. There are also enlarged periportal and celiac axis lymph nodes as demonstrated on the CT scan which are diffusion positive. Pancreas: No mass, inflammation or ductal dilatation. Peripancreatic adenopathy is again demonstrated. Spleen:  Normal size.  No focal lesions. Adrenals/Urinary Tract: The adrenal glands and kidneys are unremarkable. Stomach/Bowel: The stomach, duodenum, visualized small bowel and visualized colon are unremarkable. Vascular/Lymphatic: Advanced atherosclerotic calcifications involving the aorta and iliac arteries. No aneurysm. Enlarged metastatic periportal, celiac axis, peripancreatic and retroperitoneal lymph nodes. Other:  No ascites or abdominal wall hernia. Musculoskeletal: No significant bony findings IMPRESSION: 1. Enhancing and diffusion positive tumor in the gallbladder fundus consistent with gallbladder carcinoma. 2. Hepatic metastatic disease and upper abdominal metastatic lymphadenopathy. Electronically Signed   By: Marijo Sanes M.D.   On: 07/21/2016 15:02   Ct Abdomen Pelvis W Contrast  Result Date: 07/19/2016 CLINICAL DATA:  Abdominal pain EXAM: CT ABDOMEN AND PELVIS WITH CONTRAST TECHNIQUE: Multidetector CT imaging of the abdomen and pelvis was  performed using the standard protocol following bolus administration of intravenous contrast. Oral contrast was also administered. CONTRAST:  124mL ISOVUE-300 IOPAMIDOL (ISOVUE-300) INJECTION 61% COMPARISON:  CT abdomen and pelvis July 24, 2014 FINDINGS: Lower chest: There is bibasilar  lung atelectasis. Heart is mildly enlarged. There is a focal area of decreased attenuation in the region of the left ventricle seen on axial slice 1 series 2 measuring 1.5 x 1.4 cm. There is a questionable pulmonary embolus in a right lower lobe pulmonary artery seen on axial slice 1 series 2. Hepatobiliary: There is an infiltrative appearing mass in the anterior segment of the right lobe of the liver measuring 7.7 x 4.9 cm. No other focal liver lesion is evident. There is extensive sludge in the gallbladder. An underlying mass in the gallbladder cannot be excluded. The gallbladder wall appears rather prominent by CT. There is no appreciable biliary duct dilatation. Pancreas: No pancreatic mass or inflammatory focus evident. Spleen: No splenic lesions are evident. Adrenals/Urinary Tract: Adrenals appear unremarkable bilaterally. There are two cysts arising from the upper pole of the left kidney eccentrically measuring just over 1 cm in size each. No non cystic liver lesions are evident. There is no hydronephrosis on either side. There is fetal lobulation on each side, an anatomic variant. There is no appreciable renal or ureteral calculus on either side. Urinary bladder is midline with wall thickness within normal limits. Stomach/Bowel: There are scattered colonic diverticula, primarily in the sigmoid region without diverticulitis. There is fairly diffuse stool throughout much of the colon. There is a right-sided femoral hernia which contains small bowel but no bowel compromise. There is no bowel dilatation or air-fluid level suggesting bowel obstruction. No free air or portal venous air is evident. Vascular/Lymphatic: There is  extensive atherosclerotic calcification in the aorta and major pelvic arterial vessels diffusely. No aneurysms are evident. Major mesenteric arterial vessels appear patent with moderate atherosclerotic calcification noted, particularly at the origin of the left renal artery. There is periportal region adenopathy. The largest of the periportal lymph nodes measures 4.4 x 2.3 cm. There is no adenopathy distant to the periportal region. Reproductive: Uterus appears absent. No pelvic mass or pelvic fluid collection evident. Other: Appendix not appreciable. No periappendiceal region inflammation. No abscess or ascites evident in the abdomen or pelvis. Musculoskeletal: There is extensive degenerative change in the lumbar spine. There is mild anterolisthesis of L4 on L5 and mild anterolisthesis of L5 on S1, findings present previously. There are no blastic or lytic bone lesions. There is no intramuscular or abdominal wall lesion. IMPRESSION: Large liver lesion felt to represent neoplastic focus. There is extensive periportal adenopathy. The gallbladder contains sludge with questionable mass within the gallbladder. Gallbladder wall appears borderline thickened. These findings raise concern for possible carcinoma arising from the gallbladder with liver metastasis and periportal adenopathy. Primary liver carcinoma is a differential consideration. Given the proclivity of colon carcinoma to metastasize to the liver, it may be prudent to consider direct visualization of the colon in this regard. Questionable pulmonary embolus in a right lower lobe pulmonary artery branch. This finding may warrant CT angiogram of the chest for further assessment. Questionable lesion in the left ventricle. This finding may warrant echocardiography to further assess. There is extensive aortic atherosclerosis as well as atherosclerosis throughout the major pelvic vessels. No aneurysm evident. Right-sided femoral hernia containing small bowel but no  bowel compromise. Critical Value/emergent results were called by telephone at the time of interpretation on 07/19/2016 at 10:09 am to Lowell II, PA , who verbally acknowledged these results. Electronically Signed   By: Lowella Grip III M.D.   On: 07/19/2016 10:10   US Biopsy  Result Date: 07/23/2016 INDICATION: GALLBLADDER MASS WITH HEPATIC LESIONS COMPATIBLE  WITH METASTATIC DISEASE. EXAM: ULTRASOUND BIOPSY CORE LIVER MEDICATIONS: 1% LIDOCAINE LOCALLY ANESTHESIA/SEDATION: Moderate (conscious) sedation was employed during this procedure. A total of Versed 1.5 mg and Fentanyl 37.5 mcg was administered intravenously. Moderate Sedation Time: 7 minutes. The patient's level of consciousness and vital signs were monitored continuously by radiology nursing throughout the procedure under my direct supervision. FLUOROSCOPY TIME:  Fluoroscopy Time: NONE. COMPLICATIONS: COMPLICATIONS None immediate. PROCEDURE: Informed written consent was obtained from the patient after a thorough discussion of the procedural risks, benefits and alternatives. All questions were addressed. Maximal Sterile Barrier Technique was utilized including caps, mask, sterile gowns, sterile gloves, sterile drape, hand hygiene and skin antiseptic. A timeout was performed prior to the initiation of the procedure. Previous imaging reviewed. The right hepatic mass adjacent to the gallbladder was localized. Overlying skin marked. Under sterile conditions and local anesthesia, a 17 gauge 6.8 cm access needle was advanced percutaneously into the right hepatic mass. Needle position confirmed with ultrasound. Images obtained for documentation. 3 18 gauge core biopsies obtained. Needle removed. Postprocedure imaging demonstrates no hemorrhage or hematoma. Samples placed in formalin. Patient tolerated the biopsy well. IMPRESSION: Successful ultrasound right hepatic mass 18 gauge core biopsy Electronically Signed   By: Jerilynn Mages.  Shick M.D.   On:  07/23/2016 12:43   Dg Chest Port 1 View  Result Date: 07/04/2016 CLINICAL DATA:  Chest pain tonight.  Bradycardia. EXAM: PORTABLE CHEST 1 VIEW COMPARISON:  06/17/2016 FINDINGS: Shallow inspiration. Linear atelectasis in the lung bases. No focal airspace disease or consolidation. No blunting of costophrenic angles. No pneumothorax. Calcification of the aorta. Degenerative changes in the spine and shoulders. IMPRESSION: Linear atelectasis in the lung bases.  No focal consolidation. Electronically Signed   By: Lucienne Capers M.D.   On: 07/04/2016 02:04   Mr Jodene Nam Headm  Result Date: 07/26/2016 CLINICAL DATA:  80 year old female with new onset confusion. Liver mass. Possible gallbladder mass. Query metastatic disease. Initial encounter. EXAM: MRI HEAD WITHOUT AND WITH CONTRAST MRA HEAD WITHOUT CONTRAST TECHNIQUE: Multiplanar, multiecho pulse sequences of the brain and surrounding structures were obtained without and with intravenous contrast. Angiographic images of the head were obtained using MRA technique without contrast. CONTRAST:  33mL MULTIHANCE GADOBENATE DIMEGLUMINE 529 MG/ML IV SOLN COMPARISON:  Head CT without contrast 02/19/2008. FINDINGS: MRI HEAD FINDINGS Study is intermittently degraded by motion artifact despite repeated imaging attempts. Brain: No restricted diffusion to suggest acute infarction. No midline shift, mass effect, evidence of mass lesion, ventriculomegaly, extra-axial collection or acute intracranial hemorrhage. Cervicomedullary junction and pituitary are within normal limits. Motion artifact on post-contrast images of the brain. Difficult to exclude a 3-4 mm rim enhancing lesion along the medial right thalamus (series 12, image 27), although this might be artifact. There is no thalamic edema or mass effect although there might be mild T2 and FLAIR hyperintensity in the area of this lesion (series 7, image 12). No other convincing abnormal intracranial enhancement. No dural  thickening identified. Scattered patchy bilateral cerebral white matter T2 and FLAIR hyperintensity, nonspecific and moderate for age. Similar patchy T2 hyperintensity in the pons. No cortical encephalomalacia or chronic cerebral blood products. Negative cerebellum. Vascular: Major intracranial vascular flow voids are preserved, dominant distal left vertebral artery and incidental pneumatized left anterior clinoid process. Skull and upper cervical spine: Normal visible bone marrow signal. Grossly negative visualized cervical spine. Sinuses/Orbits: Negative orbits soft tissues. Visualized paranasal sinuses and mastoids are well pneumatized. Other: Visible internal auditory structures appear normal. Negative scalp soft tissues. MRA HEAD  FINDINGS Vessel detail degraded by motion. Antegrade flow in the posterior circulation with dominant and dolichoectatic distal left vertebral artery. The distal right vertebral artery appears patent. Both PICA origins appear normal. Patent basilar artery without stenosis. Right posterior communicating artery is present while the left is diminutive or absent. SCA and PCA origins and bilateral PCA branches appear within normal limits. Antegrade flow in both ICA siphons. Mild ICA dolichoectasia. No ICA stenosis. Ophthalmic and right posterior communicating artery origins appear normal. Patent carotid termini. MCA and ACA origins appear within normal limits. The left A1 segment is dominant. Anterior communicating artery and visualized ACA branches are within normal limits. Bilateral MCA M1 segments and MCA bifurcations are patent. Visible bilateral MCA branches are within normal limits. IMPRESSION: 1. Post-contrast images are degraded by motion. A subtle 3 mm rim-enhancing metastasis in the medial right thalamus is difficult to exclude but is favored to be artifactual. A repeat Brain MRI without and with contrast - done as an outpatient - to confirm and re-stage is recommended. 2.  Otherwise no metastatic disease or acute intracranial abnormality. 3. Negative intracranial MRA aside from intracranial artery dolichoectasia. Electronically Signed   By: Genevie Ann M.D.   On: 07/26/2016 11:58   Ct Head Code Stroke Wo Contrast  Result Date: 07/25/2016 CLINICAL DATA:  Code stroke. Sudden onset left arm weakness with altered mental status. EXAM: CT HEAD WITHOUT CONTRAST TECHNIQUE: Contiguous axial images were obtained from the base of the skull through the vertex without intravenous contrast. COMPARISON:  02/19/2008 FINDINGS: Brain: No evidence of acute infarction, hemorrhage, hydrocephalus, extra-axial collection or mass lesion/mass effect. Mild chronic microvascular ischemic change seen in the cerebral white matter, most notable in the subcortical left frontal region. Vascular: Atherosclerotic calcification.  No hyperdense vessel. Skull: No acute or aggressive finding. Sinuses/Orbits: Negative Other: Text page with results were sent at the time of interpretation on 07/25/2016 at 8:33 pm to [place]. Awaiting call back. ASPECTS [facility] Stroke Program Early CT Score) - Ganglionic level infarction (caudate, lentiform nuclei, internal capsule, insula, M1-M3 cortex): 7 - Supraganglionic infarction (M4-M6 cortex): 3 Total score (0-10 with 10 being normal): 10 IMPRESSION: 1. No acute finding.ASPECTS is 10. 2. Mild chronic microvascular disease. Electronically Signed   By: Monte Fantasia M.D.   On: 07/25/2016 20:36    Time Spent in minutes  25   Louellen Molder M.D on 07/27/2016 at 4:38 PM  Between 7am to 7pm - Pager - [phone]  After 7pm go to [email or web address] - password [facility]  [facility] -  Office  [phone]

## 2016-07-27 NOTE — Progress Notes (Addendum)
[name], [name], Lynn or lightheadedness with hypotension: No Has patient had a PCN reaction causing severe Hunter involving mucus membranes or skin necrosis: No Has patient had a PCN reaction that required hospitalization No Has patient had a PCN reaction occurring within the last 10 years: Yes If all of the above answers are "NO", then may proceed with Cephalosporin use.   . Aspirin Nausea And Vomiting  . Atorvastatin     REACTION: feel weak  . Carisoprodol     REACTION: intolerant  . Codeine     REACTION: hallucinations  . Codeine Phosphate     REACTION: UNKNOWN  . Cortisone     REACTION: pt. reports allergy, reaction not known  . Duoderm Hydroactive     Skin irritation and pain     . Ezetimibe-Simvastatin     REACTION: feels weak  . Naproxen Sodium     REACTION: GI symptoms  . Omeprazole     REACTION: abd pain  . Quinapril Hcl     REACTION: reports allergy, reaction not known  . Rofecoxib     REACTION: reports allergy, reaction not known  . Silvadene [Silver Sulfadiazine]     Skin irritation and pain   . Xarelto [Rivaroxaban] Other (See Comments)    Pain     Patient Measurements: Height: 5\' 7"  (170.2 cm) Weight: 213 lb (96.6 kg) IBW/kg (Calculated) : 61.6 Heparin Dosing Weight: 83kg  Vital Signs: Temp: 98.5 F (36.9 C) (12/05 0517) Temp Source: Oral (12/05 0517) BP: 120/62 (12/05 0517) Pulse Rate: 93 (12/05 0517)  Labs:  Recent Labs  07/24/16 1310 07/25/16 0519 07/26/16 0440  HGB  --  7.9*  --   HCT  --  25.9*  --   PLT  --  276  --   HEPARINUNFRC 0.25*  --   --   CREATININE  --   --  0.59    Estimated Creatinine Clearance: 69.2 mL/min (by C-G formula based on SCr of 0.59  mg/dL).   Medications:  Infusions:    Assessment: 74 yoF with PMHx polymyalgia rheumatica, chronic pain, IBS, diverticulitis, afib with SVT and PVCs, HTN/HLD, anemia, COPD and obesity presents with right sided abdominal pain.  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  Pharmacy consulted to start IV heparin.  Discussed case with PA as pt has positive FOB and hemoglobin has dropped 10.2 >> 8.5 in the last two weeks.  Will begin very conservatively with heparin and omit bolus.    Patient was on xarelto but stopped d/t GI intolerance, Cardiology started her on Eliquis but unable to afford. Patient states she has not been on Lynn for ~ 1 week. Started on heparin, then switched to Lovenox as copay is $0.  Significant events: 11/27: FOBT was positive but per GI no acute or subacute bleed likely; GI signed off as no interventions warranted other than blood/meds  12/3: Code stoke called 12/3 ~10PM for L sided weakness. CT showed no acute intracranial abnormality. Deficits rapidly resolved. Code cancelled. Likely experienced TIA.   Today, 07/27/2016:  CBC: Hgb low/stable, pltc WNL  SCr stable wnl  No bleeding noted  Goal of Therapy:  Heparin level 0.3-0.7 units/ml Monitor platelets by Lynn protocol: Yes   Plan:  Will switch to Lovenox 150 mg SQ q24 hr for more convenient home dosing  CBC q72h while on LMWH in hospital  Monitor for bleeding  Reuel Boom, PharmD, BCPS Pager: [phone] 07/27/2016, 8:55 AM

## 2016-07-27 NOTE — Progress Notes (Signed)
Daily Progress Note   Patient Name: [name]       Date: 07/27/2016 DOB: [birth year]  Age: 80 y.o. MRN#: [id number] Attending Physician: Louellen Molder, MD Primary Care Physician: [name], MD Admit Date: 07/19/2016  Reason for Consultation/Follow-up: Establishing goals of care  Subjective: I met [name] at her bedside, her daughter/HCPOA--Kay--was present. [name] was not verbally interactive during my meeting She appeared lethargic and resting comfortably in bed. [name] expressed concern over the biopsy results, which had just returned but had not yet been discussed with her. She has been thinking about the plan moving forward, and felt she really needed to meet with Dr. Irene Limbo (oncology) to know what the biopsy showed, and what options there were from the oncology perspective. She did recall the conversation she had with Dr. Rowe Pavy regarding disposition plan (home with hospice vs SNF rehab with palliative), and had no follow-up questions at present until she speaks with Dr. Irene Limbo.   At present, patient's only complaint is nausea post taking her Oxycodone IR for pain. She was given zofran, which helped.   Length of Stay: 8  Current Medications: Scheduled Meds:  . enoxaparin (LOVENOX) injection  150 mg Subcutaneous Q24H  . levothyroxine  88 mcg Oral QAC breakfast  . metoprolol succinate  150 mg Oral Daily  . morphine  30 mg Oral Q8H  . pantoprazole  40 mg Oral BID  . predniSONE  1 mg Oral Q breakfast  . sertraline  50 mg Oral Daily  . sodium chloride flush  3 mL Intravenous Q12H  . sodium chloride flush  3 mL Intravenous Q12H    Continuous Infusions:   PRN Meds: sodium chloride, HYDROmorphone (DILAUDID) injection, meclizine, menthol-cetylpyridinium, morphine  CONCENTRATE, ondansetron (ZOFRAN) IV, promethazine, senna, sodium chloride flush  Physical Exam  Constitutional: No distress.  HENT:  Head: Normocephalic and atraumatic.  Mouth/Throat: Oropharynx is clear and moist.  Eyes: EOM are normal.  Neck: Normal range of motion.  Cardiovascular: Normal rate.   Pulmonary/Chest: Effort normal.  Abdominal: Soft. Bowel sounds are normal.  Musculoskeletal:  Generalized weakness and appears deconditioned  Neurological:  Pt lethargic and resting in bed. She would wake to voice. Not verbally interactive, though did follow simple commands.   Skin: Skin is warm and dry. There is pallor.  Psychiatric:  Pt  did not verbally respond to my questions.            Vital Signs: BP (!) 123/56 (BP Location: Left Arm)   Pulse 71   Temp 97.8 F (36.6 C) (Oral)   Resp 18   Ht _0  (1.702 m)   Wt 96.6 kg (213 lb)   SpO2 96%   BMI 33.36 kg/m  SpO2: SpO2: 96 % O2 Device: O2 Device: Not Delivered O2 Flow Rate: O2 Flow Rate (L/min): 1 L/min  Intake/output summary:   Intake/Output Summary (Last 24 hours) at 07/27/16 1521 Last data filed at 07/26/16 1540  Gross per 24 hour  Intake                0 ml  Output                1 ml  Net               -1 ml   LBM: Last BM Date: 07/27/16 Baseline Weight: Weight: 96.6 kg (213 lb) Most recent weight: Weight: 96.6 kg (213 lb)  Palliative Assessment/Data: PPS 50%   Flowsheet Rows   Flowsheet Row Most Recent Value  Intake Tab  Referral Department  Hospitalist  Unit at Time of Referral  Med/Surg Unit  Palliative Care Primary Diagnosis  Cancer  Date Notified  07/24/16  Palliative Care Type  Return patient Palliative Care  Reason for referral  Pain, Non-pain Symptom, Clarify Goals of Care, Counsel Regarding Hospice  Date of Admission  07/19/16  Date first seen by Palliative Care  07/24/16  # of days Palliative referral response time  0 Day(s)  # of days IP prior to Palliative referral  5  Clinical  Assessment  Palliative Performance Scale Score  30%  Pain Max last 24 hours  4  Pain Min Last 24 hours  3  Dyspnea Max Last 24 Hours  4  Dyspnea Min Last 24 hours  3  Nausea Max Last 24 Hours  3  Nausea Min Last 24 Hours  2  Psychosocial & Spiritual Assessment  Palliative Care Outcomes  Patient/Family meeting held?  Yes  Who was at the meeting?  daughter [name]   Palliative Care Outcomes  Clarified goals of care      Patient Active Problem List   Diagnosis Date Noted  . Acute left-sided weakness   . Goals of care, counseling/discussion   . Stroke-like symptom   . Encounter for palliative care   . Right upper quadrant abdominal pain   . Abdominal pain, right upper quadrant   . Pulmonary embolism without acute cor pulmonale ([place]) 07/21/2016  . Hypothyroidism 07/21/2016  . Gallbladder mass 07/21/2016  . Liver metastases ([place]) 07/21/2016  . Liver mass   . MGUS (monoclonal gammopathy of unknown significance) 11/18/2015  . PMR (polymyalgia rheumatica) ([place]) 05/15/2014  . Depression 04/19/2011  . Essential hypertension 12/27/2006    Palliative Care Assessment & Plan   Patient Profile:  HPI: 80 year old woman with a past medical history significant for chronic pain, history of polyp magic rheumatica, MGUS for which she was following with hematology earlier this year. Medical history also pertinent for several hospitalizations earlier this year for urinary tract infection, possible arterial disease/thrombosis.   Patient has been admitted on 07/19/16 because of right-sided abdominal discomfort. Pertinent acute medical issues being addressed in this hospitalization include concern for metastatic malignancy, elevated CEA and CA-19-9 levels, acute pulmonary embolism in the right middle and right lower lobe, anxiety, uncontrolled  pain and discomfort. Liver biopsy preformed on 12/1, with results showing a metastatic poorly differentiated carcinoma (likley pancreatobiliary/upper GI primary,  though cholangiocarcinoma/hepatocellular carcinoma cannot be completely excluded). There had been concern for a TIA on 12/4, however MRI was negative for stroke.   Assessment: Pt has a new metastatic malignancy, confirmed by biopsy. This has not yet been discussed with pt or family. Given that Dr. Irene Limbo is already following, and family would need clarification on treatment options, I will defer to Oncology to disclose biopsy results and review management options/goals.   Symptomatically, pt's pain has improved with medication changes. She did experience increased nausea today after taking her Oxycodone.  Recommendations/Plan:  Pt and daughter are presently waiting on Dr. Irene Limbo for information on the biopsy results and management options. I will revisit tomorrow (after they have met with Dr. Irene Limbo) to follow-up on goals of care and disposition planning.   Pain: Continue MS Contin 67m q8H, will stop Oxycodone IR and start Roxanol 10-185mq4H, continue IV dilaudid 53m853m3H PRN.   Nausea: Continue Zofran 4mg62mH PRN, will start Phenergan 6.25mg25m PRN as seconary   Code Status:    Code Status Orders        Start     Ordered   07/19/16 1724  Full code  Continuous     07/19/16 1723    Code Status History    Date Active Date Inactive Code Status Order ID Comments User Context   06/17/2016  8:11 AM 06/24/2016  5:52 PM Full Code [id number] ParrED       Prognosis:   guarded.   Discharge Planning:  To Be Determined  Care plan was discussed with  Patient and daughter Kay. Lynn Danielank you for allowing the Palliative Medicine Team to assist in the care of this patient.   Time In:  1530 Time Out: 1545 Total Time  15 min  Prolonged Time Billed  no       Greater than 50%  of this time was spent counseling and coordinating care related to the above assessment and plan.  SarahJannette FogoPalliative Care  Please contact Palliative Medicine Team phone at [phone] questions and concerns.

## 2016-07-27 NOTE — Plan of Care (Signed)
Problem: Pain Managment: Goal: General experience of comfort will improve Outcome: Not Progressing Pain remains at 7/10 per pt and she is experiencing nausea after taking Oxy IR.   Zofran IV given and will hold MS contin for now until nausea resolves.

## 2016-07-27 NOTE — Progress Notes (Signed)
Subjective: No further symptoms.   Exam: Vitals:   07/26/16 2233 07/27/16 0517  BP: 112/60 120/62  Pulse: 84 93  Resp: 16 18  Temp: 99.6 F (37.6 C) 98.5 F (36.9 C)    Gen: In bed, NAD MS: alert and oriented to hospital but not the year. She is able to follow simple commands. Speech clear.  CN:2-12 intact Motor: 5/5 throughout Sensory: intact but significant decreased vibratory and cold sensation from knee to the feet.  DTR:2+ UE and 1+ in the KJ or AJ    Pertinent Labs/Diagnostics: MRI: IMPRESSION: 1. Post-contrast images are degraded by motion. A subtle 3 mm rim-enhancing metastasis in the medial right thalamus is difficult to exclude but is favored to be artifactual. A repeat Brain MRI without and with contrast - done as an outpatient - to confirm and re-stage is recommended. 2. Otherwise no metastatic disease or acute intracranial abnormality. 3. Negative intracranial MRA aside from intracranial artery dolichoectasia.   HbA1c pending LDL 100  Echo 55-60% no PFO Carotid doppler 1-39% ( WNL)      Impression: At this time patient is back to baseline. Likely symptoms of peeling of the onion from the right thalamic abnormality seen on MRI when she was hypoxic. No further strokle work up to be had. Will need oncology to follow MRI findings.    Neurology S/O   Etta Quill PA-C [facility] [phone]  07/27/2016, 8:52 AM

## 2016-07-28 LAB — CBC
HCT: 24.4 % — ABNORMAL LOW (ref 36.0–46.0)
Hemoglobin: 7.5 g/dL — ABNORMAL LOW (ref 12.0–15.0)
MCH: 27.4 pg (ref 26.0–34.0)
MCHC: 30.7 g/dL (ref 30.0–36.0)
MCV: 89.1 fL (ref 78.0–100.0)
PLATELETS: 296 10*3/uL (ref 150–400)
RBC: 2.74 MIL/uL — ABNORMAL LOW (ref 3.87–5.11)
RDW: 18.5 % — AB (ref 11.5–15.5)
WBC: 6.9 10*3/uL (ref 4.0–10.5)

## 2016-07-28 LAB — HEMOGLOBIN A1C
HEMOGLOBIN A1C: 5.5 % (ref 4.8–5.6)
MEAN PLASMA GLUCOSE: 111 mg/dL

## 2016-07-28 MED ORDER — ENOXAPARIN SODIUM 150 MG/ML ~~LOC~~ SOLN: 150.0000 mg | SUBCUTANEOUS | Status: AC

## 2016-07-28 MED ORDER — ONDANSETRON 4 MG PO TBDP: 4.0000 mg | ORAL_TABLET | Freq: Three times a day (TID) | ORAL | Status: DC | PRN

## 2016-07-28 MED ORDER — MORPHINE SULFATE ER 30 MG PO TBCR: 30.0000 mg | EXTENDED_RELEASE_TABLET | Freq: Every day | ORAL | 0 refills | Status: AC

## 2016-07-28 MED ORDER — ONDANSETRON 4 MG PO TBDP: 4.0000 mg | ORAL_TABLET | Freq: Three times a day (TID) | ORAL | 0 refills | Status: AC | PRN

## 2016-07-28 MED ORDER — SENNA 8.6 MG PO TABS
1.0000 | ORAL_TABLET | Freq: Every day | ORAL | Status: DC
  Administered 2016-07-28: 8.6 mg via ORAL
  Filled 2016-07-28: qty 1

## 2016-07-28 MED ORDER — MORPHINE SULFATE ER 15 MG PO TBCR: 15.0000 mg | EXTENDED_RELEASE_TABLET | Freq: Every day | ORAL | 0 refills | Status: AC

## 2016-07-28 MED ORDER — MORPHINE SULFATE (CONCENTRATE) 10 MG/0.5ML PO SOLN: 10.0000 mg | ORAL | 0 refills | Status: DC | PRN

## 2016-07-28 MED ORDER — MORPHINE SULFATE ER 30 MG PO TBCR: 30.0000 mg | EXTENDED_RELEASE_TABLET | Freq: Every day | ORAL | Status: DC

## 2016-07-28 MED ORDER — SENNA 8.6 MG PO TABS: 1.0000 | ORAL_TABLET | Freq: Every day | ORAL | 0 refills | Status: AC

## 2016-07-28 MED ORDER — MORPHINE SULFATE ER 15 MG PO TBCR
15.0000 mg | EXTENDED_RELEASE_TABLET | Freq: Every day | ORAL | Status: DC
  Administered 2016-07-28: 15 mg via ORAL
  Filled 2016-07-28: qty 1

## 2016-07-28 NOTE — Telephone Encounter (Signed)
Amy notified of Dr. Marliss Coots comments

## 2016-07-28 NOTE — Progress Notes (Signed)
Pt discharging to home with [facility]. [facility] called.

## 2016-07-28 NOTE — Progress Notes (Signed)
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  HISTORY OF PRESENTING ILLNESS:   [name] is a wonderful 80 y.o. 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  Patient has a history of multiple medical comorbidities and a poor functional status related to her polymyalgia rheumatica, obesity chronic pain issues and multiple other medical comorbidities. She was following with me in clinic for monitoring and management of MGUS. PET/CT scan done on 07/14/2015 showed no focal hypermetabolic bony uptake to suggest multiple myeloma. At her rest of her myeloma workup was unrevealing. She was incidentally noted to have a small focus of hypermetabolism/soft tissue lesion or calcified stone in the gallbladder. She was recommended an MRI of the abdomen with and without contrast but was hesitant to do this due to "too many other medical problems". She subsequently had an ultrasound of the abdomen on 12/30/2015 that showed an abnormal complex appearing normal focus in the gallbladder corresponding to the PET/CT findings that - may reflect a gallbladder malignancy or tumefactive sludge or a large non mobile stone and probable hemangioma anteriorly likely in the left hepatic lobe. She was again given a referral for a  MRI of the liver with and without contrast and a general surgery referral for further evaluation of this gallbladder finding and to consider possible endoscopic cholecystectomy.  Patient again declined the MRI and declined general surgery evaluation and did not pursue this despite extensive counseling.  She has had several hospitalizations including for urinary tract  infection, discoloration of her goals concerning for arterial disease/thrombosis, cutaneous breakdown in her gluteal folds being managed by her primary care physician.  She eventually scheduled her Gen. surgery clinic appointment was due a couple of days after her recent hospital admission. She was admitted on 07/19/2016 with right upper quadrant abdominal discomfort that was getting worse for the last 3-4 days.  She had a CT of the abdomen which showed large liver lesion and extensive periportal adenopathy and sludge with questionable mass within the gallbladder. Also question of a pulmonary embolus in the right lower lobe.  CTA of the chest was then done which showed acute pulmonary thromboembolism in the right middle and lower lobes affecting segmental and subsegmental branches.  Patient has been started on IV heparin. She had an MRI of the abdomen with and without contrast on 07/21/2016 which showed 1. Enhancing and diffusion positive tumor in the gallbladder fundus consistent with gallbladder carcinoma. 2. Hepatic metastatic disease and upper abdominal metastatic Lymphadenopathy.  Patient has been scheduled for CT-guided biopsy by interventional radiology with the hospitalist managing her periprocedural anticoagulation. Currently her ECOG performance status is 3. We discussed the concern that this likely represents a metastatic gallbladder cancer and that the pathology will determine the diagnosis. Patient is clear that she would not want any form of surgery and introduced to maintain an minimalistic treatment stance. She is not sure if she would want chemotherapy or radiation but does want to know what her diagnosis is. We discussed that her overall performance status with multiple medical problems skin breakdown would likely make her a poor candidate for palliative chemotherapy if this were noted to be a metastatic malignancy but we shall determine that  based on her functional status after  she leaves the hospital.  Interval History  Patient was seen in follow-up after availability of her biopsy pathology results which were discussed with Dr. Tresa Moore earlier today. As expected biopsy suggestive of hepatobiliary etiology (though patient immunohistochemistries alone upper GI etiology could not be ruled out). Patient had some focal neurological deficits and had MRI imaging of the brain that showed a subtle 3 mm rim enhancing lesion which is thought to be artifactual although a brain metastasis cannot be ruled out (clinically seems less likely to be metastasis). Patient is currently bed bound and feeling quite weak. She also appears overtly depressed. Still notes some right upper quadrant abdominal pain and some nausea. I discussed the pathology results in details with the patient in the presence of her daughter at bedside. We discussed the diagnosis staging grave prognosis and treatment options including palliative chemotherapy vs best supportive care's. Given the patient's personal approach to her health and wishes and her poor functional status patient and daughter are quite clear that they would like to proceed with best supportive care's through home hospice. We discussed that palliative chemotherapy is unlikely to improve her quality of life and could potentially make it worse. Patient notes that she would like to spend quality time with her family. I also anticipate that she would likely have significant symptom management needs and require significant physical and emotional support at home given her newly diagnosed metastatic cancer and significant burden of chronic co-morbidities.   MEDICAL HISTORY:  Past Medical History:  Diagnosis Date  . Chronic pain    Managed by Dr. Hardin Negus  . Diverticulitis   . Hyperlipidemia   . Hypertension   . IBS (irritable bowel syndrome)   . Palpitations   . Polymyalgia rheumatica ([place])   . PVC's (premature ventricular contractions)   . SVT  (supraventricular tachycardia) ([place])   . Tobacco dependence     SURGICAL HISTORY: Past Surgical History:  Procedure Laterality Date  . ABDOMINAL HYSTERECTOMY Bilateral   . NASAL SINUS SURGERY    . REPLACEMENT TOTAL KNEE    . TONSILLECTOMY    . WRIST FRACTURE SURGERY      SOCIAL HISTORY: Social History   Social History  . Marital status: Married    Spouse name: N/A  . Number of children: 2  . Years of education: N/A   Occupational History  .  Retired   Social History Main Topics  . Smoking status: Former Smoker    Packs/day: 0.50    Quit date: 12/12/2011  . Smokeless tobacco: Never Used  . Alcohol use No  . Drug use: No  . Sexual activity: Not Currently    Birth control/ protection: None   Other Topics Concern  . Not on file   Social History Narrative  . No narrative on file    FAMILY HISTORY: Family History  Problem Relation Age of Onset  . Diabetes Mother   . Heart attack Father   . Heart disease Sister     ALLERGIES:  is allergic to amoxicillin-pot clavulanate; aspirin; atorvastatin; carisoprodol; codeine; codeine phosphate; cortisone; duoderm hydroactive; ezetimibe-simvastatin; naproxen sodium; omeprazole; quinapril hcl; rofecoxib; silvadene [silver sulfadiazine]; and xarelto [rivaroxaban].  MEDICATIONS:  Current Facility-Administered Medications  Medication Dose Route Frequency Provider Last Rate Last Dose  . 0.9 %  sodium chloride infusion  250 mL Intravenous PRN Velvet Bathe, MD   Stopped at 07/24/16 2200  . enoxaparin (LOVENOX) injection 150 mg  150 mg Subcutaneous Q24H Dian Situ A  Wofford, RPH   150 mg at 07/27/16 2313  . HYDROmorphone (DILAUDID) injection 1 mg  1 mg Intravenous Q3H PRN Nishant Dhungel, MD   1 mg at 07/28/16 0154  . levothyroxine (SYNTHROID, LEVOTHROID) tablet 88 mcg  88 mcg Oral QAC breakfast Velvet Bathe, MD   88 mcg at 07/27/16 0810  . meclizine (ANTIVERT) tablet 25 mg  25 mg Oral TID PRN Velvet Bathe, MD      .  menthol-cetylpyridinium (CEPACOL) lozenge 3 mg  1 lozenge Oral PRN Nishant Dhungel, MD      . metoprolol succinate (TOPROL-XL) 24 hr tablet 150 mg  150 mg Oral Daily Velvet Bathe, MD   150 mg at 07/27/16 0930  . morphine (MS CONTIN) 12 hr tablet 15 mg  15 mg Oral Daily Jannette Fogo, NP       And  . morphine (MS CONTIN) 12 hr tablet 30 mg  30 mg Oral QHS Jannette Fogo, NP      . morphine CONCENTRATE 10 MG/0.5ML oral solution 10-15 mg  10-15 mg Oral Q4H PRN Jannette Fogo, NP   15 mg at 07/27/16 2315  . ondansetron (ZOFRAN-ODT) disintegrating tablet 4 mg  4 mg Oral Q8H PRN Jannette Fogo, NP      . pantoprazole (PROTONIX) EC tablet 40 mg  40 mg Oral BID Willia Craze, NP   40 mg at 07/27/16 2313  . predniSONE (DELTASONE) tablet 1 mg  1 mg Oral Q breakfast Robbie Lis, MD   1 mg at 07/27/16 0810  . senna (SENOKOT) tablet 8.6 mg  1 tablet Oral Daily Jannette Fogo, NP      . sertraline (ZOLOFT) tablet 50 mg  50 mg Oral Daily Velvet Bathe, MD   50 mg at 07/27/16 0930  . sodium chloride flush (NS) 0.9 % injection 3 mL  3 mL Intravenous Q12H Velvet Bathe, MD   3 mL at 07/27/16 2200  . sodium chloride flush (NS) 0.9 % injection 3 mL  3 mL Intravenous Q12H Velvet Bathe, MD   3 mL at 07/27/16 0931  . sodium chloride flush (NS) 0.9 % injection 3 mL  3 mL Intravenous PRN Velvet Bathe, MD        REVIEW OF SYSTEMS:    10 Point review of Systems was done is negative except as noted above.  PHYSICAL EXAMINATION: ECOG PERFORMANCE STATUS: 3 - Symptomatic, >50% confined to bed  . Vitals:   07/27/16 2107 07/28/16 0531  BP: 132/76 115/70  Pulse: 75 79  Resp: 18 18  Temp: 99.1 F (37.3 C) 98.4 F (36.9 C)   Filed Weights   07/19/16 0616  Weight: 213 lb (96.6 kg)   .Body mass index is 33.36 kg/m.  GENERAL:alert, in no acute distress and comfortable SKIN: skin color, texture, turgor are normal, no rashes or significant lesions EYES: normal, conjunctiva are pink  and non-injected, sclera clear OROPHARYNX:no exudate, no erythema and lips, buccal mucosa, and tongue normal  NECK: supple, no JVD, thyroid normal size, non-tender, without nodularity LYMPH:  no palpable lymphadenopathy in the cervical, axillary or inguinal LUNGS: clear to auscultation with normal respiratory effort HEART: regular rate & rhythm,  no murmurs and no lower extremity edema ABDOMEN: abdomen soft, non-tender, normoactive bowel sounds , mild tenderness to palpation over the right upper quadrant Musculoskeletal: no cyanosis of digits and no clubbing  PSYCH: alert & oriented x 3 with fluent speech NEURO: no focal motor/sensory deficits  LABORATORY DATA:  I have reviewed the data as listed  . CBC Latest Ref Rng & Units 07/28/2016 07/25/2016 07/24/2016  WBC 4.0 - 10.5 K/uL 6.9 5.7 9.4  Hemoglobin 12.0 - 15.0 g/dL 7.5(L) 7.9(L) 8.9(L)  Hematocrit 36.0 - 46.0 % 24.4(L) 25.9(L) 29.6(L)  Platelets 150 - 400 K/uL 296 276 332   . CBC    Component Value Date/Time   WBC 6.9 07/28/2016 0519   RBC 2.74 (L) 07/28/2016 0519   HGB 7.5 (L) 07/28/2016 0519   HGB 11.6 03/22/2016 1520   HCT 24.4 (L) 07/28/2016 0519   HCT 36.2 03/22/2016 1520   PLT 296 07/28/2016 0519   PLT 211 03/22/2016 1520   MCV 89.1 07/28/2016 0519   MCV 94.5 03/22/2016 1520   MCH 27.4 07/28/2016 0519   MCHC 30.7 07/28/2016 0519   RDW 18.5 (H) 07/28/2016 0519   RDW 16.1 (H) 03/22/2016 1520   LYMPHSABS 2.2 07/23/2016 0439   LYMPHSABS 3.3 03/22/2016 1520   MONOABS 0.6 07/23/2016 0439   MONOABS 0.6 03/22/2016 1520   EOSABS 0.1 07/23/2016 0439   EOSABS 0.1 03/22/2016 1520   BASOSABS 0.0 07/23/2016 0439   BASOSABS 0.0 03/22/2016 1520    . CMP Latest Ref Rng & Units 07/26/2016 07/24/2016 07/23/2016  Glucose 65 - 99 mg/dL 98 - 106(H)  BUN 6 - 20 mg/dL 14 - 15  Creatinine 0.44 - 1.00 mg/dL 0.59 - 0.84  Sodium 135 - 145 mmol/L 134(L) - 136  Potassium 3.5 - 5.1 mmol/L 4.2 - 3.8  Chloride 101 - 111 mmol/L 99(L) - 101    CO2 22 - 32 mmol/L 28 - 27  Calcium 8.9 - 10.3 mg/dL 8.8(L) - 8.8(L)  Total Protein 6.5 - 8.1 g/dL 5.9(L) 7.3 6.3(L)  Total Bilirubin 0.3 - 1.2 mg/dL 1.1 1.0 0.3  Alkaline Phos 38 - 126 U/L 236(H) 159(H) 151(H)  AST 15 - 41 U/L 186(H) 80(H) 155(H)  ALT 14 - 54 U/L 138(H) 69(H) 89(H)   Component     Latest Ref Rng & Units 07/21/2016 07/22/2016  CEA     0.0 - 4.7 ng/mL 146.4 (H)   AFP Tumor Marker     0.0 - 8.3 ng/mL 4.0   CA 19-9     0 - 35 U/mL  41 (H)     RADIOGRAPHIC STUDIES: I have personally reviewed the radiological images as listed and agreed with the findings in the report. Ct Angio Chest Pe W And/or Wo Contrast  Result Date: 07/19/2016 CLINICAL DATA:  Abnormal CT abdomen and pelvis.  Abdominal pain. EXAM: CT ANGIOGRAPHY CHEST WITH CONTRAST TECHNIQUE: Multidetector CT imaging of the chest was performed using the standard protocol during bolus administration of intravenous contrast. Multiplanar CT image reconstructions and MIPs were obtained to evaluate the vascular anatomy. CONTRAST:  100 cc Isovue 370 COMPARISON:  07/04/2016 FINDINGS: Cardiovascular: There is low-density filling defect within the right pulmonary artery segmental branch of the lateral basal segment of the right lower lobe. It extends into subsegmental branches. This is compatible with pulmonary thromboembolism. There is also segmental and subsegmental pulmonary thromboembolism at the base of the right middle lobe. There are no central filling defects in the pulmonary arterial tree. No evidence of left pulmonary thromboembolism. Coronary artery calcifications. Mediastinum/Nodes: No abnormal mediastinal adenopathy. Right infrahilar lymphoid tissue there is stable and mildly prominent. Lungs/Pleura: Advanced apical centrilobular emphysema. There is a questionable nodular density in the superior segment of the left lower lobe on image 42 of series 14, measuring 1.0 cm. There  is scattered volume loss in the lungs.  Dependent atelectasis bilaterally. No pneumothorax. No pleural effusion. Upper Abdomen: Simple cysts in the left kidney. Musculoskeletal: No vertebral compression deformity. Mild scoliosis of the thoracic spine. Review of the MIP images confirms the above findings. IMPRESSION: The study is positive for acute pulmonary thromboembolism in the right middle and lower lobes. This affects segmental and subsegmental branches. Possible 1.0 cm left lower lobe pulmonary nodule. Electronically Signed   By: Marybelle Killings M.D.   On: 07/19/2016 13:36   Ct Angio Chest Pe W And/or Wo Contrast  Result Date: 07/04/2016 CLINICAL DATA:  Positive D-dimer. History of hypertension and arrhythmias. EXAM: CT ANGIOGRAPHY CHEST WITH CONTRAST TECHNIQUE: Multidetector CT imaging of the chest was performed using the standard protocol during bolus administration of intravenous contrast. Multiplanar CT image reconstructions and MIPs were obtained to evaluate the vascular anatomy. CONTRAST:  100 mL Isovue 370 COMPARISON:  None. FINDINGS: Cardiovascular: Examination of the pulmonary arteries is somewhat limited by contrast bolus and motion artifact but there is no definite evidence of significant pulmonary embolus. Normal heart size. No pericardial effusion. Calcification of the aorta and coronary arteries. Mediastinum/Nodes: No enlarged mediastinal, hilar, or axillary lymph nodes. Thyroid gland, trachea, and esophagus demonstrate no significant findings. Lungs/Pleura: Atelectasis in the lung bases. Emphysematous changes particularly in the upper lungs. Slight fibrosis. No focal consolidation. No pleural effusions. No pneumothorax. Upper Abdomen: The appearance of the liver suggests hepatic cirrhosis. The lateral segment of the left lobe is enlarged and there appears to be mild lobulation to the contour. The gallbladder is distended and filled with mildly hyperechoic material, likely representing sludge although small stones or milk of calcium  could also have this appearance. No gallbladder wall thickening. No bile duct dilatation. Musculoskeletal: No chest wall abnormality. No acute or significant osseous findings. Review of the MIP images confirms the above findings. IMPRESSION: No evidence of significant pulmonary embolus. Emphysematous changes, fibrosis, and atelectasis in the lungs. Aortic atherosclerosis. Electronically Signed   By: Lucienne Capers M.D.   On: 07/04/2016 05:18   Mr Jeri Cos SW Contrast  Result Date: 07/26/2016 CLINICAL DATA:  80 year old female with new onset confusion. Liver mass. Possible gallbladder mass. Query metastatic disease. Initial encounter. EXAM: MRI HEAD WITHOUT AND WITH CONTRAST MRA HEAD WITHOUT CONTRAST TECHNIQUE: Multiplanar, multiecho pulse sequences of the brain and surrounding structures were obtained without and with intravenous contrast. Angiographic images of the head were obtained using MRA technique without contrast. CONTRAST:  38m MULTIHANCE GADOBENATE DIMEGLUMINE 529 MG/ML IV SOLN COMPARISON:  Head CT without contrast 02/19/2008. FINDINGS: MRI HEAD FINDINGS Study is intermittently degraded by motion artifact despite repeated imaging attempts. Brain: No restricted diffusion to suggest acute infarction. No midline shift, mass effect, evidence of mass lesion, ventriculomegaly, extra-axial collection or acute intracranial hemorrhage. Cervicomedullary junction and pituitary are within normal limits. Motion artifact on post-contrast images of the brain. Difficult to exclude a 3-4 mm rim enhancing lesion along the medial right thalamus (series 12, image 27), although this might be artifact. There is no thalamic edema or mass effect although there might be mild T2 and FLAIR hyperintensity in the area of this lesion (series 7, image 12). No other convincing abnormal intracranial enhancement. No dural thickening identified. Scattered patchy bilateral cerebral white matter T2 and FLAIR hyperintensity, nonspecific  and moderate for age. Similar patchy T2 hyperintensity in the pons. No cortical encephalomalacia or chronic cerebral blood products. Negative cerebellum. Vascular: Major intracranial vascular flow voids are preserved, dominant distal left  vertebral artery and incidental pneumatized left anterior clinoid process. Skull and upper cervical spine: Normal visible bone marrow signal. Grossly negative visualized cervical spine. Sinuses/Orbits: Negative orbits soft tissues. Visualized paranasal sinuses and mastoids are well pneumatized. Other: Visible internal auditory structures appear normal. Negative scalp soft tissues. MRA HEAD FINDINGS Vessel detail degraded by motion. Antegrade flow in the posterior circulation with dominant and dolichoectatic distal left vertebral artery. The distal right vertebral artery appears patent. Both PICA origins appear normal. Patent basilar artery without stenosis. Right posterior communicating artery is present while the left is diminutive or absent. SCA and PCA origins and bilateral PCA branches appear within normal limits. Antegrade flow in both ICA siphons. Mild ICA dolichoectasia. No ICA stenosis. Ophthalmic and right posterior communicating artery origins appear normal. Patent carotid termini. MCA and ACA origins appear within normal limits. The left A1 segment is dominant. Anterior communicating artery and visualized ACA branches are within normal limits. Bilateral MCA M1 segments and MCA bifurcations are patent. Visible bilateral MCA branches are within normal limits. IMPRESSION: 1. Post-contrast images are degraded by motion. A subtle 3 mm rim-enhancing metastasis in the medial right thalamus is difficult to exclude but is favored to be artifactual. A repeat Brain MRI without and with contrast - done as an outpatient - to confirm and re-stage is recommended. 2. Otherwise no metastatic disease or acute intracranial abnormality. 3. Negative intracranial MRA aside from intracranial  artery dolichoectasia. Electronically Signed   By: Genevie Ann M.D.   On: 07/26/2016 11:58   Mr Abdomen W Wo Contrast  Result Date: 07/21/2016 CLINICAL DATA:  Followup abnormal CT scan of the abdomen demonstrating a gallbladder mass and hepatic metastatic disease. EXAM: MRI ABDOMEN WITHOUT AND WITH CONTRAST TECHNIQUE: Multiplanar multisequence MR imaging of the abdomen was performed both before and after the administration of intravenous contrast. CONTRAST:  6m MULTIHANCE GADOBENATE DIMEGLUMINE 529 MG/ML IV SOLN COMPARISON:  CT scan 07/19/2016 FINDINGS: Lower chest: The lung bases are grossly clear. No pleural or pericardial effusion. Hepatobiliary: Numerous hepatic lesions are again demonstrated. The largest lesion is in segment 5 and measures 49 mm. There are 3 lesions and segment 4B the largest 2 lesions measure 26 mm and a smaller lesion measures 12 mm. These are all diffusion positive consistent with neoplasm. Enhancing tumor in the fundal region of the gallbladder is also diffusion positive and consistent with gallbladder cancer with hepatic metastatic disease. There are also enlarged periportal and celiac axis lymph nodes as demonstrated on the CT scan which are diffusion positive. Pancreas: No mass, inflammation or ductal dilatation. Peripancreatic adenopathy is again demonstrated. Spleen:  Normal size.  No focal lesions. Adrenals/Urinary Tract: The adrenal glands and kidneys are unremarkable. Stomach/Bowel: The stomach, duodenum, visualized small bowel and visualized colon are unremarkable. Vascular/Lymphatic: Advanced atherosclerotic calcifications involving the aorta and iliac arteries. No aneurysm. Enlarged metastatic periportal, celiac axis, peripancreatic and retroperitoneal lymph nodes. Other:  No ascites or abdominal wall hernia. Musculoskeletal: No significant bony findings IMPRESSION: 1. Enhancing and diffusion positive tumor in the gallbladder fundus consistent with gallbladder carcinoma. 2.  Hepatic metastatic disease and upper abdominal metastatic lymphadenopathy. Electronically Signed   By: PMarijo SanesM.D.   On: 07/21/2016 15:02   Ct Abdomen Pelvis W Contrast  Result Date: 07/19/2016 CLINICAL DATA:  Abdominal pain EXAM: CT ABDOMEN AND PELVIS WITH CONTRAST TECHNIQUE: Multidetector CT imaging of the abdomen and pelvis was performed using the standard protocol following bolus administration of intravenous contrast. Oral contrast was also administered. CONTRAST:  1018m  ISOVUE-300 IOPAMIDOL (ISOVUE-300) INJECTION 61% COMPARISON:  CT abdomen and pelvis July 24, 2014 FINDINGS: Lower chest: There is bibasilar lung atelectasis. Heart is mildly enlarged. There is a focal area of decreased attenuation in the region of the left ventricle seen on axial slice 1 series 2 measuring 1.5 x 1.4 cm. There is a questionable pulmonary embolus in a right lower lobe pulmonary artery seen on axial slice 1 series 2. Hepatobiliary: There is an infiltrative appearing mass in the anterior segment of the right lobe of the liver measuring 7.7 x 4.9 cm. No other focal liver lesion is evident. There is extensive sludge in the gallbladder. An underlying mass in the gallbladder cannot be excluded. The gallbladder wall appears rather prominent by CT. There is no appreciable biliary duct dilatation. Pancreas: No pancreatic mass or inflammatory focus evident. Spleen: No splenic lesions are evident. Adrenals/Urinary Tract: Adrenals appear unremarkable bilaterally. There are two cysts arising from the upper pole of the left kidney eccentrically measuring just over 1 cm in size each. No non cystic liver lesions are evident. There is no hydronephrosis on either side. There is fetal lobulation on each side, an anatomic variant. There is no appreciable renal or ureteral calculus on either side. Urinary bladder is midline with wall thickness within normal limits. Stomach/Bowel: There are scattered colonic diverticula, primarily in the  sigmoid region without diverticulitis. There is fairly diffuse stool throughout much of the colon. There is a right-sided femoral hernia which contains small bowel but no bowel compromise. There is no bowel dilatation or air-fluid level suggesting bowel obstruction. No free air or portal venous air is evident. Vascular/Lymphatic: There is extensive atherosclerotic calcification in the aorta and major pelvic arterial vessels diffusely. No aneurysms are evident. Major mesenteric arterial vessels appear patent with moderate atherosclerotic calcification noted, particularly at the origin of the left renal artery. There is periportal region adenopathy. The largest of the periportal lymph nodes measures 4.4 x 2.3 cm. There is no adenopathy distant to the periportal region. Reproductive: Uterus appears absent. No pelvic mass or pelvic fluid collection evident. Other: Appendix not appreciable. No periappendiceal region inflammation. No abscess or ascites evident in the abdomen or pelvis. Musculoskeletal: There is extensive degenerative change in the lumbar spine. There is mild anterolisthesis of L4 on L5 and mild anterolisthesis of L5 on S1, findings present previously. There are no blastic or lytic bone lesions. There is no intramuscular or abdominal wall lesion. IMPRESSION: Large liver lesion felt to represent neoplastic focus. There is extensive periportal adenopathy. The gallbladder contains sludge with questionable mass within the gallbladder. Gallbladder wall appears borderline thickened. These findings raise concern for possible carcinoma arising from the gallbladder with liver metastasis and periportal adenopathy. Primary liver carcinoma is a differential consideration. Given the proclivity of colon carcinoma to metastasize to the liver, it may be prudent to consider direct visualization of the colon in this regard. Questionable pulmonary embolus in a right lower lobe pulmonary artery branch. This finding may  warrant CT angiogram of the chest for further assessment. Questionable lesion in the left ventricle. This finding may warrant echocardiography to further assess. There is extensive aortic atherosclerosis as well as atherosclerosis throughout the major pelvic vessels. No aneurysm evident. Right-sided femoral hernia containing small bowel but no bowel compromise. Critical Value/emergent results were called by telephone at the time of interpretation on 07/19/2016 at 10:09 am to Cop[place] II, PA , who verbally acknowledged these results. Electronically Signed   By: Lowella Grip III M.D.  On: 07/19/2016 10:10   US Biopsy  Result Date: 07/23/2016 INDICATION: GALLBLADDER MASS WITH HEPATIC LESIONS COMPATIBLE WITH METASTATIC DISEASE. EXAM: ULTRASOUND BIOPSY CORE LIVER MEDICATIONS: 1% LIDOCAINE LOCALLY ANESTHESIA/SEDATION: Moderate (conscious) sedation was employed during this procedure. A total of Versed 1.5 mg and Fentanyl 37.5 mcg was administered intravenously. Moderate Sedation Time: 7 minutes. The patient's level of consciousness and vital signs were monitored continuously by radiology nursing throughout the procedure under my direct supervision. FLUOROSCOPY TIME:  Fluoroscopy Time: NONE. COMPLICATIONS: COMPLICATIONS None immediate. PROCEDURE: Informed written consent was obtained from the patient after a thorough discussion of the procedural risks, benefits and alternatives. All questions were addressed. Maximal Sterile Barrier Technique was utilized including caps, mask, sterile gowns, sterile gloves, sterile drape, hand hygiene and skin antiseptic. A timeout was performed prior to the initiation of the procedure. Previous imaging reviewed. The right hepatic mass adjacent to the gallbladder was localized. Overlying skin marked. Under sterile conditions and local anesthesia, a 17 gauge 6.8 cm access needle was advanced percutaneously into the right hepatic mass. Needle position confirmed with  ultrasound. Images obtained for documentation. 3 18 gauge core biopsies obtained. Needle removed. Postprocedure imaging demonstrates no hemorrhage or hematoma. Samples placed in formalin. Patient tolerated the biopsy well. IMPRESSION: Successful ultrasound right hepatic mass 18 gauge core biopsy Electronically Signed   By: Jerilynn Mages.  Shick M.D.   On: 07/23/2016 12:43   Dg Chest Port 1 View  Result Date: 07/04/2016 CLINICAL DATA:  Chest pain tonight.  Bradycardia. EXAM: PORTABLE CHEST 1 VIEW COMPARISON:  06/17/2016 FINDINGS: Shallow inspiration. Linear atelectasis in the lung bases. No focal airspace disease or consolidation. No blunting of costophrenic angles. No pneumothorax. Calcification of the aorta. Degenerative changes in the spine and shoulders. IMPRESSION: Linear atelectasis in the lung bases.  No focal consolidation. Electronically Signed   By: Lucienne Capers M.D.   On: 07/04/2016 02:04   Mr Jodene Nam Headm  Result Date: 07/26/2016 CLINICAL DATA:  80 year old female with new onset confusion. Liver mass. Possible gallbladder mass. Query metastatic disease. Initial encounter. EXAM: MRI HEAD WITHOUT AND WITH CONTRAST MRA HEAD WITHOUT CONTRAST TECHNIQUE: Multiplanar, multiecho pulse sequences of the brain and surrounding structures were obtained without and with intravenous contrast. Angiographic images of the head were obtained using MRA technique without contrast. CONTRAST:  42m MULTIHANCE GADOBENATE DIMEGLUMINE 529 MG/ML IV SOLN COMPARISON:  Head CT without contrast 02/19/2008. FINDINGS: MRI HEAD FINDINGS Study is intermittently degraded by motion artifact despite repeated imaging attempts. Brain: No restricted diffusion to suggest acute infarction. No midline shift, mass effect, evidence of mass lesion, ventriculomegaly, extra-axial collection or acute intracranial hemorrhage. Cervicomedullary junction and pituitary are within normal limits. Motion artifact on post-contrast images of the brain. Difficult to  exclude a 3-4 mm rim enhancing lesion along the medial right thalamus (series 12, image 27), although this might be artifact. There is no thalamic edema or mass effect although there might be mild T2 and FLAIR hyperintensity in the area of this lesion (series 7, image 12). No other convincing abnormal intracranial enhancement. No dural thickening identified. Scattered patchy bilateral cerebral white matter T2 and FLAIR hyperintensity, nonspecific and moderate for age. Similar patchy T2 hyperintensity in the pons. No cortical encephalomalacia or chronic cerebral blood products. Negative cerebellum. Vascular: Major intracranial vascular flow voids are preserved, dominant distal left vertebral artery and incidental pneumatized left anterior clinoid process. Skull and upper cervical spine: Normal visible bone marrow signal. Grossly negative visualized cervical spine. Sinuses/Orbits: Negative orbits soft tissues. Visualized paranasal sinuses  and mastoids are well pneumatized. Other: Visible internal auditory structures appear normal. Negative scalp soft tissues. MRA HEAD FINDINGS Vessel detail degraded by motion. Antegrade flow in the posterior circulation with dominant and dolichoectatic distal left vertebral artery. The distal right vertebral artery appears patent. Both PICA origins appear normal. Patent basilar artery without stenosis. Right posterior communicating artery is present while the left is diminutive or absent. SCA and PCA origins and bilateral PCA branches appear within normal limits. Antegrade flow in both ICA siphons. Mild ICA dolichoectasia. No ICA stenosis. Ophthalmic and right posterior communicating artery origins appear normal. Patent carotid termini. MCA and ACA origins appear within normal limits. The left A1 segment is dominant. Anterior communicating artery and visualized ACA branches are within normal limits. Bilateral MCA M1 segments and MCA bifurcations are patent. Visible bilateral MCA  branches are within normal limits. IMPRESSION: 1. Post-contrast images are degraded by motion. A subtle 3 mm rim-enhancing metastasis in the medial right thalamus is difficult to exclude but is favored to be artifactual. A repeat Brain MRI without and with contrast - done as an outpatient - to confirm and re-stage is recommended. 2. Otherwise no metastatic disease or acute intracranial abnormality. 3. Negative intracranial MRA aside from intracranial artery dolichoectasia. Electronically Signed   By: Genevie Ann M.D.   On: 07/26/2016 11:58   Ct Head Code Stroke Wo Contrast  Result Date: 07/25/2016 CLINICAL DATA:  Code stroke. Sudden onset left arm weakness with altered mental status. EXAM: CT HEAD WITHOUT CONTRAST TECHNIQUE: Contiguous axial images were obtained from the base of the skull through the vertex without intravenous contrast. COMPARISON:  02/19/2008 FINDINGS: Brain: No evidence of acute infarction, hemorrhage, hydrocephalus, extra-axial collection or mass lesion/mass effect. Mild chronic microvascular ischemic change seen in the cerebral white matter, most notable in the subcortical left frontal region. Vascular: Atherosclerotic calcification.  No hyperdense vessel. Skull: No acute or aggressive finding. Sinuses/Orbits: Negative Other: Text page with results were sent at the time of interpretation on 07/25/2016 at 8:33 pm to [place]. Awaiting call back. ASPECTS [facility] Stroke Program Early CT Score) - Ganglionic level infarction (caudate, lentiform nuclei, internal capsule, insula, M1-M3 cortex): 7 - Supraganglionic infarction (M4-M6 cortex): 3 Total score (0-10 with 10 being normal): 10 IMPRESSION: 1. No acute finding.ASPECTS is 10. 2. Mild chronic microvascular disease. Electronically Signed   By: Monte Fantasia M.D.   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 AFP tumor marker within normal limits makes this less likely hepatocellular carcinoma CEA level is elevated and 146.6 CA-19-9 level is borderline elevated at 41. MRI of the brain shows artifact versus early 3 mm metastatic lesion in the thalamus(less likely metastatic) 2) newly diagnosed acute pulmonary embolism in her right middle and right lower lobe. Currently on IV heparin. 3) poor functional status ECOG performance status of 3-4 4) polymyalgia rheumatica 5) gluteal skin breakdown prior to admission. 6) significant anxiety issues. 7)normocytic normochromic anemia - likely due to malignancy. Hemoglobin is trending downwards cannot rule out blood loss. PLAN -I discussed imaging findings and pathology results in details with the patient and her daughter at bedside. -We discussed the diagnosis, grave prognosis and possible treatment options  including palliative chemotherapy versus best supportive cares. -After extensive discussion and with an understanding that her performance status is poor and she has a significant burden of medical comorbidities it was thought that palliative chemotherapy would be unlikely to improve her quality of life or be tolerated reasonably. -Patient has chosen and I would certainly support that recommendation to proceed with best supportive cares with home hospice services. Patient's questionable brain lesion is asymptomatic at this time. If this were to produce limiting symptoms in the future might need to consider palliative radiation. -Anticoagulation as per hospitalist.  Would ideally require Lovenox on discharge. However since goals of care are palliative/supportive cares may consider NOAC's if liver and renal  function fairly stable. -Might consider transfusion for symptomatically anemia to help with her quality of life prior to discharge. -Appreciate excellent cares by the Hospital medicine and palliative care team .   All of the patients questions were answered with apparent satisfaction. The patient knows to call the clinic with any problems, questions or concerns.  I spent 35 minutes counseling the patient face to face. The total time spent in the appointment was 45 minutes and more than 50% was on counseling and direct patient cares.    Sullivan Lone MD [place] AAHIVMS [facility] Hematology/Oncology Physician [facility]  (Office):       [phone] (Work cell):  [phone] (Fax):           [phone]

## 2016-07-28 NOTE — Discharge Summary (Signed)
Physician Discharge Summary  [name] [id number] DOB: [birth year] DOA: 07/19/2016  PCP: Lynn Pardon, MD  Admit date: 07/19/2016 Discharge date: 07/28/2016  Admitted From: Home Disposition:  Home with hospice  Recommendations for Outpatient Follow-up:  1. Follow up with Hospice for hospice needs  [place] controlled substance registry reviewed: New prescriptions for known narcotics given for palliative care purposes. New doses noted per Hospice recommendations  Discharge Condition:Stable CODE STATUS:DNR Diet recommendation: Regular   Brief/Interim Summary: 80 year old female with history of A. fib on anticoagulation, polymyalgia rheumatica on low-dose prednisone, IBS, COPD, MGUS (follows with Dr. Irene Hunter) presented with abdominal discomfort for last few days. She has also had almost 20 pound weight loss in the past 3 months. In the ED CT of the abdomen and pelvis showed a large liver lesion with extensive periportal adenopathy. Also showed gallbladder with sludge with questionable mass within the gallbladder and thickened wall. A CT angiogram of the chest done was positive for acute PE in the right middle and lower lobes. (Also showed a 1 cm left lower lobe pulmonary nodule).  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 -CT-guided liver biopsy done on 12/1. poorly differentiated carcinoma either pancreaticobiliary or upper gastrointestinal primary. Cannot r/o cholangiocarcinoma or hepatobiliary primary.  -AFP negative and CEA markedly elevated (146). Appreciate oncology consult. Given nature of malignancy with possibly thalamic mets, co morbidities she si a poor candidate for treatment options. Discussed hospice options and both patient an daughter open for home  hospice.  palliative care involved with symptomatic management. Patient wishes to go home with hospice care. Discussed with case manager, who has arranged hospice at home     Pulmonary embolism without acute cor pulmonale ([place])  Patient was on Xarelto at home but due to cost issues has not been able to take them. Able to afford lovenox, so transitioned to it. Will d/c on once daily lovenox.  Hemoccult-positive. H&H Remains stable. -appreciated GI eval. No intervention needed.   Active Problems: TIA versus acute metabolic encephalopathy on 12/3. Code stroke called. Neurology consult appreciated. Stat head CT negative for infarct or bleeding. MRI brain negative for stroke but shows a possible subtle 3 mm rim-enhancing metastasis in the medial right thalamus. Carotid Doppler without significant stenosis. 2-D echo pending. No further neurological deficit.  Neurology consult appreciated.   Persistent abdominal pain Palliative care consult appreciated for pain management. Switched MS Contin to every 8 hours, continue oral oxycodone for breakthrough pain and IV Dilaudid. Added bowel regimen.   Anemia of chronic disease  Hemoccult positive. No need for colonoscopy at present per GI. H&H stable.   Hypothyroidism Continue Synthroid.  Chronic A. fib Rate controlled with metoprolol. On therapeutic Lovenox.    PMR (polymyalgia rheumatica) (HCC) Continue low-dose prednisone. Adjusted home MS Contin.  Transaminitis LFTs trending up possibly due to liver metastases.   Discharge Diagnoses:  Principal Problem:   Pulmonary embolism without acute cor pulmonale (HCC) Active Problems:   Essential hypertension   Depression   PMR (polymyalgia rheumatica) (HCC)   Hypothyroidism   Gallbladder mass   Cholangiocarcinoma metastatic to liver [facility])   Liver mass   Encounter for palliative care   Right upper quadrant abdominal pain   Abdominal pain, right upper quadrant   Acute  left-sided weakness   Goals of care, counseling/discussion   Stroke-like symptom   Nausea  and vomiting    Discharge Instructions  Discharge Instructions    Ambulatory referral to Neurology    Complete by:  As directed    An appointment is requested in approximately: 2 weeks  For neurocognitive decline.--making mistakes in bills, forgetting where they are, driving getting lost. Both husband and patient live alone and have same issue.       Medication List    TAKE these medications   enoxaparin 150 MG/ML injection Commonly known as:  LOVENOX Inject 1 mL (150 mg total) into the skin daily. Please dispense one month supply, zero refills   levothyroxine 88 MCG tablet Commonly known as:  SYNTHROID, LEVOTHROID Take 1 tablet (88 mcg total) by mouth daily.   meclizine 25 MG tablet Commonly known as:  ANTIVERT TAKE 1 TABLET (25 MG TOTAL) BY MOUTH 3 (THREE) TIMES DAILY AS NEEDED FOR DIZZINESS.   metoprolol succinate 50 MG 24 hr tablet Commonly known as:  TOPROL-XL Take 3 tablets (150 mg total) by mouth daily.   morphine 30 MG 12 hr tablet Commonly known as:  MS CONTIN Take 1 tablet (30 mg total) by mouth at bedtime. What changed:  Another medication with the same name was added. Make sure you understand how and when to take each.  Another medication with the same name was changed. Make sure you understand how and when to take each.   morphine CONCENTRATE 10 MG/0.5ML Soln concentrated solution Take 0.5-0.75 mLs (10-15 mg total) by mouth every 4 (four) hours as needed for moderate pain or severe pain (Give 10mg  for pain 4-7/10, give 15mg  for pain 8-10/10 on pain scale). What changed:  You were already taking a medication with the same name, and this prescription was added. Make sure you understand how and when to take each.   morphine 15 MG 12 hr tablet Commonly known as:  MS CONTIN Take 1 tablet (15 mg total) by mouth daily. Start taking on:  07/29/2016 What changed:  when to  take this   ondansetron 4 MG disintegrating tablet Commonly known as:  ZOFRAN-ODT Take 1 tablet (4 mg total) by mouth every 8 (eight) hours as needed for nausea or vomiting.   pantoprazole 40 MG tablet Commonly known as:  PROTONIX TAKE 1 TABLET (40 MG TOTAL) BY MOUTH DAILY.   predniSONE 1 MG tablet Commonly known as:  DELTASONE TAKE 4 TABLETS BY MOUTH DAILLY IN THE MORNING IN [place], 3 DAILY SEPTEMBER, 2 DAILY OCTOBER What changed:  See the new instructions.   senna 8.6 MG Tabs tablet Commonly known as:  SENOKOT Take 1 tablet (8.6 mg total) by mouth daily. Start taking on:  07/29/2016   sertraline 50 MG tablet Commonly known as:  ZOLOFT TAKE 1 TABLET BY MOUTH EVERY DAY What changed:  See the new instructions.   trolamine salicylate 10 % cream Commonly known as:  ASPERCREME Apply 1 application topically 2 (two) times daily as needed for muscle pain.       Allergies  Allergen Reactions  . Amoxicillin-Pot Clavulanate Diarrhea    Has patient had a PCN reaction causing immediate rash, facial/tongue/throat swelling, SOB or lightheadedness with hypotension: No Has patient had a PCN reaction causing severe rash involving mucus membranes or skin necrosis: No Has patient had a PCN reaction that required hospitalization No Has patient had a PCN reaction occurring within the last 10 years: Yes If all of the above answers are "NO", then may proceed with Cephalosporin use.   . Aspirin Nausea And Vomiting  .  Atorvastatin     REACTION: feel weak  . Carisoprodol     REACTION: intolerant  . Codeine     REACTION: hallucinations  . Codeine Phosphate     REACTION: UNKNOWN  . Cortisone     REACTION: pt. reports allergy, reaction not known  . Duoderm Hydroactive     Skin irritation and pain     . Ezetimibe-Simvastatin     REACTION: feels weak  . Naproxen Sodium     REACTION: GI symptoms  . Omeprazole     REACTION: abd pain  . Quinapril Hcl     REACTION: reports allergy, reaction  not known  . Rofecoxib     REACTION: reports allergy, reaction not known  . Silvadene [Silver Sulfadiazine]     Skin irritation and pain   . Xarelto [Rivaroxaban] Other (See Comments)    Pain     Consultations:  Oncology  Palliative Care  GI  Cardiology  Procedures/Studies: Ct Angio Chest Pe W And/or Wo Contrast  Result Date: 07/19/2016 CLINICAL DATA:  Abnormal CT abdomen and pelvis.  Abdominal pain. EXAM: CT ANGIOGRAPHY CHEST WITH CONTRAST TECHNIQUE: Multidetector CT imaging of the chest was performed using the standard protocol during bolus administration of intravenous contrast. Multiplanar CT image reconstructions and MIPs were obtained to evaluate the vascular anatomy. CONTRAST:  100 cc Isovue 370 COMPARISON:  07/04/2016 FINDINGS: Cardiovascular: There is low-density filling defect within the right pulmonary artery segmental branch of the lateral basal segment of the right lower lobe. It extends into subsegmental branches. This is compatible with pulmonary thromboembolism. There is also segmental and subsegmental pulmonary thromboembolism at the base of the right middle lobe. There are no central filling defects in the pulmonary arterial tree. No evidence of left pulmonary thromboembolism. Coronary artery calcifications. Mediastinum/Nodes: No abnormal mediastinal adenopathy. Right infrahilar lymphoid tissue there is stable and mildly prominent. Lungs/Pleura: Advanced apical centrilobular emphysema. There is a questionable nodular density in the superior segment of the left lower lobe on image 42 of series 14, measuring 1.0 cm. There is scattered volume loss in the lungs. Dependent atelectasis bilaterally. No pneumothorax. No pleural effusion. Upper Abdomen: Simple cysts in the left kidney. Musculoskeletal: No vertebral compression deformity. Mild scoliosis of the thoracic spine. Review of the MIP images confirms the above findings. IMPRESSION: The study is positive for acute pulmonary  thromboembolism in the right middle and lower lobes. This affects segmental and subsegmental branches. Possible 1.0 cm left lower lobe pulmonary nodule. Electronically Signed   By: Marybelle Killings M.D.   On: 07/19/2016 13:36   Ct Angio Chest Pe W And/or Wo Contrast  Result Date: 07/04/2016 CLINICAL DATA:  Positive D-dimer. History of hypertension and arrhythmias. EXAM: CT ANGIOGRAPHY CHEST WITH CONTRAST TECHNIQUE: Multidetector CT imaging of the chest was performed using the standard protocol during bolus administration of intravenous contrast. Multiplanar CT image reconstructions and MIPs were obtained to evaluate the vascular anatomy. CONTRAST:  100 mL Isovue 370 COMPARISON:  None. FINDINGS: Cardiovascular: Examination of the pulmonary arteries is somewhat limited by contrast bolus and motion artifact but there is no definite evidence of significant pulmonary embolus. Normal heart size. No pericardial effusion. Calcification of the aorta and coronary arteries. Mediastinum/Nodes: No enlarged mediastinal, hilar, or axillary lymph nodes. Thyroid gland, trachea, and esophagus demonstrate no significant findings. Lungs/Pleura: Atelectasis in the lung bases. Emphysematous changes particularly in the upper lungs. Slight fibrosis. No focal consolidation. No pleural effusions. No pneumothorax. Upper Abdomen: The appearance of the liver suggests hepatic cirrhosis.  The lateral segment of the left lobe is enlarged and there appears to be mild lobulation to the contour. The gallbladder is distended and filled with mildly hyperechoic material, likely representing sludge although small stones or milk of calcium could also have this appearance. No gallbladder wall thickening. No bile duct dilatation. Musculoskeletal: No chest wall abnormality. No acute or significant osseous findings. Review of the MIP images confirms the above findings. IMPRESSION: No evidence of significant pulmonary embolus. Emphysematous changes, fibrosis,  and atelectasis in the lungs. Aortic atherosclerosis. Electronically Signed   By: Lucienne Capers M.D.   On: 07/04/2016 05:18   Mr Jeri Cos [id number] Contrast  Result Date: 07/26/2016 CLINICAL DATA:  80 year old female with new onset confusion. Liver mass. Possible gallbladder mass. Query metastatic disease. Initial encounter. EXAM: MRI HEAD WITHOUT AND WITH CONTRAST MRA HEAD WITHOUT CONTRAST TECHNIQUE: Multiplanar, multiecho pulse sequences of the brain and surrounding structures were obtained without and with intravenous contrast. Angiographic images of the head were obtained using MRA technique without contrast. CONTRAST:  35mL MULTIHANCE GADOBENATE DIMEGLUMINE 529 MG/ML IV SOLN COMPARISON:  Head CT without contrast 02/19/2008. FINDINGS: MRI HEAD FINDINGS Study is intermittently degraded by motion artifact despite repeated imaging attempts. Brain: No restricted diffusion to suggest acute infarction. No midline shift, mass effect, evidence of mass lesion, ventriculomegaly, extra-axial collection or acute intracranial hemorrhage. Cervicomedullary junction and pituitary are within normal limits. Motion artifact on post-contrast images of the brain. Difficult to exclude a 3-4 mm rim enhancing lesion along the medial right thalamus (series 12, image 27), although this might be artifact. There is no thalamic edema or mass effect although there might be mild T2 and FLAIR hyperintensity in the area of this lesion (series 7, image 12). No other convincing abnormal intracranial enhancement. No dural thickening identified. Scattered patchy bilateral cerebral white matter T2 and FLAIR hyperintensity, nonspecific and moderate for age. Similar patchy T2 hyperintensity in the pons. No cortical encephalomalacia or chronic cerebral blood products. Negative cerebellum. Vascular: Major intracranial vascular flow voids are preserved, dominant distal left vertebral artery and incidental pneumatized left anterior clinoid process. Skull  and upper cervical spine: Normal visible bone marrow signal. Grossly negative visualized cervical spine. Sinuses/Orbits: Negative orbits soft tissues. Visualized paranasal sinuses and mastoids are well pneumatized. Other: Visible internal auditory structures appear normal. Negative scalp soft tissues. MRA HEAD FINDINGS Vessel detail degraded by motion. Antegrade flow in the posterior circulation with dominant and dolichoectatic distal left vertebral artery. The distal right vertebral artery appears patent. Both PICA origins appear normal. Patent basilar artery without stenosis. Right posterior communicating artery is present while the left is diminutive or absent. SCA and PCA origins and bilateral PCA branches appear within normal limits. Antegrade flow in both ICA siphons. Mild ICA dolichoectasia. No ICA stenosis. Ophthalmic and right posterior communicating artery origins appear normal. Patent carotid termini. MCA and ACA origins appear within normal limits. The left A1 segment is dominant. Anterior communicating artery and visualized ACA branches are within normal limits. Bilateral MCA M1 segments and MCA bifurcations are patent. Visible bilateral MCA branches are within normal limits. IMPRESSION: 1. Post-contrast images are degraded by motion. A subtle 3 mm rim-enhancing metastasis in the medial right thalamus is difficult to exclude but is favored to be artifactual. A repeat Brain MRI without and with contrast - done as an outpatient - to confirm and re-stage is recommended. 2. Otherwise no metastatic disease or acute intracranial abnormality. 3. Negative intracranial MRA aside from intracranial artery dolichoectasia. Electronically Signed  By: Genevie Ann M.D.   On: 07/26/2016 11:58   Mr Abdomen W Wo Contrast  Result Date: 07/21/2016 CLINICAL DATA:  Followup abnormal CT scan of the abdomen demonstrating a gallbladder mass and hepatic metastatic disease. EXAM: MRI ABDOMEN WITHOUT AND WITH CONTRAST TECHNIQUE:  Multiplanar multisequence MR imaging of the abdomen was performed both before and after the administration of intravenous contrast. CONTRAST:  27mL MULTIHANCE GADOBENATE DIMEGLUMINE 529 MG/ML IV SOLN COMPARISON:  CT scan 07/19/2016 FINDINGS: Lower chest: The lung bases are grossly clear. No pleural or pericardial effusion. Hepatobiliary: Numerous hepatic lesions are again demonstrated. The largest lesion is in segment 5 and measures 49 mm. There are 3 lesions and segment 4B the largest 2 lesions measure 26 mm and a smaller lesion measures 12 mm. These are all diffusion positive consistent with neoplasm. Enhancing tumor in the fundal region of the gallbladder is also diffusion positive and consistent with gallbladder cancer with hepatic metastatic disease. There are also enlarged periportal and celiac axis lymph nodes as demonstrated on the CT scan which are diffusion positive. Pancreas: No mass, inflammation or ductal dilatation. Peripancreatic adenopathy is again demonstrated. Spleen:  Normal size.  No focal lesions. Adrenals/Urinary Tract: The adrenal glands and kidneys are unremarkable. Stomach/Bowel: The stomach, duodenum, visualized small bowel and visualized colon are unremarkable. Vascular/Lymphatic: Advanced atherosclerotic calcifications involving the aorta and iliac arteries. No aneurysm. Enlarged metastatic periportal, celiac axis, peripancreatic and retroperitoneal lymph nodes. Other:  No ascites or abdominal wall hernia. Musculoskeletal: No significant bony findings IMPRESSION: 1. Enhancing and diffusion positive tumor in the gallbladder fundus consistent with gallbladder carcinoma. 2. Hepatic metastatic disease and upper abdominal metastatic lymphadenopathy. Electronically Signed   By: Marijo Sanes M.D.   On: 07/21/2016 15:02   Ct Abdomen Pelvis W Contrast  Result Date: 07/19/2016 CLINICAL DATA:  Abdominal pain EXAM: CT ABDOMEN AND PELVIS WITH CONTRAST TECHNIQUE: Multidetector CT imaging of the  abdomen and pelvis was performed using the standard protocol following bolus administration of intravenous contrast. Oral contrast was also administered. CONTRAST:  153mL ISOVUE-300 IOPAMIDOL (ISOVUE-300) INJECTION 61% COMPARISON:  CT abdomen and pelvis July 24, 2014 FINDINGS: Lower chest: There is bibasilar lung atelectasis. Heart is mildly enlarged. There is a focal area of decreased attenuation in the region of the left ventricle seen on axial slice 1 series 2 measuring 1.5 x 1.4 cm. There is a questionable pulmonary embolus in a right lower lobe pulmonary artery seen on axial slice 1 series 2. Hepatobiliary: There is an infiltrative appearing mass in the anterior segment of the right lobe of the liver measuring 7.7 x 4.9 cm. No other focal liver lesion is evident. There is extensive sludge in the gallbladder. An underlying mass in the gallbladder cannot be excluded. The gallbladder wall appears rather prominent by CT. There is no appreciable biliary duct dilatation. Pancreas: No pancreatic mass or inflammatory focus evident. Spleen: No splenic lesions are evident. Adrenals/Urinary Tract: Adrenals appear unremarkable bilaterally. There are two cysts arising from the upper pole of the left kidney eccentrically measuring just over 1 cm in size each. No non cystic liver lesions are evident. There is no hydronephrosis on either side. There is fetal lobulation on each side, an anatomic variant. There is no appreciable renal or ureteral calculus on either side. Urinary bladder is midline with wall thickness within normal limits. Stomach/Bowel: There are scattered colonic diverticula, primarily in the sigmoid region without diverticulitis. There is fairly diffuse stool throughout much of the colon. There is a right-sided femoral  hernia which contains small bowel but no bowel compromise. There is no bowel dilatation or air-fluid level suggesting bowel obstruction. No free air or portal venous air is evident.  Vascular/Lymphatic: There is extensive atherosclerotic calcification in the aorta and major pelvic arterial vessels diffusely. No aneurysms are evident. Major mesenteric arterial vessels appear patent with moderate atherosclerotic calcification noted, particularly at the origin of the left renal artery. There is periportal region adenopathy. The largest of the periportal lymph nodes measures 4.4 x 2.3 cm. There is no adenopathy distant to the periportal region. Reproductive: Uterus appears absent. No pelvic mass or pelvic fluid collection evident. Other: Appendix not appreciable. No periappendiceal region inflammation. No abscess or ascites evident in the abdomen or pelvis. Musculoskeletal: There is extensive degenerative change in the lumbar spine. There is mild anterolisthesis of L4 on L5 and mild anterolisthesis of L5 on S1, findings present previously. There are no blastic or lytic bone lesions. There is no intramuscular or abdominal wall lesion. IMPRESSION: Large liver lesion felt to represent neoplastic focus. There is extensive periportal adenopathy. The gallbladder contains sludge with questionable mass within the gallbladder. Gallbladder wall appears borderline thickened. These findings raise concern for possible carcinoma arising from the gallbladder with liver metastasis and periportal adenopathy. Primary liver carcinoma is a differential consideration. Given the proclivity of colon carcinoma to metastasize to the liver, it may be prudent to consider direct visualization of the colon in this regard. Questionable pulmonary embolus in a right lower lobe pulmonary artery branch. This finding may warrant CT angiogram of the chest for further assessment. Questionable lesion in the left ventricle. This finding may warrant echocardiography to further assess. There is extensive aortic atherosclerosis as well as atherosclerosis throughout the major pelvic vessels. No aneurysm evident. Right-sided femoral hernia  containing small bowel but no bowel compromise. Critical Value/emergent results were called by telephone at the time of interpretation on 07/19/2016 at 10:09 am to [place] , who verbally acknowledged these results. Electronically Signed   By: Lowella Grip III M.D.   On: 07/19/2016 10:10   US Biopsy  Result Date: 07/23/2016 INDICATION: GALLBLADDER MASS WITH HEPATIC LESIONS COMPATIBLE WITH METASTATIC DISEASE. EXAM: ULTRASOUND BIOPSY CORE LIVER MEDICATIONS: 1% LIDOCAINE LOCALLY ANESTHESIA/SEDATION: Moderate (conscious) sedation was employed during this procedure. A total of Versed 1.5 mg and Fentanyl 37.5 mcg was administered intravenously. Moderate Sedation Time: 7 minutes. The patient's level of consciousness and vital signs were monitored continuously by radiology nursing throughout the procedure under my direct supervision. FLUOROSCOPY TIME:  Fluoroscopy Time: NONE. COMPLICATIONS: COMPLICATIONS None immediate. PROCEDURE: Informed written consent was obtained from the patient after a thorough discussion of the procedural risks, benefits and alternatives. All questions were addressed. Maximal Sterile Barrier Technique was utilized including caps, mask, sterile gowns, sterile gloves, sterile drape, hand hygiene and skin antiseptic. A timeout was performed prior to the initiation of the procedure. Previous imaging reviewed. The right hepatic mass adjacent to the gallbladder was localized. Overlying skin marked. Under sterile conditions and local anesthesia, a 17 gauge 6.8 cm access needle was advanced percutaneously into the right hepatic mass. Needle position confirmed with ultrasound. Images obtained for documentation. 3 18 gauge core biopsies obtained. Needle removed. Postprocedure imaging demonstrates no hemorrhage or hematoma. Samples placed in formalin. Patient tolerated the biopsy well. IMPRESSION: Successful ultrasound right hepatic mass 18 gauge core biopsy Electronically Signed   By:  Jerilynn Mages.  Shick M.D.   On: 07/23/2016 12:43   Dg Chest [facility] 1 View  Result  Date: 07/04/2016 CLINICAL DATA:  Chest pain tonight.  Bradycardia. EXAM: PORTABLE CHEST 1 VIEW COMPARISON:  06/17/2016 FINDINGS: Shallow inspiration. Linear atelectasis in the lung bases. No focal airspace disease or consolidation. No blunting of costophrenic angles. No pneumothorax. Calcification of the aorta. Degenerative changes in the spine and shoulders. IMPRESSION: Linear atelectasis in the lung bases.  No focal consolidation. Electronically Signed   By: Lucienne Capers M.D.   On: 07/04/2016 02:04   Mr Jodene Nam Headm  Result Date: 07/26/2016 CLINICAL DATA:  80 year old female with new onset confusion. Liver mass. Possible gallbladder mass. Query metastatic disease. Initial encounter. EXAM: MRI HEAD WITHOUT AND WITH CONTRAST MRA HEAD WITHOUT CONTRAST TECHNIQUE: Multiplanar, multiecho pulse sequences of the brain and surrounding structures were obtained without and with intravenous contrast. Angiographic images of the head were obtained using MRA technique without contrast. CONTRAST:  12mL MULTIHANCE GADOBENATE DIMEGLUMINE 529 MG/ML IV SOLN COMPARISON:  Head CT without contrast 02/19/2008. FINDINGS: MRI HEAD FINDINGS Study is intermittently degraded by motion artifact despite repeated imaging attempts. Brain: No restricted diffusion to suggest acute infarction. No midline shift, mass effect, evidence of mass lesion, ventriculomegaly, extra-axial collection or acute intracranial hemorrhage. Cervicomedullary junction and pituitary are within normal limits. Motion artifact on post-contrast images of the brain. Difficult to exclude a 3-4 mm rim enhancing lesion along the medial right thalamus (series 12, image 27), although this might be artifact. There is no thalamic edema or mass effect although there might be mild T2 and FLAIR hyperintensity in the area of this lesion (series 7, image 12). No other convincing abnormal intracranial  enhancement. No dural thickening identified. Scattered patchy bilateral cerebral white matter T2 and FLAIR hyperintensity, nonspecific and moderate for age. Similar patchy T2 hyperintensity in the pons. No cortical encephalomalacia or chronic cerebral blood products. Negative cerebellum. Vascular: Major intracranial vascular flow voids are preserved, dominant distal left vertebral artery and incidental pneumatized left anterior clinoid process. Skull and upper cervical spine: Normal visible bone marrow signal. Grossly negative visualized cervical spine. Sinuses/Orbits: Negative orbits soft tissues. Visualized paranasal sinuses and mastoids are well pneumatized. Other: Visible internal auditory structures appear normal. Negative scalp soft tissues. MRA HEAD FINDINGS Vessel detail degraded by motion. Antegrade flow in the posterior circulation with dominant and dolichoectatic distal left vertebral artery. The distal right vertebral artery appears patent. Both PICA origins appear normal. Patent basilar artery without stenosis. Right posterior communicating artery is present while the left is diminutive or absent. SCA and PCA origins and bilateral PCA branches appear within normal limits. Antegrade flow in both ICA siphons. Mild ICA dolichoectasia. No ICA stenosis. Ophthalmic and right posterior communicating artery origins appear normal. Patent carotid termini. MCA and ACA origins appear within normal limits. The left A1 segment is dominant. Anterior communicating artery and visualized ACA branches are within normal limits. Bilateral MCA M1 segments and MCA bifurcations are patent. Visible bilateral MCA branches are within normal limits. IMPRESSION: 1. Post-contrast images are degraded by motion. A subtle 3 mm rim-enhancing metastasis in the medial right thalamus is difficult to exclude but is favored to be artifactual. A repeat Brain MRI without and with contrast - done as an outpatient - to confirm and re-stage is  recommended. 2. Otherwise no metastatic disease or acute intracranial abnormality. 3. Negative intracranial MRA aside from intracranial artery dolichoectasia. Electronically Signed   By: Genevie Ann M.D.   On: 07/26/2016 11:58   Ct Head Code Stroke Wo Contrast  Result Date: 07/25/2016 CLINICAL DATA:  Code stroke. Sudden onset  left arm weakness with altered mental status. EXAM: CT HEAD WITHOUT CONTRAST TECHNIQUE: Contiguous axial images were obtained from the base of the skull through the vertex without intravenous contrast. COMPARISON:  02/19/2008 FINDINGS: Brain: No evidence of acute infarction, hemorrhage, hydrocephalus, extra-axial collection or mass lesion/mass effect. Mild chronic microvascular ischemic change seen in the cerebral white matter, most notable in the subcortical left frontal region. Vascular: Atherosclerotic calcification.  No hyperdense vessel. Skull: No acute or aggressive finding. Sinuses/Orbits: Negative Other: Text page with results were sent at the time of interpretation on 07/25/2016 at 8:33 pm to [place]. Awaiting call back. ASPECTS [facility] Stroke Program Early CT Score) - Ganglionic level infarction (caudate, lentiform nuclei, internal capsule, insula, M1-M3 cortex): 7 - Supraganglionic infarction (M4-M6 cortex): 3 Total score (0-10 with 10 being normal): 10 IMPRESSION: 1. No acute finding.ASPECTS is 10. 2. Mild chronic microvascular disease. Electronically Signed   By: Monte Fantasia M.D.   On: 07/25/2016 20:36    Subjective: No complaints. Eager to go home  Discharge Exam: Vitals:   07/27/16 2107 07/28/16 0531  BP: 132/76 115/70  Pulse: 75 79  Resp: 18 18  Temp: 99.1 F (37.3 C) 98.4 F (36.9 C)   Vitals:   07/27/16 0930 07/27/16 1353 07/27/16 2107 07/28/16 0531  BP: 118/60 (!) 123/56 132/76 115/70  Pulse: (!) 119 71 75 79  Resp:  18 18 18   Temp:  97.8 F (36.6 C) 99.1 F (37.3 C) 98.4 F (36.9 C)  TempSrc:  Oral Oral Oral  SpO2:  96% 96% 90%  Weight:       Height:        General: Pt is alert, awake, not in acute distress Cardiovascular: RRR, S1/S2 +, no rubs, no gallops Respiratory: CTA bilaterally, no wheezing, no rhonchi Abdominal: Soft, NT, ND, bowel sounds + Extremities: no edema, no cyanosis   The results of significant diagnostics from this hospitalization (including imaging, microbiology, ancillary and laboratory) are listed below for reference.     Microbiology: No results found for this or any previous visit (from the past 240 hour(s)).   Labs: BNP (last 3 results)  Recent Labs  09/22/15 0952 07/04/16 0056  BNP 130.2* 0000000*   Basic Metabolic Panel:  Recent Labs Lab 07/23/16 0439 07/26/16 0440  NA 136 134*  K 3.8 4.2  CL 101 99*  CO2 27 28  GLUCOSE 106* 98  BUN 15 14  CREATININE 0.84 0.59  CALCIUM 8.8* 8.8*   Liver Function Tests:  Recent Labs Lab 07/23/16 0439 07/24/16 0527 07/26/16 0440  AST 155* 80* 186*  ALT 89* 69* 138*  ALKPHOS 151* 159* 236*  BILITOT 0.3 1.0 1.1  PROT 6.3* 7.3 5.9*  ALBUMIN 2.8* 3.0* 2.4*   No results for input(s): LIPASE, AMYLASE in the last 168 hours. No results for input(s): AMMONIA in the last 168 hours. CBC:  Recent Labs Lab 07/22/16 0521 07/23/16 0439 07/24/16 0527 07/25/16 0519 07/28/16 0519  WBC 6.1 6.9 9.4 5.7 6.9  NEUTROABS  --  3.9  --   --   --   HGB 7.9* 8.2* 8.9* 7.9* 7.5*  HCT 25.3* 26.6* 29.6* 25.9* 24.4*  MCV 90.4 90.8 90.5 90.9 89.1  PLT 273 314 332 276 296   Cardiac Enzymes: No results for input(s): CKTOTAL, CKMB, CKMBINDEX, TROPONINI in the last 168 hours. BNP: Invalid input(s): POCBNP CBG: No results for input(s): GLUCAP in the last 168 hours. D-Dimer No results for input(s): DDIMER in the last 72 hours. Hgb  A1c No results for input(s): HGBA1C in the last 72 hours. Lipid Profile  Recent Labs  07/27/16 0425  CHOL 155  HDL 34*  LDLCALC 100*  TRIG 107  CHOLHDL 4.6   Thyroid function studies No results for input(s): TSH,  T4TOTAL, T3FREE, THYROIDAB in the last 72 hours.  Invalid input(s): FREET3 Anemia work up No results for input(s): VITAMINB12, FOLATE, FERRITIN, TIBC, IRON, RETICCTPCT in the last 72 hours. Urinalysis    Component Value Date/Time   COLORURINE YELLOW 07/19/2016 1037   APPEARANCEUR CLEAR 07/19/2016 1037   LABSPEC 1.027 07/19/2016 1037   LABSPEC 1.025 07/22/2015 1216   PHURINE 5.5 07/19/2016 1037   GLUCOSEU NEGATIVE 07/19/2016 1037   GLUCOSEU Negative 07/22/2015 1216   HGBUR NEGATIVE 07/19/2016 1037   HGBUR trace-lysed 12/27/2006 1459   BILIRUBINUR NEGATIVE 07/19/2016 1037   BILIRUBINUR Negative 07/22/2015 1216   KETONESUR NEGATIVE 07/19/2016 1037   PROTEINUR NEGATIVE 07/19/2016 1037   UROBILINOGEN 0.2 07/22/2015 1216   NITRITE NEGATIVE 07/19/2016 1037   LEUKOCYTESUR NEGATIVE 07/19/2016 1037   LEUKOCYTESUR Negative 07/22/2015 1216   Sepsis Labs Invalid input(s): PROCALCITONIN,  WBC,  LACTICIDVEN Microbiology No results found for this or any previous visit (from the past 240 hour(s)).   SIGNED:   Donne Hazel, MD  [facility] 07/28/2016, 12:36 PM  If 7PM-7AM, please contact night-coverage [email or web address] Password [email or web address]

## 2016-07-28 NOTE — Progress Notes (Signed)
                                                                                                                                                                                                         Daily Progress Note   Patient Name: [name]       Date: 07/28/2016 DOB: 11/29/1935  Age: 80 y.o. MRN#: [id number] Attending Physician: Stephen K Chiu, MD Primary Care Physician: Marne Tower, MD Admit Date: 07/19/2016  Reason for Consultation/Follow-up: Establishing goals of care  Subjective: I met [name] at her bedside, her daughter Kay had not yet arrived. [name] was much more interactive compared to yesterday. She reports feeling better this morning with no further nausea or vomiting since yesterday (brought on after she took Oxycodone). She has not taken her MS Contin since 12/4, and reports pain well controlled at present. For pain, she has utilized her PRN Roxanol, and received one dose of IV dilaudid. She had no nausea with either of these medications. Per her report, she was able to meet with Oncology last night, and has decided to go home with Hospice support.   Length of Stay: 9  Current Medications: Scheduled Meds:  . enoxaparin (LOVENOX) injection  150 mg Subcutaneous Q24H  . levothyroxine  88 mcg Oral QAC breakfast  . metoprolol succinate  150 mg Oral Daily  . morphine  30 mg Oral Q8H  . pantoprazole  40 mg Oral BID  . predniSONE  1 mg Oral Q breakfast  . sertraline  50 mg Oral Daily  . sodium chloride flush  3 mL Intravenous Q12H  . sodium chloride flush  3 mL Intravenous Q12H    Continuous Infusions:   PRN Meds: sodium chloride, HYDROmorphone (DILAUDID) injection, meclizine, menthol-cetylpyridinium, morphine CONCENTRATE, ondansetron (ZOFRAN) IV, promethazine, senna, sodium chloride flush  Physical Exam  Constitutional: She is oriented to person, place, and time. No distress.  HENT:  Head: Normocephalic and atraumatic.  Mouth/Throat:  Oropharynx is clear and moist.  Eyes: EOM are normal.  Neck: Normal range of motion.  Cardiovascular: Normal rate.   Pulmonary/Chest: Effort normal.  Abdominal: Soft. Bowel sounds are normal.  Musculoskeletal:  Generalized weakness and appears deconditioned  Neurological: She is oriented to person, place, and time.  Fully alert, oriented, and conversational.  Skin: Skin is warm and dry. There is pallor.  Psychiatric: She has a normal mood and affect. Her speech is normal. Judgment and thought content normal. She is slowed.           Vital Signs: BP 115/70 (  BP Location: Right Arm)   Pulse 79   Temp 98.4 F (36.9 C) (Oral)   Resp 18   Ht 5' 7" (1.702 m)   Wt 96.6 kg (213 lb)   SpO2 90%   BMI 33.36 kg/m  SpO2: SpO2: 90 % O2 Device: O2 Device: Not Delivered O2 Flow Rate: O2 Flow Rate (L/min): 1 L/min  Intake/output summary:  No intake or output data in the 24 hours ending 07/28/16 0738 LBM: Last BM Date: 07/27/16 Baseline Weight: Weight: 96.6 kg (213 lb) Most recent weight: Weight: 96.6 kg (213 lb)  Palliative Assessment/Data: PPS 50%   Flowsheet Rows   Flowsheet Row Most Recent Value  Intake Tab  Referral Department  Hospitalist  Unit at Time of Referral  Med/Surg Unit  Palliative Care Primary Diagnosis  Cancer  Date Notified  07/24/16  Palliative Care Type  Return patient Palliative Care  Reason for referral  Pain, Non-pain Symptom, Clarify Goals of Care, Counsel Regarding Hospice  Date of Admission  07/19/16  Date first seen by Palliative Care  07/24/16  # of days Palliative referral response time  0 Day(s)  # of days IP prior to Palliative referral  5  Clinical Assessment  Palliative Performance Scale Score  30%  Pain Max last 24 hours  4  Pain Min Last 24 hours  3  Dyspnea Max Last 24 Hours  4  Dyspnea Min Last 24 hours  3  Nausea Max Last 24 Hours  3  Nausea Min Last 24 Hours  2  Psychosocial & Spiritual Assessment  Palliative Care Outcomes    Patient/Family meeting held?  Yes  Who was at the meeting?  daughter Kay   Palliative Care Outcomes  Clarified goals of care      Patient Active Problem List   Diagnosis Date Noted  . Nausea and vomiting   . Acute left-sided weakness   . Goals of care, counseling/discussion   . Stroke-like symptom   . Encounter for palliative care   . Right upper quadrant abdominal pain   . Abdominal pain, right upper quadrant   . Pulmonary embolism without acute cor pulmonale (HCC) 07/21/2016  . Hypothyroidism 07/21/2016  . Gallbladder mass 07/21/2016  . Liver metastases (HCC) 07/21/2016  . Liver mass   . MGUS (monoclonal gammopathy of unknown significance) 11/18/2015  . PMR (polymyalgia rheumatica) (HCC) 05/15/2014  . Depression 04/19/2011  . Essential hypertension 12/27/2006    Palliative Care Assessment & Plan   Patient Profile:  HPI: 80-year-old woman with a past medical history significant for chronic pain, history of polyp magic rheumatica, MGUS for which she was following with hematology earlier this year. Medical history also pertinent for several hospitalizations earlier this year for urinary tract infection, possible arterial disease/thrombosis.   Patient has been admitted on 07/19/16 because of right-sided abdominal discomfort. Pertinent acute medical issues being addressed in this hospitalization include concern for metastatic malignancy, elevated CEA and CA-19-9 levels, acute pulmonary embolism in the right middle and right lower lobe, anxiety, uncontrolled pain and discomfort. Liver biopsy preformed on 12/1, with results showing a metastatic poorly differentiated carcinoma (likley pancreatobiliary/upper GI primary, though cholangiocarcinoma/hepatocellular carcinoma cannot be completely excluded). There had been concern for a TIA on 12/4, however MRI was negative for stroke.   Assessment: Pt has a new metastatic malignancy, confirmed by biopsy. This was discussed with her and her  daughter yesterday evening, and they have opted to go home with Hospice support.   Symptomatically, [name]'s pain has   dramatically improved with medications. She feels her pain is worse at night. The higher dose of MS Contin has also made her feel very lethargic and slightly nauseated, which is why she hasn't taken it since 12/6. Despite missing these doses, her pain has been under okay control (has been utilizing more PRNs at night and can feel her pain "creeping up"). She tolerated Roxanol without side effects.   Recommendations/Plan:  Plan for discharge home today with Hospice support; it seems [facility] has already been contacted and is standing by for discharge; call placed to CM as pt will need transportation home  For symptom support, on discharge I would recommend scripts for:  MS Contin 73m in the morning and MS Contin 366mqHS  Morphine concentrate 1053m.5mL: 10-79m29m q4H PRN for pain  Zofran ODT 4mg 58m PRN for nausea  Senna tablet 8.6mg 161mb daily   Code Status:    Code Status Orders        Start     Ordered   07/19/16 1724  Full code  Continuous     07/19/16 1723    Code Status History    Date Active Date Inactive Code Status Order ID Comments User Context   06/17/2016  8:11 AM 06/24/2016  5:52 PM Full Code [id number] ParrD       Prognosis:   <6 months  Discharge Planning:  Home with Hospice  Care plan was discussed with  Patient and Kay (dZigmund Danielussed over phone)   Thank you for allowing the Palliative Medicine Team to assist in the care of this patient.   Time In:  0730 Time Out: 0800 Total Time  30 min  Prolonged Time Billed  no       Greater than 50%  of this time was spent counseling and coordinating care related to the above assessment and plan.  Ashtyn Freilich [facility]  Please contact Palliative Medicine Team phone at [phone] and concerns.

## 2016-07-29 ENCOUNTER — Telehealth: Payer: Self-pay

## 2016-07-29 NOTE — Telephone Encounter (Signed)
Please stick with what is in her discharge summary -thanks

## 2016-07-29 NOTE — Telephone Encounter (Signed)
Ria Comment nurse with [facility] left v/m to clarify the instructions for pts metoprolol; the instructions may be different from what pt was taking prior to being in the hospital. On med list metoprolol succinate 50 mg 24 hr tab taking 3 tabs by mouth daily # 90 on 06/24/16 by hospitalist Dr Marzetta Board.Please advise.

## 2016-07-30 NOTE — Telephone Encounter (Signed)
Spoke to [place] and advised per Dr Glori Bickers

## 2016-07-30 NOTE — Telephone Encounter (Signed)
Ria Comment with [facility] left v/m after taking with [facility] CMA. Pt has been taking metoprolol tartrate 25 mg taking one tab bid; BP on 07/28/16 was 128/78 and now it is 126/66. Ria Comment wants to know if Dr Glori Bickers wants pt to change to metoprolol succinate 50 mg. If pt is to change to metoprolol succinate she will need new rx and Ria Comment request cb with how to instruct family.

## 2016-07-30 NOTE — Telephone Encounter (Signed)
Sounds like her bp is good - if her pulse is ok we can stick to what she was on - it was ordered in the hospital for rate control, but I do not want to run her bp too low

## 2016-08-02 NOTE — Telephone Encounter (Signed)
Left voicemail requesting Ria Comment (hospice) to call the office back (I need to check on pt's pulse and if need be send in a new Rx)

## 2016-08-02 NOTE — Addendum Note (Signed)
Addended by: Tammi Sou on: 08/02/2016 11:46 AM   Modules accepted: Orders

## 2016-08-03 LAB — VAS US CAROTID
LCCADDIAS: 18 cm/s
LCCADSYS: 67 cm/s
LCCAPDIAS: 18 cm/s
LEFT ECA DIAS: 11 cm/s
LEFT VERTEBRAL DIAS: 24 cm/s
LICADSYS: -118 cm/s
Left CCA prox sys: 121 cm/s
Left ICA dist dias: -35 cm/s
Left ICA prox dias: 22 cm/s
Left ICA prox sys: 72 cm/s
RCCADSYS: -71 cm/s
RCCAPSYS: -71 cm/s
RIGHT ECA DIAS: 13 cm/s
RIGHT VERTEBRAL DIAS: 9 cm/s
Right CCA prox dias: -14 cm/s

## 2016-08-03 NOTE — Telephone Encounter (Signed)
Spoke with Ria Comment and pt's BP and pulse have been stable pulse has been running in low 80s-70s so she will keep her on the metoprolol tartrate 25 mg BID, and pt has plenty of that med so no Rx needed (med list updated

## 2016-08-03 NOTE — Addendum Note (Signed)
Addended by: Tammi Sou on: 08/03/2016 09:23 AM   Modules accepted: Orders

## 2016-08-06 ENCOUNTER — Telehealth: Payer: Self-pay

## 2016-08-06 ENCOUNTER — Other Ambulatory Visit: Payer: Self-pay

## 2016-08-06 MED ORDER — PANTOPRAZOLE SODIUM 40 MG PO TBEC: 40.0000 mg | DELAYED_RELEASE_TABLET | Freq: Every day | ORAL | 11 refills | Status: AC

## 2016-08-06 MED ORDER — MORPHINE SULFATE (CONCENTRATE) 10 MG/0.5ML PO SOLN: 10.0000 mg | ORAL | 0 refills | Status: DC | PRN

## 2016-08-06 MED ORDER — RANITIDINE HCL 150 MG PO CAPS: 150.0000 mg | ORAL_CAPSULE | Freq: Two times a day (BID) | ORAL | 11 refills | Status: AC

## 2016-08-06 NOTE — Telephone Encounter (Signed)
I sent it  

## 2016-08-06 NOTE — Telephone Encounter (Signed)
Ria Comment with [facility] left v/m; pt taking protonix bid for indigestion pain; pt complaining with frequent indigestion pain.Ria Comment request med sent to [facility],. Ria Comment request cb.

## 2016-08-06 NOTE — Telephone Encounter (Signed)
Ria Comment said that pt is already taking protonix and she is still having indigestion issues, Ria Comment is requesting another med she can take instead of the protonix or with the protonix, pt needs something stronger

## 2016-08-06 NOTE — Telephone Encounter (Signed)
Ria Comment with [facility] left v/m requesting rx morphine solution to [facility]. Last refilled # 30 ml on 07/28/16 by Dr Wyline Copas.

## 2016-08-06 NOTE — Telephone Encounter (Signed)
Rx faxed and Ria Comment with Hospice notified

## 2016-08-06 NOTE — Telephone Encounter (Signed)
I sent ranitidine to take in addn to the protonix Keep me posted

## 2016-08-06 NOTE — Telephone Encounter (Signed)
Printed to fax  

## 2016-08-06 NOTE — Telephone Encounter (Signed)
Ria Comment notified Rx sent to pharmacy

## 2016-08-20 ENCOUNTER — Other Ambulatory Visit: Payer: Self-pay

## 2016-08-20 MED ORDER — MORPHINE SULFATE (CONCENTRATE) 10 MG/0.5ML PO SOLN: 10.0000 mg | ORAL | 0 refills | Status: AC | PRN

## 2016-08-20 NOTE — Telephone Encounter (Signed)
[name] with [facility] left v.m requesting liquid morphine refilled today. Hospice pt. Last printed # 48ml on 08/06/16; pt last seen 06/11/16.

## 2016-08-20 NOTE — Telephone Encounter (Signed)
Prescription faxed to [facility],, [place].

## 2016-08-20 NOTE — Telephone Encounter (Signed)
MS printed for hospice

## 2016-08-24 ENCOUNTER — Telehealth: Payer: Self-pay | Admitting: Family Medicine

## 2016-08-24 NOTE — Telephone Encounter (Signed)
[facility] called to inform PCP patient has been under hospice care for one month.  Tomorrow morning 08/25/2016 they will be moving her to [facility] for End of life care. Called back number for Hospice is [phone].

## 2016-08-24 NOTE — Telephone Encounter (Signed)
Thanks so much for letting me know and alert me if they need anything

## 2016-08-30 ENCOUNTER — Telehealth: Payer: Self-pay | Admitting: Family Medicine

## 2016-08-30 NOTE — Telephone Encounter (Signed)
Just received fax that pt passed away on 20-Sep-2022 Spoke to her husband Jenny Reichmann- he and family are doing ok  Condolences offered-we will miss her   He told me that hospice did a good job caring for her nad keeping her out of pain as well

## 2016-09-23 DEATH — deceased

## 2016-09-29 ENCOUNTER — Other Ambulatory Visit: Payer: Self-pay | Admitting: Family Medicine

## 2016-10-14 ENCOUNTER — Ambulatory Visit: Payer: 59 | Admitting: Cardiology

## 2016-10-25 ENCOUNTER — Ambulatory Visit: Payer: 59 | Admitting: Cardiology

## 2018-03-15 IMAGING — CT CT ANGIO CHEST
2 of 6 series · 18 of 36 positions shown · IV contrast (Omni 300)
Comparison: None.

CLINICAL DATA: Positive D-dimer. History of hypertension and
arrhythmias.

EXAM:
CT ANGIOGRAPHY CHEST WITH CONTRAST
TECHNIQUE: Multidetector CT imaging of the chest was performed using the
standard protocol during bolus administration of intravenous
contrast. Multiplanar CT image reconstructions and MIPs were
obtained to evaluate the vascular anatomy.
CONTRAST:  100 mL Isovue 370

[Series 8: pe thins · axial · 0.80mm/px · z∈[+1196,+1463]mm · 17 of 301 slices shown]
[im 17/301  lung]
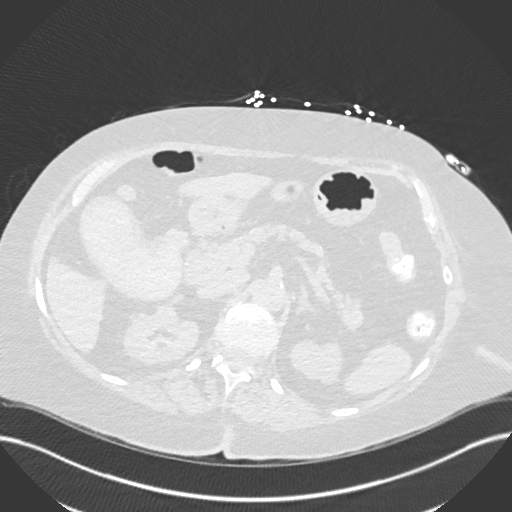
[im 34/301  mediastinal]
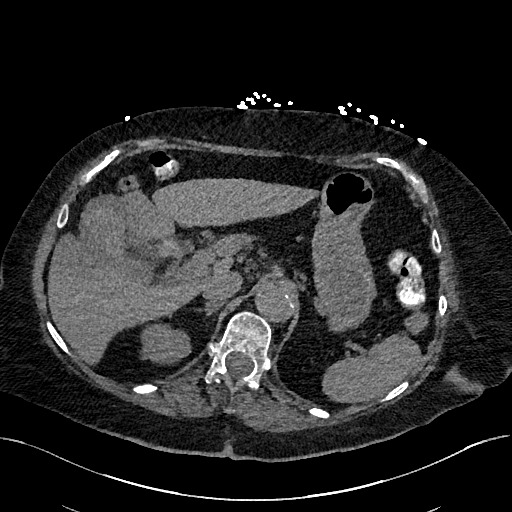
[im 51/301  lung]
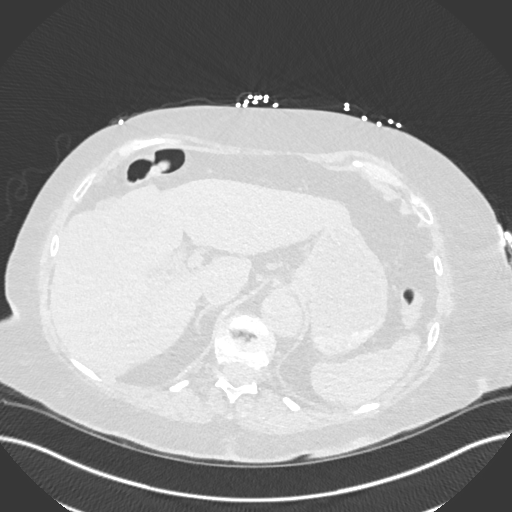
[im 67/301  mediastinal]
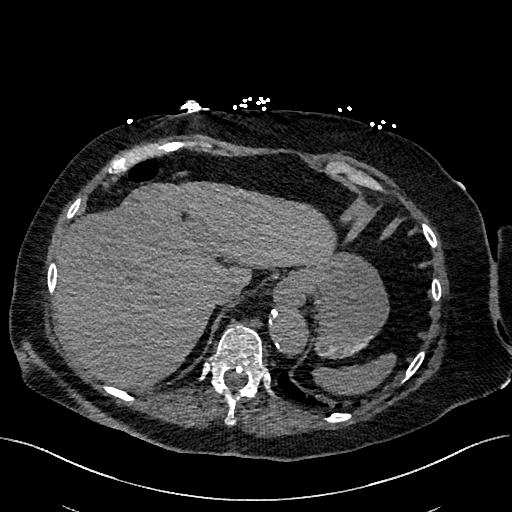
[im 84/301  lung]
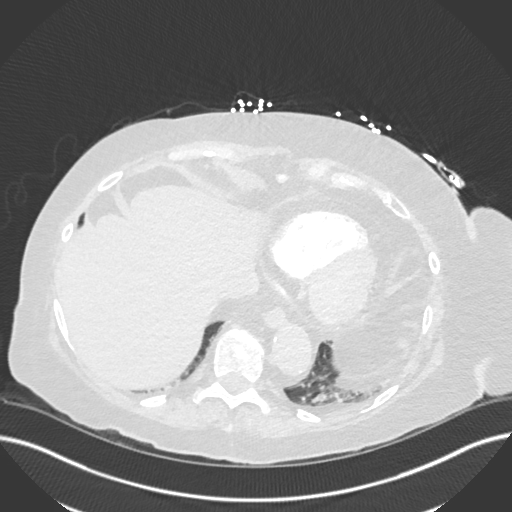
[im 101/301  mediastinal]
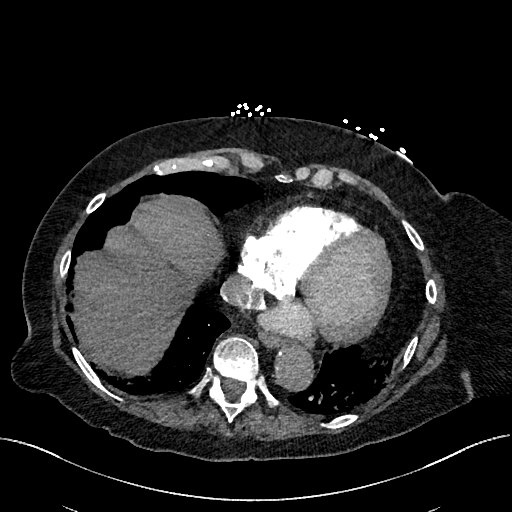
[im 117/301  lung]
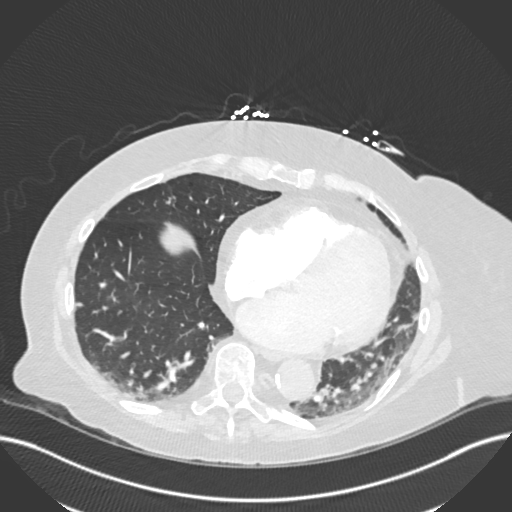
[im 134/301  mediastinal]
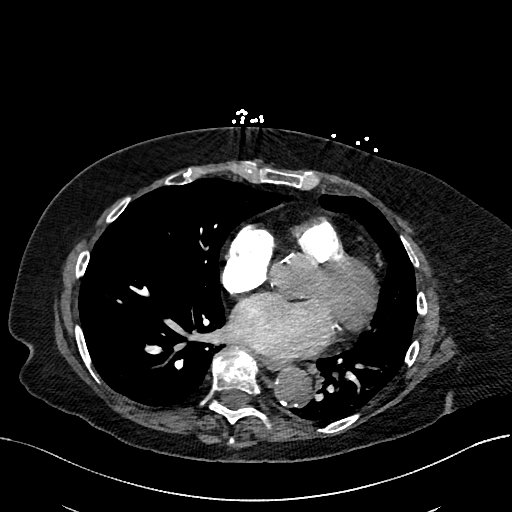
[im 151/301  lung]
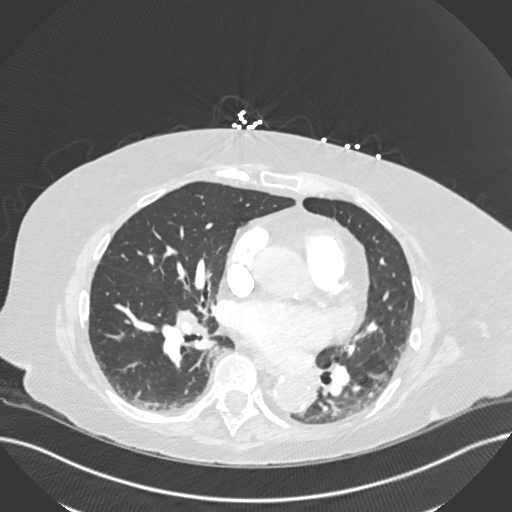
[im 167/301  mediastinal]
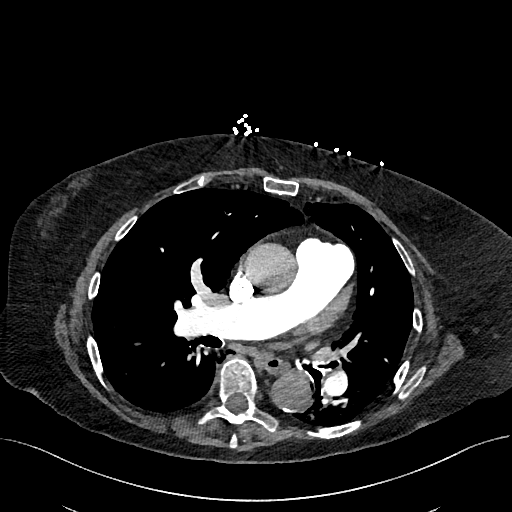
[im 184/301  lung]
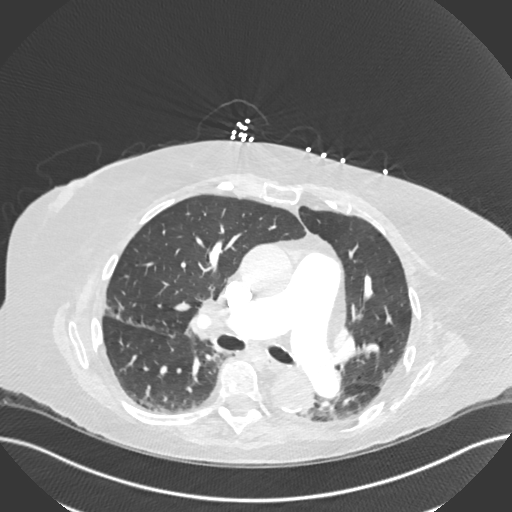
[im 201/301  mediastinal]
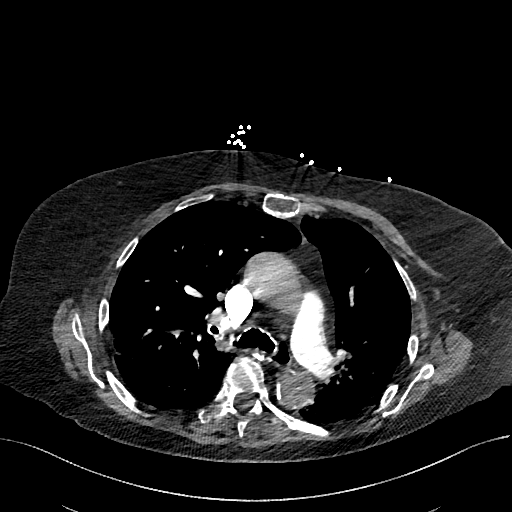
[im 217/301  lung]
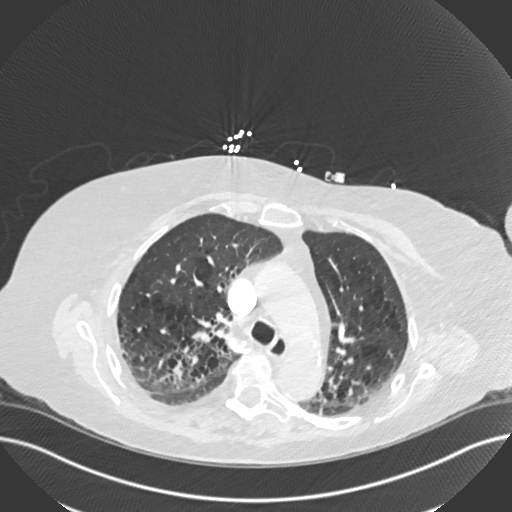
[im 234/301  mediastinal]
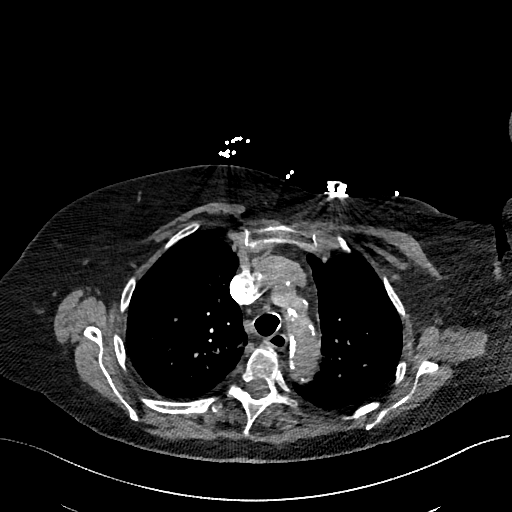
[im 251/301  lung]
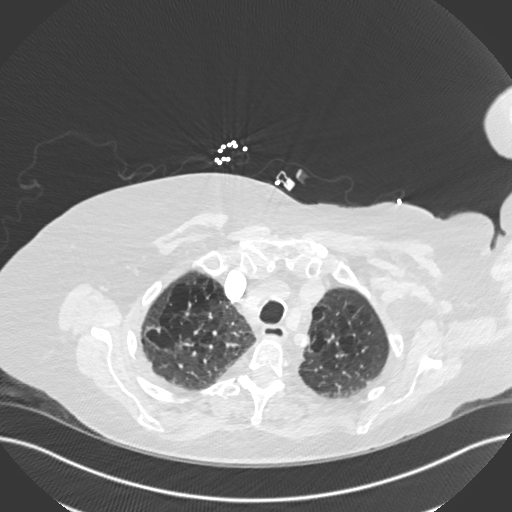
[im 267/301  mediastinal]
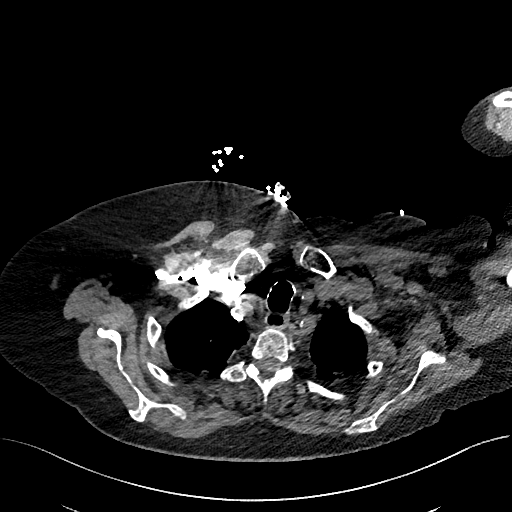
[im 284/301  lung]
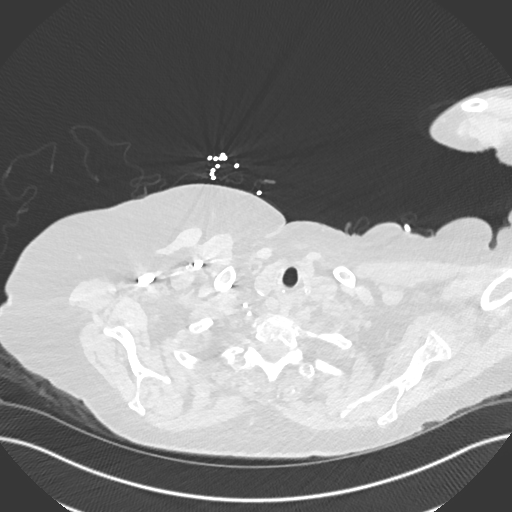

[Series 9: pe 2mm cor · coronal · 0.60mm/px · 1 of 125 slices shown]
[im 63/125  mediastinal]
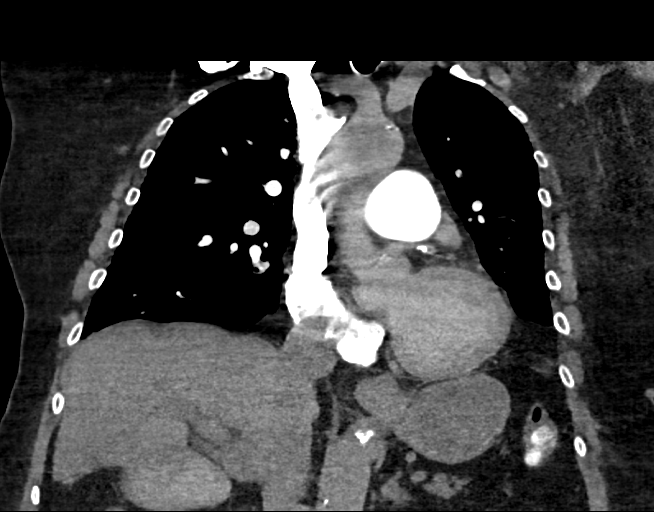

[18 of 36 positions shown; findings below may reference images not displayed]

FINDINGS: Cardiovascular: Examination of the pulmonary arteries is somewhat
limited by contrast bolus and motion artifact but there is no
definite evidence of significant pulmonary embolus. Normal heart
size. No pericardial effusion. Calcification of the aorta and
coronary arteries.

Mediastinum/Nodes: No enlarged mediastinal, hilar, or axillary lymph
nodes. Thyroid gland, trachea, and esophagus demonstrate no
significant findings.

Lungs/Pleura: Atelectasis in the lung bases. Emphysematous changes
particularly in the upper lungs. Slight fibrosis. No focal
consolidation. No pleural effusions. No pneumothorax.

Upper Abdomen: The appearance of the liver suggests hepatic
cirrhosis. The lateral segment of the left lobe is enlarged and
there appears to be mild lobulation to the contour. The gallbladder
is distended and filled with mildly hyperechoic material, likely
representing sludge although small stones or milk of calcium could
also have this appearance. No gallbladder wall thickening. No bile
duct dilatation.

Musculoskeletal: No chest wall abnormality. No acute or significant
osseous findings.

Review of the MIP images confirms the above findings.
IMPRESSION: No evidence of significant pulmonary embolus. Emphysematous changes,
fibrosis, and atelectasis in the lungs. Aortic atherosclerosis.
